# Patient Record
Sex: Male | Born: 1950
Health system: Southern US, Community
[De-identification: ages and names within clinical notes are randomized; demographics above are authoritative.]

## PROBLEM LIST (undated history)

## (undated) DIAGNOSIS — G4733 Obstructive sleep apnea (adult) (pediatric): Secondary | ICD-10-CM

## (undated) DIAGNOSIS — R001 Bradycardia, unspecified: Secondary | ICD-10-CM

## (undated) DIAGNOSIS — E669 Obesity, unspecified: Secondary | ICD-10-CM

## (undated) DIAGNOSIS — M545 Low back pain, unspecified: Secondary | ICD-10-CM

## (undated) DIAGNOSIS — H538 Other visual disturbances: Secondary | ICD-10-CM

## (undated) DIAGNOSIS — N529 Male erectile dysfunction, unspecified: Secondary | ICD-10-CM

## (undated) DIAGNOSIS — Z860100 Personal history of colon polyps, unspecified: Secondary | ICD-10-CM

## (undated) DIAGNOSIS — IMO0002 Reserved for concepts with insufficient information to code with codable children: Secondary | ICD-10-CM

## (undated) DIAGNOSIS — L989 Disorder of the skin and subcutaneous tissue, unspecified: Secondary | ICD-10-CM

## (undated) DIAGNOSIS — K5730 Diverticulosis of large intestine without perforation or abscess without bleeding: Secondary | ICD-10-CM

## (undated) DIAGNOSIS — G629 Polyneuropathy, unspecified: Secondary | ICD-10-CM

## (undated) DIAGNOSIS — N400 Enlarged prostate without lower urinary tract symptoms: Secondary | ICD-10-CM

## (undated) DIAGNOSIS — F419 Anxiety disorder, unspecified: Secondary | ICD-10-CM

## (undated) DIAGNOSIS — I2510 Atherosclerotic heart disease of native coronary artery without angina pectoris: Secondary | ICD-10-CM

## (undated) DIAGNOSIS — E785 Hyperlipidemia, unspecified: Secondary | ICD-10-CM

## (undated) DIAGNOSIS — I639 Cerebral infarction, unspecified: Secondary | ICD-10-CM

## (undated) DIAGNOSIS — Z720 Tobacco use: Secondary | ICD-10-CM

## (undated) DIAGNOSIS — M1990 Unspecified osteoarthritis, unspecified site: Secondary | ICD-10-CM

## (undated) DIAGNOSIS — I10 Essential (primary) hypertension: Secondary | ICD-10-CM

## (undated) DIAGNOSIS — G4730 Sleep apnea, unspecified: Secondary | ICD-10-CM

## (undated) DIAGNOSIS — R911 Solitary pulmonary nodule: Secondary | ICD-10-CM

## (undated) DIAGNOSIS — R9430 Abnormal result of cardiovascular function study, unspecified: Secondary | ICD-10-CM

## (undated) DIAGNOSIS — T7840XA Allergy, unspecified, initial encounter: Secondary | ICD-10-CM

## (undated) DIAGNOSIS — I4892 Unspecified atrial flutter: Secondary | ICD-10-CM

## (undated) DIAGNOSIS — I480 Paroxysmal atrial fibrillation: Secondary | ICD-10-CM

## (undated) DIAGNOSIS — F32A Depression, unspecified: Secondary | ICD-10-CM

## (undated) DIAGNOSIS — K219 Gastro-esophageal reflux disease without esophagitis: Secondary | ICD-10-CM

## (undated) DIAGNOSIS — F329 Major depressive disorder, single episode, unspecified: Secondary | ICD-10-CM

## (undated) DIAGNOSIS — R7303 Prediabetes: Secondary | ICD-10-CM

## (undated) DIAGNOSIS — E538 Deficiency of other specified B group vitamins: Secondary | ICD-10-CM

## (undated) HISTORY — DX: Unspecified atrial flutter: I48.92

## (undated) HISTORY — DX: Benign prostatic hyperplasia without lower urinary tract symptoms: N40.0

## (undated) HISTORY — DX: Low back pain: M54.5

## (undated) HISTORY — DX: Allergy, unspecified, initial encounter: T78.40XA

## (undated) HISTORY — DX: Atherosclerotic heart disease of native coronary artery without angina pectoris: I25.10

## (undated) HISTORY — DX: Personal history of colon polyps, unspecified: Z86.0100

## (undated) HISTORY — DX: Male erectile dysfunction, unspecified: N52.9

## (undated) HISTORY — PX: TONSILLECTOMY: SUR1361

## (undated) HISTORY — DX: Tobacco use: Z72.0

## (undated) HISTORY — DX: Paroxysmal atrial fibrillation: I48.0

## (undated) HISTORY — DX: Personal history of colonic polyps: Z86.010

## (undated) HISTORY — DX: Polyneuropathy, unspecified: G62.9

## (undated) HISTORY — DX: Major depressive disorder, single episode, unspecified: F32.9

## (undated) HISTORY — DX: Prediabetes: R73.03

## (undated) HISTORY — DX: Sleep apnea, unspecified: G47.30

## (undated) HISTORY — DX: Cerebral infarction, unspecified: I63.9

## (undated) HISTORY — DX: Disorder of the skin and subcutaneous tissue, unspecified: L98.9

## (undated) HISTORY — PX: OTHER SURGICAL HISTORY: SHX169

## (undated) HISTORY — DX: Abnormal result of cardiovascular function study, unspecified: R94.30

## (undated) HISTORY — DX: Deficiency of other specified B group vitamins: E53.8

## (undated) HISTORY — DX: Essential (primary) hypertension: I10

## (undated) HISTORY — DX: Bradycardia, unspecified: R00.1

## (undated) HISTORY — DX: Hyperlipidemia, unspecified: E78.5

## (undated) HISTORY — DX: Other visual disturbances: H53.8

## (undated) HISTORY — DX: Obesity, unspecified: E66.9

## (undated) HISTORY — PX: CLAVICLE SURGERY: SHX598

## (undated) HISTORY — DX: Reserved for concepts with insufficient information to code with codable children: IMO0002

## (undated) HISTORY — DX: Diverticulosis of large intestine without perforation or abscess without bleeding: K57.30

## (undated) HISTORY — DX: Gastro-esophageal reflux disease without esophagitis: K21.9

## (undated) HISTORY — DX: Unspecified osteoarthritis, unspecified site: M19.90

## (undated) HISTORY — DX: Low back pain, unspecified: M54.50

## (undated) HISTORY — DX: Obstructive sleep apnea (adult) (pediatric): G47.33

## (undated) HISTORY — DX: Anxiety disorder, unspecified: F41.9

## (undated) HISTORY — DX: Solitary pulmonary nodule: R91.1

## (undated) HISTORY — DX: Depression, unspecified: F32.A

---

## 1999-08-07 ENCOUNTER — Encounter: Payer: Self-pay | Admitting: Orthopedic Surgery

## 1999-08-07 ENCOUNTER — Ambulatory Visit (HOSPITAL_COMMUNITY): Admission: RE | Admit: 1999-08-07 | Discharge: 1999-08-07 | Payer: Self-pay | Admitting: Otolaryngology

## 1999-08-07 ENCOUNTER — Encounter: Payer: Self-pay | Admitting: Otolaryngology

## 1999-08-11 ENCOUNTER — Observation Stay (HOSPITAL_COMMUNITY): Admission: RE | Admit: 1999-08-11 | Discharge: 1999-08-12 | Payer: Self-pay | Admitting: Orthopedic Surgery

## 1999-08-11 ENCOUNTER — Encounter: Payer: Self-pay | Admitting: Orthopedic Surgery

## 1999-09-03 ENCOUNTER — Ambulatory Visit (HOSPITAL_COMMUNITY): Admission: RE | Admit: 1999-09-03 | Discharge: 1999-09-03 | Payer: Self-pay | Admitting: Pulmonary Disease

## 1999-09-03 ENCOUNTER — Encounter: Payer: Self-pay | Admitting: Pulmonary Disease

## 1999-12-18 ENCOUNTER — Inpatient Hospital Stay (HOSPITAL_COMMUNITY): Admission: AD | Admit: 1999-12-18 | Discharge: 1999-12-20 | Payer: Self-pay | Admitting: *Deleted

## 2001-05-20 ENCOUNTER — Ambulatory Visit (HOSPITAL_COMMUNITY): Admission: RE | Admit: 2001-05-20 | Discharge: 2001-05-20 | Payer: Self-pay | Admitting: Cardiology

## 2004-06-26 ENCOUNTER — Ambulatory Visit: Payer: Self-pay | Admitting: Pulmonary Disease

## 2004-06-26 ENCOUNTER — Ambulatory Visit: Payer: Self-pay | Admitting: Cardiology

## 2004-07-02 ENCOUNTER — Ambulatory Visit: Payer: Self-pay | Admitting: Cardiology

## 2004-07-07 ENCOUNTER — Ambulatory Visit: Payer: Self-pay | Admitting: Gastroenterology

## 2004-07-14 ENCOUNTER — Ambulatory Visit: Payer: Self-pay | Admitting: Gastroenterology

## 2004-10-29 ENCOUNTER — Ambulatory Visit: Payer: Self-pay | Admitting: Pulmonary Disease

## 2005-06-29 ENCOUNTER — Ambulatory Visit: Payer: Self-pay | Admitting: Cardiology

## 2005-07-06 ENCOUNTER — Ambulatory Visit: Payer: Self-pay | Admitting: Cardiology

## 2005-12-22 ENCOUNTER — Ambulatory Visit: Payer: Self-pay | Admitting: Pulmonary Disease

## 2005-12-30 ENCOUNTER — Ambulatory Visit: Payer: Self-pay | Admitting: Cardiology

## 2005-12-31 ENCOUNTER — Ambulatory Visit: Payer: Self-pay

## 2006-04-09 ENCOUNTER — Ambulatory Visit: Payer: Self-pay | Admitting: Pulmonary Disease

## 2006-06-14 ENCOUNTER — Ambulatory Visit: Payer: Self-pay | Admitting: Cardiology

## 2006-06-21 ENCOUNTER — Ambulatory Visit: Payer: Self-pay

## 2006-06-22 ENCOUNTER — Ambulatory Visit: Payer: Self-pay

## 2006-06-23 ENCOUNTER — Ambulatory Visit: Payer: Self-pay | Admitting: Cardiology

## 2006-06-23 LAB — CONVERTED CEMR LAB
AST: 25 units/L (ref 0–37)
Bilirubin, Direct: 0.2 mg/dL (ref 0.0–0.3)
HDL: 33 mg/dL — ABNORMAL LOW (ref >39.0)
LDL Cholesterol: 87 mg/dL (ref 0–99)
Total Protein: 6.5 g/dL (ref 6.0–8.3)
Triglycerides: 170 mg/dL — ABNORMAL HIGH (ref 0–149)
VLDL: 34 mg/dL (ref 0–40)

## 2007-04-13 ENCOUNTER — Ambulatory Visit: Payer: Self-pay | Admitting: Cardiology

## 2007-04-13 LAB — CONVERTED CEMR LAB
Basophils Absolute: 0 10*3/uL (ref 0.0–0.1)
Chloride: 106 meq/L (ref 96–112)
Cholesterol: 188 mg/dL (ref 0–200)
Eosinophils Absolute: 0.2 10*3/uL (ref 0.0–0.6)
GFR calc Af Amer: 99 mL/min
GFR calc non Af Amer: 82 mL/min
Glucose, Bld: 97 mg/dL (ref 70–99)
HCT: 50.8 % (ref 39.0–52.0)
Lymphocytes Relative: 22 % (ref 12.0–46.0)
MCHC: 34.5 g/dL (ref 30.0–36.0)
MCV: 92.5 fL (ref 78.0–100.0)
Neutro Abs: 5.6 10*3/uL (ref 1.4–7.7)
Neutrophils Relative %: 67.4 % (ref 43.0–77.0)
Potassium: 4.3 meq/L (ref 3.5–5.1)
RBC: 5.5 M/uL (ref 4.22–5.81)
Sodium: 141 meq/L (ref 135–145)
TSH: 1.56 microintl units/mL (ref 0.35–5.50)
Total CHOL/HDL Ratio: 6
Triglycerides: 170 mg/dL — ABNORMAL HIGH (ref 0–149)

## 2007-04-15 ENCOUNTER — Ambulatory Visit: Payer: Self-pay | Admitting: Pulmonary Disease

## 2007-04-21 ENCOUNTER — Ambulatory Visit: Payer: Self-pay | Admitting: Cardiology

## 2007-05-04 ENCOUNTER — Ambulatory Visit: Payer: Self-pay

## 2007-05-04 ENCOUNTER — Encounter: Payer: Self-pay | Admitting: Cardiology

## 2007-05-05 ENCOUNTER — Ambulatory Visit: Payer: Self-pay | Admitting: Cardiology

## 2007-05-13 ENCOUNTER — Ambulatory Visit: Payer: Self-pay | Admitting: Cardiology

## 2007-07-08 DIAGNOSIS — F341 Dysthymic disorder: Secondary | ICD-10-CM | POA: Insufficient documentation

## 2007-07-21 ENCOUNTER — Ambulatory Visit: Payer: Self-pay | Admitting: Pulmonary Disease

## 2007-07-23 DIAGNOSIS — F17200 Nicotine dependence, unspecified, uncomplicated: Secondary | ICD-10-CM

## 2007-07-23 DIAGNOSIS — R911 Solitary pulmonary nodule: Secondary | ICD-10-CM

## 2007-07-23 DIAGNOSIS — K5730 Diverticulosis of large intestine without perforation or abscess without bleeding: Secondary | ICD-10-CM | POA: Insufficient documentation

## 2007-07-23 DIAGNOSIS — D126 Benign neoplasm of colon, unspecified: Secondary | ICD-10-CM | POA: Insufficient documentation

## 2007-07-23 DIAGNOSIS — E291 Testicular hypofunction: Secondary | ICD-10-CM

## 2007-08-31 ENCOUNTER — Ambulatory Visit: Payer: Self-pay | Admitting: Cardiology

## 2008-02-08 ENCOUNTER — Ambulatory Visit: Payer: Self-pay | Admitting: Pulmonary Disease

## 2008-02-09 ENCOUNTER — Ambulatory Visit: Payer: Self-pay | Admitting: Pulmonary Disease

## 2008-02-09 ENCOUNTER — Ambulatory Visit: Payer: Self-pay | Admitting: Cardiology

## 2008-02-09 DIAGNOSIS — F528 Other sexual dysfunction not due to a substance or known physiological condition: Secondary | ICD-10-CM

## 2008-02-09 DIAGNOSIS — N400 Enlarged prostate without lower urinary tract symptoms: Secondary | ICD-10-CM | POA: Insufficient documentation

## 2008-02-09 LAB — CONVERTED CEMR LAB
AST: 21 units/L (ref 0–37)
Albumin: 3.8 g/dL (ref 3.5–5.2)
BUN: 23 mg/dL (ref 6–23)
Basophils Absolute: 0 10*3/uL (ref 0.0–0.1)
Basophils Relative: 0.6 % (ref 0.0–3.0)
Calcium: 9.4 mg/dL (ref 8.4–10.5)
Creatinine, Ser: 1 mg/dL (ref 0.4–1.5)
Direct LDL: 143.3 mg/dL
Eosinophils Absolute: 0.2 10*3/uL (ref 0.0–0.7)
Eosinophils Relative: 3.4 % (ref 0.0–5.0)
GFR calc non Af Amer: 82 mL/min
HCT: 49.5 % (ref 39.0–52.0)
HDL: 27.7 mg/dL — ABNORMAL LOW (ref >39.0)
Hemoglobin: 17.5 g/dL — ABNORMAL HIGH (ref 13.0–17.0)
MCHC: 35.3 g/dL (ref 30.0–36.0)
MCV: 91.1 fL (ref 78.0–100.0)
Monocytes Absolute: 0.5 10*3/uL (ref 0.1–1.0)
Neutro Abs: 2.4 10*3/uL (ref 1.4–7.7)
PSA: 0.31 ng/mL (ref 0.10–4.00)
RBC: 5.43 M/uL (ref 4.22–5.81)
Total Bilirubin: 0.8 mg/dL (ref 0.3–1.2)
WBC: 4.9 10*3/uL (ref 4.5–10.5)

## 2008-02-20 ENCOUNTER — Telehealth (INDEPENDENT_AMBULATORY_CARE_PROVIDER_SITE_OTHER): Payer: Self-pay | Admitting: *Deleted

## 2008-04-25 ENCOUNTER — Telehealth: Payer: Self-pay | Admitting: Pulmonary Disease

## 2008-05-30 ENCOUNTER — Telehealth (INDEPENDENT_AMBULATORY_CARE_PROVIDER_SITE_OTHER): Payer: Self-pay | Admitting: *Deleted

## 2008-05-30 ENCOUNTER — Emergency Department (HOSPITAL_BASED_OUTPATIENT_CLINIC_OR_DEPARTMENT_OTHER): Admission: EM | Admit: 2008-05-30 | Discharge: 2008-05-30 | Payer: Self-pay | Admitting: Emergency Medicine

## 2008-05-30 ENCOUNTER — Ambulatory Visit: Payer: Self-pay | Admitting: Radiology

## 2008-06-05 ENCOUNTER — Encounter: Admission: RE | Admit: 2008-06-05 | Discharge: 2008-06-05 | Payer: Self-pay | Admitting: Pulmonary Disease

## 2008-06-05 ENCOUNTER — Ambulatory Visit: Payer: Self-pay | Admitting: Pulmonary Disease

## 2008-06-06 ENCOUNTER — Telehealth: Payer: Self-pay | Admitting: Pulmonary Disease

## 2008-06-06 ENCOUNTER — Ambulatory Visit: Payer: Self-pay | Admitting: Pulmonary Disease

## 2008-06-06 LAB — CONVERTED CEMR LAB
Cholesterol: 186 mg/dL (ref 0–200)
LDL Cholesterol: 123 mg/dL — ABNORMAL HIGH (ref 0–99)
Total CHOL/HDL Ratio: 5.9
Triglycerides: 161 mg/dL — ABNORMAL HIGH (ref 0–149)

## 2008-08-03 ENCOUNTER — Telehealth (INDEPENDENT_AMBULATORY_CARE_PROVIDER_SITE_OTHER): Payer: Self-pay | Admitting: *Deleted

## 2008-08-03 DIAGNOSIS — M545 Low back pain: Secondary | ICD-10-CM | POA: Insufficient documentation

## 2008-08-06 ENCOUNTER — Ambulatory Visit: Payer: Self-pay | Admitting: Pulmonary Disease

## 2008-08-07 ENCOUNTER — Ambulatory Visit: Payer: Self-pay | Admitting: Pulmonary Disease

## 2008-08-09 ENCOUNTER — Encounter: Payer: Self-pay | Admitting: Pulmonary Disease

## 2008-08-23 ENCOUNTER — Encounter: Payer: Self-pay | Admitting: Cardiology

## 2008-08-23 ENCOUNTER — Ambulatory Visit: Payer: Self-pay | Admitting: Cardiology

## 2008-08-24 ENCOUNTER — Encounter: Payer: Self-pay | Admitting: Pulmonary Disease

## 2008-08-27 ENCOUNTER — Encounter: Payer: Self-pay | Admitting: Pulmonary Disease

## 2008-08-27 ENCOUNTER — Ambulatory Visit: Payer: Self-pay | Admitting: Cardiology

## 2008-09-03 ENCOUNTER — Ambulatory Visit: Payer: Self-pay | Admitting: Cardiology

## 2008-09-10 ENCOUNTER — Ambulatory Visit: Payer: Self-pay | Admitting: Cardiology

## 2008-09-10 ENCOUNTER — Encounter: Payer: Self-pay | Admitting: Cardiology

## 2008-09-10 DIAGNOSIS — G629 Polyneuropathy, unspecified: Secondary | ICD-10-CM

## 2008-09-10 DIAGNOSIS — R21 Rash and other nonspecific skin eruption: Secondary | ICD-10-CM | POA: Insufficient documentation

## 2008-09-26 ENCOUNTER — Ambulatory Visit: Payer: Self-pay | Admitting: Internal Medicine

## 2008-10-10 ENCOUNTER — Ambulatory Visit: Payer: Self-pay | Admitting: Cardiovascular Disease

## 2008-10-29 ENCOUNTER — Ambulatory Visit: Payer: Self-pay | Admitting: Cardiology

## 2008-11-06 ENCOUNTER — Encounter: Payer: Self-pay | Admitting: *Deleted

## 2008-11-06 ENCOUNTER — Ambulatory Visit: Payer: Self-pay | Admitting: Pulmonary Disease

## 2008-11-06 LAB — CONVERTED CEMR LAB
AST: 25 units/L (ref 0–37)
BUN: 25 mg/dL — ABNORMAL HIGH (ref 6–23)
Basophils Absolute: 0 10*3/uL (ref 0.0–0.1)
Bilirubin, Direct: 0.2 mg/dL (ref 0.0–0.3)
Calcium: 9.3 mg/dL (ref 8.4–10.5)
Cholesterol: 185 mg/dL (ref 0–200)
Creatinine, Ser: 1 mg/dL (ref 0.4–1.5)
Direct LDL: 118.4 mg/dL
GFR calc non Af Amer: 81.09 mL/min (ref >60)
Glucose, Bld: 92 mg/dL (ref 70–99)
HCT: 45.9 % (ref 39.0–52.0)
HDL: 31.9 mg/dL — ABNORMAL LOW (ref >39.00)
Lymphocytes Relative: 31.3 % (ref 12.0–46.0)
Lymphs Abs: 1.7 10*3/uL (ref 0.7–4.0)
Monocytes Relative: 6.8 % (ref 3.0–12.0)
Neutrophils Relative %: 58.2 % (ref 43.0–77.0)
PSA: 0.29 ng/mL (ref 0.10–4.00)
Platelets: 219 10*3/uL (ref 150.0–400.0)
RDW: 12.2 % (ref 11.5–14.6)
TSH: 0.98 microintl units/mL (ref 0.35–5.50)
Total Bilirubin: 1.2 mg/dL (ref 0.3–1.2)
Triglycerides: 253 mg/dL — ABNORMAL HIGH (ref 0.0–149.0)
VLDL: 50.6 mg/dL — ABNORMAL HIGH (ref 0.0–40.0)
WBC: 5.4 10*3/uL (ref 4.5–10.5)

## 2008-11-07 ENCOUNTER — Ambulatory Visit: Payer: Self-pay | Admitting: Cardiology

## 2008-11-07 LAB — CONVERTED CEMR LAB: Protime: 20.6

## 2008-11-12 LAB — CONVERTED CEMR LAB: Vit D, 25-Hydroxy: 18 ng/mL — ABNORMAL LOW (ref 30–89)

## 2008-11-15 ENCOUNTER — Telehealth (INDEPENDENT_AMBULATORY_CARE_PROVIDER_SITE_OTHER): Payer: Self-pay | Admitting: Radiology

## 2008-11-16 ENCOUNTER — Encounter: Payer: Self-pay | Admitting: Cardiology

## 2008-11-19 ENCOUNTER — Encounter: Payer: Self-pay | Admitting: Cardiology

## 2008-11-19 ENCOUNTER — Ambulatory Visit: Payer: Self-pay

## 2008-12-07 ENCOUNTER — Ambulatory Visit: Payer: Self-pay | Admitting: Cardiology

## 2008-12-12 ENCOUNTER — Encounter: Payer: Self-pay | Admitting: *Deleted

## 2008-12-20 ENCOUNTER — Encounter: Payer: Self-pay | Admitting: Cardiology

## 2008-12-20 ENCOUNTER — Encounter: Payer: Self-pay | Admitting: Pulmonary Disease

## 2009-01-04 ENCOUNTER — Encounter (INDEPENDENT_AMBULATORY_CARE_PROVIDER_SITE_OTHER): Payer: Self-pay | Admitting: Cardiology

## 2009-01-04 ENCOUNTER — Ambulatory Visit: Payer: Self-pay | Admitting: Cardiology

## 2009-01-04 LAB — CONVERTED CEMR LAB: POC INR: 2.9

## 2009-01-31 ENCOUNTER — Ambulatory Visit: Payer: Self-pay | Admitting: Cardiovascular Disease

## 2009-01-31 LAB — CONVERTED CEMR LAB: POC INR: 2.6

## 2009-02-28 ENCOUNTER — Ambulatory Visit: Payer: Self-pay | Admitting: Internal Medicine

## 2009-03-28 ENCOUNTER — Ambulatory Visit: Payer: Self-pay | Admitting: Cardiovascular Disease

## 2009-03-28 LAB — CONVERTED CEMR LAB: POC INR: 2.5

## 2009-04-12 ENCOUNTER — Encounter: Payer: Self-pay | Admitting: Pulmonary Disease

## 2009-04-25 ENCOUNTER — Ambulatory Visit: Payer: Self-pay | Admitting: Cardiology

## 2009-04-25 LAB — CONVERTED CEMR LAB: POC INR: 2.2

## 2009-05-11 ENCOUNTER — Encounter: Payer: Self-pay | Admitting: Pulmonary Disease

## 2009-05-20 ENCOUNTER — Encounter: Payer: Self-pay | Admitting: Adult Health

## 2009-05-20 ENCOUNTER — Telehealth (INDEPENDENT_AMBULATORY_CARE_PROVIDER_SITE_OTHER): Payer: Self-pay | Admitting: *Deleted

## 2009-05-20 ENCOUNTER — Ambulatory Visit: Payer: Self-pay | Admitting: Pulmonary Disease

## 2009-05-20 DIAGNOSIS — E559 Vitamin D deficiency, unspecified: Secondary | ICD-10-CM | POA: Insufficient documentation

## 2009-05-22 LAB — CONVERTED CEMR LAB
ALT: 24 U/L
AST: 22 U/L
Albumin: 4 g/dL
Alkaline Phosphatase: 47 U/L
Bilirubin, Direct: 0.1 mg/dL
Cholesterol: 183 mg/dL
Direct LDL: 111.8 mg/dL
HDL: 31.2 mg/dL — ABNORMAL LOW
Total Bilirubin: 1 mg/dL
Total CHOL/HDL Ratio: 6
Total Protein: 6.8 g/dL
Triglycerides: 248 mg/dL — ABNORMAL HIGH
VLDL: 49.6 mg/dL — ABNORMAL HIGH
Vit D, 25-Hydroxy: 22 ng/mL — ABNORMAL LOW

## 2009-05-23 ENCOUNTER — Encounter: Payer: Self-pay | Admitting: Cardiology

## 2009-05-24 ENCOUNTER — Ambulatory Visit: Payer: Self-pay | Admitting: Cardiology

## 2009-05-24 ENCOUNTER — Encounter (INDEPENDENT_AMBULATORY_CARE_PROVIDER_SITE_OTHER): Payer: Self-pay | Admitting: Cardiology

## 2009-06-12 ENCOUNTER — Encounter: Payer: Self-pay | Admitting: Cardiology

## 2009-06-21 ENCOUNTER — Telehealth: Payer: Self-pay | Admitting: Cardiology

## 2009-06-21 ENCOUNTER — Ambulatory Visit: Payer: Self-pay | Admitting: Cardiology

## 2009-07-12 ENCOUNTER — Ambulatory Visit: Payer: Self-pay | Admitting: Cardiovascular Disease

## 2009-07-17 ENCOUNTER — Encounter: Payer: Self-pay | Admitting: Pulmonary Disease

## 2009-07-26 ENCOUNTER — Ambulatory Visit: Payer: Self-pay | Admitting: Cardiology

## 2009-07-26 LAB — CONVERTED CEMR LAB: POC INR: 2.6

## 2009-07-30 ENCOUNTER — Telehealth: Payer: Self-pay | Admitting: Pulmonary Disease

## 2009-08-12 ENCOUNTER — Telehealth: Payer: Self-pay | Admitting: Pulmonary Disease

## 2009-08-13 ENCOUNTER — Ambulatory Visit: Payer: Self-pay | Admitting: Adult Health

## 2009-08-14 LAB — CONVERTED CEMR LAB: Vit D, 25-Hydroxy: 27 ng/mL — ABNORMAL LOW (ref 30–89)

## 2009-08-15 ENCOUNTER — Encounter: Payer: Self-pay | Admitting: Adult Health

## 2009-08-23 ENCOUNTER — Ambulatory Visit: Payer: Self-pay | Admitting: Cardiovascular Disease

## 2009-08-23 LAB — CONVERTED CEMR LAB: POC INR: 2.3

## 2009-09-20 ENCOUNTER — Ambulatory Visit: Payer: Self-pay | Admitting: Internal Medicine

## 2009-09-20 LAB — CONVERTED CEMR LAB: POC INR: 2.2

## 2009-10-18 ENCOUNTER — Ambulatory Visit: Payer: Self-pay | Admitting: Cardiology

## 2009-10-18 LAB — CONVERTED CEMR LAB: POC INR: 2

## 2009-11-08 ENCOUNTER — Ambulatory Visit: Payer: Self-pay | Admitting: Cardiology

## 2009-11-08 ENCOUNTER — Ambulatory Visit: Payer: Self-pay | Admitting: Cardiovascular Disease

## 2009-11-18 ENCOUNTER — Ambulatory Visit: Payer: Self-pay | Admitting: Pulmonary Disease

## 2009-12-03 ENCOUNTER — Ambulatory Visit: Payer: Self-pay | Admitting: Pulmonary Disease

## 2009-12-04 LAB — CONVERTED CEMR LAB
Total CHOL/HDL Ratio: 5
Vit D, 25-Hydroxy: 35 ng/mL (ref 30–89)
Vitamin B-12: 606 pg/mL (ref 211–911)

## 2009-12-06 ENCOUNTER — Ambulatory Visit: Payer: Self-pay | Admitting: Cardiology

## 2009-12-06 LAB — CONVERTED CEMR LAB: POC INR: 2.5

## 2010-01-03 ENCOUNTER — Ambulatory Visit: Payer: Self-pay | Admitting: Cardiology

## 2010-01-31 ENCOUNTER — Ambulatory Visit: Payer: Self-pay | Admitting: Cardiology

## 2010-02-28 ENCOUNTER — Ambulatory Visit: Payer: Self-pay | Admitting: Internal Medicine

## 2010-03-21 ENCOUNTER — Encounter: Payer: Self-pay | Admitting: Cardiology

## 2010-03-28 ENCOUNTER — Ambulatory Visit: Payer: Self-pay | Admitting: Internal Medicine

## 2010-03-28 LAB — CONVERTED CEMR LAB: POC INR: 1.8

## 2010-04-09 ENCOUNTER — Telehealth: Payer: Self-pay | Admitting: Cardiology

## 2010-04-22 ENCOUNTER — Telehealth (INDEPENDENT_AMBULATORY_CARE_PROVIDER_SITE_OTHER): Payer: Self-pay | Admitting: *Deleted

## 2010-04-25 ENCOUNTER — Ambulatory Visit: Payer: Self-pay | Admitting: Cardiology

## 2010-05-09 ENCOUNTER — Ambulatory Visit: Payer: Self-pay | Admitting: Cardiology

## 2010-05-12 ENCOUNTER — Encounter: Payer: Self-pay | Admitting: Cardiology

## 2010-05-16 ENCOUNTER — Telehealth: Payer: Self-pay | Admitting: Cardiology

## 2010-05-16 LAB — CONVERTED CEMR LAB
ALT: 21 units/L (ref 0–53)
Basophils Absolute: 0 10*3/uL (ref 0.0–0.1)
Basophils Relative: 0.8 % (ref 0.0–3.0)
Eosinophils Relative: 2.8 % (ref 0.0–5.0)
GFR calc non Af Amer: 68.67 mL/min (ref >60)
Glucose, Bld: 101 mg/dL — ABNORMAL HIGH (ref 70–99)
HCT: 46.5 % (ref 39.0–52.0)
HDL: 30.4 mg/dL — ABNORMAL LOW (ref >39.00)
Hemoglobin: 16.2 g/dL (ref 13.0–17.0)
Lymphs Abs: 1.8 10*3/uL (ref 0.7–4.0)
Monocytes Relative: 8.8 % (ref 3.0–12.0)
Neutro Abs: 3.4 10*3/uL (ref 1.4–7.7)
Potassium: 4.5 meq/L (ref 3.5–5.1)
RDW: 12.5 % (ref 11.5–14.6)
Sodium: 140 meq/L (ref 135–145)
Total Bilirubin: 0.8 mg/dL (ref 0.3–1.2)
Total CHOL/HDL Ratio: 6
Triglycerides: 242 mg/dL — ABNORMAL HIGH (ref 0.0–149.0)

## 2010-05-19 ENCOUNTER — Ambulatory Visit: Payer: Self-pay | Admitting: Pulmonary Disease

## 2010-05-20 LAB — CONVERTED CEMR LAB: Testosterone: 420.42 ng/dL (ref 350.00–890.00)

## 2010-05-23 ENCOUNTER — Ambulatory Visit: Payer: Self-pay | Admitting: Cardiology

## 2010-05-28 ENCOUNTER — Telehealth: Payer: Self-pay | Admitting: Pulmonary Disease

## 2010-06-20 ENCOUNTER — Ambulatory Visit: Admission: RE | Admit: 2010-06-20 | Discharge: 2010-06-20 | Payer: Self-pay | Source: Home / Self Care

## 2010-06-20 LAB — CONVERTED CEMR LAB: POC INR: 2.1

## 2010-06-29 ENCOUNTER — Encounter: Payer: Self-pay | Admitting: Pulmonary Disease

## 2010-07-06 LAB — CONVERTED CEMR LAB
AST: 25 units/L (ref 0–37)
Albumin: 4.2 g/dL (ref 3.5–5.2)
Alkaline Phosphatase: 48 units/L (ref 39–117)
Bilirubin, Direct: 0.1 mg/dL (ref 0.0–0.3)
Calcium: 10 mg/dL (ref 8.4–10.5)
GFR calc non Af Amer: 65.75 mL/min (ref >60)
HDL: 33 mg/dL — ABNORMAL LOW (ref >39.00)
Potassium: 5.5 meq/L — ABNORMAL HIGH (ref 3.5–5.1)
Sodium: 144 meq/L (ref 135–145)
Total CHOL/HDL Ratio: 5
VLDL: 29.2 mg/dL (ref 0.0–40.0)

## 2010-07-08 NOTE — Assessment & Plan Note (Signed)
Summary: 6 months/apc   Primary Care Provider:  Kriste Basque, MD  CC:  Yearly ROV & CPX....  History of Present Illness: 60 y/o WM here for a follow up visit... he has mult med problems as noted...    ~  Dec09:  [facility] w/ confusion episode- he said it was like he couldn't make a decision and it was concerning to him... he denied weakness, numbness, tingling, speech probs etc... the ER eval was totally neg- neg CT Brain, norm labs, norm physical per the ER doc... it was their opinion that he might have had a TIA and rec to start Plavix and f/u w/ me in the office... the pt refused to fill the Plavix as he didn't think this was a TIA- rather he thought related to stress- work, son-in-law died w/ lymphoma, wife w/ musc dystrophy variant, etc... we decided to get an MRI Brain- done 12/29 & showed min atrophy & sm vessel dis, no acute infarct... rec- continue ASA 325mg /d.   ~  Mar10:  he reports no further confusion episodes... now c/o LBP for 2months- no known trauma, but he's been walking for exercise, & recently lifted 50lb bag of sand w/ incr pain after that... LumbarXRay 3/1 showed mild scoliosis, DDD w/ osteophytes & facet degen changes... we called in SOMA 350Tid, HEAT, & VICODIN Tid Prn... he states pain sl better, requests referral to DrGraves- seen 3/10 & he agreed w/ the Soma/ Vicodin and added phys therapy- now improved.  ~  Jun10:  had f/u w/ [name] for Cards w/ incr episodes of PAF- COUMADIN started via [facility], and METOPROLOL & DGOXIN started as well...  f/u Myoview pending (never done)...  he also had an an appt w/ [name] for Neuro- he agreed w/ Rx and also started w/u for peipheral neuropathy... B12 level was borderline at 273 (211-911) and he has started taking B12 orally as supplement.   ~  November 18, 2009:  Yearly ROV & doing satis he says... notes some sinus congestion, drainage, no discoloration, blood, fever, HA, etc... we discussed Rx w/ Mucinex,  Saline, etc... he saw [facility]/11 & he was in [place] on EKG- remains on Coumadin etc w/ PAF... BP controlled on meds, but unfotunately he hasn't quit smoking, although he has lost 27# on diet... his Simva80 was switched to Crestor20 & due for f/u labs...    Current Problems:   CIGARETTE SMOKER (ICD-305.1) - still smokes  ~1/2 ppd... we have prev discussed smoking cessation many times but he is not motivated to quit... "I'm working on it".  PULMONARY NODULE (ICD-518.89) - CXR's have been normal... CTChest 12/02 w/ stable small nodules that are likely granulomas...   ~  CXR 9/09 showed stable- clear lungs and no nodules seen on plain film.  ~  CXR 6/11 showed COPD/ emphysema, no nodules, NAD...  HYPERTENSION (ICD-401.9) - controlled w/ TOPROL XL 50mg /d, & DILTIAZEM 180mg /d... BP= 112/72 and doing well.. denies HA, fatigue, visual changes, CP, dizziness, syncope, dyspnea, edema, etc...  CAD (ICD-414.00) - followed by [name] on ASA daily... seen 9/09- hx of SVT in 2001& PAF more recently... palpit and BP controlled w/ the Cardizem... had NuclearStressTest 1/08= normal without ischemia & EF=60%... cath 2001 w/ 3 vessel CAD- s/p PTCA/stent... he denies CP, change in SOB, edema, etc... exerc= yard, some walking prior to his back pain prob.  CARDIAC ARRHYTHMIA (ICD-427.9), & ATRIAL FIBRILLATION, PAROXYSMAL (ICD-427.31)- hx SVT and PAF in past... [name] indicated  that his [place] score wasn't high enough for Coumadin therapy prev, but it was started 3/10 w/ incr PAF & ?TIA.Marland Kitchen. now on METOPROLOL ER 50mg /d, DIGOXIN 0.25mg /d, COUMADIN via CC...  HYPERLIPIDEMIA (ICD-272.4) - now on CRESTOR 20mg /d, FENOFIBRATE 160mg /d, + "diet" therapy... prev on Zetia but stopped by Cards- "it doesn't help" & Simva80 stopped w/ new FDA guidelines.  ~  FLP 11/08 showed TChol 188, TG 170, HDL 31, LDL 123...  ~  FLP 9/09 showed TChol 224, TG 176, HDL 28, LDL 143... rec- incr Simvast 80/d & better diet...  ~  FLP 12/09 showed  TChol 186, TG 161, HDL 31, LDL 123... rec contin Simva80 + [id number] & get wt down.  ~  FLP 6/10 showed TChol 185, TG 253, HDL 32, LDL 118... rec> needs better diet + exercise, refer to Lipid Clinic.  ~  FLP at work 2/11 (on Simva80+[id number]) showed TChol 184, TG 304, HDL 33, LDL 90  ~  5/11: changed from Simva80 to CRESTOR 20mg /d...  OBESITY (ICD-278.00) - we discussed diet and exercise program in detail...  ~  9/09:  his weight is down 12# today to 322#.Marland Kitchen.   ~  weight 12/09 = 319#  ~  weight 3/10 = 315#  ~  weight 6/10 = 309#  ~  weight 6/11 = 282#... keep up the great work...  GERD - on OMEPRAZOLE 20mg /d... DIVERTICULOSIS OF COLON (ICD-562.10), & COLONIC POLYPS (ICD-211.3) - last colonoscopy was 2/06 by DrStark showing divertics, and several 2-82mm polyps (hyperplastic)... f/u planned 2014...  BENIGN PROSTATIC HYPERTROPHY, WITH OBSTRUCTION (ICD-600.01),  Hx of TESTOSTERONE DEFICIENCY (ICD-257.2),  & ERECTILE DYSFUNCTION (ICD-302.72) - followed by Urology- [name] & [name]...  BACK PAIN, LUMBAR (ICD-724.2) - Ortho eval by DrGraves w/ PT recommended & improved... he uses ETODOLAC 400mg Bid Prn, SOMA 350mg Tid Prn, & VICODIN Prn...  PERIPHERAL NEUROPATHY, LOWER EXTREMITY (ICD-355.8) - eval 3/10 by Autumn Patty w/ prob peripheral neuropathy found ("I'm clumsey, I fall")... labs 3/10 showed borderline B 12 level = 273 by [name] & Rx'd w/ Vit B 12 542mcg/d by mouth.  ~  labs here 6/10 showed B 12 level = 584  ANXIETY DEPRESSION (ICD-300.4) - on ALPRAZOLAM 0.5mg  Tid Prn...  ? of B12 DEFICIENCY (ICD-266.2) & VITAMIN D DEFICIENCY (ICD-268.9) - as noted Vit B12 was borderline per [name] 3/10 (273) and he was started on oral B12 supplement  ~559mcg/d... repeat B12 level 6/10 = 584, improved... Vit D level = 18 6/10 & pt rec to start Vit D supplement w/ repeat Vit D level = 27 in Mar11... OK to continue 1000 u daily...   Preventive Screening-Counseling & Management  Alcohol-Tobacco     Smoking Status:  current     Packs/Day: 1/2 ppd  Allergies: 1)  ! * Tussionex  Comments:  Nurse/Medical Assistant: The patient's medications and allergies were reviewed with the patient and were updated in the Medication and Allergy Lists.  Past History:  Past Medical History: CIGARETTE SMOKER (ICD-305.1) PULMONARY NODULE (ICD-518.89) HYPERTENSION (ICD-401.9) CAD (ICD-414.00) CARDIAC ARRHYTHMIA (ICD-427.9) ATRIAL FIBRILLATION, PAROXYSMAL (ICD-427.31) HYPERLIPIDEMIA (ICD-272.4) OBESITY (ICD-278.00) DIVERTICULOSIS OF COLON (ICD-562.10) COLONIC POLYPS (ICD-211.3) BENIGN PROSTATIC HYPERTROPHY, WITH OBSTRUCTION (ICD-600.01) Hx of TESTOSTERONE DEFICIENCY (ICD-257.2) ERECTILE DYSFUNCTION (ICD-302.72) BACK PAIN, LUMBAR (ICD-724.2) PERIPHERAL NEUROPATHY, LOWER EXTREMITY (ICD-355.8) ANXIETY DEPRESSION (ICD-300.4) ? of B12 DEFICIENCY (ICD-266.2) VITAMIN D DEFICIENCY (ICD-268.9)  EF 60%...echo..04/2007 Blurred vision.Marland Kitchenepisode with confusion...evaluated over time by Dr. Sandria Manly Vitamin D treatment B12 treatment  Past Surgical History: S/P Tonsillectomy S/P Collar Bone fx S/P Right Arm fx  Family  History: Reviewed history from 02/09/2008 and no changes required. mother deceased age 53 from stroke/alzheimers father deceased age 39 from stroke and heart failure 1 sibling alive age 26  hx of dementia,blood clots 1 sibling deceased age 92  hx of bil knee replacement  Social History: Reviewed history from 05/20/2009 and no changes required. current smoker---1/4 ppd exposed to second hand smoke exercise----yard work several times per week caffeine use:  2 cups per day etoh---1 or less per day married 3 step-childrenPacks/Day:  1/2 ppd  Review of Systems       The patient complains of dyspnea on exertion, back pain, joint pain, and muscle weakness.  The patient denies fever, chills, sweats, anorexia, fatigue, weakness, malaise, weight loss, sleep disorder, blurring, diplopia, eye irritation, eye  discharge, vision loss, eye pain, photophobia, earache, ear discharge, tinnitus, decreased hearing, nasal congestion, nosebleeds, sore throat, hoarseness, chest pain, palpitations, syncope, orthopnea, PND, peripheral edema, cough, dyspnea at rest, excessive sputum, hemoptysis, wheezing, pleurisy, nausea, vomiting, diarrhea, constipation, change in bowel habits, abdominal pain, melena, hematochezia, jaundice, gas/bloating, indigestion/heartburn, dysphagia, odynophagia, dysuria, hematuria, urinary frequency, urinary hesitancy, nocturia, incontinence, joint swelling, muscle cramps, stiffness, arthritis, sciatica, restless legs, leg pain at night, leg pain with exertion, rash, itching, dryness, suspicious lesions, paralysis, paresthesias, seizures, tremors, vertigo, transient blindness, frequent falls, frequent headaches, difficulty walking, depression, anxiety, memory loss, confusion, cold intolerance, heat intolerance, polydipsia, polyphagia, polyuria, unusual weight change, abnormal bruising, bleeding, enlarged lymph nodes, urticaria, allergic rash, hay fever, and recurrent infections.    Vital Signs:  Patient profile:   60 year old male Height:      78 inches Weight:      282 pounds BMI:     32.71 O2 Sat:      97 % on Room air Temp:     97.0 degrees F oral Pulse rate:   45 / minute BP sitting:   112 / 72  (right arm) Cuff size:   regular  Vitals Entered By: Randell Loop CMA (November 18, 2009 10:17 AM)  O2 Sat at Rest %:  97 O2 Flow:  Room air CC: Yearly ROV & CPX... Is Patient Diabetic? No Pain Assessment Patient in pain? no      Comments meds updated today with pt   Physical Exam  Additional Exam:  WD, Obese, 60 y/o WM in NAD... he is 6'6" tall and weighs 282lbs GENERAL:  Alert & oriented; pleasant & cooperative... HEENT:  [place]/AT, EOM-wnl, PERRLA, EACs-clear, TMs-wnl, NOSE-clear, THROAT-clear & wnl. NECK:  Supple w/ fairROM; no JVD; normal carotid impulses w/o bruits; no thyromegaly or  nodules palpated; no lymphadenopathy. CHEST:  Clear to P & A; without wheezes/ rales/ or rhonchi. HEART:  Regular Rhythm; gr 1/6 SEM without rubs or gallops heard... ABDOMEN:  Soft & nontender; normal bowel sounds; no organomegaly or masses detected. EXT: without deformities, mod arthritic changes; no varicose veins/ +venous insuffic/ tr edema.  NEURO:  CN's intact;  no focal neuro deficits x sl decr sensation in LE's & wide based gait... DERM:  No lesions noted; no rash etc...    CXR  Procedure date:  11/18/2009  Findings:      CHEST - 2 VIEW Comparison: 05/30/2008   Findings: Stable tiny bilateral lung nodules were reported on the 05/20/2001 CT scan.  No nodules are appreciated on this exam. Emphysematous changes in the upper lobes particularly on the left. Normal cardiomediastinal silhouette.   IMPRESSION: Stable chest. COPD/emphysema.   Read By:  Jonne Ply,  Judie Petit.D.  MISC. Report  Procedure date:  11/18/2009  Findings:      DATA REVIEWED:  CXR today... Labs from work 07/19/09... [name] OV 11/08/09... he will f/u in our LAB for FASTING Lipids and PSA...  SN   Impression & Recommendations:  Problem # 1:  CIGARETTE SMOKER (ICD-305.1) Must quit all smoking- discussed smoking cessation strategies and offered Chantix etc... pt refuses and states that he's working on it on his own... Orders: T-2 View CXR (71020TC)  Problem # 2:  PULMONARY NODULE (ICD-518.89) CXR is clear- no nodules, he has underlying COPD/ Emphysema and needs to quit all smoking ASAP!!! Orders: T-2 View CXR (71020TC)  Problem # 3:  HYPERTENSION (ICD-401.9) BP controlled on meds... His updated medication list for this problem includes:    Metoprolol Succinate 50 Mg Xr24h-tab (Metoprolol succinate) .Marland Kitchen... Take one tablet by mouth daily    Diltiazem Hcl Cr 180 Mg Cp24 (Diltiazem hcl) .Marland Kitchen... Take 1 tab by mouth once daily  Problem # 4:  CAD (ICD-414.00) Follwed by [name] & felt to be stable... he  still needs to address the risk factors of smoking, weight, exercise... His updated medication list for this problem includes:    Aspirin 81 Mg Tbec (Aspirin) .Marland Kitchen... Take one tablet by mouth daily    Metoprolol Succinate 50 Mg Xr24h-tab (Metoprolol succinate) .Marland Kitchen... Take one tablet by mouth daily    Diltiazem Hcl Cr 180 Mg Cp24 (Diltiazem hcl) .Marland Kitchen... Take 1 tab by mouth once daily  Problem # 5:  ATRIAL FIBRILLATION, PAROXYSMAL (ICD-427.31) Stable on meds and Coumadin per Cards/ [name]... His updated medication list for this problem includes:    Warfarin Sodium 5 Mg Tabs (Warfarin sodium) ..... Use as directed by anticoagulation clinic    Aspirin 81 Mg Tbec (Aspirin) .Marland Kitchen... Take one tablet by mouth daily    Metoprolol Succinate 50 Mg Xr24h-tab (Metoprolol succinate) .Marland Kitchen... Take one tablet by mouth daily    Diltiazem Hcl Cr 180 Mg Cp24 (Diltiazem hcl) .Marland Kitchen... Take 1 tab by mouth once daily    Digoxin 0.25 Mg Tabs (Digoxin) .Marland Kitchen... Take one tablet by mouth daily  Problem # 6:  HYPERLIPIDEMIA (ICD-272.4) Simva80 changed to Cres20 & f/u FLP pending... His updated medication list for this problem includes:    Crestor 20 Mg Tabs (Rosuvastatin calcium) .Marland Kitchen... Take one tablet by mouth daily.    Fenofibrate 160 Mg Tabs (Fenofibrate) .Marland Kitchen... Take 1 tab daily...  Problem # 7:  OBESITY (ICD-278.00) He has lost 28# on diet... keep up the good work!  Problem # 8:  BACK PAIN, LUMBAR (ICD-724.2) Follwed by Ortho- DrGraves... His updated medication list for this problem includes:    Aspirin 81 Mg Tbec (Aspirin) .Marland Kitchen... Take one tablet by mouth daily    Soma 350 Mg Tabs (Carisoprodol) .Marland Kitchen... Take 1 tablet by mouth three times a day - not to exceed 3/d...    Etodolac 400 Mg Tabs (Etodolac) .Marland Kitchen... Take 1 tab by mouth two times a day w/ food, as needed for arthritis pain...    Vicodin 5-500 Mg Tabs (Hydrocodone-acetaminophen) .Marland Kitchen... Take 1 tab by mouth every 6 hours as needed for pain- not to exceed 3/d...  Problem # 9:   ANXIETY DEPRESSION (ICD-300.4) Stable on Prn Alprazolam Rx...  Complete Medication List: 1)  Warfarin Sodium 5 Mg Tabs (Warfarin sodium) .... Use as directed by anticoagulation clinic 2)  Aspirin 81 Mg Tbec (Aspirin) .... Take one tablet by mouth daily 3)  Metoprolol Succinate 50 Mg Xr24h-tab (Metoprolol succinate) .... Take one  tablet by mouth daily 4)  Diltiazem Hcl Cr 180 Mg Cp24 (Diltiazem hcl) .... Take 1 tab by mouth once daily 5)  Digoxin 0.25 Mg Tabs (Digoxin) .... Take one tablet by mouth daily 6)  Crestor 20 Mg Tabs (Rosuvastatin calcium) .... Take one tablet by mouth daily. 7)  Fenofibrate 160 Mg Tabs (Fenofibrate) .... Take 1 tab daily.Marland KitchenMarland Kitchen 8)  Omeprazole 20 Mg Tbec (Omeprazole) .Marland Kitchen.. 1 by mouth once daily 9)  Soma 350 Mg Tabs (Carisoprodol) .... Take 1 tablet by mouth three times a day - not to exceed 3/d... 10)  Etodolac 400 Mg Tabs (Etodolac) .... Take 1 tab by mouth two times a day w/ food, as needed for arthritis pain... 11)  Vicodin 5-500 Mg Tabs (Hydrocodone-acetaminophen) .... Take 1 tab by mouth every 6 hours as needed for pain- not to exceed 3/d... 12)  Calcium Carbonate-vitamin D 600-400 Mg-unit Tabs (Calcium carbonate-vitamin d) .... Take 1 tablet by mouth two times a day 13)  Multivitamins Tabs (Multiple vitamin) .... Once daily 14)  Vitamin B-12 500 Mcg Tabs (Cyanocobalamin) .... Daily 15)  Vitamin D 1000 Unit Caps (Cholecalciferol) .... Take 1 capsule by mouth once a day 16)  Alprazolam 0.5 Mg Tabs (Alprazolam) .... Take 1 tablet by mouth three times a day as needed  Patient Instructions: 1)  Today we updated your med list- see below.... 2)  Continue your current meds the same for now... Great job w/ weight reduction!!! 3)  For your sinuses:  Try ZYRTEK OTC in the AM;  then [facility] 1,200mg  two times a day w/ plenty of water;  and SALINE nasal spray every 1-2 H during the day... 4)  Today we did your follow up CXR.Marland KitchenMarland Kitchen 5)  Please return to our lab one morning this week  for your FASTING blood work... 6)  Then please call the "phone tree" in a few days for your lab results.Marland KitchenMarland Kitchen 7)  Call for any problems... 8)  Please schedule a follow-up appointment in 6 months.   Orders Added: 1)  Est. Patient Level IV [29562] 2)  T-2 View CXR [[id number]]

## 2010-07-08 NOTE — Medication Information (Signed)
Summary: rov.m,p  Anticoagulant Therapy  Managed by: Bethena Midget, RN, BSN Referring MD: Willa Rough MD PCP: Kriste Basque, MD Supervising MD: Eden Emms MD, Theron Arista Indication 1: Atrial Fibrillation (ICD-427.31) Lab Used: [facility] Site: [facility] INR POC 1.9 INR RANGE 2 - 3  Dietary changes: no    Health status changes: no    Bleeding/hemorrhagic complications: no    Recent/future hospitalizations: no    Any changes in medication regimen? yes       Details: Switched from simvastin to crestor  Recent/future dental: no  Any missed doses?: no       Is patient compliant with meds? yes       Allergies: 1)  ! * Tussionex  Anticoagulation Management History:      The patient is taking warfarin and comes in today for a routine follow up visit.  Negative risk factors for bleeding include an age less than 62 years old.  The bleeding index is 'low risk'.  Positive CHADS2 values include History of HTN.  Negative CHADS2 values include Age > 62 years old.  The start date was 08/23/2008.  Anticoagulation responsible provider: Eden Emms MD, Theron Arista.  INR POC: 1.9.  Cuvette Lot#: [id number].  Exp: 09/2010.    Anticoagulation Management Assessment/Plan:      The patient's current anticoagulation dose is Warfarin sodium 5 mg tabs: Use as directed by Anticoagulation Clinic.  The target INR is 2 - 3.  The next INR is due 07/26/2009.  Anticoagulation instructions were given to patient.  Results were reviewed/authorized by Bethena Midget, RN, BSN.  He was notified by Bethena Midget, RN, BSN.         Prior Anticoagulation Instructions: INR 1.6  Take 1.5 tablets today then continue same dose of 1 tablet daily except 0.5 tablet on Thursdays. Recheck in 3 weeks.      Current Anticoagulation Instructions: INR 1.9 Today take 7.5mg s then change dose to 5mg s everyday. Recheck in 2-3 weeks.

## 2010-07-08 NOTE — Medication Information (Signed)
Summary: rov/sp  Anticoagulant Therapy  Managed by: Weston Brass, PharmD Referring MD: Willa Rough MD PCP: Kriste Basque, MD Supervising MD: Graciela Husbands MD, Viviann Spare Indication 1: Atrial Fibrillation (ICD-427.31) Lab Used: [facility] Site: [facility] INR POC 2.0 INR RANGE 2 - 3  Dietary changes: no    Health status changes: no    Bleeding/hemorrhagic complications: no    Recent/future hospitalizations: no    Any changes in medication regimen? no    Recent/future dental: no  Any missed doses?: no       Is patient compliant with meds? yes       Allergies: 1)  ! * Tussionex  Anticoagulation Management History:      The patient is taking warfarin and comes in today for a routine follow up visit.  Negative risk factors for bleeding include an age less than 49 years old.  The bleeding index is 'low risk'.  Positive CHADS2 values include History of HTN.  Negative CHADS2 values include Age > 64 years old.  The start date was 08/23/2008.  Anticoagulation responsible provider: Graciela Husbands MD, Viviann Spare.  INR POC: 2.0.  Cuvette Lot#: [id number].  Exp: 01/2011.    Anticoagulation Management Assessment/Plan:      The patient's current anticoagulation dose is Warfarin sodium 5 mg tabs: Use as directed by Anticoagulation Clinic.  The target INR is 2 - 3.  The next INR is due 11/08/2009.  Anticoagulation instructions were given to patient.  Results were reviewed/authorized by Weston Brass, PharmD.  He was notified by Weston Brass PharmD.         Prior Anticoagulation Instructions: INR 2.2  Continue same dose of 1 tablet every day   Current Anticoagulation Instructions: INR 2.0  Take extra 1/2 tablet today then resume same dose of 1 tablet every day

## 2010-07-08 NOTE — Medication Information (Signed)
Summary: rov/sp  Anticoagulant Therapy  Managed by: Elaina Pattee, PharmD Referring MD: Willa Rough MD PCP: Kriste Basque, MD Supervising MD: Excell Seltzer MD, Casimiro Needle Indication 1: Atrial Fibrillation (ICD-427.31) Lab Used: [facility] Site: [facility] INR POC 2.5 INR RANGE 2 - 3  Dietary changes: no    Health status changes: no    Bleeding/hemorrhagic complications: no    Recent/future hospitalizations: no    Any changes in medication regimen? no    Recent/future dental: no  Any missed doses?: no       Is patient compliant with meds? yes      Comments: Saw Dr. Myrtis Ser today. Doing well.  Allergies: 1)  ! * Tussionex  Anticoagulation Management History:      The patient is taking warfarin and comes in today for a routine follow up visit.  Negative risk factors for bleeding include an age less than 60 years old.  The bleeding index is 'low risk'.  Positive CHADS2 values include History of HTN.  Negative CHADS2 values include Age > 72 years old.  The start date was 08/23/2008.  Anticoagulation responsible provider: Excell Seltzer MD, Casimiro Needle.  INR POC: 2.5.  Cuvette Lot#: [id number].  Exp: 01/2011.    Anticoagulation Management Assessment/Plan:      The patient's current anticoagulation dose is Warfarin sodium 5 mg tabs: Use as directed by Anticoagulation Clinic.  The target INR is 2 - 3.  The next INR is due 12/06/2009.  Anticoagulation instructions were given to patient.  Results were reviewed/authorized by Elaina Pattee, PharmD.  He was notified by Elaina Pattee, PharmD.         Prior Anticoagulation Instructions: INR 2.0  Take extra 1/2 tablet today then resume same dose of 1 tablet every day   Current Anticoagulation Instructions: INR 2.5. Take 1 tablet daily. Recheck in 4 weeks.

## 2010-07-08 NOTE — Progress Notes (Signed)
Summary: refill on med  Phone Note Call from Patient Call back at [facility] Phone [phone] Call back at [phone]   Caller: Patient Call For: nadel Reason for Call: Refill Medication Summary of Call: patient says pharm has been trying to call here and has sent a request for 3 days and they still havent heard anything. he needs a refill for hydrocodone.   [facility]- [facility] [phone] Initial call taken by: Valinda Hoar,  April 22, 2010 12:56 PM    Prescriptions: VICODIN 5-500 MG TABS (HYDROCODONE-ACETAMINOPHEN) take 1 tab by mouth every 6 hours as needed for pain- NOT TO EXCEED 3/d...  #90 x 1   Entered by:   Abigail Miyamoto RN   Authorized by:   Michele Mcalpine MD   Signed by:   Abigail Miyamoto RN on 04/22/2010   Method used:   Telephoned to ...       [facility] [address] (retail)       [address]       [place], [place]  [address]       Ph: [phone]       Fax: [phone]   RxID:   [id number]

## 2010-07-08 NOTE — Progress Notes (Signed)
Summary: results  Phone Note Call from Patient Call back at Work Phone [phone]   Caller: Patient Call For: Rosaleigh Brazzel Reason for Call: Talk to Nurse Summary of Call: results of brain scan/mri  and a exercise program pt can start. Initial call taken by: Eugene Gavia,  June 06, 2008 1:39 PM  Follow-up for Phone Call        called and spoke with pt.  pt requesting mri results from 12/29. MRI unsigned in pt's chart.  Pt also would like lab results from today.  Pt also wanted to start an exercise program but wanted to know what SN recommends: ie how much he can exercise, any restrictions, etc.  Please advise.  Aundra Millet Reynolds LPN  June 06, 2008 3:29 PM   Additional Follow-up for Phone Call Additional follow up Details #1::        I called pt and discussed...  SN Additional Follow-up by: Michele Mcalpine MD,  June 06, 2008 4:58 PM

## 2010-07-08 NOTE — Medication Information (Signed)
Summary: rov/sp  Anticoagulant Therapy  Managed by: Cloyde Reams, RN, BSN Referring MD: Willa Rough MD PCP: Kriste Basque, MD Supervising MD: Jens Som MD, Arlys John Indication 1: Atrial Fibrillation (ICD-427.31) Lab Used: [facility] Site: [facility] INR POC 2.2 INR RANGE 2 - 3  Dietary changes: no    Health status changes: no    Bleeding/hemorrhagic complications: no    Recent/future hospitalizations: no    Any changes in medication regimen? no    Recent/future dental: no  Any missed doses?: no       Is patient compliant with meds? yes       Allergies: 1)  ! * Tussionex  Anticoagulation Management History:      The patient is taking warfarin and comes in today for a routine follow up visit.  Negative risk factors for bleeding include an age less than 71 years old.  The bleeding index is 'low risk'.  Positive CHADS2 values include History of HTN.  Negative CHADS2 values include Age > 34 years old.  The start date was 08/23/2008.  Anticoagulation responsible provider: Jens Som MD, Arlys John.  INR POC: 2.2.  Cuvette Lot#: [id number].  Exp: 03/2011.    Anticoagulation Management Assessment/Plan:      The patient's current anticoagulation dose is Warfarin sodium 5 mg tabs: Use as directed by Anticoagulation Clinic.  The target INR is 2 - 3.  The next INR is due 01/31/2010.  Anticoagulation instructions were given to patient.  Results were reviewed/authorized by Cloyde Reams, RN, BSN.  He was notified by Cloyde Reams RN.         Prior Anticoagulation Instructions: INR 2.5  Continue same dose of 1 tablet every day.   Current Anticoagulation Instructions: INR 2.2  Continue on same dosage 5mg  daily.  Recheck in 4 weeks.

## 2010-07-08 NOTE — Medication Information (Signed)
Summary: rov/tm  Anticoagulant Therapy  Managed by: Lew Dawes, PharmD Candidate Referring MD: Willa Rough MD PCP: Kriste Basque, MD Supervising MD: Jens Som MD, Arlys John Indication 1: Atrial Fibrillation (ICD-427.31) Lab Used: [facility] Site: [facility] INR POC 1.6 INR RANGE 2 - 3  Dietary changes: no    Health status changes: no    Bleeding/hemorrhagic complications: no    Recent/future hospitalizations: no    Any changes in medication regimen? no    Recent/future dental: no  Any missed doses?: no       Is patient compliant with meds? yes       Allergies: 1)  ! * Tussionex  Anticoagulation Management History:      The patient is taking warfarin and comes in today for a routine follow up visit.  Negative risk factors for bleeding include an age less than 62 years old.  The bleeding index is 'low risk'.  Positive CHADS2 values include History of HTN.  Negative CHADS2 values include Age > 62 years old.  The start date was 08/23/2008.  Anticoagulation responsible provider: Jens Som MD, Arlys John.  INR POC: 1.6.  Cuvette Lot#: [id number].  Exp: 03/2010.    Anticoagulation Management Assessment/Plan:      The patient's current anticoagulation dose is Warfarin sodium 5 mg tabs: Use as directed by Anticoagulation Clinic.  The target INR is 2 - 3.  The next INR is due 07/12/2009.  Anticoagulation instructions were given to patient.  Results were reviewed/authorized by Lew Dawes, PharmD Candidate.  He was notified by Lew Dawes, PharmD Candidate.         Prior Anticoagulation Instructions: INR 2.1  Continue 1 tab daily except 0.5 tabs each Thursday.  Recheck in 4 weeks.   Current Anticoagulation Instructions: INR 1.6  Take 1.5 tablets today then continue same dose of 1 tablet daily except 0.5 tablet on Thursdays. Recheck in 3 weeks.

## 2010-07-08 NOTE — Progress Notes (Signed)
Summary: simvastatin dose  Medications Added CRESTOR 20 MG TABS (ROSUVASTATIN CALCIUM) Take one tablet by mouth daily.       Phone Note Other Incoming   Summary of Call: Pt was in to see coumadin clinic today and stopped by to let me know that he had increased his simvastatin back up to 80mg  daily, it had been decreased to 40mg  at his last office vist due to fda recommendations however he felt bad and was sweating a lot w/lower dose, explained these symtoms were not related to lowering simvastatin and the reason why we had lowered it, he states he feels symptoms were related to lower dose and is willing to try another med like crestor if he can not take simva 80, explained Dr Myrtis Ser was out of town and will discuss it with him next him and let him know what to do Meredith Staggers, RN  June 21, 2009 4:17 PM   Follow-up for Phone Call        Start Crestor 20mg  by mouth daily.  Talitha Givens, MD, [facility]  June 24, 2009 2:56 PM  pt is aware, new rx sent in, will recheck labs in 2 months Meredith Staggers, RN  June 26, 2009 10:21 AM     New/Updated Medications: CRESTOR 20 MG TABS (ROSUVASTATIN CALCIUM) Take one tablet by mouth daily. Prescriptions: CRESTOR 20 MG TABS (ROSUVASTATIN CALCIUM) Take one tablet by mouth daily.  #30 x 6   Entered by:   Meredith Staggers, RN   Authorized by:   Talitha Givens, MD, [facility]   Signed by:   Meredith Staggers, RN on 06/26/2009   Method used:   Electronically to        [facility] (retail)       [address]       [place], [place]  [address]       Ph: [phone]       Fax: [phone]   RxID:   [id number]

## 2010-07-08 NOTE — Medication Information (Signed)
Summary: rov/tm  Anticoagulant Therapy  Managed by: Cloyde Reams, RN, BSN Referring MD: Willa Rough MD PCP: Kriste Basque, MD Supervising MD: Shirlee Latch MD, Emrah Ariola Indication 1: Atrial Fibrillation (ICD-427.31) Lab Used: [facility] Site: [facility] INR POC 2.6 INR RANGE 2 - 3  Dietary changes: no    Health status changes: no    Bleeding/hemorrhagic complications: no    Recent/future hospitalizations: no    Any changes in medication regimen? no    Recent/future dental: no  Any missed doses?: no       Is patient compliant with meds? yes       Allergies (verified): 1)  ! * Tussionex  Anticoagulation Management History:      The patient is taking warfarin and comes in today for a routine follow up visit.  Negative risk factors for bleeding include an age less than 7 years old.  The bleeding index is 'low risk'.  Positive CHADS2 values include History of HTN.  Negative CHADS2 values include Age > 26 years old.  The start date was 08/23/2008.  Anticoagulation responsible provider: Shirlee Latch MD, Rayne Cowdrey.  INR POC: 2.6.  Cuvette Lot#: [id number].  Exp: 09/2010.    Anticoagulation Management Assessment/Plan:      The patient's current anticoagulation dose is Warfarin sodium 5 mg tabs: Use as directed by Anticoagulation Clinic.  The target INR is 2 - 3.  The next INR is due 08/23/2009.  Anticoagulation instructions were given to patient.  Results were reviewed/authorized by Cloyde Reams, RN, BSN.  He was notified by Cloyde Reams RN.         Prior Anticoagulation Instructions: INR 1.9 Today take 7.5mg s then change dose to 5mg s everyday. Recheck in 2-3 weeks.   Current Anticoagulation Instructions: INR 2.6  Continue taking 1 tablet daily.  Recheck in 4 weeks.

## 2010-07-08 NOTE — Progress Notes (Signed)
Summary: med ?  Phone Note Call from Patient Call back at [facility] Phone [phone]   Caller: Patient Call For: Urijah Arko Reason for Call: Talk to Nurse Summary of Call: Nerologist, Dr. Sandria Manly, wants pt to check with [place] and see if he should still be taking Vit. D 50000 once a week? Initial call taken by: Eugene Gavia,  July 30, 2009 9:54 AM  Follow-up for Phone Call        according to lab results append from 12/13 pt was to continue vit d 50000 for 3 months and then have labs rechecked. Lab order placed for pt to have vit d level drawn 2nd week in march. Pt aware.Carron Curie CMA  July 30, 2009 10:22 AM

## 2010-07-08 NOTE — Progress Notes (Signed)
Summary: order  Phone Note Call from Patient   Caller: Patient Call For: Brendan [name] Summary of Call: pt need order to have vitamin d checked and want to know if he should be fasting? Initial call taken by: Rickard Patience,  August 12, 2009 9:07 AM  Follow-up for Phone Call        Pt advised he does not have to fast for Vit D level. Carron Curie CMA  August 12, 2009 9:46 AM

## 2010-07-08 NOTE — Medication Information (Signed)
Summary: rov/tm   Anticoagulant Therapy  Managed by: Weston Brass, PharmD Referring MD: Willa Rough MD PCP: Kriste Basque, MD Supervising MD: Tenny Craw MD, Gunnar Fusi Indication 1: Atrial Fibrillation (ICD-427.31) Lab Used: [facility] Site: [facility] INR POC 1.8 INR RANGE 2 - 3  Dietary changes: no    Health status changes: no    Bleeding/hemorrhagic complications: no    Recent/future hospitalizations: no    Any changes in medication regimen? no    Recent/future dental: no  Any missed doses?: no       Is patient compliant with meds? yes       Allergies: 1)  ! * Tussionex  Anticoagulation Management History:      The patient is taking warfarin and comes in today for a routine follow up visit.  Negative risk factors for bleeding include an age less than 82 years old.  The bleeding index is 'low risk'.  Positive CHADS2 values include History of HTN.  Negative CHADS2 values include Age > 57 years old.  The start date was 08/23/2008.  Anticoagulation responsible provider: Tenny Craw MD, Gunnar Fusi.  INR POC: 1.8.  Cuvette Lot#: [id number].  Exp: 05/2011.    Anticoagulation Management Assessment/Plan:      The patient's current anticoagulation dose is Warfarin sodium 5 mg tabs: Use as directed by Anticoagulation Clinic.  The target INR is 2 - 3.  The next INR is due 04/25/2010.  Anticoagulation instructions were given to patient.  Results were reviewed/authorized by Weston Brass, PharmD.  He was notified by Ilean Skill D candidate.         Prior Anticoagulation Instructions: INR 2.3 Continue 5mg s everyday. Recheck in 4 weeks.   Current Anticoagulation Instructions: INR 1.8  Take an extra 1/2 tablet today, then cotinue same dose of 1 tablet everyday. Recheck in 4 weeks.

## 2010-07-08 NOTE — Medication Information (Signed)
Summary: rov/sl  Anticoagulant Therapy  Managed by: Bethena Midget, RN, BSN Referring MD: Willa Rough MD PCP: Kriste Basque, MD Supervising MD: Tenny Craw MD, Gunnar Fusi Indication 1: Atrial Fibrillation (ICD-427.31) Lab Used: [facility] Site: [facility] INR POC 2.3 INR RANGE 2 - 3  Dietary changes: no    Health status changes: no    Bleeding/hemorrhagic complications: no    Recent/future hospitalizations: no    Any changes in medication regimen? no    Recent/future dental: no  Any missed doses?: no       Is patient compliant with meds? yes       Allergies: 1)  ! * Tussionex  Anticoagulation Management History:      The patient is taking warfarin and comes in today for a routine follow up visit.  Negative risk factors for bleeding include an age less than 57 years old.  The bleeding index is 'low risk'.  Positive CHADS2 values include History of HTN.  Negative CHADS2 values include Age > 35 years old.  The start date was 08/23/2008.  Anticoagulation responsible provider: Tenny Craw MD, Gunnar Fusi.  INR POC: 2.3.  Cuvette Lot#: [id number].  Exp: 04/2011.    Anticoagulation Management Assessment/Plan:      The patient's current anticoagulation dose is Warfarin sodium 5 mg tabs: Use as directed by Anticoagulation Clinic.  The target INR is 2 - 3.  The next INR is due 03/28/2010.  Anticoagulation instructions were given to patient.  Results were reviewed/authorized by Bethena Midget, RN, BSN.  He was notified by Bethena Midget, RN, BSN.         Prior Anticoagulation Instructions: INR 2.5  Continue taking Coumadin 1 tab (5 mg) every day. Return to clinic in 4 weeks.   Current Anticoagulation Instructions: INR 2.3 Continue 5mg s everyday. Recheck in 4 weeks.

## 2010-07-08 NOTE — Letter (Signed)
Summary: [facility]   [facility]   Imported By: Roderic Ovens 08/29/2009 14:04:35  _____________________________________________________________________  External Attachment:    Type:   Image     Comment:   External Document

## 2010-07-08 NOTE — Letter (Signed)
Summary: [facility] Office Visit Note   [facility] Office Visit Note   Imported By: Roderic Ovens 04/07/2010 10:13:56  _____________________________________________________________________  External Attachment:    Type:   Image     Comment:   External Document

## 2010-07-08 NOTE — Assessment & Plan Note (Signed)
Summary: rov--6 months      Allergies Added:   Visit Type:  Follow-up Referring Provider:  Janalyn Harder, MD Primary Provider:  Alroy Dust, MD  CC:  CAD.  History of Present Illness: The patient is seen for followup of coronary artery disease and atrial fibrillation.  I saw him last year, 2011.  Is not having any chest pain.  Is not having episodes of atrial fibrillation.  He has lost some more weight.  He says that this is plateaued recently related to difficulties in following a diet with the current life stresses he has.  There's been no syncope or presyncope.  Patient mentions that his only concern right now is significant skin difficulties with his scalp.  Current Medications (verified): 1)  Warfarin Sodium 5 Mg Tabs (Warfarin Sodium) .... Use As Directed By Anticoagulation Clinic 2)  Aspirin 81 Mg Tbec (Aspirin) .... Take One Tablet By Mouth Daily 3)  Metoprolol Succinate 50 Mg Xr24h-Tab (Metoprolol Succinate) .... Take One Tablet By Mouth Daily 4)  Diltiazem Hcl Cr 180 Mg  Cp24 (Diltiazem Hcl) .... Take 1 Tab By Mouth Once Daily 5)  Digoxin 0.25 Mg Tabs (Digoxin) .... Take One Tablet By Mouth Daily 6)  Crestor 20 Mg Tabs (Rosuvastatin Calcium) .... Take One Tablet By Mouth Daily. 7)  Fenofibrate 160 Mg  Tabs (Fenofibrate) .... Take 1 Tab Daily.Marland KitchenMarland Kitchen 8)  Omeprazole 20 Mg  Tbec (Omeprazole) .Marland Kitchen.. 1 By Mouth Once Daily 9)  Soma 350 Mg Tabs (Carisoprodol) .... Take 1 Tablet By Mouth Three Times A Day - Not To Exceed 3/d... 10)  Etodolac 400 Mg  Tabs (Etodolac) .... Take 1 Tab By Mouth Two Times A Day W/ Food, As Needed For Arthritis Pain... 11)  Vicodin 5-500 Mg Tabs (Hydrocodone-Acetaminophen) .... Take 1 Tab By Mouth Every 6 Hours As Needed For Pain- Not To Exceed 3/d... 12)  Calcium Carbonate-Vitamin D 600-400 Mg-Unit  Tabs (Calcium Carbonate-Vitamin D) .... Take 1 Tablet By Mouth Two Times A Day 13)  Multivitamins   Tabs (Multiple Vitamin) .... Once Daily 14)  Vitamin B-12 500 Mcg   Tabs (Cyanocobalamin) .... Daily 15)  Vitamin D 1000 Unit Caps (Cholecalciferol) .... Take 1 Capsule By Mouth Once A Day 16)  Alprazolam 0.5 Mg Tabs (Alprazolam) .... Take 1 Tablet By Mouth Three Times A Day As Needed  Allergies (verified): 1)  ! * Tussionex  Past History:  Past Medical History: CIGARETTE SMOKER (ICD-305.1) PULMONARY NODULE (ICD-518.89) HYPERTENSION (ICD-401.9) CAD (ICD-414.00).....stent placed 2001....  /  nuclear.. June, 2010... no ischemia CARDIAC ARRHYTHMIA (ICD-427.9) ATRIAL FIBRILLATION, PAROXYSMAL (ICD-427.31) HYPERLIPIDEMIA (ICD-272.4) OBESITY (ICD-278.00) DIVERTICULOSIS OF COLON (ICD-562.10) COLONIC POLYPS (ICD-211.3) BENIGN PROSTATIC HYPERTROPHY, WITH OBSTRUCTION (ICD-600.01) Hx of TESTOSTERONE DEFICIENCY (ICD-257.2) ERECTILE DYSFUNCTION (ICD-302.72) BACK PAIN, LUMBAR (ICD-724.2) PERIPHERAL NEUROPATHY, LOWER EXTREMITY (ICD-355.8) ANXIETY DEPRESSION (ICD-300.4) ? of B12 DEFICIENCY (ICD-266.2) VITAMIN D DEFICIENCY (ICD-268.9) EF 60%...echo..04/2007 Blurred vision.Marland Kitchenepisode with confusion...evaluated over time by Dr. Sandria Manly Vitamin D treatment B12 treatment skin difficulties with scalp... November, 2011 Her Review of Systems       Patient denies fever, chills, headache, sweats, rash, change in vision, change in hearing, chest pain, cough, nausea vomiting, urinary symptoms.  All other systems are reviewed and are negative.  Vital Signs:  Patient profile:   60 year old male Height:      78 inches Weight:      282 pounds BMI:     32.71 Pulse rate:   65 / minute BP sitting:   102 / 64  (left  arm) Cuff size:   regular  Vitals Entered By: Hardin Negus, RMA (May 09, 2010 8:33 AM)  Physical Exam  General:  The patient is stable.  He is overweight but has lost weight since his last visit. Head:  head is atraumatic. Eyes:  no xanthelasma. Neck:  no jugular venous distention. Chest Wall:  no chest wall tenderness Lungs:  lungs are clear.   Respiratory effort is nonlabored. Heart:  cardiac exam reveals an S1-S2.  No clicks or significant murmurs. Abdomen:  abdomen is soft. Msk:  no musculoskeletal deformities. Extremities:  no peripheral edema. Skin:  no skin rashes. Psych:  patient is oriented to person time and place.  Affect is normal.   Impression & Recommendations:  Problem # 1:  ENCOUNTER FOR LONG-TERM USE OF ANTICOAGULANTS (ICD-V58.61) Patient remains on Coumadin.  No change in therapy.  Problem # 2:  neurology assessment The patient has not had any ongoing neurologic problems.  He has been seen by neurology.    Problem # 3:  HYPERTENSION (ICD-401.9)  His updated medication list for this problem includes:    Aspirin 81 Mg Tbec (Aspirin) .Marland Kitchen... Take one tablet by mouth daily    Metoprolol Succinate 50 Mg Xr24h-tab (Metoprolol succinate) .Marland Kitchen... Take one tablet by mouth daily    Diltiazem Hcl Cr 180 Mg Cp24 (Diltiazem hcl) .Marland Kitchen... Take 1 tab by mouth once daily Blood pressure is well-controlled.  No change in therapy.  Problem # 4:  CAD (ICD-414.00)  His updated medication list for this problem includes:    Warfarin Sodium 5 Mg Tabs (Warfarin sodium) ..... Use as directed by anticoagulation clinic    Aspirin 81 Mg Tbec (Aspirin) .Marland Kitchen... Take one tablet by mouth daily    Metoprolol Succinate 50 Mg Xr24h-tab (Metoprolol succinate) .Marland Kitchen... Take one tablet by mouth daily    Diltiazem Hcl Cr 180 Mg Cp24 (Diltiazem hcl) .Marland Kitchen... Take 1 tab by mouth once daily Coronary disease is stable.  His last nuclear scan was done in June, 2010.  There was no ischemia.  He does not need any followup studies at this time.  Orders: TLB-Lipid Panel (80061-LIPID) TLB-Hepatic/Liver Function Pnl (80076-HEPATIC) TLB-BMP (Basic Metabolic Panel-BMET) (80048-METABOL) TLB-CBC Platelet - w/Differential (85025-CBCD) TLB-TSH (Thyroid Stimulating Hormone) (84443-TSH)  Problem # 5:  ATRIAL FIBRILLATION, PAROXYSMAL (ICD-427.31)  His updated medication  list for this problem includes:    Warfarin Sodium 5 Mg Tabs (Warfarin sodium) ..... Use as directed by anticoagulation clinic    Aspirin 81 Mg Tbec (Aspirin) .Marland Kitchen... Take one tablet by mouth daily    Metoprolol Succinate 50 Mg Xr24h-tab (Metoprolol succinate) .Marland Kitchen... Take one tablet by mouth daily    Digoxin 0.25 Mg Tabs (Digoxin) .Marland Kitchen... Take one tablet by mouth daily There's been no clinical atrial arrhythmia.  He is on Coumadin.  No further workup.  Orders: TLB-Lipid Panel (80061-LIPID) TLB-Hepatic/Liver Function Pnl (80076-HEPATIC) TLB-BMP (Basic Metabolic Panel-BMET) (80048-METABOL) TLB-CBC Platelet - w/Differential (85025-CBCD) TLB-TSH (Thyroid Stimulating Hormone) (84443-TSH)  Problem # 6:  HYPERLIPIDEMIA (ICD-272.4)  His updated medication list for this problem includes:    Crestor 20 Mg Tabs (Rosuvastatin calcium) .Marland Kitchen... Take one tablet by mouth daily.    Fenofibrate 160 Mg Tabs (Fenofibrate) .Marland Kitchen... Take 1 tab daily... We will obtain fasting lipid profile today to reassess his labs and make decisions about his lipid medicines.  We'll be in touch with him about this.  Orders: TLB-Lipid Panel (80061-LIPID) TLB-Hepatic/Liver Function Pnl (80076-HEPATIC) TLB-BMP (Basic Metabolic Panel-BMET) (80048-METABOL) TLB-CBC Platelet - w/Differential (85025-CBCD) TLB-TSH (  Thyroid Stimulating Hormone) (84443-TSH)  Problem # 7:  SKIN RASH (ICD-782.1)  The patient has been having some difficulty with the skin of the scalp.  We will be recommending dermatology.  Orders: Misc. Referral (Misc. Ref)  Patient Instructions: 1)  Labs today 2)  You have been referred to Dr [name] 3)  Your physician wants you to follow-up in:  6 months. You will receive a reminder letter in the mail two months in advance. If you don't receive a letter, please call our office to schedule the follow-up appointment.

## 2010-07-08 NOTE — Letter (Signed)
Summary: [employer]  [employer]   Imported By: Roderic Ovens 07/08/2009 15:45:24  _____________________________________________________________________  External Attachment:    Type:   Image     Comment:   External Document

## 2010-07-08 NOTE — Assessment & Plan Note (Signed)
Summary: f55m      Allergies Added:   Visit Type:  Follow-up Primary Provider:  Kriste Basque, MD  CC:  atrial fib.  History of Present Illness: Patient is seen for follow up of A. fib and coronary artery disease.  He is actually doing well.  He is slowly losing weight.  He's not had chest pain.  He's not had any significant palpitations.  He is on Coumadin.  Current Medications (verified): 1)  Aspirin 81 Mg Tbec (Aspirin) .... Take One Tablet By Mouth Daily 2)  Metoprolol Succinate 50 Mg Xr24h-Tab (Metoprolol Succinate) .... Take One Tablet By Mouth Daily 3)  Diltiazem Hcl Cr 180 Mg  Cp24 (Diltiazem Hcl) .... Take 1 Tab By Mouth Once Daily 4)  Digoxin 0.25 Mg Tabs (Digoxin) .... Take One Tablet By Mouth Daily 5)  Crestor 20 Mg Tabs (Rosuvastatin Calcium) .... Take One Tablet By Mouth Daily. 6)  Fenofibrate 160 Mg  Tabs (Fenofibrate) .... Take 1 Tab Daily.Marland KitchenMarland Kitchen 7)  Omeprazole 20 Mg  Tbec (Omeprazole) .Marland Kitchen.. 1 By Mouth Once Daily 8)  Soma 350 Mg Tabs (Carisoprodol) .... Take 1 Tablet By Mouth Three Times A Day - Not To Exceed 3/d.Marland KitchenMarland Kitchen 9)  Etodolac 400 Mg  Tabs (Etodolac) .... Take 1 Tab By Mouth Two Times A Day W/ Food, As Needed For Arthritis Pain... 10)  Vicodin 5-500 Mg Tabs (Hydrocodone-Acetaminophen) .... Take 1 Tab By Mouth Every 6 Hours As Needed For Pain- Not To Exceed 3/d... 11)  Multivitamins   Tabs (Multiple Vitamin) .... Once Daily 12)  Vitamin B-12 500 Mcg  Tabs (Cyanocobalamin) .... Daily 13)  Warfarin Sodium 5 Mg Tabs (Warfarin Sodium) .... Use As Directed By Anticoagulation Clinic 14)  Vitamin D 1000 Unit Caps (Cholecalciferol) .... Take 1 Capsule By Mouth Once A Day 15)  Alprazolam 0.5 Mg Tabs (Alprazolam) .... Take 1 Tablet By Mouth Three Times A Day As Needed 16)  Calcium Carbonate-Vitamin D 600-400 Mg-Unit  Tabs (Calcium Carbonate-Vitamin D) .... Take 1 Tablet By Mouth Two Times A Day  Allergies (verified): 1)  ! * Tussionex  Past History:  Past Medical History: Reviewed  history from 05/24/2009 and no changes required. CIGARETTE SMOKER (ICD-305.1) - still smokes  ~ prev discussions on  smoking cessation    PULMONARY NODULE (ICD-518.89) - CXR's have been normal... CTChest 12/02 w/ stable small nodules that are likely granulomas...   ~  CXR 9/09 showed stable- clear lungs and no nodules seen on plain film.  HYPERTENSION (ICD-401.9) - controlled w/ TOPROL XL 50mg /d, & DILTIAZEM 180mg /d...  .  CAD (ICD-414.00) - followed by [name]...last seen 9/09- hx of SVT in 2001& PAF more recently... palpit and BP controlled w/ the Cardizem... had NuclearStressTest 1/08= normal without ischemia & EF=60%... cath 2001 w/ 3 vessel CAD- s/p PTCA/stent... he denies CP, change in SOB, edema, etc... exerc= yard, some walking prior to his back pain prob.  /  nuclear stress..11/2008...no ischemia  CARDIAC ARRHYTHMIA (ICD-427.9), & ATRIAL FIBRILLATION, PAROXYSMAL (ICD-427.31)- hx SVT and PAF in past... [name] indicated that his [place] score wasn't high enough for Coumadin therapy prev, but it was started 3/10 w/ incr PAF & ?TIA.Marland Kitchen. now on METOPROLOL ER 50mg /d, DIGOXIN 0.25mg /d, COUMADIN via CC...  HYPERLIPIDEMIA (ICD-272.4) - on SIMVASTATIN 80mg /d, FENOFIBRATE 160mg /d, + "diet" therapy... still not at goal, but not really dieting and not exercising... prev on Zetia but stopped by Cards-    ~  FLP 11/08 showed TChol 188, TG 170, HDL 31, LDL 123...  ~  FLP 9/09 showed TChol 224, TG 176, HDL 28, LDL 143... rec- incr Simvast 80/d & better diet...  ~  FLP 12/09 showed TChol 186, TG 161, HDL 31, LDL 123... rec contin Simva80 + [id number] & get wt down.  ~  FLP 6/10 showed TChol 185, TG 253, HDL 32, LDL 118... rec> needs better diet + exercise, refer to [facility].  OBESITY (ICD-278.00) - we discussed diet and exercise program in detail...  ~  9/09:  his weight is down 12# today to 322#.Marland Kitchen.   ~  weight 12/09 = 319#   ~weight May 20, 2009--295#  EF 60%...echo..04/2007 Blurred vision.Marland Kitchenepisode  with confusion...evaluated over time by Dr. Sandria Manly Vitamin D treatment B12 treatment  Review of Systems       Patient denies fever, chills, headache, sweats, rash, change in vision, change in hearing, chest pain, cough, nausea vomiting, urinary symptoms.  All of the systems are reviewed and are negative.  Vital Signs:  Patient profile:   60 year old male Height:      78 inches Weight:      288 pounds BMI:     33.40 Pulse rate:   43 / minute BP sitting:   100 / 56  (left arm) Cuff size:   large  Vitals Entered By: Burnett Kanaris, CNA (November 08, 2009 2:50 PM)  Physical Exam  General:  patient is stable in general. Eyes:  no xanthelasma. Neck:  no jugular venous distention. Lungs:  lungs are clear.  Respiratory effort is nonlabored. Heart:  cardiac exam reveals S1-S2.  No clicks or significant murmurs. Abdomen:  abdomen is soft. Extremities:  no peripheral edema. Psych:  patient is oriented to person time and place.  Affect is normal.   Impression & Recommendations:  Problem # 1:  HYPERTENSION (ICD-401.9)  His updated medication list for this problem includes:    Aspirin 81 Mg Tbec (Aspirin) .Marland Kitchen... Take one tablet by mouth daily    Metoprolol Succinate 50 Mg Xr24h-tab (Metoprolol succinate) .Marland Kitchen... Take one tablet by mouth daily    Diltiazem Hcl Cr 180 Mg Cp24 (Diltiazem hcl) .Marland Kitchen... Take 1 tab by mouth once daily Blood pressure is controlled.  No change in therapy.  Problem # 2:  CAD (ICD-414.00)  His updated medication list for this problem includes:    Aspirin 81 Mg Tbec (Aspirin) .Marland Kitchen... Take one tablet by mouth daily    Metoprolol Succinate 50 Mg Xr24h-tab (Metoprolol succinate) .Marland Kitchen... Take one tablet by mouth daily    Diltiazem Hcl Cr 180 Mg Cp24 (Diltiazem hcl) .Marland Kitchen... Take 1 tab by mouth once daily    Warfarin Sodium 5 Mg Tabs (Warfarin sodium) ..... Use as directed by anticoagulation clinic Coronary disease is stable.  EKG is done today and reviewed by me.  Her sinus  bradycardia.  There is no change in the QRS.  No change.  Problem # 3:  ATRIAL FIBRILLATION, PAROXYSMAL (ICD-427.31)  His updated medication list for this problem includes:    Aspirin 81 Mg Tbec (Aspirin) .Marland Kitchen... Take one tablet by mouth daily    Metoprolol Succinate 50 Mg Xr24h-tab (Metoprolol succinate) .Marland Kitchen... Take one tablet by mouth daily    Digoxin 0.25 Mg Tabs (Digoxin) .Marland Kitchen... Take one tablet by mouth daily    Warfarin Sodium 5 Mg Tabs (Warfarin sodium) ..... Use as directed by anticoagulation clinic The patient is not having any recurrent atrial fib.  He is stable.  Problem # 4:  ENCOUNTER FOR LONG-TERM USE OF ANTICOAGULANTS (ICD-V58.61) Patient  is on Coumadin.  We discussed Pradaxa.  He read that it can cause some GI symptoms.  I told her my preference was to remain on Coumadin.  He agrees.  We'll discuss it further in the future.  Patient Instructions: 1)  Your physician recommends that you schedule a follow-up appointment in: 6 months

## 2010-07-08 NOTE — Medication Information (Signed)
Summary: rov/sel   Anticoagulant Therapy  Managed by: Lyna Poser, PharmD Referring MD: Willa Rough MD PCP: Kriste Basque, MD Supervising MD: Antoine Poche MD,James Indication 1: Atrial Fibrillation (ICD-427.31) Lab Used: [facility] Site: [facility] INR POC 2.4 INR RANGE 2 - 3  Dietary changes: no    Health status changes: no    Bleeding/hemorrhagic complications: no    Recent/future hospitalizations: no    Any changes in medication regimen? no    Recent/future dental: no  Any missed doses?: no       Is patient compliant with meds? yes       Allergies: 1)  ! * Tussionex  Anticoagulation Management History:      The patient is taking warfarin and comes in today for a routine follow up visit.  Negative risk factors for bleeding include an age less than 44 years old.  The bleeding index is 'low risk'.  Positive CHADS2 values include History of HTN.  Negative CHADS2 values include Age > 9 years old.  The start date was 08/23/2008.  Anticoagulation responsible provider: Hochrein MD,James.  INR POC: 2.4.  Exp: 05/2011.    Anticoagulation Management Assessment/Plan:      The patient's current anticoagulation dose is Warfarin sodium 5 mg tabs: Use as directed by Anticoagulation Clinic.  The target INR is 2 - 3.  The next INR is due 05/23/2010.  Anticoagulation instructions were given to patient.  Results were reviewed/authorized by Lyna Poser, PharmD.         Prior Anticoagulation Instructions: INR 1.8  Take an extra 1/2 tablet today, then cotinue same dose of 1 tablet everyday. Recheck in 4 weeks.  Current Anticoagulation Instructions: INR 2.4 Continue taking 1 tablet everyday. Recheck in 4 weeks.

## 2010-07-08 NOTE — Progress Notes (Signed)
Summary: heart rate 47 and B/P  112/68   Phone Note Call from Patient Call back at [facility] Phone [phone]   Caller: Patient Summary of Call: Heart rate of 47  and b/p 112/68 Initial call taken by: Judie Grieve,  April 09, 2010 1:08 PM  Follow-up for Phone Call        pt had physical at work today and was told HR was 47 that he should let Dr Myrtis Ser know, pt is assyptomatic just wanted Dr Myrtis Ser to be aware, looks like HR has been around 40s at last few OV will send info to Dr [name], RN  April 09, 2010 2:37 PM      Appended Document: heart rate 47 and B/P  112/68 I am OK with this as long as he is feeling OK

## 2010-07-08 NOTE — Medication Information (Signed)
Summary: rov/cb  Anticoagulant Therapy  Managed by: Weston Brass, PharmD Referring MD: Willa Rough MD PCP: Kriste Basque, MD Supervising MD: Juanda Chance MD, Shaquavia Whisonant Indication 1: Atrial Fibrillation (ICD-427.31) Lab Used: [facility] Site: [facility] INR POC 2.5 INR RANGE 2 - 3  Dietary changes: no    Health status changes: no    Bleeding/hemorrhagic complications: no    Recent/future hospitalizations: no    Any changes in medication regimen? no    Recent/future dental: no  Any missed doses?: no       Is patient compliant with meds? yes       Allergies: 1)  ! * Tussionex  Anticoagulation Management History:      The patient is taking warfarin and comes in today for a routine follow up visit.  Negative risk factors for bleeding include an age less than 9 years old.  The bleeding index is 'low risk'.  Positive CHADS2 values include History of HTN.  Negative CHADS2 values include Age > 84 years old.  The start date was 08/23/2008.  Anticoagulation responsible provider: Juanda Chance MD, Smitty Cords.  INR POC: 2.5.  Cuvette Lot#: [id number].  Exp: 02/2011.    Anticoagulation Management Assessment/Plan:      The patient's current anticoagulation dose is Warfarin sodium 5 mg tabs: Use as directed by Anticoagulation Clinic.  The target INR is 2 - 3.  The next INR is due 01/03/2010.  Anticoagulation instructions were given to patient.  Results were reviewed/authorized by Weston Brass, PharmD.  He was notified by Weston Brass PharmD.         Prior Anticoagulation Instructions: INR 2.5. Take 1 tablet daily. Recheck in 4 weeks.  Current Anticoagulation Instructions: INR 2.5  Continue same dose of 1 tablet every day.

## 2010-07-08 NOTE — Medication Information (Signed)
Summary: rov/eac  Anticoagulant Therapy  Managed by: Weston Brass, PharmD Referring MD: Willa Rough MD PCP: Kriste Basque, MD Supervising MD: Graciela Husbands MD, Viviann Spare Indication 1: Atrial Fibrillation (ICD-427.31) Lab Used: [facility] Site: [facility] INR POC 2.2 INR RANGE 2 - 3  Dietary changes: no    Health status changes: no    Bleeding/hemorrhagic complications: no    Recent/future hospitalizations: no    Any changes in medication regimen? no    Recent/future dental: no  Any missed doses?: no       Is patient compliant with meds? yes       Allergies: 1)  ! * Tussionex  Anticoagulation Management History:      The patient is taking warfarin and comes in today for a routine follow up visit.  Negative risk factors for bleeding include an age less than 62 years old.  The bleeding index is 'low risk'.  Positive CHADS2 values include History of HTN.  Negative CHADS2 values include Age > 62 years old.  The start date was 08/23/2008.  Anticoagulation responsible provider: Graciela Husbands MD, Viviann Spare.  INR POC: 2.2.  Cuvette Lot#: [id number].  Exp: 10/2010.    Anticoagulation Management Assessment/Plan:      The patient's current anticoagulation dose is Warfarin sodium 5 mg tabs: Use as directed by Anticoagulation Clinic.  The target INR is 2 - 3.  The next INR is due 10/18/2009.  Anticoagulation instructions were given to patient.  Results were reviewed/authorized by Weston Brass, PharmD.  He was notified by Weston Brass PharmD.         Prior Anticoagulation Instructions: INR 2.3  Continue taking 1 tablet daily.  Return to clinic in 4 weeks.   Current Anticoagulation Instructions: INR 2.2  Continue same dose of 1 tablet every day

## 2010-07-08 NOTE — Medication Information (Signed)
Summary: rov/ewj  Anticoagulant Therapy  Managed by: Reina Fuse, PharmD Referring MD: Willa Rough MD PCP: Kriste Basque, MD Supervising MD: Antoine Poche MD, Fayrene Fearing Indication 1: Atrial Fibrillation (ICD-427.31) Lab Used: [facility] Site: [facility] INR POC 2.5 INR RANGE 2 - 3  Dietary changes: no    Health status changes: no    Bleeding/hemorrhagic complications: no    Recent/future hospitalizations: no    Any changes in medication regimen? no    Recent/future dental: no  Any missed doses?: no       Is patient compliant with meds? yes       Allergies: 1)  ! * Tussionex  Anticoagulation Management History:      The patient is taking warfarin and comes in today for a routine follow up visit.  Negative risk factors for bleeding include an age less than 30 years old.  The bleeding index is 'low risk'.  Positive CHADS2 values include History of HTN.  Negative CHADS2 values include Age > 12 years old.  The start date was 08/23/2008.  Anticoagulation responsible provider: Antoine Poche MD, Fayrene Fearing.  INR POC: 2.5.  Cuvette Lot#: [id number].  Exp: 03/2011.    Anticoagulation Management Assessment/Plan:      The patient's current anticoagulation dose is Warfarin sodium 5 mg tabs: Use as directed by Anticoagulation Clinic.  The target INR is 2 - 3.  The next INR is due 02/28/2010.  Anticoagulation instructions were given to patient.  Results were reviewed/authorized by Reina Fuse, PharmD.  He was notified by Reina Fuse, PharmD.         Prior Anticoagulation Instructions: INR 2.2  Continue on same dosage 5mg  daily.  Recheck in 4 weeks.    Current Anticoagulation Instructions: INR 2.5  Continue taking Coumadin 1 tab (5 mg) every day. Return to clinic in 4 weeks.

## 2010-07-08 NOTE — Medication Information (Signed)
Summary: rov/ewj  Anticoagulant Therapy  Managed by: Eda Keys, PharmD Referring MD: Willa Rough MD PCP: Kriste Basque, MD Supervising MD: Clifton James MD, Cristal Deer Indication 1: Atrial Fibrillation (ICD-427.31) Lab Used: [facility] Site: [facility] INR POC 2.3 INR RANGE 2 - 3  Dietary changes: no    Health status changes: no    Bleeding/hemorrhagic complications: no    Recent/future hospitalizations: no    Any changes in medication regimen? no    Recent/future dental: no  Any missed doses?: no       Is patient compliant with meds? yes       Allergies: 1)  ! * Tussionex  Anticoagulation Management History:      The patient is taking warfarin and comes in today for a routine follow up visit.  Negative risk factors for bleeding include an age less than 47 years old.  The bleeding index is 'low risk'.  Positive CHADS2 values include History of HTN.  Negative CHADS2 values include Age > 62 years old.  The start date was 08/23/2008.  Anticoagulation responsible provider: Clifton James MD, Cristal Deer.  INR POC: 2.3.  Cuvette Lot#: [id number].  Exp: 10/2010.    Anticoagulation Management Assessment/Plan:      The patient's current anticoagulation dose is Warfarin sodium 5 mg tabs: Use as directed by Anticoagulation Clinic.  The target INR is 2 - 3.  The next INR is due 09/20/2009.  Anticoagulation instructions were given to patient.  Results were reviewed/authorized by Eda Keys, PharmD.  He was notified by Eda Keys.         Prior Anticoagulation Instructions: INR 2.6  Continue taking 1 tablet daily.  Recheck in 4 weeks.    Current Anticoagulation Instructions: INR 2.3  Continue taking 1 tablet daily.  Return to clinic in 4 weeks.

## 2010-07-08 NOTE — Medication Information (Signed)
Summary: Tax adviser   Imported By: [employer] 08/15/2009 11:44:54  _____________________________________________________________________  External Attachment:    Type:   Image     Comment:   External Document

## 2010-07-09 ENCOUNTER — Telehealth: Payer: Self-pay | Admitting: Pulmonary Disease

## 2010-07-10 NOTE — Progress Notes (Signed)
Summary: pls mail lab results to pt   Phone Note Call from Patient   Caller: Patient  Summary of Call: please mail lab results to pt  Initial call taken by: Glynda Jaeger,  May 16, 2010 10:08 AM  Follow-up for Phone Call        Mailed copy of labs to patient's home per his request dated from 05/09/2010.  Layne Benton, RN, BSN  May 16, 2010 11:08 AM

## 2010-07-10 NOTE — Medication Information (Signed)
Summary: rov/sp   Anticoagulant Therapy  Managed by: Cloyde Reams, RN, BSN Referring MD: Willa Rough MD PCP: Alroy Dust, MD Supervising MD: Antoine Poche MD, Fayrene Fearing Indication 1: Atrial Fibrillation (ICD-427.31) Lab Used: [facility] Site: [facility] INR POC 2.1 INR RANGE 2 - 3  Dietary changes: no    Health status changes: no    Bleeding/hemorrhagic complications: no    Recent/future hospitalizations: no    Any changes in medication regimen? yes       Details: Crestor increased from 20 mg to 40 mg   Recent/future dental: no  Any missed doses?: no       Is patient compliant with meds? yes       Allergies: 1)  ! * Tussionex  Anticoagulation Management History:      The patient is taking warfarin and comes in today for a routine follow up visit.  Negative risk factors for bleeding include an age less than 70 years old.  The bleeding index is 'low risk'.  Positive CHADS2 values include History of HTN.  Negative CHADS2 values include Age > 42 years old.  The start date was 08/23/2008.  Anticoagulation responsible provider: Antoine Poche MD, Fayrene Fearing.  INR POC: 2.1.  Cuvette Lot#: [id number].  Exp: 07/2011.    Anticoagulation Management Assessment/Plan:      The patient's current anticoagulation dose is Warfarin sodium 5 mg tabs: Use as directed by Anticoagulation Clinic.  The target INR is 2 - 3.  The next INR is due 07/18/2010.  Anticoagulation instructions were given to patient.  Results were reviewed/authorized by Cloyde Reams, RN, BSN.  He was notified by Stephannie Peters, PharmD Candidate .         Prior Anticoagulation Instructions: INR 2.3  Continue same dose of 1 tablet every day.  Recheck INR in 4 weeks.   Current Anticoagulation Instructions: INR 2.1  Coumadin 5 mg tablets - Continue 1 tablet every day

## 2010-07-10 NOTE — Assessment & Plan Note (Signed)
Summary: ROV 6 MONTHS///KP   Primary Care Provider:  Alroy Dust, MD  CC:  6 month ROV & review of mult medical problems....  History of Present Illness: 60 y/o WM here for a follow up visit... he has mult med problems including continued smoking & underlying COPD;  HBP;  CAD & PAF followed by [name] on Coumadin via the CC;  Hyperlipidemia & Obesity;  LBP & Neuropathy w/ eval from [name];  Anxiety & some depression...   ~  November 18, 2009:  Yearly ROV & doing satis he says... notes some sinus congestion, drainage, no discoloration, blood, fever, HA, etc... we discussed Rx w/ Mucinex, Saline, etc... he saw [facility] 6/11 & he was in [place] on EKG- remains on Coumadin etc w/ PAF... BP controlled on meds, but unfotunately he hasn't quit smoking, although he has lost 27# on diet... his Simva80 was switched to Crestor20 & due for f/u labs (see below).   ~  May 19, 2010:  17mo ROV- c/o arthritis "all over" and low energy> he's under alot of stress-work, home life etc, feels depressed but doesn't want additional meds... states that Testosterone level was low in the past & he's been followed by [name]/ [name] for Urology... we discussed re-checking Testos level (420) & Rx accordingly (supportive care)... he's still smoking  ~1/4ppd, denies CP, palpit, ch in DOE;  [facility] 05/09/10- stable no changes made, labs done (incr TG, low HDL) but already on Cres20 & Fenofib160- needs better diet, continue wt reduction... also saw [facility] 10/11 w/ pt concern re: memory, but he has gait abn, Hx TIA, ?CMT Neuropathy, & Tremor ([name] wanted to do EMG/NCV & sleep study but pt declined... no change in meds... given Pneumonia vaccine & TDAP today.    Current Problems:   CIGARETTE SMOKER (ICD-305.1) - still smokes  ~1/4 ppd... we have prev discussed smoking cessation many times but he is not motivated to quit... "I'm working on it".  PULMONARY NODULE (ICD-518.89) - CXR's have been normal... CTChest 12/02 w/ stable  small nodules that are likely granulomas...   ~  CXR 9/09 showed stable- clear lungs and no nodules seen on plain film.  ~  CXR 6/11 showed COPD/ emphysema, no nodules, NAD...  HYPERTENSION (ICD-401.9) - controlled w/ TOPROL XL 50mg /d, & DILTIAZEM 180mg /d... BP= 112/60 and doing well.. denies HA, fatigue, visual changes, CP, dizziness, syncope, dyspnea, edema, etc...  CAD (ICD-414.00) - followed by [name] on ASA 81mg  daily... seen 9/09- hx of SVT in 2001& PAF more recently... palpit and BP controlled w/ the Cardizem... had NuclearStressTest 1/08= normal without ischemia & EF=60%... cath 2001 w/ 3 vessel CAD- s/p PTCA/stent... he denies CP, change in SOB, edema, etc... exerc= yard, some walking prior to his back pain prob.  CARDIAC ARRHYTHMIA (ICD-427.9), & ATRIAL FIBRILLATION, PAROXYSMAL (ICD-427.31)- hx SVT and PAF in past... [name] indicated that his [place] score wasn't high enough for Coumadin therapy prev, but it was started 3/10 w/ incr PAF & ?TIA.Marland Kitchen. now on METOPROLOL ER 50mg /d, DIGOXIN 0.25mg /d, COUMADIN via CC...  HYPERLIPIDEMIA (ICD-272.4) - now on CRESTOR 20mg /d, FENOFIBRATE 160mg /d, + "diet" therapy... prev on Zetia but stopped by Cards & Simva80 stopped w/ new FDA guidelines.  ~  FLP 11/08 showed TChol 188, TG 170, HDL 31, LDL 123...  ~  FLP 9/09 showed TChol 224, TG 176, HDL 28, LDL 143... rec- incr Simvast 80/d & better diet...  ~  FLP 12/09 showed TChol 186, TG 161, HDL 31, LDL 123... rec contin Simva80 +  [id number] & get wt down.  ~  FLP 6/10 showed TChol 185, TG 253, HDL 32, LDL 118... rec> needs better diet + exercise, refer to [facility].  ~  FLP at work 2/11 (on Simva80+Feno160) showed TChol 184, TG 304, HDL 33, LDL 90  ~  5/11: changed from Simva80 to CRESTOR 20mg /d...  ~  FLP 6/11 on Cres20+Feno160 showed TChol 168, TG 185, HDL 32, LDL 99  ~  FLP 12/1 on Cres20+Feno160 showed TChol 184, TG 242, HDL 30, LDL 108... needs better diet, gt wt down.  OBESITY (ICD-278.00) - we  discussed diet and exercise program in detail...  ~  9/09:    his weight is down 12# today to 322#.Marland Kitchen.   ~  weight 12/09 = 319#  ~  weight 3/10 = 315#  ~  weight 6/10 = 309#  ~  weight 6/11 = 282#... keep up the great work...  ~  weight 12/11 = 284... what happened?  GERD - on OMEPRAZOLE 20mg /d... DIVERTICULOSIS OF COLON (ICD-562.10), & COLONIC POLYPS (ICD-211.3) - last colonoscopy was 2/06 by DrStark showing divertics, and several 2-19mm polyps (hyperplastic)... f/u planned 2014...  BENIGN PROSTATIC HYPERTROPHY, WITH OBSTRUCTION (ICD-600.01),  Hx of TESTOSTERONE DEFICIENCY (ICD-257.2),  & ERECTILE DYSFUNCTION (ICD-302.72) - followed by Urology- [name] & [name]...  ~  labs 12/11 showed Testosterone level = 420 (350-890)  BACK PAIN, LUMBAR (ICD-724.2) - Ortho eval by DrGraves w/ PT recommended & improved... he uses ETODOLAC 400mg Bid Prn, SOMA 350mg Tid Prn, & VICODIN Prn...  ~  LumbarXRay 3/10 showed mild scoliosis, DDD w/ osteophytes & facet degen changes...   PERIPHERAL NEUROPATHY, LOWER EXTREMITY (ICD-355.8) - eval 3/10 by Autumn Patty w/ prob peripheral neuropathy found ?CMT ("I'm clumsey, I fall")... labs 3/10 showed borderline B 12 level = 273 by [name] & Rx'd w/ Vit B 12 572mcg/d by mouth.  ~  labs here 6/10 showed B 12 level = 584  ~  f/u Neuro eval 10/11 by [name] for Gait abn, Hx TIA, ?CMT neuropathy, LE weakness, Tremor, FamHx Alz dis> wanted to do EMG/NCV & sleep study but pt declined.  ANXIETY DEPRESSION (ICD-300.4) - on ALPRAZOLAM 0.5mg  Tid Prn... under alot of stress> work, home life, & wife w/ some variant of musc dystrophy...  12/09 w/ confusional episode, couldn't make decision, eval [facility] w/ neg CT Brain & neg exam, subseq MRI w/ min atrophy/ sm vessel dis, NAD; Rx ASA 81mg /d.  ? of B12 DEFICIENCY (ICD-266.2) & VITAMIN D DEFICIENCY (ICD-268.9) - as noted Vit B12 was borderline per [name] 3/10 (273) and he was started on oral B12 supplement  ~528mcg/d... repeat B12 level 6/10 = 584,  improved... Vit D level = 18 6/10 & pt rec to start Vit D supplement w/ repeat Vit D level = 27 in Mar11... OK to continue 1000 u daily...  ~  labs 6/11:  Vit B12= 606;  Vit D= 35... continue supplements.   Preventive Screening-Counseling & Management  Alcohol-Tobacco     Smoking Status: current     Packs/Day: 1/2 ppd  Caffeine-Diet-Exercise     Exercise (avg: min/session): 8:00  Allergies: 1)  ! * Tussionex  Comments:  Nurse/Medical Assistant: The patient's medications and allergies were reviewed with the patient and were updated in the Medication and Allergy Lists.  Past History:  Past Medical History: CIGARETTE SMOKER (ICD-305.1) PULMONARY NODULE (ICD-518.89) HYPERTENSION (ICD-401.9) CAD (ICD-414.00).....stent placed 2001....  /  nuclear.. June, 2010... no ischemia CARDIAC ARRHYTHMIA (ICD-427.9) ATRIAL FIBRILLATION, PAROXYSMAL (ICD-427.31) - EF 60%...echo..04/2007 HYPERLIPIDEMIA (  ICD-272.4) OBESITY (ICD-278.00) DIVERTICULOSIS OF COLON (ICD-562.10) COLONIC POLYPS (ICD-211.3) BENIGN PROSTATIC HYPERTROPHY, WITH OBSTRUCTION (ICD-600.01) Hx of TESTOSTERONE DEFICIENCY (ICD-257.2) ERECTILE DYSFUNCTION (ICD-302.72) BACK PAIN, LUMBAR (ICD-724.2) PERIPHERAL NEUROPATHY, LOWER EXTREMITY (ICD-355.8) ANXIETY DEPRESSION (ICD-300.4) ? of B12 DEFICIENCY (ICD-266.2) - B12 treatment orally VITAMIN D DEFICIENCY (ICD-268.9) - Vitamin D treatment Blurred vision.Marland Kitchenepisode with confusion...evaluated over time by Dr. Sandria Manly skin difficulties with scalp... November, 2011  Past Surgical History: S/P Tonsillectomy S/P Collar Bone fx S/P Right Arm fx  Family History: Reviewed history from 02/09/2008 and no changes required. mother deceased age 19 from stroke/alzheimers father deceased age 68 from stroke and heart failure 1 sibling alive age 31  hx of dementia,blood clots 1 sibling deceased age 34  hx of bil knee replacement  Social History: Reviewed history from 05/20/2009 and no changes  required. current smoker---1/4 ppd exposed to second hand smoke exercise----yard work several times per week caffeine use:  2 cups per day etoh---1 or less per day married 3 step-children  Review of Systems      See HPI       The patient complains of dyspnea on exertion, muscle weakness, and difficulty walking.  The patient denies anorexia, fever, weight loss, weight gain, vision loss, decreased hearing, hoarseness, chest pain, syncope, peripheral edema, prolonged cough, headaches, hemoptysis, abdominal pain, melena, hematochezia, severe indigestion/heartburn, hematuria, incontinence, suspicious skin lesions, transient blindness, depression, unusual weight change, abnormal bleeding, enlarged lymph nodes, and angioedema.    Vital Signs:  Patient profile:   60 year old male Height:      78 inches Weight:      283.38 pounds BMI:     32.87 O2 Sat:      96 % on Room air Temp:     97.1 degrees F oral Pulse rate:   48 / minute BP sitting:   112 / 60  (right arm) Cuff size:   regular  Vitals Entered By: Randell Loop CMA (May 19, 2010 8:50 AM)  O2 Sat at Rest %:  96 O2 Flow:  Room air CC: 6 month ROV & review of mult medical problems... Is Patient Diabetic? No Pain Assessment Patient in pain? yes      Onset of pain  sometimes with the arthritis pain Comments no changes in meds today   Physical Exam  Additional Exam:  WD, Obese, 60 y/o WM in NAD... he is 6'6" tall and weighs 282lbs GENERAL:  Alert & oriented; pleasant & cooperative... HEENT:  [place]/AT, EOM-wnl, PERRLA, EACs-clear, TMs-wnl, NOSE-clear, THROAT-clear & wnl. NECK:  Supple w/ fairROM; no JVD; normal carotid impulses w/o bruits; no thyromegaly or nodules palpated; no lymphadenopathy. CHEST:  Clear to P & A; without wheezes/ rales/ or rhonchi. HEART:  Regular Rhythm; gr 1/6 SEM without rubs or gallops heard... ABDOMEN:  Soft & nontender; normal bowel sounds; no organomegaly or masses detected. EXT: without  deformities, mod arthritic changes; no varicose veins/ +venous insuffic/ tr edema.  NEURO:  CN's intact;  no focal neuro deficits x sl decr sensation in LE's & wide based gait... DERM:  No lesions noted; no rash etc...    MISC. Report  Procedure date:  05/09/2010  Findings:      Labs from 05/09/10 by [name] reviewed w/ the pt... Testosterone level 05/19/10 = 420 (350-890)...   Impression & Recommendations:  Problem # 1:  CIGARETTE SMOKER (ICD-305.1) Smoker, COPD, hx pulm nodule (likely granuloma)> we discussed smoking cessation (again) & he is not interested in help...  Problem # 2:  HYPERTENSION (  ICD-401.9) BP controlled on meds>  continue same. His updated medication list for this problem includes:    Metoprolol Succinate 50 Mg Xr24h-tab (Metoprolol succinate) .Marland Kitchen... Take one tablet by mouth daily    Diltiazem Hcl Cr 180 Mg Cp24 (Diltiazem hcl) .Marland Kitchen... Take 1 tab by mouth once daily  Problem # 3:  CAD (ICD-414.00) CAD, AFib, on Coumadin via CC>  continue same Rx... His updated medication list for this problem includes:    Aspirin 81 Mg Tbec (Aspirin) .Marland Kitchen... Take one tablet by mouth daily    Metoprolol Succinate 50 Mg Xr24h-tab (Metoprolol succinate) .Marland Kitchen... Take one tablet by mouth daily    Diltiazem Hcl Cr 180 Mg Cp24 (Diltiazem hcl) .Marland Kitchen... Take 1 tab by mouth once daily  Problem # 4:  HYPERLIPIDEMIA (ICD-272.4) He needs better diet + exercise>  on max Fibrate therapy & Cres20 now... continue same, must get weight down... His updated medication list for this problem includes:    Crestor 20 Mg Tabs (Rosuvastatin calcium) .Marland Kitchen... Take one tablet by mouth daily.    Fenofibrate 160 Mg Tabs (Fenofibrate) .Marland Kitchen... Take 1 tab daily...  Problem # 5:  OBESITY (ICD-278.00) Weight reduction is key!!!  Problem # 6:  BENIGN PROSTATIC HYPERTROPHY, WITH OBSTRUCTION (ICD-600.01) GU followed by DrPeterson & Wrenn>  Testosterone level is OK... PSA was normal 6/11...  Problem # 7:  PERIPHERAL  NEUROPATHY, LOWER EXTREMITY (ICD-355.8) Followed by Autumn Patty & pt declined further eval w/ EMG & sleep study...  Problem # 8:  OTHER MEDICAL PROBLEMS AS NOTED>>> Given Pneumovax & TDAP today...  Complete Medication List: 1)  Warfarin Sodium 5 Mg Tabs (Warfarin sodium) .... Use as directed by anticoagulation clinic 2)  Aspirin 81 Mg Tbec (Aspirin) .... Take one tablet by mouth daily 3)  Metoprolol Succinate 50 Mg Xr24h-tab (Metoprolol succinate) .... Take one tablet by mouth daily 4)  Diltiazem Hcl Cr 180 Mg Cp24 (Diltiazem hcl) .... Take 1 tab by mouth once daily 5)  Digoxin 0.25 Mg Tabs (Digoxin) .... Take one tablet by mouth daily 6)  Crestor 20 Mg Tabs (Rosuvastatin calcium) .... Take one tablet by mouth daily. 7)  Fenofibrate 160 Mg Tabs (Fenofibrate) .... Take 1 tab daily.Marland KitchenMarland Kitchen 8)  Omeprazole 20 Mg Tbec (Omeprazole) .Marland Kitchen.. 1 by mouth once daily 9)  Vicodin 5-500 Mg Tabs (Hydrocodone-acetaminophen) .... Take 1 tab by mouth every 6 hours as needed for pain- not to exceed 3/d... 10)  Etodolac 400 Mg Tabs (Etodolac) .... Take 1 tab by mouth two times a day w/ food, as needed for arthritis pain... 11)  Soma 350 Mg Tabs (Carisoprodol) .... Take 1 tablet by mouth three times a day - not to exceed 3/d... 12)  Calcium Carbonate-vitamin D 600-400 Mg-unit Tabs (Calcium carbonate-vitamin d) .... Take 1 tablet by mouth two times a day 13)  Multivitamins Tabs (Multiple vitamin) .... Once daily 14)  Vitamin B-12 500 Mcg Tabs (Cyanocobalamin) .... Daily 15)  Vitamin D 1000 Unit Caps (Cholecalciferol) .... Take 1 capsule by mouth once a day 16)  Alprazolam 0.5 Mg Tabs (Alprazolam) .... Take 1 tablet by mouth three times a day as needed  Other Orders: TLB-Testosterone, Total (84403-TESTO) Tdap => 62yrs IM (04540) Admin 1st Vaccine (98119) Pneumococcal Vaccine (14782) Admin of Any Addtl Vaccine (95621)  Patient Instructions: 1)  Today we updated your med list- see below.... 2)  Continue your current meds  the same for now... 3)  Today we checked your Testosterone level... if it is low we will give you  a trial of Androgel as we discussed... 4)  Let's get on track w/ our diet + exercise program... 5)  Today we gave you the PNEUMONIA vaccine ( you will need a second shot in about 5yrs) & the TDAP combined Tetanus shot (good for 82yrs)... 6)  Call for any problems.Marland KitchenMarland Kitchen 7)  Let's plan a follow up visit about 27mo after [name]' planned follow up so you don't have to see both of [place] at the same time...   Immunization History:  Influenza Immunization History:    Influenza:  historical (04/22/2010)  Immunizations Administered:  Tetanus Vaccine:    Vaccine Type: Tdap    Site: left deltoid    Mfr: GlaxoSmithKline    Dose: 0.5 ml    Route: IM    Given by: Randell Loop CMA    Exp. Date: 08/31/2011    Lot #: [id number]    VIS given: 04/25/08 version given May 19, 2010.  Pneumonia Vaccine:    Vaccine Type: Pneumovax    Site: right deltoid    Mfr: Merck    Dose: 0.5 ml    Route: IM    Given by: Randell Loop CMA    Exp. Date: 10/16/2011    Lot #: [id number]    VIS given: 05/13/09 version given May 19, 2010.

## 2010-07-10 NOTE — Letter (Signed)
Summary: [facility]   Imported By: Marylou Mccoy 07/03/2010 15:45:10  _____________________________________________________________________  External Attachment:    Type:   Image     Comment:   External Document

## 2010-07-10 NOTE — Progress Notes (Signed)
Summary: lab results>phone tree  Phone Note Call from Patient Call back at Home Phone [phone]   Caller: Patient Call For: Dr. Kriste Basque Summary of Call: Patient phoned he had blood drawn at his last appointment earlier this month and he would lke the results. Patient can be reached at [phone] you can leave a message if he doesn't answer. Initial call taken by: Vedia Coffer,  May 28, 2010 3:00 PM  Follow-up for Phone Call        advised pt that results are on the phone tree. I verified this and message is available.Carron Curie CMA  May 28, 2010 4:24 PM

## 2010-07-10 NOTE — Medication Information (Signed)
Summary: rov/mw   Anticoagulant Therapy  Managed by: Weston Brass, PharmD Referring MD: Willa Rough MD PCP: Alroy Dust, MD Supervising MD: Daleen Squibb MD, Maisie Fus Indication 1: Atrial Fibrillation (ICD-427.31) Lab Used: [facility] Site: [facility] INR POC 2.3 INR RANGE 2 - 3  Dietary changes: no    Health status changes: no    Bleeding/hemorrhagic complications: no    Recent/future hospitalizations: no    Any changes in medication regimen? no    Recent/future dental: no  Any missed doses?: no       Is patient compliant with meds? yes       Allergies: 1)  ! * Tussionex  Anticoagulation Management History:      The patient is taking warfarin and comes in today for a routine follow up visit.  Negative risk factors for bleeding include an age less than 76 years old.  The bleeding index is 'low risk'.  Positive CHADS2 values include History of HTN.  Negative CHADS2 values include Age > 78 years old.  The start date was 08/23/2008.  Anticoagulation responsible Orian Figueira: Daleen Squibb MD, Maisie Fus.  INR POC: 2.3.  Cuvette Lot#: [id number].  Exp: 07/2011.    Anticoagulation Management Assessment/Plan:      The patient's current anticoagulation dose is Warfarin sodium 5 mg tabs: Use as directed by Anticoagulation Clinic.  The target INR is 2 - 3.  The next INR is due 06/20/2010.  Anticoagulation instructions were given to patient.  Results were reviewed/authorized by Weston Brass, PharmD.  He was notified by Weston Brass PharmD.         Prior Anticoagulation Instructions: INR 2.4 Continue taking 1 tablet everyday. Recheck in 4 weeks.  Current Anticoagulation Instructions: INR 2.3  Continue same dose of 1 tablet every day.  Recheck INR in 4 weeks.

## 2010-07-16 NOTE — Progress Notes (Signed)
Summary: pharmacy calling  Phone Note From Pharmacy   Caller: matt w/ [facility] Call For: parrett  Summary of Call: pharmacists calling re: rx for muscle relaxer. pt is at pharmacy to pick this up now so they ask that nurse call asap [phone] Initial call taken by: Tivis Ringer, CNA,  July 09, 2010 2:07 PM  Follow-up for Phone Call        called and spoke with matt and he is aware of rx being sent in electronically Randell Loop CMA  July 09, 2010 2:12 PM     Prescriptions: SOMA 350 MG TABS (CARISOPRODOL) Take 1 tablet by mouth three times a day - NOT TO EXCEED 3/d...  #90 x 5   Entered by:   Randell Loop CMA   Authorized by:   Michele Mcalpine MD   Signed by:   Randell Loop CMA on 07/09/2010   Method used:   Electronically to        [facility] [address] (retail)       [address]       [place], [place]  [address]       Ph: [phone]       Fax: [phone]   RxID:   [id number]

## 2010-07-17 DIAGNOSIS — I4891 Unspecified atrial fibrillation: Secondary | ICD-10-CM

## 2010-08-08 ENCOUNTER — Encounter: Payer: Self-pay | Admitting: Internal Medicine

## 2010-08-08 ENCOUNTER — Encounter (INDEPENDENT_AMBULATORY_CARE_PROVIDER_SITE_OTHER): Payer: BC Managed Care – PPO

## 2010-08-08 DIAGNOSIS — Z7901 Long term (current) use of anticoagulants: Secondary | ICD-10-CM

## 2010-08-08 DIAGNOSIS — I4891 Unspecified atrial fibrillation: Secondary | ICD-10-CM

## 2010-08-14 NOTE — Medication Information (Signed)
Summary: rov/ewj   Anticoagulant Therapy  Managed by: Bayard Hugger, PharmD Referring MD: Willa Rough MD PCP: Alroy Dust, MD Supervising MD: Gala Romney MD, Reuel Boom Indication 1: Atrial Fibrillation (ICD-427.31) Lab Used: [facility] Site: [facility] INR POC 2.2 INR RANGE 2 - 3  Dietary changes: no    Health status changes: no    Bleeding/hemorrhagic complications: no    Recent/future hospitalizations: no    Any changes in medication regimen? no    Recent/future dental: no  Any missed doses?: no       Is patient compliant with meds? yes       Allergies: 1)  ! * Tussionex  Anticoagulation Management History:      Negative risk factors for bleeding include an age less than 68 years old.  The bleeding index is 'low risk'.  Positive CHADS2 values include History of HTN.  Negative CHADS2 values include Age > 49 years old.  The start date was 08/23/2008.  Anticoagulation responsible provider: Jakolby Sedivy MD, Reuel Boom.  INR POC: 2.2.  Cuvette Lot#: [id number].  Exp: 06/2011.    Anticoagulation Management Assessment/Plan:      The patient's current anticoagulation dose is Warfarin sodium 5 mg tabs: Use as directed by Anticoagulation Clinic.  The target INR is 2 - 3.  The next INR is due 09/05/2010.  Anticoagulation instructions were given to patient.  Results were reviewed/authorized by Bayard Hugger, PharmD.         Prior Anticoagulation Instructions: INR 2.1  Coumadin 5 mg tablets - Continue 1 tablet every day   Current Anticoagulation Instructions: INR 2.2 Continue current dose: 1 tablet (5mg ) every day. Recheck INR in 4 weeks.

## 2010-09-05 ENCOUNTER — Ambulatory Visit (INDEPENDENT_AMBULATORY_CARE_PROVIDER_SITE_OTHER): Payer: BC Managed Care – PPO | Admitting: *Deleted

## 2010-09-05 DIAGNOSIS — Z7901 Long term (current) use of anticoagulants: Secondary | ICD-10-CM

## 2010-09-05 DIAGNOSIS — I4891 Unspecified atrial fibrillation: Secondary | ICD-10-CM

## 2010-09-05 LAB — POCT INR: INR: 2.5

## 2010-09-05 NOTE — Patient Instructions (Signed)
INR 2.5 Continue taking 1 tablet (5mg ) daily. Recheck in 4 weeks.

## 2010-09-29 ENCOUNTER — Telehealth: Payer: Self-pay | Admitting: Cardiology

## 2010-09-29 NOTE — Telephone Encounter (Signed)
Pt wants to know if he needs to get blood work done before his dr appt.

## 2010-09-29 NOTE — Telephone Encounter (Signed)
Pt calling asking if he needs lab work before o.v. with dr Sedonia Small, you were here 6 months ago--appears all testing done at that time should cover this visit--LM on identified answering machine--nt

## 2010-10-03 ENCOUNTER — Ambulatory Visit (INDEPENDENT_AMBULATORY_CARE_PROVIDER_SITE_OTHER): Payer: BC Managed Care – PPO | Admitting: *Deleted

## 2010-10-03 DIAGNOSIS — I4891 Unspecified atrial fibrillation: Secondary | ICD-10-CM

## 2010-10-03 LAB — POCT INR: INR: 2.4

## 2010-10-10 ENCOUNTER — Telehealth: Payer: Self-pay | Admitting: Pulmonary Disease

## 2010-10-10 MED ORDER — HYDROCODONE-ACETAMINOPHEN 5-500 MG PO TABS: ORAL_TABLET | ORAL | Status: DC

## 2010-10-10 NOTE — Telephone Encounter (Signed)
Rx was called to pharmacy and pt aware. Advised pt to keep pending appt and he verbalized understanding.

## 2010-10-10 NOTE — Telephone Encounter (Signed)
Ok to fill x 4 .  Pt needs to keep follow up in sept with Dr. Kriste Basque. thanks

## 2010-10-10 NOTE — Telephone Encounter (Signed)
Please advise if okay to refill vicodin for pt, thanks! Last ov 05/19/10 and next appt 02/11/11

## 2010-10-21 NOTE — Assessment & Plan Note (Signed)
[facility]                            CARDIOLOGY OFFICE NOTE   NAME:BEAVERHameed, Kolar                     MRN:          [id number]  DATE:08/31/2007                            DOB:          06-Apr-1951    HISTORY OF PRESENT ILLNESS:  Mr. Romig is doing well.  He is having  only rare palpitations.  He is very bothered by his weight and he  started to use Slim-Fast 2-3 meals a day.  This is an approach that  helps him, and I encouraged it strongly.  He has not had any chest pain.   PAST MEDICAL HISTORY:   ALLERGIES:  TUSSIONEX.   MEDICATIONS:  Etodolac, Simvastatin, alprazolam, multivitamin, aspirin,  fenofibrate and diltiazem 180.   OTHER MEDICAL PROBLEMS:  See the list on my note of April 13, 2007.   REVIEW OF SYSTEMS:  He is doing well and feeling well and his blood  pressure is better controlled.   PHYSICAL EXAMINATION:  VITAL SIGNS:  Blood pressure is 114/78 with a  pulse of 62.  His weight is 327 pounds.  As mentioned, he is tuning into  this finally and he has started to try to lose weight.  HEENT:  Reveals no xanthelasma.  Normal extraocular motion.  The are no  carotid bruits.  There is no jugular venous distention.  LUNGS:  Clear.  Respiratory effort is not labored.  CARDIAC:  Exam reveals S1-S2.  There are no clicks or significant  murmurs.  ABDOMEN:  Obese.  He has normal bowel sounds.  He has no peripheral  edema.   STUDIES:  EKG reveals no significant change.   PROBLEMS:  Problems are listed on the note of April 13, 2007.  #1.  Hypertension, treated.  #2.  Supraventricular tachycardia with some atrial fibrillation.  It is  felt that his [place] score is not high enough for Coumadin.  He is on  diltiazem and he is feeling well.  I will see him back in 6 months.     Luis Abed, MD, [facility]  Electronically Signed   JDK/MedQ  DD: 08/31/2007  DT: 08/31/2007  Job #: [id number]   cc:   Lonzo Cloud. Kriste Basque, MD

## 2010-10-21 NOTE — Assessment & Plan Note (Signed)
[facility]                            CARDIOLOGY OFFICE NOTE   NAME:Brendan, [name]                     MRN:          [id number]  DATE:04/13/2007                            DOB:          08/11/1950    [name] has known coronary disease, but he has been stable.  He  recently has been noting sensations with palpitations in his chest.  This can occur while sitting or while exercising.  He had had something  like this a year ago, when we assessed him, and his Myoview scan  revealed no ischemia.  He has not had syncope or presyncope.  He is  under a great deal of stress at work.  He also has continued to smoke  and has been gaining weight and concerned about all these things.  He  has not had any significant chest pain.  When he has the palpitations,  he does feel anxious and he becomes a little sweaty.   PAST MEDICAL HISTORY:   ALLERGIES:  TUSSIONEX.   MEDICATIONS:  1. Etodolac 400 b.i.d.  2. Simvastatin 40.  3. Alprazolam.  4. Aspirin 325.   OTHER MEDICAL PROBLEMS:  See the list below.   REVIEW OF SYSTEMS:  Other than the HPI, his review of systems is  negative.   PHYSICAL EXAM:  He had very careful orthostatic blood pressure analysis.  Lying pressure was 123/79, sitting 125/83, standing 134/88, standing two  minutes 138/92 and standing five minutes 142/92.  There was a mild  increase in his heart rate with these.  There was no obvious sign of  orthostasis here.  The patient is significantly overweight.  HEENT:  Reveals no xanthelasma.  He has normal extraocular motion.  There are no carotid bruits.  There is no jugular venous distention.  LUNGS:  Clear.  Respiratory effort is not labored.  Patient is oriented to person, time and place and his affect is normal.  CARDIAC EXAM:  Reveals an S1 and an S2.  There are no clicks or  significant murmurs.  ABDOMEN:  Obese, but soft.  He has no peripheral edema.  There are no musculoskeletal  deformities.   EKG today reveals sinus rhythm with no significant abnormalities.   Review of his Myoview scan from January of 2008 revealed that he walked  for 8-1/2 minutes.  He had no diagnostic EKG changes.  The ejection  fraction was 60%.  There was no scar or ischemia.   PROBLEMS INCLUDE:  1. Coronary disease.  He had stenting in the past.  His Myoview scan      in January of 2008 revealed no ischemia.  2. History of ejection fraction in the 55-60% range.  3. Hyperlipidemia.  He is on simvastatin 40.  In January of 2008, he      had relatively good control and he will have followup labs today.      We had switched him from Vytorin back to simvastatin.  4. Weight-gain.  He must begin to lose weight.  5. History of some anxiety and slight depression and this may be  playing a role with his current symptoms.  6. History of small granulomas by CT that requires no further workup.  7. History of supraventricular tachycardia in 2001.  We thought he was      not having any recurrences.  His current palpitations may well be      recurrent.  He needs to wear an event recorder, which will be      arranged.  TSH will also be checked.  8. History of borderline low testosterone that is followed by urology.  9. Borderline elevation in blood pressure, which is not an active      problem at this time.  10.Mildly reduced HDL.  We will re-review his labs as they are drawn.   He will have an event recorder, TSH, BMET, CBC and I will see him back  for followup.  I have encouraged him to see Dr. Kriste Basque also.   In addition, he has asked me about Chantix.  With recent reports about  potential long-term difficulties with Chantix, I will re-review this  with our pharmacology team and discuss it with him when I see him next  time.     Luis Abed, MD, [facility]  Electronically Signed    JDK/MedQ  DD: 04/13/2007  DT: 04/13/2007  Job #: [id number]   cc:   Lonzo Cloud. Kriste Basque, MD

## 2010-10-21 NOTE — Assessment & Plan Note (Signed)
[facility]                            CARDIOLOGY OFFICE NOTE   NAME:BEAVERArlin, Brendan [name]                     MRN:          [id number]  DATE:05/13/2007                            DOB:          Jan 08, 1951    [name] is back for followup.  I had seen him on April 13, 2007.  I  was concerned about the possibility of arrhythmias, and he worse an  event recorder.  This was extremely helpful and gave [place] the information  we needed.  He did have bursts of atrial fibrillation.  He was seen for  me in the office on April 21, 2007 by Dr. Daleen Squibb, who added Diltiazem.  Since that time, the patient has felt much better.  The Diltiazem helps  control the rate when he goes into atrial fibrillation.  The patient's  [place] score is low.  He is not taking Coumadin at this time.  He also  does not want to take it at this point and I decided not to push it.  We  also talked about whether other antiarrhythmics would be helpful and we  both agree at this point that we will follow him without any other meds  at this point.   PAST MEDICAL HISTORY:   ALLERGIES:  TUSSIONEX.  (He has had hives from this.)   MEDICATIONS:  1. Simvastatin 40.  2. Alprazolam.  3. Multivitamin.  4. Aspirin 325.  5. Fenofibrate.  6. Diltiazem 180.   OTHER MEDICAL PROBLEMS:  See the complete list on the note of April 13, 2007.   REVIEW OF SYSTEMS:  He feels much better.  He has not had palpitations.  He has not had episodes of feeling sweaty.  His review of systems is  otherwise negative.   PHYSICAL EXAM:  Weight is 319 pounds, which is unchanged.  Blood  pressure is 115/74 with a pulse of 65.  The patient is oriented to person, time, and place.  Affect is normal.  HEENT:  Reveals no xanthelasma.  He has normal extraocular motions.  There are no carotid bruits.  There is no jugular venous distension.  LUNGS:  Clear.  Respiratory effort is not labored.  CARDIAC:  Reveals an S1 with an  S2.  There are no clicks or significant  murmurs.  ABDOMEN:  Obese, but soft.  He has no peripheral edema.   The patient's CardioNet revealed that he did have several episodes of  atrial fibrillation.  He wore the monitor for 19 days.  On 4 of the  days, he had significant atrial fibrillation.  On November 13, 24% of  the day was atrial fibrillation, November 15, 36%, November 20, 50%,  November 21, 65%.  The atrial fibrillation rate, however, was fastest on  November 13, and then it was better controlled over time.   PROBLEMS:  Listed on the note of April 13, 2007.  1. History of supraventricular tachycardia in 2001.  Recent      palpitations and probably related to his atrial fibrillation.  On      Diltiazem.  This is better controlled.  See the discussion above.   Overall, he is stable.  I will see him back in 3 months.  We will keep  in mind other medications and the possibility of Coumadin over time.     Brendan Abed, MD, [facility]  Electronically Signed    JDK/MedQ  DD: 05/13/2007  DT: 05/13/2007  Job #: [id number]   cc:   Lonzo Cloud. Kriste Basque, MD

## 2010-10-21 NOTE — Assessment & Plan Note (Signed)
[facility]                            CARDIOLOGY OFFICE NOTE   NAME:Brendan [name], Brendan [name]                     MRN:          [id number]  DATE:04/21/2007                            DOB:          July 04, 1950    I received a CardioNet monitor this morning on [name] ordered by Dr.  Myrtis Ser.  It shows an episode of atrial fibrillation and rate of about 150  beats per minute.   He actually felt weak and felt something bad was going to happen, and  felt a bit sweaty with these events.  He was not syncopal or pre-  syncopal, however.   Dr. Myrtis Ser suspected some sort of supraventricular arrhythmia per his note  on April 13, 2007.  Please refer to his problem list as well.   His blood count was normal.  Metabolic panel was normal.  Lipids looked  as though he was not taking simvastatin.  His TSH was 1.56.   He feels fine right now.  He says he gets very angry when this happens  because he cannot do what he wants to do.   CURRENT MEDICATIONS:  1. Etodolac 400 mg b.i.d.  2. Simvastatin 40 mg p.o. nightly.  3. Alprazolam 0.5 mg t.i.d.  4. Multivitamin.  5. Aspirin 325 mg a day.  6. Fenofibrate 160 mg a day.   EXAM:  His blood pressure today is 115/80, pulse 67, and he is in sinus  rhythm.  Weight is 319.  The rest of the exam is unchanged.   I have had about a 20 minute discussion with [name] today.  I think  with his heart rate already in the low 60s, not to mention his problems  with erectile dysfunction already, that a beta blocker is not his best  first choice.  I have put him on Diltiazem extended release 180 mg p.o.  q. a.m.  I will have to watch his blood pressure.  In addition, he is  taking 325 mg of aspirin, which I have told him to continue.  His CHAD2  score is low.  We will obtain a 2D echocardiogram between now and the  time he sees Dr. Myrtis Ser on May 13, 2007.     Thomas C. Daleen Squibb, MD, [facility]  Electronically Signed    TCW/MedQ  DD:  04/21/2007  DT: 04/22/2007  Job #: [id number]   cc:   Lonzo Cloud. Kriste Basque, MD

## 2010-10-21 NOTE — Assessment & Plan Note (Signed)
[facility]                            CARDIOLOGY OFFICE NOTE   NAME:BEAVERYonathan, Perrow                     MRN:          [id number]  DATE:08/23/2008                            DOB:          06/09/1950    Brendan [name] is seen for cardiology followup.  He has coronary disease.  He also has paroxysmal atrial fibrillation.  He has several new problems  that he is telling me about today.  The patient had an episode of  confusion in December 2009.  He was seen in the emergency room with a CT  scan that was negative by the emergency room.  It was thought that he  may have had a TIA.  He did not want to take Plavix.  Dr. Kriste Basque saw him  and arranged for an MRI.  This was done on June 05, 2008, and showed  minimal atrophy and no acute infarct.  He has not had any further  confusion problems.  An additional new problem is an episode of blurred  vision.  Etiology is not clear.  This resolved rapidly.  Next,  additional new problem is ongoing worsening back pain.  This will be  followed by his other physicians.  Next, additional issue is question of  sleep apnea.  He mentions today that he does have excess snoring.  He  does not meet all criteria.  Dr. Kriste Basque and I will decide over time  whether a sleep study is appropriate.   The patient has had increased episodes of atrial fibrillation.  We did  document this with a monitor.  Previously, we had chosen not to use  Coumadin, but now it will definitely be necessary.  Also, we will need  to adjust meds concerning this.   PAST MEDICAL HISTORY:  Allergies, he has had hives from TUSSIONEX in the  past.   MEDICATIONS:  1. Aspirin.  2. Diltiazem 180.  3. Simvastatin 80.  4. Fenofibrate 160.  5. Omeprazole.  6. Viagra as directed.  7. Etodolac.  8. Vicodin.  9. Soma.  10.Alprazolam.  11.Multivitamin.   OTHER MEDICAL PROBLEMS:  See the complete list below.   REVIEW OF SYSTEMS:  He is not having any fevers or  chills.  He is losing  weight by his design and we are very pleased about this.  He has no  headaches.  As mentioned, he has had some blurred vision.  He has no  chest pain.  There is no shortness of breath.  He has no GI or GU  symptoms.  There is no peripheral edema.  There are no musculoskeletal  deformities.  He is having low back pain as mentioned.  Otherwise,  review of systems is negative.   PHYSICAL EXAMINATION:  VITAL SIGNS:  Blood pressure is 130/80 with a  pulse of 67.  GENERAL:  The patient is oriented to person, time, and place.  Affect is  normal.  He is very concerned about his current medical problems.  HEENT:  No xanthelasma.  He has normal extraocular motions.  NECK:  There are no carotid bruits.  There  is no jugular venous  distention.  LUNGS:  Clear.  Respiratory effort is not labored.  CARDIAC:  Reveals an S1 with an S2.  There are no clicks or significant  murmurs.  ABDOMEN:  Soft.  EXTREMITIES:  He has no significant peripheral edema.   EKG today reveals sinus rhythm.   PROBLEMS:  1. Hypertension treated.  2. Coronary artery disease.  This has been stable.  We will have to      keep this in mind as we proceed with the choice of medicines for      his other problems.  3. Atrial fibrillation.  He is having more symptoms.  We will add beta-      blocker and digoxin.  I will not yet add an antiarrhythmic.  We      will see him for early followup.  4. Hyperlipidemia, on medications.  5. Obesity.  He is losing weight.  6. Back pain.  This is being assessed.  7. New problem with dizziness in December and then some blurred      vision.  There is no proof that either of these episodes are from      an embolic event; however with his paroxysmal atrial fib it is now      time to start Coumadin and this will be done.  Also, I will arrange      for Neurology evaluation to see if we can help with his current      problems.  The patient will have a fasting lipid  profile.  I will      see him for early followup.   EKG was done was reviewed by me.  He has sinus rhythm.  Also I have  reviewed Dr. Jodelle [name] report from the assessment of prior problems at  great length.  I have also had a prolonged discussion with the patient  about all of the problems listed above.  I have advised him about his  atrial fibrillation.  We talked about other procedures over time, but  for now we will adjust his medicines.     Brendan Abed, MD, [facility]  Electronically Signed    JDK/MedQ  DD: 08/23/2008  DT: 08/23/2008  Job #: [id number]   cc:   Lonzo Cloud. Kriste Basque, MD  Genene Churn. Love, M.D.

## 2010-10-21 NOTE — Assessment & Plan Note (Signed)
[facility]                            CARDIOLOGY OFFICE NOTE   [name], Brendan [name]                     MRN:          [id number]  DATE:02/09/2008                            DOB:          04/01/1951    Brendan [name] is seen for followup.  He saw Dr. Kriste [name] earlier this morning.  His LDL remained elevated.  His simvastatin dose is being increased to  80, and he will remain on fenofibrate.  Unfortunately, he has not been  able to lose weight.  He is quite concerned about this.  He knows,  however, that with the many stresses of life that it is difficult for  him to cut back on his calories.  He will continue to work on this the  best he can overtime.   He has had some mild palpitations, which is probably short burst of  atrial fibrillation.  He  had one episode associated with a family  member with a severe illness.  Usually, he is felt this rhythm when he  is doing physical activity, but at this time, it was related to  significant stress.  He is stable now.   ALLERGIES:  He had hives on TUSSIONEX.   MEDICATIONS:  1. Etodolac.  2. Alprazolam.  3. Aspirin.  4. Simvastatin 80 with this dose being started today.  5. Fenofibrate 160.  6. Diltiazem 180.   OTHER MEDICAL PROBLEMS:  See the list in my note of April 13, 2007.   REVIEW OF SYSTEMS:  Other than the HPI, his review of systems is  negative.   PHYSICAL EXAMINATION:  VITAL SIGNS:  Blood pressure is 124/80 with a  pulse of 60.  His weight is 318 pounds.  This actually is down a few  pounds since his last visit.  GENERAL:  The patient is oriented to person, time, and place.  Affect is  normal.  HEENT:  No xanthelasma.  He has normal extraocular motion.  NECK:  There are no carotid bruits.  There is no jugular venous  distention.  LUNGS:  Clear.  Respiratory effort is nonlabored.  CARDIAC:  S1 with an S2.  There are no clicks or significant murmurs.  ABDOMEN:  Soft.  EXTREMITIES:  There is  no peripheral edema.   EKG today reveals sinus rhythm.   Problems are listed in the note of April 13, 2007.  1. Hypertension, treated.  2. Supraventricular tachycardia with paroxysmal atrial fibrillation.      He has some mild symptoms.  He is not on Coumadin and does not need      [place] score criteria this time.  3. Obesity.  The patient has significant stress.  We talked about      trying to lose weight the best he can.  He knows that we are only      trying to be supportive.  It will be up to him to get into a      program to help.   No change in his cardiac meds other than the increase in his  simvastatin.     Brendan [name].  Brendan Ser, MD, [facility]  Electronically Signed    JDK/MedQ  DD: 02/09/2008  DT: 02/10/2008  Job #: [id number]

## 2010-10-22 ENCOUNTER — Telehealth: Payer: Self-pay | Admitting: Pulmonary Disease

## 2010-10-22 MED ORDER — DIGOXIN 250 MCG PO TABS: 250.0000 ug | ORAL_TABLET | Freq: Every day | ORAL | Status: DC

## 2010-10-22 NOTE — Telephone Encounter (Signed)
DIGOXIN 0.25 MG LAST WEEK X 3 AND HAVE NOT RECEIVED REFILL AUTH PT STATED THAT HE IS COMPLETELY OUT AND HAS BEEN FOR A WEEK. HE USES [facility] HE CAN BE REACHED AT [phone]

## 2010-10-22 NOTE — Telephone Encounter (Signed)
Pt aware rx was sent to pharmacy. Nothing further was needed

## 2010-10-24 NOTE — Discharge Summary (Signed)
[place]  Patient:    Brendan [name], Brendan [name]                       MRN: [id number] Adm. Date:  [id number] Disc. Date: 12/20/99 Attending:  Alric Quan Dictator:   Abelino Derrick, P.A.C. [facility] CC:         Luis Abed, M.D. [facility] at office                  Referring Physician Discharge [place]  DISCHARGE DIAGNOSES: 1.Abnormal Cardiolite scan prior to admission, with elective staged two-vessel   intervention this admission. 2. Paroxysmal supraventricular tachycardia on stress test. 3. Hyperlipidemia. 4. History of smoking.  HOSPITAL COURSE:  Brendan [name] is a 60 year old male followed by Dr. Kriste Basque.  He was referred for a stress test and had SVT with rates up to 200 associated with dizziness.  Cardiolite images were abnormal, with inferior basilar ischemia.  Patient was started on beta blocker as an outpatient and set up for elective catheterization.  This was done December 17, 1999 by Dr. Corinda Gubler, which revealed three-vessel disease with a 70% LAD, 80% circumflex, and 80% RCA, with normal LV function.  His films were reviewed carefully by Dr. Corinda Gubler and Dr. Juanda Chance, and it was felt that percutaneous intervention could be done.  The patient had a stent placed to his circumflex December 18, 1999 by Dr. Juanda Chance. This required two stents.  This was a somewhat difficult procedure, and Dr. Juanda Chance put off the RCA intervention until July 13.  On December 19, 1999 he underwent stent placement to the RCA without complications.  Plan is for four weeks of Plavix.  He will follow up with Dr. Myrtis Ser in about two weeks.  The patient is a smoker and has expressed his desire to quit, and has requested something to help.  He has been given Wellbutrin at discharge.  DISCHARGE MEDICATIONS: 1. Plavix 75 mg a day for four weeks. 2. Wellbutrin SR 150 mg q.d. for three days and then b.i.d.. 3. Nitroglycerin sublingual p.r.n. 4. Lipitor 10 mg a day. 5. Atenolol 25 mg a day. 6.  Coated aspirin q.d.  LABORATORY:  White count 5.6, hemoglobin 15.8, hematocrit 41.5, platelet count 216.  Sodium 138, potassium 3.6, BUN 16, creatinine 0.9.  CK-MB and troponins are negative.  Lipid profile shows an HDL of 27, LDL 92.  EKG shows normal sinus rhythm without acute changes.  DISPOSITION:  Patient discharged in stable condition.  FOLLOW-UP:  With Dr. Myrtis Ser.  On follow-up, Dr. Corinda Gubler had mentioned possibly starting an ACE inhibitor.  We may also want to consider changing his Lipitor to Niaspan.  This is to be addressed as an outpatient when he sees Dr. Myrtis Ser. DD:  12/20/99 TD:  12/20/99 Job: [id number]

## 2010-10-24 NOTE — Procedures (Signed)
[place]. [facility]  Patient:    Brendan [name], Brendan [name]                       MRN: [id number] Adm. Date:  [id number] Attending:  Alric Quan CC:         Lonzo Cloud. Kriste Basque, M.D. [facility]             Luis Abed, M.D. [facility]             Cardiac Catheterization Lab                           Procedure Report  HISTORY:  The patient is a 60 year old white married male with multiple risk factors, history of dizziness, palpitations, with documented SVT on treadmill and inferobasal ischemia on the Cardiolite.  It appears the patient has never had chest discomfort.  PROCEDURES:  Selective coronary angiography, left ventricular angiography, abdominal aortography, and left internal mammary artery angiography-Judkins technique.  RESULTS:  Pressures:  LV systolic 25, 125 diastolic.  AO systolic 125, diastolic 76.  Angiography:  There is minor calcium in the proximal coronary arteries. 1. The left main coronary is normal. 2. The left anterior descending artery had a segmental stenosis of 70% at    the level of the first septal extending to the second diagonal. 3. The circumflex has a proximal lesion of 80%.  It is just after the takeoff    from the left main and is somewhat tortuous. 4. The right coronary artery has a segmental 70-80% stenosis. 5. The left ventricle is normal. 6. The abdominal aortogram is normal. 7. The LIMA is patent.  SUMMARY:  A patient with moderate to severe three-vessel coronary artery disease with most significant lesion in the circumflex.  He had a Cardiolite that suggested decreased perfusion in the right coronary artery.  Cines were reviewed with Drs. Brodie and Pulsipher, and there will be further review and then a decision concerning whether to proceed with percutaneous intervention of the circumflex, RCA, or CABG. DD:  12/17/99 TD:  12/17/99 Job: [id number]

## 2010-10-24 NOTE — Procedures (Signed)
[place]. [facility]  Patient:    Brendan [name], Brendan [name]                       MRN: [id number] Proc. Date: 12/17/99 Adm. Date:  [id number] Attending:  Alric Quan CC:         Cecil Cranker, M.D. [facility]             Luis Abed, M.D. [facility]             Cardiac Catheterization Lab                           Procedure Report  CLINICAL HISTORY:  Mr. Rigor is 60 years old and has no prior history of known heart disease.  He has multiple risk factors and recently underwent a Cardiolite scan which was abnormal for ischemia.  He was studied by Dr. Corinda Gubler, and there was a discussion on whether he should have bypass surgery versus percutaneous intervention.  We were leaning toward bypass surgery, but the patient had a strong preference to have [place] treat him with percutaneous intervention, and we agreed to do this today.  We had planned to treat the circumflex artery and if this procedure went well, the right coronary artery.  DESCRIPTION OF PROCEDURE:  The procedure was performed via the right femoral artery using an arterial sheath and 6-French preformed coronary catheters.  A front wall arterial puncture was performed, and Hexabrix contrast was used. The patient was given weight-adjusted heparin to prolong an ACT of greater than 200 seconds and was given double bolus Integrilin in infusion.  We used a 7-French 3.5 Voda guiding catheter and a short Patriot wire.  There was a sharp bend into the circumflex artery, and then the lesion was located in the proximal circumflex artery and also had a 90-degree bend right at the lesion. We were able to cross the lesion with the wire without much difficulty.  We predilated with a 3.0 x 20-mm Quantum Ranger, performing two inflations up to 10 atmospheres for 40 seconds.  We then deployed a 3.5 x 15-mm S7 AVE stent with one inflation of 12 atmospheres for 45 seconds.  This resulted in a small to moderate proximal edge tear.   We elected to treat this with a second stent. We used an 3.5 x 8-mm Tetra.  This was somewhat difficult to position because the stent was positioned horizontally and the vessel segment was positioned vertically.  We positioned the stent so that the edge was just short of the left main and deployed this with one inflation of 12 atmospheres for 40 seconds.  Repeat diagnostic studies were then performed through the guiding catheter.  In order to be sure we had adequate deployment, we attempted to perform IVUS but were unable to advance the IVUS catheter around the bend and into the circumflex artery.  Because we had to use the second stent overlapping the first for an edge tear, we felt it was safest to stabilize this lesion before treating the right coronary lesion.  RESULTS:  Initially, the stenosis in the proximal circumflex artery was estimated at 80% and was located right in the bend in the proximal circumflex artery.  Following stenting, this improved to 0%.  There was minimal irregularity proximal to the most proximal stent following the procedure, but the lumen was widely patent.  CONCLUSIONS:  Successful stenting of the proximal circumflex artery stenosis  with improvement in percent area narrowing from 80% to 0%.  DISPOSITION:  The patient was returned to the post anesthesia unit.  We will plan to bring the patient back tomorrow for a re-look and plan to treat the right coronary artery if the circumflex artery remains stable. DD:  12/18/99 TD:  12/18/99 Job: [id number]

## 2010-10-24 NOTE — Op Note (Signed)
[place]. [facility]  Patient:    Brendan [name], Brendan [name]                       MRN: [id number] Proc. Date: 12/19/99 Adm. Date:  [id number] Attending:  Alric Quan CC:         Everardo Beals. Juanda Chance, M.D. [facility]             Luis Abed, M.D. [facility]             E. Graceann Congress, M.D. [facility]             Cardiopulmonary Lab             [employer]. Kriste Basque, M.D. [facility]                           Operative Report  CLINICAL HISTORY:  Mr. Daigler is 60 years old and has positive risk factors for coronary disease.  He recently underwent a Cardiolite scan, which suggested inferobasilar ischemia.  He was studied two days ago by Dr. Corinda Gubler and found to have three-vessel coronary artery disease, with the tightest blockage being in the circumflex and right coronary arteries.  These two lesions were somewhat difficult for percutaneous intervention because of marked tortuosity and calcification.  We initially recommended surgery, but Mr. Barnhart was very reluctant to have this done.  We decided to proceed with percutaneous intervention.  We did an intervention on the circumflex artery yesterday, but had to use a second stent for a small edge tear in the very proximal circumflex artery; we felt that it was best to stage the right coronary artery for today.  PROCEDURAL NOTE:  The procedure was performed by the left femoral artery using an arterial sheath.  A front wall arterial puncture was performed and Omnipaque contrast was used.  We initially performed diagnostic studies of the circumflex artery to assess intervention done yesterday.  We then proceeded with intervention on the right coronary artery.  The patient was given weight-adjusted Heparin to prolong the ACT to greater than 200 seconds, and had been on an Integrilin drip from the previous intervention.  We used a JR4 7-French guiding catheter with side holes, and a Patriot short wire.  We crossed the lesion in the right  coronary artery with the wire without much difficulty.  The wire straightened the tortuosity and resulted in numerous pseudo lesions.  We were able to get a 2.5 x 15 mm Quantam Ranger to the lesion without much difficulty.  We performed one inflation of 7 atm for 45 sec.  We then deployed a 2.5 x 12 mm S7 AVE stent.  We had a mild amount of difficulty getting the stent through the tortuous calcified vessel to the lesion, but accomplished this without too much delay.  We deployed the stent with one inflation of 12 atm for 45 sec. Repeat diagnostic study was performed through the guiding catheter.  The patient tolerated the procedure well and left the laboratory in satisfactory condition.  RESULTS:  Initially the stenosis was estimated at 80% and was located at a bend in the mid portion of the right coronary artery.  Following stenting this improved to 0%.  There was residual disease estimated at about 50% proximal to the stent.  CONCLUSION:  Successful stent placement in the mid right coronary artery. There was improvement in percent luminal narrowing from 80% to 0%.  DISPOSITION:  The  patient was brought to the post-anesthesia unit for further observation.  We closed the right femoral artery with Perclose at the end of the procedure. DD:  12/19/99 TD:  12/19/99 Job: [id number]

## 2010-10-24 NOTE — Assessment & Plan Note (Signed)
[facility]                            CARDIOLOGY OFFICE NOTE   NAME:Brendan [name], Brendan [name]                     MRN:          [id number]  DATE:06/14/2006                            DOB:          1951-04-15    Brendan [name] is doing well.  He has coronary disease with stenting in the  past.  He has not had any significant chest pain or any significant  shortness of breath.  He had one episode while playing golf on a very  hot day this past summer where he felt dizziness and it persisted  through the day.  However, he recovered and he has had no significant  recurrence.   He has tried to lose weight.  He is down a few pounds.  He knows he  needs to do better.  He continues on his Vytorin.  It is time to check a  fasting lipid.   ALLERGIES:  TUSSIONEX.   MEDICATIONS:  1. Etodolac 400 mg b.i.d.  2. Omeprazole 20 mg.  3. Levitra p.r.n.  4. Aspirin 81 mg.  5. Vytorin 10/40 mg.   OTHER MEDICAL PROBLEMS:  See the complete list below.   REVIEW OF SYSTEMS:  He is not having any major headaches or eye  problems.  He has no shortness of breath.  He is not having any GI or GU  problems.  He has no major musculoskeletal difficulties.  Overall the  remainder of his review of systems is negative.   PHYSICAL EXAMINATION:  VITAL SIGNS:  The patient is significantly  overweight at 317 pounds.  Blood pressure is 137/90.  We will recheck  his pressure when he has a follow up exercise test.  His pressure has  not been high in the past.  Pulse is 70.  GENERAL:  The patient is orientated to person, time and place.  Affect  is normal.  EYES:  Reveal no xanthelasma.  He has normal extraocular motion.  NECK:  He has no carotid bruit.  There is no jugular venous distension.  LUNGS:  Clear.  RESPIRATORY:  Effort is not labored.  BACK:  He has no kyphosis or scoliosis.  CARDIAC:  Reveals an S1, S2.  There are no clicks or significant  murmurs.  ABDOMEN:  Obese.  There are  no masses or bruits.  EXTREMITIES:  He has trace peripheral edema.  MUSCULOSKELETAL:  No major deformities.   EKG reveals mild decrease in his anterior R-wave progression.  There is  no diagnostic change.   PROBLEMS:  1. Coronary disease.  He had stenting in the past.  His last exercise      test was done in 2003.  It is now 5 years and time for follow up      with this.  He will have a walk on the treadmill Myoview scan.  2. History of ejection fraction in the 55% range.  3. Hyperlipidemia.  He is on Vytorin and it is important for him to      remain on this.  We will do a follow up fasting lipid and discuss  this with him after we have the results of his Myoview.  4. Weight gain.  He and I once again discussed his weight.  It would      be very important for him to try to lose weight.  5. History of some anxiety and slight depression with multiple stress      factors in the past.  He is doing better in this regard.  6. Smoking history.  As always he is encouraged not to smoke.  7. History of small granulomas by CT in the past and no further work      up was needed.  8. History of supraventricular tachycardia in 2001 with no recurrence.  9. History of borderline low testosterone that is followed by urology.  10.Borderline elevation of his blood pressure at times in the past      that has been under control although his pressure today is somewhat      elevated.  11.Low HDL.   He will have a Myoview scan.  He will have lipid analysis.  I will then  be in touch with him and we will decide about any further changes in his  medications.     Luis Abed, MD, [facility]  Electronically Signed    JDK/MedQ  DD: 06/14/2006  DT: 06/14/2006  Job #: [id number]

## 2010-10-30 ENCOUNTER — Encounter: Payer: Self-pay | Admitting: Cardiology

## 2010-10-30 ENCOUNTER — Ambulatory Visit (INDEPENDENT_AMBULATORY_CARE_PROVIDER_SITE_OTHER): Payer: BC Managed Care – PPO | Admitting: *Deleted

## 2010-10-30 DIAGNOSIS — I4891 Unspecified atrial fibrillation: Secondary | ICD-10-CM

## 2010-11-06 ENCOUNTER — Encounter: Payer: Self-pay | Admitting: Cardiology

## 2010-11-06 DIAGNOSIS — E538 Deficiency of other specified B group vitamins: Secondary | ICD-10-CM | POA: Insufficient documentation

## 2010-11-06 DIAGNOSIS — E785 Hyperlipidemia, unspecified: Secondary | ICD-10-CM | POA: Insufficient documentation

## 2010-11-06 DIAGNOSIS — I2510 Atherosclerotic heart disease of native coronary artery without angina pectoris: Secondary | ICD-10-CM | POA: Insufficient documentation

## 2010-11-06 DIAGNOSIS — E669 Obesity, unspecified: Secondary | ICD-10-CM | POA: Insufficient documentation

## 2010-11-06 DIAGNOSIS — I10 Essential (primary) hypertension: Secondary | ICD-10-CM | POA: Insufficient documentation

## 2010-11-10 ENCOUNTER — Ambulatory Visit (INDEPENDENT_AMBULATORY_CARE_PROVIDER_SITE_OTHER): Payer: BC Managed Care – PPO | Admitting: Cardiology

## 2010-11-10 ENCOUNTER — Encounter: Payer: Self-pay | Admitting: Cardiology

## 2010-11-10 DIAGNOSIS — I2510 Atherosclerotic heart disease of native coronary artery without angina pectoris: Secondary | ICD-10-CM

## 2010-11-10 DIAGNOSIS — I4891 Unspecified atrial fibrillation: Secondary | ICD-10-CM

## 2010-11-10 DIAGNOSIS — I4892 Unspecified atrial flutter: Secondary | ICD-10-CM

## 2010-11-10 DIAGNOSIS — I480 Paroxysmal atrial fibrillation: Secondary | ICD-10-CM

## 2010-11-10 NOTE — Progress Notes (Signed)
HPI The patient is seen today for cardiology followup.  He has known coronary disease.  He had a stent placed in 2001.  Nuclear scan in June, 2010, revealed no ischemia.  He has had atrial fibrillation that is paroxysmal.  He is on Coumadin.  He's had no significant clinical episodes.  I have had him on medications for rate control but he has not required an antiarrhythmic.  Today he is feeling well but he knows that his rhythm is irregular.  EKG shows atrial flutter. This rhythm has not been seen before. Allergies  Allergen Reactions  . Tussionex Pennkinetic Er     REACTION: hives    Current Outpatient Prescriptions  Medication Sig Dispense Refill  . ALPRAZolam (XANAX) 0.5 MG tablet Take 1 tablet by mouth Three times daily as needed.      Marland Kitchen aspirin 81 MG tablet Take 81 mg by mouth daily.        . Calcium Carbonate-Vitamin D (CALCIUM 600+D) 600-400 MG-UNIT per tablet Take 1 tablet by mouth 2 (two) times daily.        . carisoprodol (SOMA) 350 MG tablet Take 1 tablet by mouth Three times daily as needed.      . cholecalciferol (VITAMIN D) 1000 UNITS tablet Take 1,000 Units by mouth daily.        . CRESTOR 40 MG tablet Take 1 tablet by mouth daily.      . digoxin (LANOXIN) 0.25 MG tablet Take 1 tablet (250 mcg total) by mouth daily.  30 tablet  4  . diltiazem (CARDIZEM CD) 180 MG 24 hr capsule Take 1 tablet by mouth daily.      Marland Kitchen etodolac (LODINE) 400 MG tablet Take 1 tablet by mouth Twice daily as needed.      . fenofibrate 160 MG tablet Take 1 tablet by mouth daily.      Marland Kitchen HYDROcodone-acetaminophen (VICODIN) 5-500 MG per tablet 1 every 6 hours as needed for pain- do not exceed 3 tablets per day  90 tablet  4  . metoprolol (TOPROL-XL) 50 MG 24 hr tablet Take 1 tablet by mouth daily.      . Multiple Vitamin (MULTIVITAMIN) tablet Take 1 tablet by mouth daily.        Marland Kitchen omeprazole (PRILOSEC) 20 MG capsule Take 1 tablet by mouth daily.      . vitamin B-12 (CYANOCOBALAMIN) 500 MCG tablet Take 500  mcg by mouth daily.        Marland Kitchen warfarin (COUMADIN) 5 MG tablet Take by mouth as directed.          History   Social History  . Marital Status: Married    Spouse Name: N/A    Number of Children: N/A  . Years of Education: N/A   Occupational History  . Not on file.   Social History Main Topics  . Smoking status: Current Everyday Smoker -- 0.3 packs/day  . Smokeless tobacco: Not on file  . Alcohol Use: 2.5 oz/week    5 drink(s) per week  . Drug Use: Not on file  . Sexually Active: Not on file   Other Topics Concern  . Not on file   Social History Narrative  . No narrative on file    Family History  Problem Relation Age of Onset  . Stroke Mother   . Alzheimer's disease Mother   . Heart failure Father   . Stroke Father   . Dementia    . Clotting disorder  Past Medical History  Diagnosis Date  . Pulmonary nodule   . HTN (hypertension)   . CAD (coronary artery disease)     stent, 2001 / nuclear, June, 2010, no ischemia  . PAF (paroxysmal atrial fibrillation)   . Dyslipidemia   . Obesity   . Diverticulosis of colon   . History of colonic polyps   . BPH (benign prostatic hypertrophy)   . Testosterone deficiency   . ED (erectile dysfunction)   . Lumbar back pain   . Peripheral neuropathy     lower extremities  . Anxiety and depression   . Vitamin D deficiency   . Blurred vision     episode with confusion, evaluated Dr. Sandria Manly  . Ejection fraction     EF 60%, echo, November, 2008  . B12 deficiency   . Scalp lesion     November, 2011  . Warfarin anticoagulation   . Atrial flutter     New diagnosis November 10, 2010, rate controlled    Past Surgical History  Procedure Date  . Tonsillectomy   . Clavicle surgery   . Arm fracture surgery     ROS  Patient denies fever, chills, headache, sweats, rash, change in vision, change in hearing, chest pain, cough, nausea vomiting, urinary symptoms.  All other systems are reviewed and are negative.  The patient does  have significant stress at home trying to help take care of his wife.  PHYSICAL EXAM Patient looks good today. He is oriented to person time and place.  Affect is normal.  Head is atraumatic.  There is no xanthelasma.  There is no jugular venous distention.  Lungs are clear.  Respiratory effort is nonlabored.  Cardiac exam reveals an S1-S2.  There are no clicks or significant murmurs.  The abdomen is soft.  There is no peripheral edema.  There are no musculoskeletal deformities.  There are no skin rashes. Filed Vitals:   11/10/10 0854  BP: 100/70  Pulse: 56  Height: 6\' 6"  (1.981 m)  Weight: 269 lb (122.018 kg)    EKG is done today and reviewed by me.  The patient has atrial flutter that has not been seen before.  The rate is controlled.  ASSESSMENT & PLAN

## 2010-11-10 NOTE — Assessment & Plan Note (Signed)
The patient has had paroxysmal atrial fibrillation in the past.  He is on Coumadin.

## 2010-11-10 NOTE — Assessment & Plan Note (Signed)
Atrial flutter is a new diagnosis as of today.  The patient is not having chest pain or shortness of breath.  There's been no syncope or presyncope.  He does not have significant palpitations.  It is possible that he goes in and out of this rhythm.  He is not symptomatic and the rate is not fast.  I am hesitant to an antiarrhythmic at this time.  In his case I feel that consideration of possible atrial flutter ablation as a primary therapy might be very appropriate.  Considering all of his medical and home stresses, it may be best to try to treat this problem definitively sooner rather than later.  I am making him an appointment for electrophysiology consideration.

## 2010-11-10 NOTE — Assessment & Plan Note (Signed)
Coronary disease is stable.  He had a nuclear scan in June, 2010.  He does not need a followup study at this time.

## 2010-11-10 NOTE — Patient Instructions (Signed)
You have been referred to Dr Graciela Husbands for a-flutter Your physician recommends that you schedule a follow-up appointment in: 3 months with Dr Graciela Husbands

## 2010-11-27 ENCOUNTER — Ambulatory Visit (INDEPENDENT_AMBULATORY_CARE_PROVIDER_SITE_OTHER): Payer: BC Managed Care – PPO | Admitting: Internal Medicine

## 2010-11-27 ENCOUNTER — Ambulatory Visit (INDEPENDENT_AMBULATORY_CARE_PROVIDER_SITE_OTHER): Payer: BC Managed Care – PPO | Admitting: *Deleted

## 2010-11-27 ENCOUNTER — Encounter: Payer: BC Managed Care – PPO | Admitting: *Deleted

## 2010-11-27 ENCOUNTER — Encounter: Payer: Self-pay | Admitting: Internal Medicine

## 2010-11-27 VITALS — BP 114/62 | HR 49 | Ht 78.0 in | Wt 270.8 lb

## 2010-11-27 DIAGNOSIS — I480 Paroxysmal atrial fibrillation: Secondary | ICD-10-CM

## 2010-11-27 DIAGNOSIS — I4892 Unspecified atrial flutter: Secondary | ICD-10-CM

## 2010-11-27 DIAGNOSIS — I4891 Unspecified atrial fibrillation: Secondary | ICD-10-CM

## 2010-11-27 DIAGNOSIS — I2510 Atherosclerotic heart disease of native coronary artery without angina pectoris: Secondary | ICD-10-CM

## 2010-11-27 MED ORDER — RIVAROXABAN 20 MG PO TABS: 1.0000 | ORAL_TABLET | Freq: Every day | ORAL | Status: DC

## 2010-11-27 NOTE — Assessment & Plan Note (Signed)
Stable on current medication.  

## 2010-11-27 NOTE — Assessment & Plan Note (Signed)
The patient has atrial flutter. We reviewed the tracings from 2008. They are more consistent with flutter and fibrillation. The patient however has very few symptoms with his arrhythmia. As he has had a prior TIA (presumed by Dr. Sandria Manly), there would be no appropriate termination of his oral anticoagulation. Thus the reason to proceed with catheter ablation would be limited to symptoms of which he has few.  I have reviewed this with him. In the event that he has more symptoms, we could certainly proceed with catheter ablation.  We have also reviewed reviewed  Anticoagulation.  We will plan to stop his aspirin. He will begin on sXarelto, . We will stop his digoxin. Will obtain an echo to look at his left ventricular function.  We have reviewed the risks benefits and alternatives to his oral anticoagulation.

## 2010-11-27 NOTE — Patient Instructions (Signed)
Your physician has recommended you make the following change in your medication: Stop Aspirin and Digoxin.  Transition from Coumadin to Xarelto (instructions to be given in Coumadin Clinic today). Your physician has requested that you have an echocardiogram. Echocardiography is a painless test that uses sound waves to create images of your heart. It provides your doctor with information about the size and shape of your heart and how well your heart's chambers and valves are working. This procedure takes approximately one hour. There are no restrictions for this procedure.

## 2010-11-27 NOTE — Assessment & Plan Note (Signed)
As above.

## 2010-11-27 NOTE — Progress Notes (Signed)
HPI: Brendan [name] is a 60 y.o. male Seen at the request of Dr. Myrtis Ser because of atrial flutter.  He has no known structural heart disease. He carries a diagnosis of atrial fibrillation based on her event recorder from 2008 which was reviewed today this is more consistent with atrial flutter. He saw Dr. Myrtis Ser for atrial flutter a couple of weeks ago. Associated symptoms were mild palpitations. There is no exercise intolerance shortness of breath or lightheadedness.  It is his impression that he has had intermittent atrial fibrillation episodes. They do well controlled and his mind by his Cardizem and his digoxin  Thromboembolic risk factors are notable for prior TIA thought likely to be the case by Dr. Sandria Manly  in 2008 as well as coronary artery disease with prior stenting. He denies a history of hypertension  He is under a great deal of psychosocial stress. These are manifesting at work and at home. He is also concerned about progressive memory loss. Current Outpatient Prescriptions  Medication Sig Dispense Refill  . ALPRAZolam (XANAX) 0.5 MG tablet Take 1 tablet by mouth Three times daily as needed.      Marland Kitchen aspirin 81 MG tablet Take 81 mg by mouth daily.        . Calcium Carbonate-Vitamin D (CALCIUM 600+D) 600-400 MG-UNIT per tablet Take 1 tablet by mouth 2 (two) times daily.        . carisoprodol (SOMA) 350 MG tablet Take 1 tablet by mouth Three times daily as needed.      . cholecalciferol (VITAMIN D) 1000 UNITS tablet Take 1,000 Units by mouth daily.        . CRESTOR 40 MG tablet Take 1 tablet by mouth daily.      . digoxin (LANOXIN) 0.25 MG tablet Take 1 tablet (250 mcg total) by mouth daily.  30 tablet  4  . diltiazem (CARDIZEM CD) 180 MG 24 hr capsule Take 1 tablet by mouth daily.      Marland Kitchen etodolac (LODINE) 400 MG tablet Take 1 tablet by mouth Twice daily as needed.      . fenofibrate 160 MG tablet Take 1 tablet by mouth daily.      Marland Kitchen HYDROcodone-acetaminophen (VICODIN) 5-500 MG per tablet  1 every 6 hours as needed for pain- do not exceed 3 tablets per day  90 tablet  4  . metoprolol (TOPROL-XL) 50 MG 24 hr tablet Take 1 tablet by mouth daily.      . Multiple Vitamin (MULTIVITAMIN) tablet Take 1 tablet by mouth daily.        Marland Kitchen omeprazole (PRILOSEC) 20 MG capsule Take 1 tablet by mouth daily.      . vitamin B-12 (CYANOCOBALAMIN) 500 MCG tablet Take 500 mcg by mouth daily.        Marland Kitchen warfarin (COUMADIN) 5 MG tablet Take by mouth as directed.          Allergies  Allergen Reactions  . Tussionex Pennkinetic Er     REACTION: hives    Past Medical History  Diagnosis Date  . Pulmonary nodule   . HTN (hypertension)   . CAD (coronary artery disease)     stent, 2001 / nuclear, June, 2010, no ischemia  . PAF (paroxysmal atrial fibrillation)   . Dyslipidemia   . Obesity   . Diverticulosis of colon   . History of colonic polyps   . BPH (benign prostatic hypertrophy)   . Testosterone deficiency   . ED (erectile dysfunction)   . Lumbar  back pain   . Peripheral neuropathy     lower extremities  . Anxiety and depression   . Vitamin D deficiency   . Blurred vision     episode with confusion, evaluated Dr. Sandria Manly  . Ejection fraction     EF 60%, echo, November, 2008  . B12 deficiency   . Scalp lesion     November, 2011  . Warfarin anticoagulation   . Atrial flutter     New diagnosis November 10, 2010, rate controlled    Past Surgical History  Procedure Date  . Tonsillectomy   . Clavicle surgery   . Arm fracture surgery     Family History  Problem Relation Age of Onset  . Stroke Mother   . Alzheimer's disease Mother   . Heart failure Father   . Stroke Father   . Dementia    . Clotting disorder      History   Social History  . Marital Status: Married    Spouse Name: N/A    Number of Children: N/A  . Years of Education: N/A   Occupational History  . Not on file.   Social History Main Topics  . Smoking status: Current Everyday Smoker -- 0.3 packs/day  .  Smokeless tobacco: Not on file  . Alcohol Use: 2.5 oz/week    5 drink(s) per week  . Drug Use: Not on file  . Sexually Active: Not on file   Other Topics Concern  . Not on file   Social History Narrative  . No narrative on file    Fourteen point review of systems was negative except as noted in HPI and PMH except neuropathy and memory issues  PHYSICAL EXAMINATION  Blood pressure 114/62, pulse 49, height 6\' 6"  (1.981 m), weight 270 lb 12.8 oz (122.834 kg).   Well developed and nourished older Caucasian male appearing his stated agein no acute distress HENT normal Neck supple with JVP-flat Carotids brisk and full without bruits Back without scoliosis or kyphosis Clear Slow but regular rate and rhythm, no murmurs or gallops Abd-soft with active BS without hepatomegaly or midline pulsation Femoral pulses 2+ distal pulses intact No Clubbing cyanosis edema Skin-warm and dry LN-neg submandibular and supraclavicular A & Oriented CN 3-12 normal  Grossly normal sensory and motor function Affect engaging . Sinus rhythm at 49 Intervals 0.22 slides 0.09/0.40 Axis is 16 R-Wave progression

## 2010-12-05 ENCOUNTER — Ambulatory Visit (HOSPITAL_COMMUNITY): Payer: BC Managed Care – PPO | Attending: Cardiology | Admitting: Radiology

## 2010-12-05 DIAGNOSIS — E785 Hyperlipidemia, unspecified: Secondary | ICD-10-CM | POA: Insufficient documentation

## 2010-12-05 DIAGNOSIS — I10 Essential (primary) hypertension: Secondary | ICD-10-CM | POA: Insufficient documentation

## 2010-12-05 DIAGNOSIS — I4892 Unspecified atrial flutter: Secondary | ICD-10-CM

## 2010-12-05 DIAGNOSIS — I059 Rheumatic mitral valve disease, unspecified: Secondary | ICD-10-CM | POA: Insufficient documentation

## 2010-12-05 DIAGNOSIS — I379 Nonrheumatic pulmonary valve disorder, unspecified: Secondary | ICD-10-CM | POA: Insufficient documentation

## 2010-12-05 DIAGNOSIS — I079 Rheumatic tricuspid valve disease, unspecified: Secondary | ICD-10-CM | POA: Insufficient documentation

## 2010-12-08 ENCOUNTER — Telehealth: Payer: Self-pay | Admitting: Pulmonary Disease

## 2010-12-08 ENCOUNTER — Ambulatory Visit: Payer: BC Managed Care – PPO | Admitting: Internal Medicine

## 2010-12-08 MED ORDER — FENOFIBRATE 160 MG PO TABS: 160.0000 mg | ORAL_TABLET | Freq: Every day | ORAL | Status: DC

## 2010-12-08 MED ORDER — OMEPRAZOLE 20 MG PO CPDR: 20.0000 mg | DELAYED_RELEASE_CAPSULE | Freq: Every day | ORAL | Status: DC

## 2010-12-08 MED ORDER — METOPROLOL SUCCINATE ER 50 MG PO TB24: 50.0000 mg | ORAL_TABLET | Freq: Every day | ORAL | Status: DC

## 2010-12-08 MED ORDER — ALPRAZOLAM 0.5 MG PO TABS: 0.5000 mg | ORAL_TABLET | Freq: Three times a day (TID) | ORAL | Status: DC | PRN

## 2010-12-08 NOTE — Telephone Encounter (Signed)
Pt requesting refills for metoprolol and amlodipine. R X sent.

## 2010-12-08 NOTE — Telephone Encounter (Signed)
RX called to [facility]. Pt aware.

## 2011-01-16 ENCOUNTER — Telehealth: Payer: Self-pay | Admitting: Pulmonary Disease

## 2011-01-16 MED ORDER — ALPRAZOLAM 0.5 MG PO TABS: 0.5000 mg | ORAL_TABLET | Freq: Three times a day (TID) | ORAL | Status: DC | PRN

## 2011-01-16 NOTE — Telephone Encounter (Signed)
Addended by: Darrell Jewel on: 01/16/2011 09:06 AM   Modules accepted: Orders

## 2011-01-16 NOTE — Telephone Encounter (Signed)
According to our records we sent xanax #[phone] with 1 refill on 12-08-10, but [facility] read this as only one refill not 2, so refill called in. Pt aware.Carron Curie, CMA

## 2011-01-23 ENCOUNTER — Other Ambulatory Visit: Payer: Self-pay | Admitting: Pulmonary Disease

## 2011-01-23 MED ORDER — DILTIAZEM HCL ER COATED BEADS 180 MG PO CP24: 180.0000 mg | ORAL_CAPSULE | Freq: Every day | ORAL | Status: DC

## 2011-01-23 NOTE — Telephone Encounter (Signed)
received refill request from [facility] for diltiazem 180mg  - pt last seen by SN 05/2010, follow up in 1 year.  Refills sent to [facility] [address]

## 2011-02-05 ENCOUNTER — Telehealth: Payer: Self-pay | Admitting: Pulmonary Disease

## 2011-02-05 MED ORDER — FENOFIBRATE 160 MG PO TABS: 160.0000 mg | ORAL_TABLET | Freq: Every day | ORAL | Status: DC

## 2011-02-05 NOTE — Telephone Encounter (Signed)
Med refilled and spoke with pt and notified that this was done.

## 2011-02-06 ENCOUNTER — Other Ambulatory Visit: Payer: Self-pay | Admitting: *Deleted

## 2011-02-06 MED ORDER — ETODOLAC 400 MG PO TABS: 400.0000 mg | ORAL_TABLET | Freq: Every day | ORAL | Status: DC

## 2011-02-10 ENCOUNTER — Telehealth: Payer: Self-pay | Admitting: Cardiology

## 2011-02-10 NOTE — Telephone Encounter (Signed)
Pt has 3 month fu appt on 9-6 does he need any lab work?

## 2011-02-11 ENCOUNTER — Encounter: Payer: Self-pay | Admitting: Pulmonary Disease

## 2011-02-11 ENCOUNTER — Telehealth: Payer: Self-pay | Admitting: *Deleted

## 2011-02-11 ENCOUNTER — Ambulatory Visit (INDEPENDENT_AMBULATORY_CARE_PROVIDER_SITE_OTHER): Payer: BC Managed Care – PPO | Admitting: Pulmonary Disease

## 2011-02-11 DIAGNOSIS — E538 Deficiency of other specified B group vitamins: Secondary | ICD-10-CM

## 2011-02-11 DIAGNOSIS — I4892 Unspecified atrial flutter: Secondary | ICD-10-CM

## 2011-02-11 DIAGNOSIS — G5790 Unspecified mononeuropathy of unspecified lower limb: Secondary | ICD-10-CM

## 2011-02-11 DIAGNOSIS — K5730 Diverticulosis of large intestine without perforation or abscess without bleeding: Secondary | ICD-10-CM

## 2011-02-11 DIAGNOSIS — F17200 Nicotine dependence, unspecified, uncomplicated: Secondary | ICD-10-CM

## 2011-02-11 DIAGNOSIS — E669 Obesity, unspecified: Secondary | ICD-10-CM

## 2011-02-11 DIAGNOSIS — E291 Testicular hypofunction: Secondary | ICD-10-CM

## 2011-02-11 DIAGNOSIS — E559 Vitamin D deficiency, unspecified: Secondary | ICD-10-CM

## 2011-02-11 DIAGNOSIS — N401 Enlarged prostate with lower urinary tract symptoms: Secondary | ICD-10-CM

## 2011-02-11 DIAGNOSIS — K219 Gastro-esophageal reflux disease without esophagitis: Secondary | ICD-10-CM | POA: Insufficient documentation

## 2011-02-11 DIAGNOSIS — Z23 Encounter for immunization: Secondary | ICD-10-CM

## 2011-02-11 DIAGNOSIS — I10 Essential (primary) hypertension: Secondary | ICD-10-CM

## 2011-02-11 DIAGNOSIS — D126 Benign neoplasm of colon, unspecified: Secondary | ICD-10-CM

## 2011-02-11 DIAGNOSIS — I480 Paroxysmal atrial fibrillation: Secondary | ICD-10-CM

## 2011-02-11 DIAGNOSIS — J984 Other disorders of lung: Secondary | ICD-10-CM

## 2011-02-11 DIAGNOSIS — M545 Low back pain: Secondary | ICD-10-CM

## 2011-02-11 DIAGNOSIS — F341 Dysthymic disorder: Secondary | ICD-10-CM

## 2011-02-11 DIAGNOSIS — I4891 Unspecified atrial fibrillation: Secondary | ICD-10-CM

## 2011-02-11 DIAGNOSIS — E785 Hyperlipidemia, unspecified: Secondary | ICD-10-CM

## 2011-02-11 DIAGNOSIS — I2510 Atherosclerotic heart disease of native coronary artery without angina pectoris: Secondary | ICD-10-CM

## 2011-02-11 MED ORDER — OMEPRAZOLE 20 MG PO CPDR: 20.0000 mg | DELAYED_RELEASE_CAPSULE | Freq: Every day | ORAL | Status: DC

## 2011-02-11 MED ORDER — FENOFIBRATE 160 MG PO TABS: 160.0000 mg | ORAL_TABLET | Freq: Every day | ORAL | Status: DC

## 2011-02-11 MED ORDER — HYDROCODONE-ACETAMINOPHEN 5-500 MG PO TABS: ORAL_TABLET | ORAL | Status: DC

## 2011-02-11 MED ORDER — ROSUVASTATIN CALCIUM 40 MG PO TABS: 40.0000 mg | ORAL_TABLET | Freq: Every day | ORAL | Status: DC

## 2011-02-11 MED ORDER — ALPRAZOLAM 0.5 MG PO TABS: 0.5000 mg | ORAL_TABLET | Freq: Three times a day (TID) | ORAL | Status: DC | PRN

## 2011-02-11 MED ORDER — ETODOLAC 400 MG PO TABS: 400.0000 mg | ORAL_TABLET | Freq: Two times a day (BID) | ORAL | Status: DC

## 2011-02-11 MED ORDER — CARISOPRODOL 350 MG PO TABS: 350.0000 mg | ORAL_TABLET | Freq: Three times a day (TID) | ORAL | Status: DC | PRN

## 2011-02-11 NOTE — Progress Notes (Signed)
Subjective:    Patient ID: Brendan [name], male    DOB: November 22, 1950, 60 y.o.   MRN: [id number]  HPI 60 y/o WM here for a follow up visit... he has mult med problems including continued smoking & underlying COPD;  HBP;  CAD & PAF followed by [name] on Coumadin via the CC;  Hyperlipidemia & Obesity;  LBP & Neuropathy w/ eval from [name];  Anxiety & some depression...  ~  November 18, 2009:  Yearly ROV & doing satis he says... notes some sinus congestion, drainage, no discoloration, blood, fever, HA, etc... we discussed Rx w/ Mucinex, Saline, etc... he saw [facility] 6/11 & he was in [place] on EKG- remains on Coumadin etc w/ PAF... BP controlled on meds, but unfotunately he hasn't quit smoking, although he has lost 27# on diet... his Simva80 was switched to Crestor20 & due for f/u labs (see below).  ~  May 19, 2010:  36mo ROV- c/o arthritis "all over" and low energy> he's under alot of stress-work, home life etc, feels depressed but doesn't want additional meds... states that Testosterone level was low in the past & he's been followed by [name]/ [name] for Urology... we discussed re-checking Testos level (420) & Rx accordingly (supportive care)... he's still smoking ~1/4ppd, denies CP, palpit, ch in DOE;  saw [facility] 05/09/10- stable no changes made, labs done (incr TG, low HDL) but already on Cres20 & Fenofib160- needs better diet, continue wt reduction... also saw [facility] 10/11 w/ pt concern re: memory, but he has gait abn, Hx TIA, ?CMT Neuropathy, & Tremor ([name] wanted to do EMG/NCV & sleep study but pt declined)... no change in meds... given Pneumonia vaccine & TDAP today.  ~  February 11, 2011:  54mo ROV & he is here for med refills, not fasting today & he will ret for Fasting blood work soon... He tells me he is going to retire soon...    >Still smoking ~1/2ppd w/ mild cough, AM sputum c/w chr bronchitis; he'll give the Chantix a try & advised to call quit line as well; known sm granulomas on prev  CT, not seen on CXRs...    >He is followed for CARDS by [name] & [name]> BP controlled on MetoprololER50 & Diltiazem180 w/ BP today 106/72; known CAD w/ prev PTCA & stent> denies angina, ch in SOB, edema; known PAFib/ Flutter w/ palpit on XERALTO & off Dig & Coumadin> notes from [facility] & [name] reviewed...    >He has Hyperlipidemia on Crestor incr to 40 by Cards & Fenofib160> f/u FLp is pending...    >He is overweight at 272# but he is actually down 12# this yr; encouraged to keep up the good work...    >Mult other problems as outlined below >>          Problem List:  CIGARETTE SMOKER (ICD-305.1) - still smokes ~1/2 ppd... we have prev discussed smoking cessation many times but he is not motivated to quit... "I'm working on it" he says... ~  9/12:  He agreed to try Chantix & Rx written along w/ rec to call [phone]- QUIT NOW help line...  PULMONARY NODULE (ICD-518.89) - CXR's have been normal... CTChest 12/02 w/ stable small nodules that are likely granulomas...  ~  CXR 9/09 showed stable- clear lungs and no nodules seen on plain film. ~  CXR 6/11 showed COPD/ emphysema, no nodules, NAD...  HYPERTENSION (ICD-401.9) - controlled w/ TOPROL XL 50mg /d, & DILTIAZEM 180mg /d... BP= 106/72 and doing well.. denies HA, fatigue, visual  changes, CP, dizziness, syncope, dyspnea, edema, etc...  CAD (ICD-414.00) - followed by [name] on ASA 81mg  daily but off now & on XERALTO per [name].. ~  cath 2001 w/ 3 vessel CAD- s/p PTCA/stent... ~  had NuclearStressTest 1/08= normal without ischemia & EF=60%... ~  He denies CP, change in SOB, edema, etc... exerc= yard, some walking prior to his back pain prob.  CARDIAC ARRHYTHMIA >> Hx SVT, PAF, AFlutter >> [name] indicated that his [place] score wasn't high enough for Coumadin therapy prev, but it was started 3/10 w/ incr PAF & ?TIA... ~  6/12:  He developed AFlutter & Cards decided to treat w/ XERALTO... Notes from [facility] & [name] are  reviewed...  HYPERLIPIDEMIA (ICD-272.4) - now on CRESTOR 40mg /d, FENOFIBRATE 160mg /d, + "diet" therapy... prev on Zetia but stopped by Cards & Simva80 stopped w/ new FDA guidelines. ~  FLP 11/08 showed TChol 188, TG 170, HDL 31, LDL 123... ~  FLP 9/09 showed TChol 224, TG 176, HDL 28, LDL 143... rec- incr Simvast 80/d & better diet... ~  FLP 12/09 showed TChol 186, TG 161, HDL 31, LDL 123... rec contin Simva80 + [id number] & get wt down. ~  FLP 6/10 showed TChol 185, TG 253, HDL 32, LDL 118... rec> needs better diet + exercise, refer to [facility]. ~  FLP at work 2/11 (on Simva80+Feno160) showed TChol 184, TG 304, HDL 33, LDL 90 ~  5/11: changed from Simva80 to CRESTOR 20mg /d... ~  FLP 6/11 on Cres20+Feno160 showed TChol 168, TG 185, HDL 32, LDL 99 ~  FLP 12/11 on Cres20+Feno160 showed TChol 184, TG 242, HDL 30, LDL 108... needs better diet, gt wt down. ~  Cardiology increased the Crestor to 40mg  in the interim... ~  FLP 9/12 on Cres40+Feno160 ==> pending  OBESITY (ICD-278.00) - we discussed diet and exercise program in detail... ~  9/09:    his weight is down 12# today to 322#.Marland Kitchen.  ~  weight 12/09 = 319# ~  weight 3/10 = 315# ~  weight 6/10 = 309# ~  weight 6/11 = 282#... keep up the great work... ~  weight 12/11 = 284... what happened? ~  Weight 9/12 = 272#... Continue the wt reduction...  GERD - on OMEPRAZOLE 20mg /d... DIVERTICULOSIS OF COLON (ICD-562.10), & COLONIC POLYPS (ICD-211.3) - last colonoscopy was 2/06 by DrStark showing divertics, and several 2-60mm polyps (hyperplastic)... f/u planned 2014...  BENIGN PROSTATIC HYPERTROPHY, WITH OBSTRUCTION (ICD-600.01),  Hx of TESTOSTERONE DEFICIENCY (ICD-257.2),  & ERECTILE DYSFUNCTION (ICD-302.72) - followed by Urology- [name] & [name]... ~  labs 12/11 showed Testosterone level = 420 (350-890)  BACK PAIN, LUMBAR (ICD-724.2) - Ortho eval by DrGraves w/ PT recommended & improved... he uses ETODOLAC 400mg Bid Prn, SOMA 350mg Tid Prn, &  VICODIN Prn... ~  LumbarXRay 3/10 showed mild scoliosis, DDD w/ osteophytes & facet degen changes...   PERIPHERAL NEUROPATHY, LOWER EXTREMITY (ICD-355.8) - eval 3/10 by Autumn Patty w/ prob peripheral neuropathy found ?CMT ("I'm clumsey, I fall")... labs 3/10 showed borderline B 12 level = 273 by [name] & Rx'd w/ Vit B 12 562mcg/d by mouth. ~  labs here 6/10 showed B 12 level = 584 ~  f/u Neuro eval 10/11 by [name] for Gait abn, Hx TIA, ?CMT neuropathy, LE weakness, Tremor, FamHx Alz dis> wanted to do EMG/NCV & sleep study but pt declined.  ANXIETY DEPRESSION (ICD-300.4) - on ALPRAZOLAM 0.5mg  Tid Prn... under alot of stress> work, home life, & wife w/ some variant of musc dystrophy...  12/09 w/  confusional episode, couldn't make decision, eval [facility] w/ neg CT Brain & neg exam, subseq MRI w/ min atrophy/ sm vessel dis, NAD; Rx ASA 81mg /d.  ? of B12 DEFICIENCY (ICD-266.2) & VITAMIN D DEFICIENCY (ICD-268.9) - as noted Vit B12 was borderline per [name] 3/10 (273) and he was started on oral B12 supplement ~571mcg/d... repeat B12 level 6/10 = 584, improved... Vit D level = 18 6/10 & pt rec to start Vit D supplement w/ repeat Vit D level = 27 in Mar11... OK to continue 1000 u daily... ~  labs 6/11:  Vit B12= 606;  Vit D= 35... continue supplements.   Past Surgical History  Procedure Date  . Tonsillectomy   . Clavicle surgery   . Arm fracture surgery     Outpatient Encounter Prescriptions as of 02/11/2011  Medication Sig Dispense Refill  . ALPRAZolam (XANAX) 0.5 MG tablet Take 1 tablet (0.5 mg total) by mouth 3 (three) times daily as needed for sleep.  90 tablet  0  . Calcium Carbonate-Vitamin D (CALCIUM 600+D) 600-400 MG-UNIT per tablet Take 1 tablet by mouth 2 (two) times daily.        . carisoprodol (SOMA) 350 MG tablet Take 1 tablet by mouth Three times daily as needed.      . cholecalciferol (VITAMIN D) 1000 UNITS tablet Take 1,000 Units by mouth daily.        . CRESTOR 40 MG tablet Take 1 tablet by  mouth daily.      Marland Kitchen diltiazem (CARDIZEM CD) 180 MG 24 hr capsule Take 1 capsule (180 mg total) by mouth daily.  30 capsule  6  . etodolac (LODINE) 400 MG tablet Take 400 mg by mouth 2 (two) times daily.        . fenofibrate 160 MG tablet Take 1 tablet (160 mg total) by mouth daily.  30 tablet  5  . HYDROcodone-acetaminophen (VICODIN) 5-500 MG per tablet 1 every 6 hours as needed for pain- do not exceed 3 tablets per day  90 tablet  4  . metoprolol (TOPROL-XL) 50 MG 24 hr tablet Take 1 tablet (50 mg total) by mouth daily.  30 tablet  3  . Multiple Vitamin (MULTIVITAMIN) tablet Take 1 tablet by mouth daily.        Marland Kitchen omeprazole (PRILOSEC) 20 MG capsule Take 1 capsule (20 mg total) by mouth daily.  30 capsule  3  . Rivaroxaban (XARELTO) 20 MG TABS Take 1 tablet by mouth daily.  30 tablet  6  . vitamin B-12 (CYANOCOBALAMIN) 500 MCG tablet Take 500 mcg by mouth daily.        Marland Kitchen DISCONTD: etodolac (LODINE) 400 MG tablet Take 1 tablet (400 mg total) by mouth daily.  60 tablet  3    Allergies  Allergen Reactions  . Tussionex Pennkinetic Er     REACTION: hives    Current Medications, Allergies, Past Medical History, Past Surgical History, Family History, and Social History were reviewed in [employer] record.    Review of Systems        See HPI - all other systems neg except as noted...  The patient complains of dyspnea on exertion, muscle weakness, and difficulty walking.  The patient denies anorexia, fever, weight loss, weight gain, vision loss, decreased hearing, hoarseness, chest pain, syncope, peripheral edema, prolonged cough, headaches, hemoptysis, abdominal pain, melena, hematochezia, severe indigestion/heartburn, hematuria, incontinence, suspicious skin lesions, transient blindness, depression, unusual weight change, abnormal bleeding, enlarged lymph nodes, and angioedema.  Objective:   Physical Exam    WD, Obese, 60 y/o WM in NAD... he is 6'6" tall and weighs  272lbs GENERAL:  Alert & oriented; pleasant & cooperative... HEENT:  [place]/AT, EOM-wnl, PERRLA, EACs-clear, TMs-wnl, NOSE-clear, THROAT-clear & wnl. NECK:  Supple w/ fairROM; no JVD; normal carotid impulses w/o bruits; no thyromegaly or nodules palpated; no lymphadenopathy. CHEST:  Clear to P & A; without wheezes/ rales/ or rhonchi. HEART:  Regular Rhythm; gr 1/6 SEM without rubs or gallops heard... ABDOMEN:  Soft & nontender; normal bowel sounds; no organomegaly or masses detected. EXT: without deformities, mod arthritic changes; no varicose veins/ +venous insuffic/ tr edema.  NEURO:  CN's intact;  no focal neuro deficits x sl decr sensation in LE's & wide based gait... DERM:  No lesions noted; no rash etc...   Assessment & Plan:   COPD/ Pulm Nodules on CT/ Cig Smoker>  Must quit all smoking, Chantix written;  CXR/ CT reviewed likely granulomas, NAD...  HBP>  Controlled on BBlocker, CCB;  Continue same meds + diet & exercise...  CAD>  Followed by [name] & stable; last Myoview 2008 was neg; he is active yard etc...  Arrhythmias>  Followed by [name] w/ hx SVT, PAF, now AFlutter; he was switched from Coumadin to [facility]...  Hyperlipidemia>  Increased to Cr4es40 along w/ his diet & Fenofibrate;  F/u FLP is pending...  Obesity>  Nice job w/ wt reduction to 272# needs to keep up the good work...  GI> GERD, Divertics, Polyps>  Followed by DrStark & repeat colon due 2014...  GU> BPH, Low-T, ED>  Followed by [name]...  DJD, LBP>  On Etodolac, Soma, Vicodin for his chronic pain...  NEUROPATHY>  Followed by Autumn Patty w/ gait abn, TIA, ?CMT Neuropathy...  Hx Borderline B12 & low Vit D level>  He takes oral supplements...  Anxiety/ Depression>  On Alprazolam prn & he is hoping that retirement will help.Marland KitchenMarland Kitchen

## 2011-02-11 NOTE — Telephone Encounter (Signed)
Will send refills of this med during ov today with SN.

## 2011-02-11 NOTE — Patient Instructions (Signed)
Today we updated your meds in EPIC...    We refilled the meds you requested w/ written RXs to hand to the pharmacist so you can be assured that they got them!  Let's plan to do your follow up fasting blood work tomorrow at the Cardiology office...    Please call the PHONE TREE in a few days for your results...    Dial [id number] & when prompted enter your patient number followed by the # symbol...    Your patient number is:  [id number]#  Keep up the great job w/ weight reduction!!!  Call for any problems.Marland KitchenMarland Kitchen

## 2011-02-12 ENCOUNTER — Ambulatory Visit (INDEPENDENT_AMBULATORY_CARE_PROVIDER_SITE_OTHER): Payer: BC Managed Care – PPO | Admitting: Cardiology

## 2011-02-12 ENCOUNTER — Encounter: Payer: Self-pay | Admitting: Cardiology

## 2011-02-12 ENCOUNTER — Other Ambulatory Visit (INDEPENDENT_AMBULATORY_CARE_PROVIDER_SITE_OTHER): Payer: BC Managed Care – PPO | Admitting: *Deleted

## 2011-02-12 DIAGNOSIS — D126 Benign neoplasm of colon, unspecified: Secondary | ICD-10-CM

## 2011-02-12 DIAGNOSIS — E291 Testicular hypofunction: Secondary | ICD-10-CM

## 2011-02-12 DIAGNOSIS — E559 Vitamin D deficiency, unspecified: Secondary | ICD-10-CM

## 2011-02-12 DIAGNOSIS — E785 Hyperlipidemia, unspecified: Secondary | ICD-10-CM

## 2011-02-12 DIAGNOSIS — F341 Dysthymic disorder: Secondary | ICD-10-CM

## 2011-02-12 DIAGNOSIS — E538 Deficiency of other specified B group vitamins: Secondary | ICD-10-CM

## 2011-02-12 DIAGNOSIS — I2510 Atherosclerotic heart disease of native coronary artery without angina pectoris: Secondary | ICD-10-CM

## 2011-02-12 DIAGNOSIS — I4892 Unspecified atrial flutter: Secondary | ICD-10-CM

## 2011-02-12 DIAGNOSIS — I10 Essential (primary) hypertension: Secondary | ICD-10-CM

## 2011-02-12 LAB — BASIC METABOLIC PANEL
Calcium: 9.3 mg/dL (ref 8.4–10.5)
GFR: 80.48 mL/min (ref >60.00)
Sodium: 140 mEq/L (ref 135–145)

## 2011-02-12 LAB — CBC WITH DIFFERENTIAL/PLATELET
Basophils Absolute: 0 10*3/uL (ref 0.0–0.1)
Eosinophils Absolute: 0.2 10*3/uL (ref 0.0–0.7)
HCT: 45.7 % (ref 39.0–52.0)
Hemoglobin: 15.6 g/dL (ref 13.0–17.0)
Lymphs Abs: 2 10*3/uL (ref 0.7–4.0)
MCHC: 34.2 g/dL (ref 30.0–36.0)
MCV: 93.6 fl (ref 78.0–100.0)
Neutro Abs: 3.3 10*3/uL (ref 1.4–7.7)
RDW: 12.6 % (ref 11.5–14.6)

## 2011-02-12 LAB — HEPATIC FUNCTION PANEL
Alkaline Phosphatase: 45 U/L (ref 39–117)
Bilirubin, Direct: 0.2 mg/dL (ref 0.0–0.3)

## 2011-02-12 MED ORDER — ATORVASTATIN CALCIUM 80 MG PO TABS: 80.0000 mg | ORAL_TABLET | Freq: Every day | ORAL | Status: DC

## 2011-02-12 NOTE — Patient Instructions (Signed)
Your physician wants you to follow-up in: 12 months.   You will receive a reminder letter in the mail two months in advance. If you don't receive a letter, please call our office to schedule the follow-up appointment.  Your physician has recommended you make the following change in your medication: Start Lipitor 80mg  daily and stop your crestor

## 2011-02-12 NOTE — Progress Notes (Signed)
HPI Patient is seen for followup of coronary disease.  I had seen him on November 10, 2010.  At that time I decided to refer him for further evaluation of his atrial arrhythmia.  He was seen by Dr. Graciela Husbands on November 27, 2010.  He felt that the rhythm may have been atrial flutter all along.  Patient is not symptomatic.  It was felt that there was no reason to push for flutter ablation at this point as he was not symptomatic and he would require anticoagulation regardless.  Patient is doing well. Allergies  Allergen Reactions  . Tussionex Pennkinetic Er     REACTION: hives    Current Outpatient Prescriptions  Medication Sig Dispense Refill  . ALPRAZolam (XANAX) 0.5 MG tablet Take 1 tablet (0.5 mg total) by mouth 3 (three) times daily as needed for sleep.  90 tablet  5  . Calcium Carbonate-Vitamin D (CALCIUM 600+D) 600-400 MG-UNIT per tablet Take 1 tablet by mouth 2 (two) times daily.        . carisoprodol (SOMA) 350 MG tablet Take 1 tablet (350 mg total) by mouth 3 (three) times daily as needed for muscle spasms.  90 tablet  5  . cholecalciferol (VITAMIN D) 1000 UNITS tablet Take 1,000 Units by mouth daily.        Marland Kitchen diltiazem (CARDIZEM CD) 180 MG 24 hr capsule Take 1 capsule (180 mg total) by mouth daily.  30 capsule  6  . etodolac (LODINE) 400 MG tablet Take 1 tablet (400 mg total) by mouth 2 (two) times daily.  60 tablet  11  . fenofibrate 160 MG tablet Take 1 tablet (160 mg total) by mouth daily.  30 tablet  11  . HYDROcodone-acetaminophen (VICODIN) 5-500 MG per tablet 1 every 6 hours as needed for pain- do not exceed 3 tablets per day  90 tablet  5  . metoprolol (TOPROL-XL) 50 MG 24 hr tablet Take 1 tablet (50 mg total) by mouth daily.  30 tablet  3  . Multiple Vitamin (MULTIVITAMIN) tablet Take 1 tablet by mouth daily.        Marland Kitchen omeprazole (PRILOSEC) 20 MG capsule Take 1 capsule (20 mg total) by mouth daily.  30 capsule  11  . Rivaroxaban (XARELTO) 20 MG TABS Take 1 tablet by mouth daily.  30 tablet  6   . rosuvastatin (CRESTOR) 40 MG tablet Take 1 tablet (40 mg total) by mouth daily.  30 tablet  11  . vitamin B-12 (CYANOCOBALAMIN) 500 MCG tablet Take 500 mcg by mouth daily.          History   Social History  . Marital Status: Married    Spouse Name: N/A    Number of Children: N/A  . Years of Education: N/A   Occupational History  . Not on file.   Social History Main Topics  . Smoking status: Current Everyday Smoker -- 0.3 packs/day  . Smokeless tobacco: Not on file   Comment: smokes about 10 daily  . Alcohol Use: 2.5 oz/week    5 drink(s) per week  . Drug Use: Not on file  . Sexually Active: Not on file   Other Topics Concern  . Not on file   Social History Narrative  . No narrative on file    Family History  Problem Relation Age of Onset  . Stroke Mother   . Alzheimer's disease Mother   . Heart failure Father   . Stroke Father   . Dementia    .  Clotting disorder      Past Medical History  Diagnosis Date  . Pulmonary nodule   . HTN (hypertension)   . CAD (coronary artery disease)     stent, 2001 / nuclear, June, 2010, no ischemia  . PAF (paroxysmal atrial fibrillation)   . Dyslipidemia   . Obesity   . Diverticulosis of colon   . History of colonic polyps   . BPH (benign prostatic hypertrophy)   . Testosterone deficiency   . ED (erectile dysfunction)   . Lumbar back pain   . Peripheral neuropathy     lower extremities  . Anxiety and depression   . Vitamin D deficiency   . Blurred vision     episode with confusion, evaluated Dr. Sandria Manly  . Ejection fraction     EF 60%, echo, November, 2008  . B12 deficiency   . Scalp lesion     November, 2011  . Warfarin anticoagulation   . Atrial flutter     New diagnosis November 10, 2010, rate controlled    Past Surgical History  Procedure Date  . Tonsillectomy   . Clavicle surgery   . Arm fracture surgery     ROS  Patient denies fever, chills, headache, sweats, rash, change in vision, change in hearing,  chest pain, cough, nausea vomiting, urinary symptoms.  All other systems are reviewed and are negative.  PHYSICAL EXAM Patient is stable today.  His weight is stable.  He is oriented to person time and place.  Affect is normal.  Head is atraumatic.  There is no jugular venous distention.  Lungs are clear.  Respiratory effort is nonlabored.  Cardiac exam reveals S1-S2.  There are no clicks or significant murmurs.  Abdomen is soft.  There is no peripheral edema. Filed Vitals:   02/12/11 1122  BP: 110/68  Pulse: 60  Height: 6\' 4"  (1.93 m)  Weight: 268 lb (121.564 kg)    EKG is not done today.  ASSESSMENT & PLAN

## 2011-02-12 NOTE — Assessment & Plan Note (Signed)
Coronary disease is stable. No change in therapy. 

## 2011-02-12 NOTE — Assessment & Plan Note (Signed)
The patient is on Crestor.  At this time would probably be helpful to changing to Lipitor generic

## 2011-02-12 NOTE — Assessment & Plan Note (Signed)
Patient's atrial flutter is stable.  We will plan a rate control and to follow him clinically.

## 2011-02-15 ENCOUNTER — Encounter: Payer: Self-pay | Admitting: Pulmonary Disease

## 2011-03-09 ENCOUNTER — Telehealth: Payer: Self-pay | Admitting: Pulmonary Disease

## 2011-03-09 ENCOUNTER — Telehealth: Payer: Self-pay | Admitting: Cardiology

## 2011-03-09 NOTE — Telephone Encounter (Signed)
Pt wants samples fenofibrate no pills left and any of his other meds. He has no insurance until cobra takes affect.

## 2011-03-09 NOTE — Telephone Encounter (Signed)
All of the listed medications are generic and we so not have samples. LMOMTCB x 1

## 2011-03-09 NOTE — Telephone Encounter (Signed)
Xarelto 20 mg Lot #[id number] exp 12-14 #25  Samples placed at front desk.

## 2011-03-09 NOTE — Telephone Encounter (Signed)
TriCor 145 mg Exp 12/24/11 lot [id number]  #28

## 2011-03-10 NOTE — Telephone Encounter (Signed)
Called, spoke with pt. He is requesting samples of meds because he is having problems with his insurance and his medications are going to be expensive right now.  However, the only sample we have available is Lipitor 40 mg but pt takes 80 mg.  He is requesting the 40 mg tablets and will take two of these to equal his 80 mg strength.  4 samples placed at front.  Pt is aware no other samples available at this time.  I asked him if he needed to see if some of these could be changed to something cheaper but he states he is hoping the insurance problem will be fixed soon but if not he will call back for this.  Lipitor samples place at front - pt awaren  Lipitor 40 mg Lot # [id number] Exp Date 03/2012

## 2011-03-13 LAB — BASIC METABOLIC PANEL
BUN: 19 mg/dL (ref 6–23)
CO2: 27 mEq/L (ref 19–32)
Calcium: 9.8 mg/dL (ref 8.4–10.5)
Creatinine, Ser: 1 mg/dL (ref 0.4–1.5)
Glucose, Bld: 108 mg/dL — ABNORMAL HIGH (ref 70–99)
Sodium: 142 mEq/L (ref 135–145)

## 2011-03-13 LAB — URINE CULTURE

## 2011-03-13 LAB — URINALYSIS, ROUTINE W REFLEX MICROSCOPIC
Nitrite: NEGATIVE
Specific Gravity, Urine: 1.016 (ref 1.005–1.030)
Urobilinogen, UA: 1 mg/dL (ref 0.0–1.0)

## 2011-03-13 LAB — DIFFERENTIAL
Basophils Absolute: 0 10*3/uL (ref 0.0–0.1)
Basophils Relative: 1 % (ref 0–1)
Eosinophils Relative: 4 % (ref 0–5)
Monocytes Absolute: 0.4 10*3/uL (ref 0.1–1.0)
Neutro Abs: 3.5 10*3/uL (ref 1.7–7.7)

## 2011-03-13 LAB — POCT CARDIAC MARKERS: Troponin i, poc: <0.05 ng/mL (ref 0.00–0.09)

## 2011-03-13 LAB — CBC
Hemoglobin: 16.3 g/dL (ref 13.0–17.0)
MCHC: 33.8 g/dL (ref 30.0–36.0)
RDW: 12 % (ref 11.5–15.5)

## 2011-03-13 LAB — POCT TOXICOLOGY PANEL: Benzodiazepines: POSITIVE

## 2011-03-26 ENCOUNTER — Other Ambulatory Visit: Payer: Self-pay | Admitting: Pulmonary Disease

## 2011-03-26 MED ORDER — METOPROLOL SUCCINATE ER 50 MG PO TB24: 50.0000 mg | ORAL_TABLET | Freq: Every day | ORAL | Status: DC

## 2011-06-21 ENCOUNTER — Other Ambulatory Visit: Payer: Self-pay | Admitting: Internal Medicine

## 2011-06-23 ENCOUNTER — Ambulatory Visit: Payer: Self-pay | Admitting: Pharmacist

## 2011-06-23 DIAGNOSIS — I4892 Unspecified atrial flutter: Secondary | ICD-10-CM

## 2011-06-23 DIAGNOSIS — I480 Paroxysmal atrial fibrillation: Secondary | ICD-10-CM

## 2011-06-23 DIAGNOSIS — Z7901 Long term (current) use of anticoagulants: Secondary | ICD-10-CM

## 2011-08-18 ENCOUNTER — Telehealth: Payer: Self-pay | Admitting: Pulmonary Disease

## 2011-08-18 NOTE — Telephone Encounter (Signed)
Reviewed pt's chart.  Pt last saw SN on 02/11/11 and was told to f/u in 6 months. ( no pending appts with SN) Pt requesting refill on Soma, Vicodin and Xanax.  All meds were filled on 02/11/11 for # 90 x 5 refills.  Please advise if ok to refill or not as pt is completely out.  Thanks!  Allergies  Allergen Reactions  . Tussionex Pennkinetic Er     REACTION: hives

## 2011-08-19 MED ORDER — CARISOPRODOL 350 MG PO TABS: 350.0000 mg | ORAL_TABLET | Freq: Three times a day (TID) | ORAL | Status: DC | PRN

## 2011-08-19 MED ORDER — ALPRAZOLAM 0.5 MG PO TABS: 0.5000 mg | ORAL_TABLET | Freq: Three times a day (TID) | ORAL | Status: DC | PRN

## 2011-08-19 MED ORDER — HYDROCODONE-ACETAMINOPHEN 5-500 MG PO TABS: ORAL_TABLET | ORAL | Status: DC

## 2011-08-19 NOTE — Telephone Encounter (Signed)
Pt is wondering what the hold up is on getting his meds refilled & wants a returned phone call ASAP  [phone].  Pt stated he is "totally out of these pills."   Antionette Fairy

## 2011-08-19 NOTE — Telephone Encounter (Signed)
Pt states he talked with SN about waiting on the follow-up and it was okay to see him back in 1 year if he was having no problems. Pt states he is having no problems and needs refills on these medications today because he will be leaving to go out of town tomorrow.   Per Leigh, we can only fill these medications for a 1 month supply and the pt needs to schedule a follow-up with SN now in order to received future refills on these medications.   Pt aware of the above and has scheduled his FU with SN on 09/29/11 @ 11am and verbalized understanding that he needs to keep this appt for further refills.

## 2011-08-20 ENCOUNTER — Other Ambulatory Visit: Payer: Self-pay | Admitting: *Deleted

## 2011-08-20 MED ORDER — DILTIAZEM HCL ER COATED BEADS 180 MG PO CP24: 180.0000 mg | ORAL_CAPSULE | Freq: Every day | ORAL | Status: DC

## 2011-08-20 MED ORDER — FENOFIBRATE 160 MG PO TABS: 160.0000 mg | ORAL_TABLET | Freq: Every day | ORAL | Status: DC

## 2011-08-25 ENCOUNTER — Other Ambulatory Visit: Payer: Self-pay | Admitting: Internal Medicine

## 2011-09-18 ENCOUNTER — Other Ambulatory Visit: Payer: Self-pay | Admitting: *Deleted

## 2011-09-18 MED ORDER — ALPRAZOLAM 0.5 MG PO TABS: 0.5000 mg | ORAL_TABLET | Freq: Three times a day (TID) | ORAL | Status: DC | PRN

## 2011-09-29 ENCOUNTER — Ambulatory Visit (INDEPENDENT_AMBULATORY_CARE_PROVIDER_SITE_OTHER): Payer: BC Managed Care – PPO | Admitting: Pulmonary Disease

## 2011-09-29 ENCOUNTER — Ambulatory Visit (INDEPENDENT_AMBULATORY_CARE_PROVIDER_SITE_OTHER)
Admission: RE | Admit: 2011-09-29 | Discharge: 2011-09-29 | Disposition: A | Discharge status: Home / Self Care | Payer: BC Managed Care – PPO | Source: Ambulatory Visit | Attending: Pulmonary Disease | Admitting: Pulmonary Disease

## 2011-09-29 ENCOUNTER — Encounter: Payer: Self-pay | Admitting: Pulmonary Disease

## 2011-09-29 VITALS — BP 108/72 | HR 63 | Temp 96.9°F | Ht 78.0 in | Wt 281.2 lb

## 2011-09-29 DIAGNOSIS — J984 Other disorders of lung: Secondary | ICD-10-CM

## 2011-09-29 DIAGNOSIS — E785 Hyperlipidemia, unspecified: Secondary | ICD-10-CM

## 2011-09-29 DIAGNOSIS — G5790 Unspecified mononeuropathy of unspecified lower limb: Secondary | ICD-10-CM

## 2011-09-29 DIAGNOSIS — I2510 Atherosclerotic heart disease of native coronary artery without angina pectoris: Secondary | ICD-10-CM

## 2011-09-29 DIAGNOSIS — I480 Paroxysmal atrial fibrillation: Secondary | ICD-10-CM

## 2011-09-29 DIAGNOSIS — K5730 Diverticulosis of large intestine without perforation or abscess without bleeding: Secondary | ICD-10-CM

## 2011-09-29 DIAGNOSIS — K219 Gastro-esophageal reflux disease without esophagitis: Secondary | ICD-10-CM

## 2011-09-29 DIAGNOSIS — E669 Obesity, unspecified: Secondary | ICD-10-CM

## 2011-09-29 DIAGNOSIS — I10 Essential (primary) hypertension: Secondary | ICD-10-CM

## 2011-09-29 DIAGNOSIS — F341 Dysthymic disorder: Secondary | ICD-10-CM

## 2011-09-29 DIAGNOSIS — I4891 Unspecified atrial fibrillation: Secondary | ICD-10-CM

## 2011-09-29 DIAGNOSIS — F17200 Nicotine dependence, unspecified, uncomplicated: Secondary | ICD-10-CM

## 2011-09-29 DIAGNOSIS — N401 Enlarged prostate with lower urinary tract symptoms: Secondary | ICD-10-CM

## 2011-09-29 DIAGNOSIS — E291 Testicular hypofunction: Secondary | ICD-10-CM

## 2011-09-29 DIAGNOSIS — D126 Benign neoplasm of colon, unspecified: Secondary | ICD-10-CM

## 2011-09-29 MED ORDER — CARISOPRODOL 350 MG PO TABS: 350.0000 mg | ORAL_TABLET | Freq: Three times a day (TID) | ORAL | Status: DC | PRN

## 2011-09-29 MED ORDER — ALPRAZOLAM 0.5 MG PO TABS: 0.5000 mg | ORAL_TABLET | Freq: Three times a day (TID) | ORAL | Status: DC | PRN

## 2011-09-29 MED ORDER — HYDROCODONE-ACETAMINOPHEN 5-500 MG PO TABS: ORAL_TABLET | ORAL | Status: DC

## 2011-09-29 NOTE — Patient Instructions (Addendum)
Today we updated your med list in our EPIC system...    Continue your current medications the same...  Today we did your follow up CXR... Please return to our lab one morning this week for your FASTING blood work...    We will call you w/ the reports when avail...  Let's get back on track w/ our diet & exercise program...  Call for any questions...  Let's plan a brief follow up appt in 6 months.Marland KitchenMarland Kitchen

## 2011-09-29 NOTE — Progress Notes (Addendum)
Subjective:    Patient ID: Brendan [name], male    DOB: 1951/04/18, 61 y.o.   MRN: [id number]  HPI 61 y/o WM here for a follow up visit... he has mult med problems including continued smoking & underlying COPD;  HBP;  CAD & PAF followed by [name] on Coumadin via the CC;  Hyperlipidemia & Obesity;  LBP & Neuropathy w/ eval from [name];  Anxiety & some depression...  ~  November 18, 2009:  Yearly ROV & doing satis he says... notes some sinus congestion, drainage, no discoloration, blood, fever, HA, etc... we discussed Rx w/ Mucinex, Saline, etc... he saw [facility] 6/11 & he was in [place]us[place] on EKG- remains on Coumadin etc w/ PAF... BP controlled on meds, but unfotunately he hasn't quit smoking, although he has lost 27# on diet... his Simva80 was switched to Crestor20 & due for f/u labs (see below).  ~  May 19, 2010:  526mo ROV- c/o arthritis "all over" and low energy> he's under alot of stress-work, home life etc, feels depressed but doesn't want additional meds... states that Testosterone level was low in the past & he's been followed by [name]/ [name] for Urology... we discussed re-checking Testos level (420) & Rx accordingly (supportive care)... he's still smoking ~1/4ppd, denies CP, palpit, ch in DOE;  saw [name] 05/09/10- stable no changes made, labs done (incr TG, low HDL) but already on Cres20 & Fenofib160- needs better diet, continue wt reduction... also saw [name] 10/11 w/ pt concern re: memory, but he has gait abn, Hx TIA, ?CMT Neuropathy, & Tremor ([name] wanted to do EMG/NCV & sleep study but pt declined)... no change in meds... given Pneumonia vaccine & TDAP today.  ~  February 11, 2011:  26mo ROV & he is here for med refills, not fasting today & he will ret for Fasting blood work soon... He tells me he is going to retire soon...    >Still smoking ~1/2ppd w/ mild cough, AM sputum c/w chr bronchitis; he'll give the Chantix a try & advised to call quit line as well; known sm granulomas on prev  CT, not seen on CXRs...    >He is followed for CARDS by [name] & [name]> BP controlled on MetoprololER50 & Diltiazem180 w/ BP today 106/72; known CAD w/ prev PTCA & stent> denies angina, ch in SOB, edema; known PAFib/ Flutter w/ palpit on XERALTO & off Dig & Coumadin> notes from [name] & [name] reviewed...    >He has Hyperlipidemia on Crestor incr to 40 by Cards & Fenofib160> f/u FLp is pending...    >He is overweight at 272# but he is actually down 12# this yr; encouraged to keep up the good work...    >Mult other problems as outlined below >>  ~  September 29, 2011:  7-31mo ROV & [name] reports doing well w/o new complaints or concerns... He is now "semi-retired" & working from home... Needs refills for his Vicodin, Soma, Xanax...    He saw [name] 9/12 for Cards f/u of his CAD> he was referred to [name] due to atrial arrhythmia- ?AFlutter, pt is essent asymptomatic, on Cardizem & Xarelto...    We reviewed prob list, meds, xrays and labs> see below for updates >> CXR 4/13 showed normal heart size, clear lungs, NAD... LABS 5/13:  FLP- ok x LDL=120;  Chems- ok x BUN=110 Creat=1.1 K=5.8 BS=110;  CBC- wnl;  TSH=1.14;  PSA=0.51          Problem List:  CIGARETTE SMOKER (ICD-305.1) - still  smokes ~1/2 ppd... we have prev discussed smoking cessation many times but he is not motivated to quit... "I'm working on it" he says... ~  9/12:  He agreed to try Chantix & Rx written along w/ rec to call 1-800- QUIT NOW help line...  PULMONARY NODULE (ICD-518.89) - CXR's have been normal... CTChest 12/02 w/ stable small nodules that are likely granulomas...  ~  CXR 9/09 showed stable- clear lungs and no nodules seen on plain film. ~  CXR 6/11 showed COPD/ emphysema, no nodules, NAD.Marland Kitchen. ~  CXR 4/13 showed normal heart size, clear lungs, NAD...  HYPERTENSION (ICD-401.9) - controlled w/ TOPROL XL 50mg /d, & DILTIAZEM 180mg /d...  ~  9/12:  BP= 106/72 and doing well.. denies HA, fatigue, visual changes, CP, dizziness,  syncope, dyspnea, edema, etc... ~  4/13:  BP= 110/72 & he remains largely asymptomatic...  CAD (ICD-414.00) - followed by [name] on ASA 81mg  daily but off now & on XERALTO per [name].. ~  cath 2001 w/ 3 vessel CAD- s/p PTCA/stent... ~  had NuclearStressTest 1/08= normal without ischemia & EF=60%... ~  He denies CP, change in SOB, edema, etc... exerc= yard, some walking prior to his back pain prob. ~  He saw [name] 9/12 for Cards f/u of his CAD> he was referred to [name] due to atrial arrhythmia- ?AFlutter, pt is essent asymptomatic, on Cardizem & Xarelto...  CARDIAC ARRHYTHMIA >> Hx SVT, PAF, AFlutter >> [name] indicated that his [place] score wasn't high enough for Coumadin therapy prev, but it was started 3/10 w/ incr PAF & ?TIA... ~  6/12:  He developed AFlutter & Cards decided to treat w/ XARELTO... Notes from [facility] & [name] are reviewed...  HYPERLIPIDEMIA (ICD-272.4) - now on CRESTOR 40mg /d, FENOFIBRATE 160mg /d, + "diet" therapy... prev on Zetia but stopped by Cards & Simva80 stopped w/ new FDA guidelines. ~  FLP 11/08 showed TChol 188, TG 170, HDL 31, LDL 123... ~  FLP 9/09 showed TChol 224, TG 176, HDL 28, LDL 143... rec- incr Simvast 80/d & better diet... ~  FLP 12/09 showed TChol 186, TG 161, HDL 31, LDL 123... rec contin Simva80 + [id number] & get wt down. ~  FLP 6/10 showed TChol 185, TG 253, HDL 32, LDL 118... rec> needs better diet + exercise, refer to [facility]. ~  FLP at work 2/11 (on Simva80+Feno160) showed TChol 184, TG 304, HDL 33, LDL 90 ~  5/11: changed from Simva80 to CRESTOR 20mg /d... ~  FLP 6/11 on Cres20+Feno160 showed TChol 168, TG 185, HDL 32, LDL 99 ~  FLP 12/11 on Cres20+Feno160 showed TChol 184, TG 242, HDL 30, LDL 108... needs better diet, gt wt down. ~  Cardiology increased the Crestor to 40mg  in the interim... Pt didn't ret for f/u FLP. ~  FLP 5/13 on Cres?40?80+Feno160 showed TChol 185, TG 124, HDL 40, LDL 120  OBESITY (RUE-454.09) - we discussed diet  and exercise program in detail... ~  9/09:    his weight is down 12# today to 322#.Marland Kitchen.  ~  weight 12/09 = 319# ~  weight 3/10 = 315# ~  weight 6/10 = 309# ~  weight 6/11 = 282#... keep up the great work... ~  weight 12/11 = 284... what happened? ~  Weight 9/12 = 272#... Continue the wt reduction... ~  Weight 4/13 = 281#  GERD - on OMEPRAZOLE 20mg /d... DIVERTICULOSIS OF COLON (ICD-562.10), & COLONIC POLYPS (ICD-211.3) - last colonoscopy was 2/06 by DrStark showing divertics, and several 2-23mm polyps (hyperplastic)... f/u planned  2014.Marland KitchenMarland Kitchen  BENIGN PROSTATIC HYPERTROPHY, WITH OBSTRUCTION (ICD-600.01),  Hx of TESTOSTERONE DEFICIENCY (ICD-257.2),  & ERECTILE DYSFUNCTION (ICD-302.72) - followed by Urology- [name] & [name]... ~  labs 12/11 showed Testosterone level = 420 (350-890)  BACK PAIN, LUMBAR (ICD-724.2) - Ortho eval by DrGraves w/ PT recommended & improved... he uses ETODOLAC 400mg Bid Prn, SOMA 350mg Tid Prn, & VICODIN Prn... ~  LumbarXRay 3/10 showed mild scoliosis, DDD w/ osteophytes & facet degen changes...   PERIPHERAL NEUROPATHY, LOWER EXTREMITY (ICD-355.8) - eval 3/10 by Autumn Patty w/ prob peripheral neuropathy found ?CMT ("I'm clumsey, I fall")... labs 3/10 showed borderline B 12 level = 273 by [name] & Rx'd w/ Vit B 12 568mcg/d by mouth. ~  labs here 6/10 showed B 12 level = 584 ~  f/u Neuro eval 10/11 by [name] for Gait abn, Hx TIA, ?CMT neuropathy, LE weakness, Tremor, FamHx Alz dis> wanted to do EMG/NCV & sleep study but pt declined.  ANXIETY DEPRESSION (ICD-300.4) - on ALPRAZOLAM 0.5mg  Tid Prn... under alot of stress> work, home life, & wife w/ some variant of musc dystrophy...  12/09 w/ confusional episode, couldn't make decision, eval [facility] w/ neg CT Brain & neg exam, subseq MRI w/ min atrophy/ sm vessel dis, NAD; Rx ASA 81mg /d.  ? of B12 DEFICIENCY (ICD-266.2) & VITAMIN D DEFICIENCY (ICD-268.9) - as noted Vit B12 was borderline per [name] 3/10 (273) and he was started on oral B12  supplement ~534mcg/d... repeat B12 level 6/10 = 584, improved... Vit D level = 18 6/10 & pt rec to start Vit D supplement w/ repeat Vit D level = 27 in Mar11... OK to continue 1000 u daily... ~  labs 6/11:  Vit B12= 606;  Vit D= 35... continue supplements.   Past Surgical History  Procedure Date  . Tonsillectomy   . Clavicle surgery   . Arm fracture surgery     Outpatient Encounter Prescriptions as of 09/29/2011  Medication Sig Dispense Refill  . ALPRAZolam (XANAX) 0.5 MG tablet Take 1 tablet (0.5 mg total) by mouth 3 (three) times daily as needed for sleep or anxiety.  90 tablet  0  . atorvastatin (LIPITOR) 80 MG tablet Take 1 tablet (80 mg total) by mouth daily.  30 tablet  11  . Calcium Carbonate-Vitamin D (CALCIUM 600+D) 600-400 MG-UNIT per tablet Take 1 tablet by mouth 2 (two) times daily.        . carisoprodol (SOMA) 350 MG tablet Take 1 tablet (350 mg total) by mouth 3 (three) times daily as needed for muscle spasms.  90 tablet  0  . cholecalciferol (VITAMIN D) 1000 UNITS tablet Take 1,000 Units by mouth daily.        Marland Kitchen diltiazem (CARDIZEM CD) 180 MG 24 hr capsule Take 1 capsule (180 mg total) by mouth daily.  30 capsule  3  . etodolac (LODINE) 400 MG tablet Take 1 tablet (400 mg total) by mouth 2 (two) times daily.  60 tablet  11  . fenofibrate 160 MG tablet Take 1 tablet (160 mg total) by mouth daily.  30 tablet  6  . HYDROcodone-acetaminophen (VICODIN) 5-500 MG per tablet 1 every 6 hours as needed for pain- do not exceed 3 tablets per day  90 tablet  0  . metoprolol (TOPROL-XL) 50 MG 24 hr tablet Take 1 tablet (50 mg total) by mouth daily.  30 tablet  11  . Multiple Vitamin (MULTIVITAMIN) tablet Take 1 tablet by mouth daily.        Marland Kitchen omeprazole (  PRILOSEC) 20 MG capsule Take 1 capsule (20 mg total) by mouth daily.  30 capsule  11  . vitamin B-12 (CYANOCOBALAMIN) 500 MCG tablet Take 500 mcg by mouth daily.        Carlena Hurl 20 MG TABS TAKE ONE TABLET BY MOUTH ONE TIME DAILY  30 tablet   4    Allergies  Allergen Reactions  . Tussionex Pennkinetic Er     REACTION: hives    Current Medications, Allergies, Past Medical History, Past Surgical History, Family History, and Social History were reviewed in [employer] record.    Review of Systems        See HPI - all other systems neg except as noted...  The patient complains of dyspnea on exertion, muscle weakness, and difficulty walking.  The patient denies anorexia, fever, weight loss, weight gain, vision loss, decreased hearing, hoarseness, chest pain, syncope, peripheral edema, prolonged cough, headaches, hemoptysis, abdominal pain, melena, hematochezia, severe indigestion/heartburn, hematuria, incontinence, suspicious skin lesions, transient blindness, depression, unusual weight change, abnormal bleeding, enlarged lymph nodes, and angioedema.     Objective:   Physical Exam    WD, Obese, 61 y/o WM in NAD... he is 6'6" tall and weighs 272lbs GENERAL:  Alert & oriented; pleasant & cooperative... HEENT:  [place]/AT, EOM-wnl, PERRLA, EACs-clear, TMs-wnl, NOSE-clear, THROAT-clear & wnl. NECK:  Supple w/ fairROM; no JVD; normal carotid impulses w/o bruits; no thyromegaly or nodules palpated; no lymphadenopathy. CHEST:  Clear to P & A; without wheezes/ rales/ or rhonchi. HEART:  Regular Rhythm; gr 1/6 SEM without rubs or gallops heard... ABDOMEN:  Soft & nontender; normal bowel sounds; no organomegaly or masses detected. EXT: without deformities, mod arthritic changes; no varicose veins/ +venous insuffic/ tr edema.  NEURO:  CN's intact;  no focal neuro deficits x sl decr sensation in LE's & wide based gait... DERM:  No lesions noted; no rash etc...  RADIOLOGY DATA:  Reviewed in the EPIC EMR & discussed w/ the patient...  LABORATORY DATA:  Reviewed in the EPIC EMR & discussed w/ the patient...   Assessment & Plan:   COPD/ Pulm Nodules on CT/ Cig Smoker>  Must quit all smoking, Chantix written;  CXR/ CT  reviewed likely granulomas, NAD...  HBP>  Controlled on BBlocker, CCB;  Continue same meds + diet & exercise...  CAD>  Followed by [name] & stable; last Myoview 2008 was neg; he is active yard etc...  Arrhythmias>  Followed by [name] w/ hx SVT, PAF, now AFlutter; he was switched from Coumadin to [facility]...  Hyperlipidemia>  Increased to Cr4es40 along w/ his diet & Fenofibrate;  F/u FLP is pending...  Obesity>  Nice job w/ wt reduction to 272# needs to keep up the good work...  GI> GERD, Divertics, Polyps>  Followed by DrStark & repeat colon due 2014...  GU> BPH, Low-T, ED>  Followed by [name]...  DJD, LBP>  On Etodolac, Soma, Vicodin for his chronic pain...  NEUROPATHY>  Followed by Autumn Patty w/ gait abn, TIA, ?CMT Neuropathy...  Hx Borderline B12 & low Vit D level>  He takes oral supplements...  Anxiety/ Depression>  On Alprazolam prn & he is hoping that retirement will help.Marland KitchenMarland Kitchen

## 2011-10-09 ENCOUNTER — Other Ambulatory Visit (INDEPENDENT_AMBULATORY_CARE_PROVIDER_SITE_OTHER): Payer: BC Managed Care – PPO

## 2011-10-09 DIAGNOSIS — E291 Testicular hypofunction: Secondary | ICD-10-CM

## 2011-10-09 DIAGNOSIS — F341 Dysthymic disorder: Secondary | ICD-10-CM

## 2011-10-09 DIAGNOSIS — E785 Hyperlipidemia, unspecified: Secondary | ICD-10-CM

## 2011-10-09 DIAGNOSIS — D126 Benign neoplasm of colon, unspecified: Secondary | ICD-10-CM

## 2011-10-09 DIAGNOSIS — I10 Essential (primary) hypertension: Secondary | ICD-10-CM

## 2011-10-09 LAB — BASIC METABOLIC PANEL
CO2: 28 mEq/L (ref 19–32)
Calcium: 9.9 mg/dL (ref 8.4–10.5)
Creatinine, Ser: 1.1 mg/dL (ref 0.4–1.5)
Glucose, Bld: 110 mg/dL — ABNORMAL HIGH (ref 70–99)

## 2011-10-09 LAB — CBC WITH DIFFERENTIAL/PLATELET
Basophils Relative: 0.6 % (ref 0.0–3.0)
Eosinophils Relative: 3.7 % (ref 0.0–5.0)
Hemoglobin: 16.1 g/dL (ref 13.0–17.0)
Lymphocytes Relative: 30.4 % (ref 12.0–46.0)
Monocytes Relative: 9.5 % (ref 3.0–12.0)
Neutro Abs: 2.9 10*3/uL (ref 1.4–7.7)
RBC: 5.09 Mil/uL (ref 4.22–5.81)

## 2011-10-09 LAB — HEPATIC FUNCTION PANEL
Albumin: 4.2 g/dL (ref 3.5–5.2)
Bilirubin, Direct: 0.2 mg/dL (ref 0.0–0.3)
Total Protein: 6.6 g/dL (ref 6.0–8.3)

## 2011-10-09 LAB — LIPID PANEL
HDL: 39.8 mg/dL (ref >39.00)
Total CHOL/HDL Ratio: 5
Triglycerides: 124 mg/dL (ref 0.0–149.0)

## 2011-11-10 ENCOUNTER — Other Ambulatory Visit: Payer: Self-pay | Admitting: *Deleted

## 2011-11-10 MED ORDER — DILTIAZEM HCL ER COATED BEADS 180 MG PO CP24: 180.0000 mg | ORAL_CAPSULE | Freq: Every day | ORAL | Status: DC

## 2012-01-18 ENCOUNTER — Other Ambulatory Visit: Payer: Self-pay | Admitting: Internal Medicine

## 2012-02-12 ENCOUNTER — Other Ambulatory Visit: Payer: Self-pay | Admitting: Pulmonary Disease

## 2012-02-14 ENCOUNTER — Encounter: Payer: Self-pay | Admitting: Cardiology

## 2012-02-16 ENCOUNTER — Encounter: Payer: Self-pay | Admitting: Cardiology

## 2012-02-16 ENCOUNTER — Ambulatory Visit (INDEPENDENT_AMBULATORY_CARE_PROVIDER_SITE_OTHER): Payer: BC Managed Care – PPO | Admitting: Cardiology

## 2012-02-16 VITALS — BP 111/73 | HR 50 | Resp 18 | Ht 78.0 in | Wt 272.4 lb

## 2012-02-16 DIAGNOSIS — Z7901 Long term (current) use of anticoagulants: Secondary | ICD-10-CM

## 2012-02-16 DIAGNOSIS — Z9229 Personal history of other drug therapy: Secondary | ICD-10-CM

## 2012-02-16 DIAGNOSIS — I2510 Atherosclerotic heart disease of native coronary artery without angina pectoris: Secondary | ICD-10-CM

## 2012-02-16 DIAGNOSIS — I10 Essential (primary) hypertension: Secondary | ICD-10-CM

## 2012-02-16 DIAGNOSIS — I4892 Unspecified atrial flutter: Secondary | ICD-10-CM

## 2012-02-16 DIAGNOSIS — E785 Hyperlipidemia, unspecified: Secondary | ICD-10-CM

## 2012-02-16 DIAGNOSIS — F17200 Nicotine dependence, unspecified, uncomplicated: Secondary | ICD-10-CM

## 2012-02-16 NOTE — Assessment & Plan Note (Signed)
The patient does continue to smoke. He smoking approximately 10 cigarettes a day. I have again counseled him to stop.

## 2012-02-16 NOTE — Assessment & Plan Note (Signed)
His lipids are being followed and carefully treated by primary care.

## 2012-02-16 NOTE — Patient Instructions (Signed)
Your physician recommends that you continue on your current medications as directed. Please refer to the Current Medication list given to you today.  Your physician wants you to follow-up in: 1 year. You will receive a reminder letter in the mail two months in advance. If you don't receive a letter, please call our office to schedule the follow-up appointment.  

## 2012-02-16 NOTE — Progress Notes (Signed)
HPI Patient is seen for one year followup. He is doing very well. He is not having any palpitations. We had found his rhythm to be atrial flutter. Decision was made to follow him medically. This included consultation with EP team. He is on Xarelto and doing extremely well.  I saw him last one year ago on February 12, 2011.  Allergies  Allergen Reactions  . Hydrocod Polst-Cpm Polst Er     REACTION: hives    Current Outpatient Prescriptions  Medication Sig Dispense Refill  . ALPRAZolam (XANAX) 0.5 MG tablet Take 1 tablet (0.5 mg total) by mouth 3 (three) times daily as needed for sleep or anxiety.  90 tablet  5  . Calcium Carbonate-Vitamin D (CALCIUM 600+D) 600-400 MG-UNIT per tablet Take 1 tablet by mouth 2 (two) times daily.        . carisoprodol (SOMA) 350 MG tablet Take 1 tablet (350 mg total) by mouth 3 (three) times daily as needed for muscle spasms.  90 tablet  5  . cholecalciferol (VITAMIN D) 1000 UNITS tablet Take 2,000 Units by mouth daily.       Marland Kitchen diltiazem (CARDIZEM CD) 180 MG 24 hr capsule Take 1 capsule (180 mg total) by mouth daily.  30 capsule  6  . etodolac (LODINE) 400 MG tablet Take 1 tablet (400 mg total) by mouth 2 (two) times daily.  60 tablet  11  . fenofibrate 160 MG tablet Take 1 tablet (160 mg total) by mouth daily.  30 tablet  6  . HYDROcodone-acetaminophen (VICODIN) 5-500 MG per tablet 1 every 6 hours as needed for pain- do not exceed 3 tablets per day  90 tablet  5  . metoprolol (TOPROL-XL) 50 MG 24 hr tablet Take 1 tablet (50 mg total) by mouth daily.  30 tablet  11  . Multiple Vitamin (MULTIVITAMIN) tablet Take 1 tablet by mouth daily.        Marland Kitchen omeprazole (PRILOSEC) 20 MG capsule TAKE ONE CAPSULE BY MOUTH ONE TIME DAILY  30 capsule  10  . vitamin B-12 (CYANOCOBALAMIN) 500 MCG tablet Take 500 mcg by mouth daily.        Carlena Hurl 20 MG TABS TAKE ONE TABLET BY MOUTH ONE TIME DAILY  30 tablet  4  . DISCONTD: XARELTO 20 MG TABS TAKE ONE TABLET BY MOUTH ONE TIME  DAILY  30 tablet  4  . atorvastatin (LIPITOR) 80 MG tablet Take 1 tablet (80 mg total) by mouth daily.  30 tablet  11    History   Social History  . Marital Status: Married    Spouse Name: N/A    Number of Children: N/A  . Years of Education: N/A   Occupational History  . Not on file.   Social History Main Topics  . Smoking status: Current Everyday Smoker -- 0.3 packs/day  . Smokeless tobacco: Not on file   Comment: smokes about 10 daily  . Alcohol Use: 2.5 oz/week    5 drink(s) per week  . Drug Use: Not on file  . Sexually Active: Not on file   Other Topics Concern  . Not on file   Social History Narrative  . No narrative on file    Family History  Problem Relation Age of Onset  . Stroke Mother   . Alzheimer's disease Mother   . Heart failure Father   . Stroke Father   . Dementia    . Clotting disorder      Past Medical  History  Diagnosis Date  . Pulmonary nodule   . HTN (hypertension)   . CAD (coronary artery disease)     stent, 2001 / nuclear, June, 2010, no ischemia  . PAF (paroxysmal atrial fibrillation)   . Dyslipidemia   . Obesity   . Diverticulosis of colon   . History of colonic polyps   . BPH (benign prostatic hypertrophy)   . Testosterone deficiency   . ED (erectile dysfunction)   . Lumbar back pain   . Peripheral neuropathy     lower extremities  . Anxiety and depression   . Vitamin d deficiency   . Blurred vision     episode with confusion, evaluated Dr. Sandria Manly  . Ejection fraction     EF 60%, echo, November, 2008  . B12 deficiency   . Scalp lesion     November, 2011  . Warfarin anticoagulation   . Atrial flutter     New diagnosis November 10, 2010, rate controlled    Past Surgical History  Procedure Date  . Tonsillectomy   . Clavicle surgery   . Arm fracture surgery     ROS   Patient denies fever, chills, headache, sweats, rash, change in vision, change in hearing, chest pain, cough, nausea vomiting, urinary symptoms. All other  systems are reviewed and are negative.  PHYSICAL EXAM  Patient is stable. He is overweight but his weight is relatively stable. He is tanned today. There is no jugulovenous distention. Lungs are clear. Respiratory effort is nonlabored. Cardiac exam reveals S1 and S2. There no clicks or significant murmurs. The abdomen is soft. There is no peripheral edema. There no musculoskeletal deformities. There no skin rashes.  Filed Vitals:   02/16/12 1003  BP: 111/73  Pulse: 50  Resp: 18  Height: 6\' 6"  (1.981 m)  Weight: 272 lb 6.4 oz (123.56 kg)  SpO2: 98%   EKG is done today and reviewed by me. There is sinus rhythm with sinus bradycardia.  ASSESSMENT & PLAN

## 2012-02-16 NOTE — Assessment & Plan Note (Signed)
Patient is on Xarelto. He is very happy with this drug. This is to be continued.

## 2012-02-16 NOTE — Assessment & Plan Note (Signed)
Coronary disease is stable. He had a nuclear scan in 2010. He is not having symptoms. He does not need any exercise testing at this point.

## 2012-02-16 NOTE — Assessment & Plan Note (Signed)
Blood pressure is controlled. No change in therapy. 

## 2012-02-16 NOTE — Assessment & Plan Note (Signed)
The patient has atrial flutter. The rate had been controlled. Now he is in sinus rhythm with mild sinus bradycardia. He's doing very well. No change in therapy.

## 2012-02-23 ENCOUNTER — Other Ambulatory Visit: Payer: Self-pay | Admitting: Pulmonary Disease

## 2012-02-23 NOTE — Addendum Note (Signed)
Addended by: Lacie Scotts on: 02/23/2012 11:55 AM   Modules accepted: Orders

## 2012-03-07 ENCOUNTER — Other Ambulatory Visit: Payer: Self-pay | Admitting: Pulmonary Disease

## 2012-03-11 ENCOUNTER — Telehealth: Payer: Self-pay | Admitting: Cardiology

## 2012-03-11 NOTE — Telephone Encounter (Signed)
New Problem:    Patient called in wanting to know if there were any samples of XARELTO 20 MG TABS available for him to have.  Please call back.

## 2012-03-11 NOTE — Telephone Encounter (Signed)
Pt notified samples of xarelto will be at front desk--pt agrees

## 2012-03-14 ENCOUNTER — Telehealth: Payer: Self-pay | Admitting: Pulmonary Disease

## 2012-03-14 ENCOUNTER — Other Ambulatory Visit: Payer: Self-pay | Admitting: *Deleted

## 2012-03-14 DIAGNOSIS — E785 Hyperlipidemia, unspecified: Secondary | ICD-10-CM

## 2012-03-14 DIAGNOSIS — I2510 Atherosclerotic heart disease of native coronary artery without angina pectoris: Secondary | ICD-10-CM

## 2012-03-14 MED ORDER — ATORVASTATIN CALCIUM 80 MG PO TABS: 80.0000 mg | ORAL_TABLET | Freq: Every day | ORAL | Status: DC

## 2012-03-14 NOTE — Telephone Encounter (Signed)
Refilled Atorvastatin.

## 2012-03-14 NOTE — Telephone Encounter (Signed)
Per EPIC lipitor has been refilled by Dr. Myrtis Ser. Epic also shows Dr. Myrtis Ser sent this into the pharmacy today. I spoke with pt and made him aware of this. Nothing further was needed

## 2012-04-05 NOTE — Telephone Encounter (Signed)
New problem:  Samples of Xarelto . 20 mg.      Message attached to refill request.

## 2012-04-07 NOTE — Telephone Encounter (Signed)
Samples left at the front desk.  Pt was notified. 

## 2012-04-07 NOTE — Telephone Encounter (Signed)
Follow-up:    PAtient called in to follow -up on his call about samples of XARELTO 20 MG TABS.  Please call back.

## 2012-04-19 ENCOUNTER — Other Ambulatory Visit: Payer: Self-pay | Admitting: *Deleted

## 2012-04-19 MED ORDER — ALPRAZOLAM 0.5 MG PO TABS: 0.5000 mg | ORAL_TABLET | Freq: Three times a day (TID) | ORAL | Status: DC | PRN

## 2012-04-20 ENCOUNTER — Other Ambulatory Visit: Payer: Self-pay | Admitting: Pulmonary Disease

## 2012-04-28 ENCOUNTER — Other Ambulatory Visit: Payer: Self-pay | Admitting: Pulmonary Disease

## 2012-04-28 MED ORDER — HYDROCODONE-ACETAMINOPHEN 5-500 MG PO TABS: ORAL_TABLET | ORAL | Status: DC

## 2012-04-28 NOTE — Telephone Encounter (Signed)
Per Leigh, okay to fill 1 time only. Pt will need an appointment for any further refills.

## 2012-05-09 ENCOUNTER — Telehealth: Payer: Self-pay | Admitting: Pulmonary Disease

## 2012-05-09 MED ORDER — CARISOPRODOL 350 MG PO TABS: 350.0000 mg | ORAL_TABLET | Freq: Three times a day (TID) | ORAL | Status: DC | PRN

## 2012-05-09 NOTE — Telephone Encounter (Signed)
Per SN---ok to refill the soma #90 with 5 refills.  thanks

## 2012-05-09 NOTE — Telephone Encounter (Signed)
Last OV 09-29-11. Vicodin was sent on 04-28-12, pt states he does not needs this. He states he does need soma refilled, last refill on 09-29-11 #90 x 5 refills. I scheduled pt for an appt on 06-16-11 for follow-up. Please advise if ok to send refills for soma. Thanks.Carron Curie, CMA Allergies  Allergen Reactions  . Hydrocod Polst-Cpm Polst Er     REACTION: hives

## 2012-05-09 NOTE — Telephone Encounter (Signed)
Pt aware that his prescription has been called in.

## 2012-05-10 ENCOUNTER — Telehealth: Payer: Self-pay | Admitting: Cardiology

## 2012-05-10 NOTE — Telephone Encounter (Signed)
20 pills left at the front desk.  Pt was notified.

## 2012-05-10 NOTE — Telephone Encounter (Signed)
plz return call to pt regarding xarelto 20 mg Samples, he can be reached at [phone]. plz leave message on vm if pt does not answer to advise

## 2012-05-17 NOTE — Telephone Encounter (Signed)
Samples left at the front desk.  LM for pt on recorder to pick them up.

## 2012-05-17 NOTE — Telephone Encounter (Signed)
New problem:  Sample of xarelto  20 mg. Was told by nurse to call back.

## 2012-05-20 ENCOUNTER — Other Ambulatory Visit: Payer: Self-pay | Admitting: Pulmonary Disease

## 2012-05-23 ENCOUNTER — Telehealth: Payer: Self-pay | Admitting: Pulmonary Disease

## 2012-05-23 MED ORDER — ALPRAZOLAM 0.5 MG PO TABS: 0.5000 mg | ORAL_TABLET | Freq: Three times a day (TID) | ORAL | Status: DC | PRN

## 2012-05-23 NOTE — Telephone Encounter (Signed)
Refill of the alprazolam has been called to the pharmacy.

## 2012-05-23 NOTE — Telephone Encounter (Signed)
Refill has already been called to the pharmacy.  

## 2012-06-15 ENCOUNTER — Ambulatory Visit (INDEPENDENT_AMBULATORY_CARE_PROVIDER_SITE_OTHER): Payer: BC Managed Care – PPO | Admitting: Pulmonary Disease

## 2012-06-15 ENCOUNTER — Encounter: Payer: Self-pay | Admitting: Pulmonary Disease

## 2012-06-15 VITALS — BP 130/82 | HR 61 | Temp 97.8°F | Ht 78.0 in | Wt 283.6 lb

## 2012-06-15 DIAGNOSIS — E785 Hyperlipidemia, unspecified: Secondary | ICD-10-CM

## 2012-06-15 DIAGNOSIS — E669 Obesity, unspecified: Secondary | ICD-10-CM

## 2012-06-15 DIAGNOSIS — I4892 Unspecified atrial flutter: Secondary | ICD-10-CM

## 2012-06-15 DIAGNOSIS — I2510 Atherosclerotic heart disease of native coronary artery without angina pectoris: Secondary | ICD-10-CM

## 2012-06-15 DIAGNOSIS — F17200 Nicotine dependence, unspecified, uncomplicated: Secondary | ICD-10-CM

## 2012-06-15 DIAGNOSIS — F341 Dysthymic disorder: Secondary | ICD-10-CM

## 2012-06-15 DIAGNOSIS — G5790 Unspecified mononeuropathy of unspecified lower limb: Secondary | ICD-10-CM

## 2012-06-15 DIAGNOSIS — K219 Gastro-esophageal reflux disease without esophagitis: Secondary | ICD-10-CM

## 2012-06-15 DIAGNOSIS — E291 Testicular hypofunction: Secondary | ICD-10-CM

## 2012-06-15 DIAGNOSIS — K5730 Diverticulosis of large intestine without perforation or abscess without bleeding: Secondary | ICD-10-CM

## 2012-06-15 DIAGNOSIS — D126 Benign neoplasm of colon, unspecified: Secondary | ICD-10-CM

## 2012-06-15 DIAGNOSIS — N401 Enlarged prostate with lower urinary tract symptoms: Secondary | ICD-10-CM

## 2012-06-15 DIAGNOSIS — M545 Low back pain: Secondary | ICD-10-CM

## 2012-06-15 DIAGNOSIS — I10 Essential (primary) hypertension: Secondary | ICD-10-CM

## 2012-06-15 MED ORDER — METOPROLOL SUCCINATE ER 50 MG PO TB24: 50.0000 mg | ORAL_TABLET | Freq: Every day | ORAL | Status: DC

## 2012-06-15 MED ORDER — RIVAROXABAN 20 MG PO TABS: 20.0000 mg | ORAL_TABLET | Freq: Every day | ORAL | Status: DC

## 2012-06-15 MED ORDER — ATORVASTATIN CALCIUM 80 MG PO TABS: 80.0000 mg | ORAL_TABLET | Freq: Every day | ORAL | Status: DC

## 2012-06-15 MED ORDER — ETODOLAC 400 MG PO TABS: 400.0000 mg | ORAL_TABLET | Freq: Two times a day (BID) | ORAL | Status: DC

## 2012-06-15 MED ORDER — FENOFIBRATE 160 MG PO TABS: 160.0000 mg | ORAL_TABLET | Freq: Every day | ORAL | Status: DC

## 2012-06-15 MED ORDER — CARISOPRODOL 350 MG PO TABS: 350.0000 mg | ORAL_TABLET | Freq: Three times a day (TID) | ORAL | Status: DC | PRN

## 2012-06-15 MED ORDER — ALPRAZOLAM 0.5 MG PO TABS: 0.5000 mg | ORAL_TABLET | Freq: Three times a day (TID) | ORAL | Status: DC | PRN

## 2012-06-15 MED ORDER — OMEPRAZOLE 20 MG PO CPDR: 20.0000 mg | DELAYED_RELEASE_CAPSULE | Freq: Every day | ORAL | Status: DC

## 2012-06-15 MED ORDER — HYDROCODONE-ACETAMINOPHEN 5-325 MG PO TABS: 1.0000 | ORAL_TABLET | Freq: Three times a day (TID) | ORAL | Status: DC | PRN

## 2012-06-15 MED ORDER — DILTIAZEM HCL ER COATED BEADS 180 MG PO CP24: 180.0000 mg | ORAL_CAPSULE | Freq: Every day | ORAL | Status: DC

## 2012-06-15 NOTE — Progress Notes (Signed)
Subjective:    Patient ID: Brendan [name], male    DOB: Jan 29, 1951, 62 y.o.   MRN: [id number]  HPI 62 y/o WM here for a follow up visit... he has mult med problems including continued smoking & underlying COPD;  HBP;  CAD & PAF followed by [name] on Coumadin via the CC;  Hyperlipidemia & Obesity;  LBP & Neuropathy w/ eval from [name];  Anxiety & some depression...  ~  February 11, 2011:  83mo ROV & he is here for med refills, not fasting today & he will ret for Fasting blood work soon... He tells me he is going to retire soon...    Still smoking ~1/2ppd w/ mild cough, AM sputum c/w chr bronchitis; he'll give the Chantix a try & advised to call quit line as well; known sm granulomas on prev CT, not seen on CXRs...    He is followed for CARDS by [name] & [name]> BP controlled on MetoprololER50 & Diltiazem180 w/ BP today 106/72; known CAD w/ prev PTCA & stent> denies angina, ch in SOB, edema; known PAFib/ Flutter w/ palpit on XERALTO & off Dig & Coumadin> notes from [facility] & [name] reviewed...    He has Hyperlipidemia on Crestor incr to 40 by Cards & Fenofib160> f/u FLp is pending...    He is overweight at 272# but he is actually down 12# this yr; encouraged to keep up the good work...    Mult other problems as outlined below >>  ~  September 29, 2011:  7-83mo ROV & Brendan [name] reports doing well w/o new complaints or concerns... He is now "semi-retired" & working from home... Needs refills for his Vicodin, Soma, Xanax...    He saw [name] 9/12 for Cards f/u of his CAD> he was referred to [name] due to atrial arrhythmia- ?AFlutter, pt is essent asymptomatic, on Cardizem & Xarelto...    We reviewed prob list, meds, xrays and labs> see below for updates >> CXR 4/13 showed normal heart size, clear lungs, NAD... LABS 5/13:  FLP- ok x LDL=120;  Chems- ok x BUN=110 Creat=1.1 K=5.8 BS=110;  CBC- wnl;  TSH=1.14;  PSA=0.51  ~  June 15, 2012:  83mo ROV & Brendan [name] is under considerable stress w/ Family issues- wife medical  issues (ocular pharyngeal MD- refuses feeding tube etc)& her daugh, grand daugh & one great grand all under one roof; he is coping the best he can; now smoking E-cig & hoping this will help; denies cough, sput, hemoptysis, dyspnea, etc; BP controlled on meds and he continues on Xarelto20 for his hxAFlutter per [name]; Lipids fair on max meds- he understands that any further improvement mut come from diet, exercise, wt reduction; weight is 284# (up 3# intervally);  See prob list below >>    We reviewed prob list, meds, xrays and labs> see below for updates >>  LABS 1/14:  FLP- TChol 185, TG 152, HDL 36, LDL 119 on Atorva80+Feno160; Chems- wnl w/ Creat=1.4;  CBC- wnl;  TSH=1.37;  PSA=0.42           Problem List:  CIGARETTE SMOKER (ICD-305.1) - still smokes ~now trying E-cig... we have prev discussed smoking cessation many times but he is not motivated to quit... "I'm working on it" he says... ~  9/12:  He agreed to try Chantix & Rx written along w/ rec to call 1-800- QUIT NOW help line... ~  1/14:  Says he's switched to E-cig & hoping this will help; we discussed this & asked to wean.Marland KitchenMarland Kitchen  PULMONARY NODULE (ICD-518.89) - CXR's have been normal... CTChest 12/02 w/ stable small nodules that are likely granulomas...  ~  CXR 9/09 showed stable- clear lungs and no nodules seen on plain film. ~  CXR 6/11 showed COPD/ emphysema, no nodules, NAD.Marland Kitchen. ~  CXR 4/13 showed normal heart size, clear lungs, NAD...  HYPERTENSION (ICD-401.9) - controlled w/ TOPROL XL 50mg /d, & DILTIAZEM 180mg /d...  ~  9/12:  BP= 106/72 and doing well.. denies HA, fatigue, visual changes, CP, dizziness, syncope, dyspnea, edema, etc... ~  4/13:  BP= 110/72 & he remains largely asymptomatic... ~  1/14:  BP= 130/82 & he denies HA, visual sx, CP, palpit, SOB, edema, etc...  CAD (ICD-414.00) - followed by [name] on ASA 81mg  daily but off now & on XARELTO20 per [name].. ~  cath 2001 w/ 3 vessel CAD- s/p PTCA/stent... ~  had  NuclearStressTest 1/08= normal without ischemia & EF=60%... ~  He denies CP, change in SOB, edema, etc... exerc= yard, some walking prior to his back pain prob. ~  He saw [name] 9/12 for Cards f/u of his CAD> he was referred to [name] due to atrial arrhythmia- ?AFlutter, pt is essent asymptomatic, on Cardizem & Xarelto... ~  He had f/u [name] 9/13> doing well, no palpit, continues on Xarelto; pt was asked to stop smoking, continue same meds, now in [place] & doing well... ~  EKG 9/13 showed SBrady, rate50, 1st degree AVB, otherw wnl...  CARDIAC ARRHYTHMIA >> Hx SVT, PAF, AFlutter >> [name] indicated that his [place] score wasn't high enough for Coumadin therapy prev, but it was started 3/10 w/ incr PAF & ?TIA... ~  6/12:  He developed AFlutter & Cards decided to treat w/ XARELTO... Notes from [facility] & [name] are reviewed... ~  2DEcho 7/12 showed norm wall thickness, norm LVF w/ EF=55-65%, normal wall motion, Gr2 DD, mild dil LA&RA...  HYPERLIPIDEMIA (ICD-272.4) - now on ATORVASTATIN 80mg /d, FENOFIBRATE 160mg /d, + "diet" therapy... prev on Zetia but stopped by Cards & Simva80 stopped w/ new FDA guidelines. ~  FLP 11/08 showed TChol 188, TG 170, HDL 31, LDL 123... ~  FLP 9/09 showed TChol 224, TG 176, HDL 28, LDL 143... rec- incr Simvast 80/d & better diet... ~  FLP 12/09 showed TChol 186, TG 161, HDL 31, LDL 123... rec contin Simva80 + [id number] & get wt down. ~  FLP 6/10 showed TChol 185, TG 253, HDL 32, LDL 118... rec> needs better diet + exercise, refer to [facility]. ~  FLP at work 2/11 (on Simva80+Feno160) showed TChol 184, TG 304, HDL 33, LDL 90 ~  5/11: changed from Simva80 to CRESTOR 20mg /d... ~  FLP 6/11 on Cres20+Feno160 showed TChol 168, TG 185, HDL 32, LDL 99 ~  FLP 12/11 on Cres20+Feno160 showed TChol 184, TG 242, HDL 30, LDL 108... needs better diet, gt wt down. ~  Cardiology increased the Crestor to 40mg  in the interim... Pt didn't ret for f/u FLP. ~  FLP 5/13 on Cres?40?80+Feno160  showed TChol 185, TG 124, HDL 40, LDL 120... Cres changed to [facility]... ~  FLP 1/14 on Atorva80+Feno160 showed TChol 185, TG 152, HDL 36, LDL 119   OBESITY (ICD-278.00) - we discussed diet and exercise program in detail... ~  9/09:    his weight is down 12# today to 322#.Marland Kitchen.  ~  weight 12/09 = 319# ~  weight 3/10 = 315# ~  weight 6/10 = 309# ~  weight 6/11 = 282#... keep up the great work... ~  weight  12/11 = 284... what happened? ~  Weight 9/12 = 272#... Continue the wt reduction... ~  Weight 4/13 = 281# ~  Weight 1/14 = 284#  GERD - on OMEPRAZOLE 20mg /d... DIVERTICULOSIS OF COLON (ICD-562.10), & COLONIC POLYPS (ICD-211.3) - last colonoscopy was 2/06 by DrStark showing divertics, and several 2-36mm polyps (hyperplastic)... f/u planned 2014...  BENIGN PROSTATIC HYPERTROPHY, WITH OBSTRUCTION (ICD-600.01),  Hx of TESTOSTERONE DEFICIENCY (ICD-257.2),  & ERECTILE DYSFUNCTION (ICD-302.72) - followed by Urology- [name] & [name]... ~  labs 12/11 showed Testosterone level = 420 (350-890)  BACK PAIN, LUMBAR (ICD-724.2) - Ortho eval by DrGraves w/ PT recommended & improved... he uses ETODOLAC 400mg Bid Prn, SOMA 350mg Tid Prn, & VICODIN Prn... ~  LumbarXRay 3/10 showed mild scoliosis, DDD w/ osteophytes & facet degen changes...   PERIPHERAL NEUROPATHY, LOWER EXTREMITY (ICD-355.8) - eval 3/10 by Autumn Patty w/ prob peripheral neuropathy found ?CMT ("I'm clumsey, I fall")... labs 3/10 showed borderline B 12 level = 273 by [name] & Rx'd w/ Vit B 12 557mcg/d by mouth. ~  labs here 6/10 showed B 12 level = 584 ~  f/u Neuro eval 10/11 by [name] for Gait abn, Hx TIA, ?CMT neuropathy, LE weakness, Tremor, FamHx Alz dis> wanted to do EMG/NCV & sleep study but pt declined.  ANXIETY DEPRESSION (ICD-300.4) - on ALPRAZOLAM 0.5mg  Tid Prn... under alot of stress> work, home life, & wife w/ some variant of musc dystrophy...  12/09 w/ confusional episode, couldn't make decision, eval [facility] w/ neg CT Brain & neg exam,  subseq MRI w/ min atrophy/ sm vessel dis, NAD; Rx ASA 81mg /d.  ? of B12 DEFICIENCY (ICD-266.2) & VITAMIN D DEFICIENCY (ICD-268.9) - as noted Vit B12 was borderline per [name] 3/10 (273) and he was started on oral B12 supplement ~563mcg/d... repeat B12 level 6/10 = 584, improved... Vit D level = 18 6/10 & pt rec to start Vit D supplement w/ repeat Vit D level = 27 in Mar11... OK to continue 1000 u daily... ~  labs 6/11:  Vit B12= 606;  Vit D= 35... continue supplements.   Past Surgical History  Procedure Date  . Tonsillectomy   . Clavicle surgery   . Arm fracture surgery     Outpatient Encounter Prescriptions as of 06/15/2012  Medication Sig Dispense Refill  . ALPRAZolam (XANAX) 0.5 MG tablet Take 1 tablet (0.5 mg total) by mouth 3 (three) times daily as needed for sleep or anxiety.  90 tablet  0  . atorvastatin (LIPITOR) 80 MG tablet Take 1 tablet (80 mg total) by mouth daily.  30 tablet  5  . Calcium Carbonate-Vitamin D (CALCIUM 600+D) 600-400 MG-UNIT per tablet Take 1 tablet by mouth 2 (two) times daily.        . carisoprodol (SOMA) 350 MG tablet Take 1 tablet (350 mg total) by mouth 3 (three) times daily as needed for muscle spasms.  90 tablet  5  . cholecalciferol (VITAMIN D) 1000 UNITS tablet Take 2,000 Units by mouth daily.       Marland Kitchen diltiazem (CARDIZEM CD) 180 MG 24 hr capsule Take 1 capsule (180 mg total) by mouth daily.  30 capsule  6  . etodolac (LODINE) 400 MG tablet TAKE ONE TABLET BY MOUTH TWICE DAILY  60 tablet  11  . fenofibrate 160 MG tablet TAKE ONE TABLET BY MOUTH ONE TIME DAILY  30 tablet  5  . HYDROcodone-acetaminophen (VICODIN) 5-500 MG per tablet 1 every 6 hours as needed for pain- do not exceed 3 tablets per  day  90 tablet  0  . metoprolol succinate (TOPROL-XL) 50 MG 24 hr tablet TAKE ONE TABLET BY MOUTH ONE TIME DAILY  30 tablet  1  . Multiple Vitamin (MULTIVITAMIN) tablet Take 1 tablet by mouth daily.        Marland Kitchen omeprazole (PRILOSEC) 20 MG capsule TAKE ONE CAPSULE BY  MOUTH ONE TIME DAILY  30 capsule  10  . vitamin B-12 (CYANOCOBALAMIN) 500 MCG tablet Take 500 mcg by mouth daily.        Carlena Hurl 20 MG TABS TAKE ONE TABLET BY MOUTH ONE TIME DAILY  30 tablet  4    Allergies  Allergen Reactions  . Hydrocod Polst-Cpm Polst Er     REACTION: hives    Current Medications, Allergies, Past Medical History, Past Surgical History, Family History, and Social History were reviewed in [employer] record.    Review of Systems        See HPI - all other systems neg except as noted...  The patient complains of dyspnea on exertion, muscle weakness, and difficulty walking.  The patient denies anorexia, fever, weight loss, weight gain, vision loss, decreased hearing, hoarseness, chest pain, syncope, peripheral edema, prolonged cough, headaches, hemoptysis, abdominal pain, melena, hematochezia, severe indigestion/heartburn, hematuria, incontinence, suspicious skin lesions, transient blindness, depression, unusual weight change, abnormal bleeding, enlarged lymph nodes, and angioedema.     Objective:   Physical Exam    WD, Obese, 62 y/o WM in NAD... he is 6'6" tall and weighs 272lbs GENERAL:  Alert & oriented; pleasant & cooperative... HEENT:  [place]/AT, EOM-wnl, PERRLA, EACs-clear, TMs-wnl, NOSE-clear, THROAT-clear & wnl. NECK:  Supple w/ fairROM; no JVD; normal carotid impulses w/o bruits; no thyromegaly or nodules palpated; no lymphadenopathy. CHEST:  Clear to P & A; without wheezes/ rales/ or rhonchi. HEART:  Regular Rhythm; gr 1/6 SEM without rubs or gallops heard... ABDOMEN:  Soft & nontender; normal bowel sounds; no organomegaly or masses detected. EXT: without deformities, mod arthritic changes; no varicose veins/ +venous insuffic/ tr edema.  NEURO:  CN's intact;  no focal neuro deficits x sl decr sensation in LE's & wide based gait... DERM:  No lesions noted; no rash etc...  RADIOLOGY DATA:  Reviewed in the EPIC EMR & discussed w/ the  patient...  LABORATORY DATA:  Reviewed in the EPIC EMR & discussed w/ the patient...   Assessment & Plan:    COPD/ Pulm Nodules on CT/ Cig Smoker>  Must quit all smoking, Chantix written;  CXR/ CT reviewed likely granulomas, NAD...  HBP>  Controlled on BBlocker, CCB;  Continue same meds + diet & exercise...  CAD>  Followed by [name] & stable; last Myoview 2008 was neg; he is active yard etc...  Arrhythmias>  Followed by [name] w/ hx SVT, PAF, now AFlutter; he was switched from Coumadin to [facility]...  Hyperlipidemia>  Increased to Cr4es40 along w/ his diet & Fenofibrate;  F/u FLP is pending...  Obesity>  Nice job w/ wt reduction to 272# needs to keep up the good work...  GI> GERD, Divertics, Polyps>  Followed by DrStark & repeat colon due 2014...  GU> BPH, Low-T, ED>  Followed by [name]...  DJD, LBP>  On Etodolac, Soma, Vicodin for his chronic pain...  NEUROPATHY>  Followed by Autumn Patty w/ gait abn, TIA, ?CMT Neuropathy...  Hx Borderline B12 & low Vit D level>  He takes oral supplements...  Anxiety/ Depression>  On Alprazolam prn & he is hoping that retirement will help...   Patient's  Medications  New Prescriptions   HYDROCODONE-ACETAMINOPHEN (NORCO/VICODIN) 5-325 MG PER TABLET    Take 1 tablet by mouth 3 (three) times daily as needed for pain.  Previous Medications   CALCIUM CARBONATE-VITAMIN D (CALCIUM 600+D) 600-400 MG-UNIT PER TABLET    Take 1 tablet by mouth 2 (two) times daily.     CHOLECALCIFEROL (VITAMIN D) 1000 UNITS TABLET    Take 2,000 Units by mouth daily.    MULTIPLE VITAMIN (MULTIVITAMIN) TABLET    Take 1 tablet by mouth daily.     VITAMIN B-12 (CYANOCOBALAMIN) 500 MCG TABLET    Take 500 mcg by mouth daily.    Modified Medications   Modified Medication Previous Medication   ALPRAZOLAM (XANAX) 0.5 MG TABLET ALPRAZolam (XANAX) 0.5 MG tablet      Take 1 tablet (0.5 mg total) by mouth 3 (three) times daily as needed for sleep or anxiety.    Take 1 tablet (0.5 mg  total) by mouth 3 (three) times daily as needed for sleep or anxiety.   ATORVASTATIN (LIPITOR) 80 MG TABLET atorvastatin (LIPITOR) 80 MG tablet      Take 1 tablet (80 mg total) by mouth daily.    Take 1 tablet (80 mg total) by mouth daily.   CARISOPRODOL (SOMA) 350 MG TABLET carisoprodol (SOMA) 350 MG tablet      Take 1 tablet (350 mg total) by mouth 3 (three) times daily as needed for muscle spasms.    Take 1 tablet (350 mg total) by mouth 3 (three) times daily as needed for muscle spasms.   DILTIAZEM (CARDIZEM CD) 180 MG 24 HR CAPSULE diltiazem (CARDIZEM CD) 180 MG 24 hr capsule      Take 1 capsule (180 mg total) by mouth daily.    Take 1 capsule (180 mg total) by mouth daily.   ETODOLAC (LODINE) 400 MG TABLET etodolac (LODINE) 400 MG tablet      Take 1 tablet (400 mg total) by mouth 2 (two) times daily.    TAKE ONE TABLET BY MOUTH TWICE DAILY   FENOFIBRATE 160 MG TABLET fenofibrate 160 MG tablet      Take 1 tablet (160 mg total) by mouth daily.    TAKE ONE TABLET BY MOUTH ONE TIME DAILY   METOPROLOL SUCCINATE (TOPROL-XL) 50 MG 24 HR TABLET metoprolol succinate (TOPROL-XL) 50 MG 24 hr tablet      Take 1 tablet (50 mg total) by mouth daily. Take with or immediately following a meal.    TAKE ONE TABLET BY MOUTH ONE TIME DAILY   OMEPRAZOLE (PRILOSEC) 20 MG CAPSULE omeprazole (PRILOSEC) 20 MG capsule      Take 1 capsule (20 mg total) by mouth daily.    TAKE ONE CAPSULE BY MOUTH ONE TIME DAILY   RIVAROXABAN (XARELTO) 20 MG TABS XARELTO 20 MG TABS      Take 1 tablet (20 mg total) by mouth daily.    TAKE ONE TABLET BY MOUTH ONE TIME DAILY  Discontinued Medications   HYDROCODONE-ACETAMINOPHEN (VICODIN) 5-500 MG PER TABLET    1 every 6 hours as needed for pain- do not exceed 3 tablets per day

## 2012-06-15 NOTE — Patient Instructions (Addendum)
Today we updated your med list in our EPIC system...    Continue your current medications the same...    We refilled your meds per request...  Please return to our lab one morning soon for your FASTING blood work...    We will contact you w/ the results when avail...  Let's get on track w/ our diet & exercise program...    The goal is to lose 15-20 lbs!    This will help to keep you in the best shape poss (after all you are the caregiver for your family)...  Call for any questions...  Let's plan a check up in 6 months or so.Marland KitchenMarland Kitchen

## 2012-06-22 ENCOUNTER — Other Ambulatory Visit (INDEPENDENT_AMBULATORY_CARE_PROVIDER_SITE_OTHER): Payer: BC Managed Care – PPO

## 2012-06-22 ENCOUNTER — Telehealth: Payer: Self-pay | Admitting: Cardiology

## 2012-06-22 DIAGNOSIS — I10 Essential (primary) hypertension: Secondary | ICD-10-CM

## 2012-06-22 DIAGNOSIS — E291 Testicular hypofunction: Secondary | ICD-10-CM

## 2012-06-22 DIAGNOSIS — F341 Dysthymic disorder: Secondary | ICD-10-CM

## 2012-06-22 DIAGNOSIS — K5730 Diverticulosis of large intestine without perforation or abscess without bleeding: Secondary | ICD-10-CM

## 2012-06-22 DIAGNOSIS — E785 Hyperlipidemia, unspecified: Secondary | ICD-10-CM

## 2012-06-22 LAB — LIPID PANEL
HDL: 35.6 mg/dL — ABNORMAL LOW (ref >39.00)
Total CHOL/HDL Ratio: 5

## 2012-06-22 LAB — HEPATIC FUNCTION PANEL
ALT: 23 U/L (ref 0–53)
AST: 22 U/L (ref 0–37)
Albumin: 4 g/dL (ref 3.5–5.2)
Alkaline Phosphatase: 46 U/L (ref 39–117)
Bilirubin, Direct: 0.2 mg/dL (ref 0.0–0.3)
Total Protein: 6.8 g/dL (ref 6.0–8.3)

## 2012-06-22 LAB — BASIC METABOLIC PANEL
CO2: 28 mEq/L (ref 19–32)
Calcium: 9.6 mg/dL (ref 8.4–10.5)
Chloride: 104 mEq/L (ref 96–112)
Glucose, Bld: 104 mg/dL — ABNORMAL HIGH (ref 70–99)
Sodium: 139 mEq/L (ref 135–145)

## 2012-06-22 LAB — CBC WITH DIFFERENTIAL/PLATELET
Basophils Absolute: 0 10*3/uL (ref 0.0–0.1)
Eosinophils Absolute: 0.3 10*3/uL (ref 0.0–0.7)
Hemoglobin: 16.4 g/dL (ref 13.0–17.0)
Lymphocytes Relative: 29.3 % (ref 12.0–46.0)
MCHC: 34.1 g/dL (ref 30.0–36.0)
Monocytes Relative: 6.2 % (ref 3.0–12.0)
Neutro Abs: 4.3 10*3/uL (ref 1.4–7.7)
Neutrophils Relative %: 60.5 % (ref 43.0–77.0)
Platelets: 232 10*3/uL (ref 150.0–400.0)
RDW: 12.8 % (ref 11.5–14.6)

## 2012-06-22 LAB — TSH: TSH: 1.37 u[IU]/mL (ref 0.35–5.50)

## 2012-06-22 NOTE — Telephone Encounter (Signed)
New Problem:    Patient called in wanting to know if there were any samples of Rivaroxaban (XARELTO) 20 MG TABS available for him to have.  Please call back.

## 2012-06-22 NOTE — Telephone Encounter (Signed)
Pt was notified that samples (30 days) were left at the front desk for him.

## 2012-06-24 ENCOUNTER — Encounter: Payer: Self-pay | Admitting: Gastroenterology

## 2012-07-18 ENCOUNTER — Telehealth: Payer: Self-pay | Admitting: Cardiology

## 2012-07-18 NOTE — Telephone Encounter (Signed)
Samples left at the front desk.  Pt notified. 

## 2012-07-18 NOTE — Telephone Encounter (Signed)
Pt requesting samples of xarelto , pls call [phone]

## 2012-10-19 ENCOUNTER — Telehealth: Payer: Self-pay | Admitting: Cardiology

## 2012-10-19 NOTE — Telephone Encounter (Signed)
**Note De-Identified [place]ch Obfuscation** Pt is advised that samples of Xarelto are ready for him to pick up at the front desk, he verbalized understanding.

## 2012-10-19 NOTE — Telephone Encounter (Signed)
New problem     Pt wants to know if he can get samples of xarelto

## 2012-11-07 ENCOUNTER — Telehealth: Payer: Self-pay | Admitting: Cardiology

## 2012-11-07 NOTE — Telephone Encounter (Signed)
New Problem:    Patient called in wanting to know if there were any samples of Rivaroxaban (XARELTO) 20 MG TABS available for him to have.  Please call back.

## 2012-11-07 NOTE — Telephone Encounter (Signed)
Xarelto 20 mg # 3 lot # [id number] exo 10/16, advised patient

## 2012-11-22 ENCOUNTER — Telehealth: Payer: Self-pay | Admitting: Cardiology

## 2012-11-22 NOTE — Telephone Encounter (Signed)
New Problem  Pt is wanting to know can he get some samples of XARELTO 20 mg.

## 2012-11-22 NOTE — Telephone Encounter (Signed)
Pt is advised that samples are left at front desk and he may pick them up, he verbalized understanding.

## 2012-12-01 ENCOUNTER — Other Ambulatory Visit: Payer: Self-pay | Admitting: Pulmonary Disease

## 2012-12-01 MED ORDER — ALPRAZOLAM 0.5 MG PO TABS: ORAL_TABLET | ORAL | Status: DC

## 2012-12-05 ENCOUNTER — Telehealth: Payer: Self-pay | Admitting: Pulmonary Disease

## 2012-12-05 DIAGNOSIS — E785 Hyperlipidemia, unspecified: Secondary | ICD-10-CM

## 2012-12-05 NOTE — Telephone Encounter (Signed)
Called and lmom for the pt to make him aware that labs have been placed in the computer and he can come by prior to his appt for these.  Nothing further is needed.

## 2012-12-13 ENCOUNTER — Ambulatory Visit: Payer: BC Managed Care – PPO | Admitting: Pulmonary Disease

## 2012-12-14 ENCOUNTER — Telehealth: Payer: Self-pay | Admitting: Cardiology

## 2012-12-14 NOTE — Telephone Encounter (Signed)
New Problem:    Patient called in wanting to know if there were any samples of Rivaroxaban (XARELTO) 20 MG TABS available for him to have.  Please call back.

## 2012-12-14 NOTE — Telephone Encounter (Signed)
Spoke with patient to notify him that Xarelto 20 mg samples at front desk for him.  Patient verbalized understanding and gratitude.

## 2012-12-23 ENCOUNTER — Other Ambulatory Visit (INDEPENDENT_AMBULATORY_CARE_PROVIDER_SITE_OTHER): Payer: BC Managed Care – PPO

## 2012-12-23 ENCOUNTER — Ambulatory Visit (INDEPENDENT_AMBULATORY_CARE_PROVIDER_SITE_OTHER): Payer: BC Managed Care – PPO | Admitting: Pulmonary Disease

## 2012-12-23 ENCOUNTER — Encounter: Payer: Self-pay | Admitting: Pulmonary Disease

## 2012-12-23 ENCOUNTER — Ambulatory Visit (INDEPENDENT_AMBULATORY_CARE_PROVIDER_SITE_OTHER)
Admission: RE | Admit: 2012-12-23 | Discharge: 2012-12-23 | Disposition: A | Discharge status: Home / Self Care | Payer: BC Managed Care – PPO | Source: Ambulatory Visit | Attending: Pulmonary Disease | Admitting: Pulmonary Disease

## 2012-12-23 VITALS — BP 110/80 | HR 56 | Temp 96.9°F | Ht 78.0 in | Wt 285.4 lb

## 2012-12-23 DIAGNOSIS — E785 Hyperlipidemia, unspecified: Secondary | ICD-10-CM

## 2012-12-23 DIAGNOSIS — K5730 Diverticulosis of large intestine without perforation or abscess without bleeding: Secondary | ICD-10-CM

## 2012-12-23 DIAGNOSIS — I10 Essential (primary) hypertension: Secondary | ICD-10-CM

## 2012-12-23 DIAGNOSIS — F341 Dysthymic disorder: Secondary | ICD-10-CM

## 2012-12-23 DIAGNOSIS — I4892 Unspecified atrial flutter: Secondary | ICD-10-CM

## 2012-12-23 DIAGNOSIS — I2510 Atherosclerotic heart disease of native coronary artery without angina pectoris: Secondary | ICD-10-CM

## 2012-12-23 DIAGNOSIS — E669 Obesity, unspecified: Secondary | ICD-10-CM

## 2012-12-23 DIAGNOSIS — F17200 Nicotine dependence, unspecified, uncomplicated: Secondary | ICD-10-CM

## 2012-12-23 DIAGNOSIS — G5790 Unspecified mononeuropathy of unspecified lower limb: Secondary | ICD-10-CM

## 2012-12-23 DIAGNOSIS — K219 Gastro-esophageal reflux disease without esophagitis: Secondary | ICD-10-CM

## 2012-12-23 DIAGNOSIS — D126 Benign neoplasm of colon, unspecified: Secondary | ICD-10-CM

## 2012-12-23 DIAGNOSIS — N401 Enlarged prostate with lower urinary tract symptoms: Secondary | ICD-10-CM

## 2012-12-23 DIAGNOSIS — M545 Low back pain: Secondary | ICD-10-CM

## 2012-12-23 LAB — HEPATIC FUNCTION PANEL
ALT: 23 U/L (ref 0–53)
AST: 23 U/L (ref 0–37)
Alkaline Phosphatase: 48 U/L (ref 39–117)
Bilirubin, Direct: 0.1 mg/dL (ref 0.0–0.3)
Total Protein: 6.6 g/dL (ref 6.0–8.3)

## 2012-12-23 LAB — LIPID PANEL: Total CHOL/HDL Ratio: 5

## 2012-12-23 MED ORDER — DILTIAZEM HCL ER COATED BEADS 180 MG PO CP24: 180.0000 mg | ORAL_CAPSULE | Freq: Every day | ORAL | Status: DC

## 2012-12-23 MED ORDER — ALPRAZOLAM 0.5 MG PO TABS: ORAL_TABLET | ORAL | Status: DC

## 2012-12-23 MED ORDER — ETODOLAC 400 MG PO TABS: 400.0000 mg | ORAL_TABLET | Freq: Two times a day (BID) | ORAL | Status: DC

## 2012-12-23 MED ORDER — CARISOPRODOL 350 MG PO TABS: 350.0000 mg | ORAL_TABLET | Freq: Three times a day (TID) | ORAL | Status: DC | PRN

## 2012-12-23 MED ORDER — METOPROLOL SUCCINATE ER 50 MG PO TB24: 50.0000 mg | ORAL_TABLET | Freq: Every day | ORAL | Status: DC

## 2012-12-23 MED ORDER — ATORVASTATIN CALCIUM 80 MG PO TABS: 80.0000 mg | ORAL_TABLET | Freq: Every day | ORAL | Status: DC

## 2012-12-23 MED ORDER — TAMSULOSIN HCL 0.4 MG PO CAPS: 0.4000 mg | ORAL_CAPSULE | Freq: Every day | ORAL | Status: DC

## 2012-12-23 MED ORDER — OMEPRAZOLE 20 MG PO CPDR: 20.0000 mg | DELAYED_RELEASE_CAPSULE | Freq: Every day | ORAL | Status: DC

## 2012-12-23 MED ORDER — HYDROCODONE-ACETAMINOPHEN 5-325 MG PO TABS: 1.0000 | ORAL_TABLET | Freq: Three times a day (TID) | ORAL | Status: DC | PRN

## 2012-12-23 NOTE — Patient Instructions (Addendum)
Today we updated your med list in our EPIC system...    Continue your current medications the same...  Today we did your follow up CXR & FASTING blood work...    We will contact you w/ the results when available...   We will start one new med= TAMSULOSIN (Flomax) 0.4mg  one tab at bedtime for your prostate...  Call for any questions or if we can be of service in any way...  Let's plan a follow up visit in 45mo, sooner if needed for problems... '

## 2012-12-23 NOTE — Progress Notes (Signed)
Subjective:    Patient ID: Brendan [name], male    DOB: 12/16/1950, 62 y.o.   MRN: [id number]  HPI 62 y/o WM here for a follow up visit... he has mult med problems including continued smoking & underlying COPD;  HBP;  CAD & PAF followed by [name] on Coumadin via the CC;  Hyperlipidemia & Obesity;  LBP & Neuropathy w/ eval from [name];  Anxiety & some depression...  ~  February 11, 2011:  7mo ROV & he is here for med refills, not fasting today & he will ret for Fasting blood work soon... He tells me he is going to retire soon...    Still smoking ~1/2ppd w/ mild cough, AM sputum c/w chr bronchitis; he'll give the Chantix a try & advised to call quit line as well; known sm granulomas on prev CT, not seen on CXRs...    He is followed for CARDS by [name] & [name]> BP controlled on MetoprololER50 & Diltiazem180 w/ BP today 106/72; known CAD w/ prev PTCA & stent> denies angina, ch in SOB, edema; known PAFib/ Flutter w/ palpit on XERALTO & off Dig & Coumadin> notes from [facility] & [name] reviewed...    He has Hyperlipidemia on Crestor incr to 40 by Cards & Fenofib160> f/u FLp is pending...    He is overweight at 272# but he is actually down 12# this yr; encouraged to keep up the good work...    Mult other problems as outlined below >>  ~  September 29, 2011:  64-4mo ROV & [name] reports doing well w/o new complaints or concerns... He is now "semi-retired" & working from home... Needs refills for his Vicodin, Soma, Xanax...    He saw [name] 9/12 for Cards f/u of his CAD> he was referred to [name] due to atrial arrhythmia- ?AFlutter, pt is essent asymptomatic, on Cardizem & Xarelto...    We reviewed prob list, meds, xrays and labs> see below for updates >> CXR 4/13 showed normal heart size, clear lungs, NAD... LABS 5/13:  FLP- ok x LDL=120;  Chems- ok x BUN=110 Creat=1.1 K=5.8 BS=110;  CBC- wnl;  TSH=1.14;  PSA=0.51  ~  June 15, 2012:  7mo ROV & [name] is under considerable stress w/ Family issues- wife medical  issues (ocular pharyngeal MD- refuses feeding tube etc)& her daugh, grand daugh & one great grand all under one roof; he is coping the best he can; now smoking E-cig & hoping this will help; denies cough, sput, hemoptysis, dyspnea, etc; BP controlled on meds and he continues on Xarelto20 for his hxAFlutter per [name]; Lipids fair on max meds- he understands that any further improvement mut come from diet, exercise, wt reduction; weight is 284# (up 3# intervally);  See prob list below >>    We reviewed prob list, meds, xrays and labs> see below for updates >>  LABS 1/14:  FLP- TChol 185, TG 152, HDL 36, LDL 119 on Atorva80+Feno160; Chems- wnl w/ Creat=1.4;  CBC- wnl;  TSH=1.37;  PSA=0.42   ~  December 23, 2012:  64mo ROV & [name] has a number of somatic complaints revolving around his considerable stress levels;  Notes mild LTOS & some difficulty emptying his bladder- we discussed trial of Flomax to see if it helps his symptoms;  Wants Shingles vaccine- Rx written;  Needs colonoscopy but declines at this time "I'll wait til medicare" he says; notes arthitis pains and concerned for his memory (MMSE is 29/30) & decreased hearing (he has seen DrKraus in the  past)- we discussed all these issues...     He is still smoking  ~1/2ppd + Ecig & doesn't really want to quit; old CT Chest w/ several small granulomas- not seen on CXRs... He denies cough, sputum, hemoptysis, dyspnea...    BP is controlled on MetoprololER50 & CardizemCD180; BP= 110/80 & he denies CP, palpit, SOB, edema; he exercises w/ yard work, swimming, etc...    He has hx SVT, PAF/Flutter & [name] placed him on Xarelto- doing well, rhythm has remained regular w/o known breakthrough arrhythmias...    Lipids remain fair on max Rx w/ Atorva80 & Feno160- pt told any further improvement will require wt reduction (285# today- no change)... We reviewed prob list, meds, xrays and labs> see below for updates >> meds refilled per pt request... CXR 7/14 showed normal  heart size, atherosclerotic Ao, clear lungs, NAD... LABS 7/14:  FLP- not quite at goals & needs wt loss for further improvement;  LFTs wnl...           Problem List:  CIGARETTE SMOKER (ICD-305.1) - still smokes ~now trying E-cig... we have prev discussed smoking cessation many times but he is not motivated to quit... "I'm working on it" he says... ~  9/12:  He agreed to try Chantix & Rx written along w/ rec to call 1-800- QUIT NOW help line... ~  1/14:  Says he's switched to E-cig & hoping this will help; we discussed this & asked to wean... ~  7/14: still smoking  ~1/2ppd + Ecig & doesn't really want to quit; old CT Chest w/ several small granulomas- not seen on CXRs... He denies cough, sputum, hemoptysis, dyspnea  PULMONARY NODULE (ICD-518.89) - CXR's have been normal... CTChest 12/02 w/ stable small nodules that are likely granulomas...  ~  CXR 9/09 showed stable- clear lungs and no nodules seen on plain film. ~  CXR 6/11 showed COPD/ emphysema, no nodules, NAD.Marland Kitchen. ~  CXR 4/13 showed normal heart size, clear lungs, NAD.Marland Kitchen. ~  CXR 7/14 showed normal heart size, atherosclerotic Ao, clear lungs, NAD  HYPERTENSION (ICD-401.9) - controlled w/ TOPROL XL 50mg /d, & DILTIAZEM 180mg /d...  ~  9/12:  BP= 106/72 and doing well.. denies HA, fatigue, visual changes, CP, dizziness, syncope, dyspnea, edema, etc... ~  4/13:  BP= 110/72 & he remains largely asymptomatic... ~  1/14:  BP= 130/82 & he denies HA, visual sx, CP, palpit, SOB, edema, etc... ~  7/14: BP is controlled on MetoprololER50 & CardizemCD180; BP= 110/80 & he denies CP, palpit, SOB, edema; he exercises w/ yard work, swimming, etc.  CAD (ICD-414.00) - followed by [name] on ASA 81mg  daily but off now & on XARELTO20 per [name].. ~  cath 2001 w/ 3 vessel CAD- s/p PTCA/stent... ~  had NuclearStressTest 1/08= normal without ischemia & EF=60%... ~  He denies CP, change in SOB, edema, etc... exerc= yard, some walking prior to his back pain  prob. ~  He saw [name] 9/12 for Cards f/u of his CAD> he was referred to [name] due to atrial arrhythmia- ?AFlutter, pt is essent asymptomatic, on Cardizem & Xarelto... ~  He had f/u [name] 9/13> doing well, no palpit, continues on Xarelto; pt was asked to stop smoking, continue same meds, now in [place] & doing well... ~  EKG 9/13 showed SBrady, rate50, 1st degree AVB, otherw wnl...  CARDIAC ARRHYTHMIA >> Hx SVT, PAF, AFlutter >> [name] indicated that his [place] score wasn't high enough for Coumadin therapy prev, but it was started 3/10 w/ incr  PAF & ?TIA... ~  6/12:  He developed AFlutter & Cards decided to treat w/ XARELTO... Notes from [facility] & [name] are reviewed... ~  2DEcho 7/12 showed norm wall thickness, norm LVF w/ EF=55-65%, normal wall motion, Gr2 DD, mild dil LA&RA... ~  7/14: he feels he is stable on the Xarelto- no CP, palpit or known arrhythmias since starting this rx...  HYPERLIPIDEMIA (ICD-272.4) - now on ATORVASTATIN 80mg /d, FENOFIBRATE 160mg /d, + "diet" therapy... prev on Zetia but stopped by Cards & Simva80 stopped w/ new FDA guidelines. ~  FLP 11/08 showed TChol 188, TG 170, HDL 31, LDL 123... ~  FLP 9/09 showed TChol 224, TG 176, HDL 28, LDL 143... rec- incr Simvast 80/d & better diet... ~  FLP 12/09 showed TChol 186, TG 161, HDL 31, LDL 123... rec contin Simva80 + [id number] & get wt down. ~  FLP 6/10 showed TChol 185, TG 253, HDL 32, LDL 118... rec> needs better diet + exercise, refer to [facility]. ~  FLP at work 2/11 (on Simva80+Feno160) showed TChol 184, TG 304, HDL 33, LDL 90 ~  5/11: changed from Simva80 to CRESTOR 20mg /d... ~  FLP 6/11 on Cres20+Feno160 showed TChol 168, TG 185, HDL 32, LDL 99 ~  FLP 12/11 on Cres20+Feno160 showed TChol 184, TG 242, HDL 30, LDL 108... needs better diet, gt wt down. ~  Cardiology increased the Crestor to 40mg  in the interim... Pt didn't ret for f/u FLP. ~  FLP 5/13 on Cres?40?80+Feno160 showed TChol 185, TG 124, HDL 40, LDL 120...  Cres changed to [facility]... ~  FLP 1/14 on Atorva80+Feno160 showed TChol 185, TG 152, HDL 36, LDL 119  ~  FLP 7/14 on Atorva80+Feno160 showed TChol 178, TG 191, HDL33, LDL 107  OBESITY (ICD-278.00) - we discussed diet and exercise program in detail... ~  9/09:    his weight is down 12# today to 322#.Marland Kitchen.  ~  weight 12/09 = 319# ~  weight 3/10 = 315# ~  weight 6/10 = 309# ~  weight 6/11 = 282#... keep up the great work... ~  weight 12/11 = 284... what happened? ~  Weight 9/12 = 272#... Continue the wt reduction... ~  Weight 4/13 = 281# ~  Weight 1/14 = 284# ~  Weight 7/14 = 285#  GERD - on OMEPRAZOLE 20mg /d... DIVERTICULOSIS OF COLON (ICD-562.10), & COLONIC POLYPS (ICD-211.3) - last colonoscopy was 2/06 by DrStark showing divertics, and several 2-46mm polyps (hyperplastic)... f/u planned 2014...  BENIGN PROSTATIC HYPERTROPHY, WITH OBSTRUCTION (ICD-600.01),  Hx of TESTOSTERONE DEFICIENCY (ICD-257.2),  & ERECTILE DYSFUNCTION (ICD-302.72) - followed by Urology- [name] & [name]... ~  labs 12/11 showed Testosterone level = 420 (350-890) ~  7/14: he noted LTOS & would like to try Rx- start Flomax0.4mg ...  BACK PAIN, LUMBAR (ICD-724.2) - Ortho eval by DrGraves w/ PT recommended & improved... he uses ETODOLAC 400mg Bid Prn, SOMA 350mg Tid Prn, & VICODIN Prn... ~  LumbarXRay 3/10 showed mild scoliosis, DDD w/ osteophytes & facet degen changes...   PERIPHERAL NEUROPATHY, LOWER EXTREMITY (ICD-355.8) - eval 3/10 by Autumn Patty w/ prob peripheral neuropathy found ?CMT ("I'm clumsey, I fall")... labs 3/10 showed borderline B 12 level = 273 by [name] & Rx'd w/ Vit B 12 544mcg/d by mouth. ~  labs here 6/10 showed B 12 level = 584 ~  f/u Neuro eval 10/11 by [name] for Gait abn, Hx TIA, ?CMT neuropathy, LE weakness, Tremor, FamHx Alz dis> wanted to do EMG/NCV & sleep study but pt declined.  ANXIETY DEPRESSION (ICD-300.4) -  on ALPRAZOLAM 0.5mg  Tid Prn... under alot of stress> work, home life, & wife w/ some  variant of musc dystrophy...  12/09 w/ confusional episode, couldn't make decision, eval [facility] w/ neg CT Brain & neg exam, subseq MRI w/ min atrophy/ sm vessel dis, NAD; Rx ASA 81mg /d.  ? of B12 DEFICIENCY (ICD-266.2) & VITAMIN D DEFICIENCY (ICD-268.9) - as noted Vit B12 was borderline per [name] 3/10 (273) and he was started on oral B12 supplement ~570mcg/d... repeat B12 level 6/10 = 584, improved... Vit D level = 18 6/10 & pt rec to start Vit D supplement w/ repeat Vit D level = 27 in Mar11... OK to continue 1000 u daily... ~  labs 6/11:  Vit B12= 606;  Vit D= 35... continue supplements.   Past Surgical History  Procedure Laterality Date  . Tonsillectomy    . Clavicle surgery    . Arm fracture surgery      Outpatient Encounter Prescriptions as of 12/23/2012  Medication Sig Dispense Refill  . ALPRAZolam (XANAX) 0.5 MG tablet Take 1/2 to 1 tablet by mouth three times daily as needed for anxiety  90 tablet  5  . atorvastatin (LIPITOR) 80 MG tablet Take 1 tablet (80 mg total) by mouth daily.  30 tablet  11  . Calcium Carbonate-Vitamin D (CALCIUM 600+D) 600-400 MG-UNIT per tablet Take 1 tablet by mouth 2 (two) times daily.        . carisoprodol (SOMA) 350 MG tablet Take 1 tablet (350 mg total) by mouth 3 (three) times daily as needed for muscle spasms.  90 tablet  5  . cholecalciferol (VITAMIN D) 1000 UNITS tablet Take 2,000 Units by mouth daily.       Marland Kitchen diltiazem (CARDIZEM CD) 180 MG 24 hr capsule Take 1 capsule (180 mg total) by mouth daily.  30 capsule  11  . etodolac (LODINE) 400 MG tablet Take 1 tablet (400 mg total) by mouth 2 (two) times daily.  60 tablet  11  . fenofibrate 160 MG tablet Take 1 tablet (160 mg total) by mouth daily.  30 tablet  11  . HYDROcodone-acetaminophen (NORCO/VICODIN) 5-325 MG per tablet Take 1 tablet by mouth 3 (three) times daily as needed for pain.  90 tablet  5  . metoprolol succinate (TOPROL-XL) 50 MG 24 hr tablet Take 1 tablet (50 mg total) by mouth daily. Take  with or immediately following a meal.  30 tablet  11  . Multiple Vitamin (MULTIVITAMIN) tablet Take 1 tablet by mouth daily.        Marland Kitchen omeprazole (PRILOSEC) 20 MG capsule Take 1 capsule (20 mg total) by mouth daily.  30 capsule  11  . Rivaroxaban (XARELTO) 20 MG TABS Take 1 tablet (20 mg total) by mouth daily.  30 tablet  5  . vitamin B-12 (CYANOCOBALAMIN) 500 MCG tablet Take 500 mcg by mouth daily.         No facility-administered encounter medications on file as of 12/23/2012.    Allergies  Allergen Reactions  . Hydrocod Polst-Cpm Polst Er     REACTION: hives    Current Medications, Allergies, Past Medical History, Past Surgical History, Family History, and Social History were reviewed in [employer] record.    Review of Systems        See HPI - all other systems neg except as noted...  The patient complains of dyspnea on exertion, muscle weakness, and difficulty walking.  The patient denies anorexia, fever, weight loss, weight  gain, vision loss, decreased hearing, hoarseness, chest pain, syncope, peripheral edema, prolonged cough, headaches, hemoptysis, abdominal pain, melena, hematochezia, severe indigestion/heartburn, hematuria, incontinence, suspicious skin lesions, transient blindness, depression, unusual weight change, abnormal bleeding, enlarged lymph nodes, and angioedema.     Objective:   Physical Exam    WD, Obese, 62 y/o WM in NAD... he is 6'6" tall and weighs 272lbs GENERAL:  Alert & oriented; pleasant & cooperative... HEENT:  Vivian/AT, EOM-wnl, PERRLA, EACs-clear, TMs-wnl, NOSE-clear, THROAT-clear & wnl. NECK:  Supple w/ fairROM; no JVD; normal carotid impulses w/o bruits; no thyromegaly or nodules palpated; no lymphadenopathy. CHEST:  Clear to P & A; without wheezes/ rales/ or rhonchi. HEART:  Regular Rhythm; gr 1/6 SEM without rubs or gallops heard... ABDOMEN:  Soft & nontender; normal bowel sounds; no organomegaly or masses detected. EXT: without  deformities, mod arthritic changes; no varicose veins/ +venous insuffic/ tr edema.  NEURO:  CN's intact;  no focal neuro deficits x sl decr sensation in LE's & wide based gait... DERM:  No lesions noted; no rash etc...  RADIOLOGY DATA:  Reviewed in the EPIC EMR & discussed w/ the patient...  LABORATORY DATA:  Reviewed in the EPIC EMR & discussed w/ the patient...   Assessment & Plan:    COPD/ Pulm Nodules on CT/ Cig Smoker>  Must quit all smoking, CXR/ CT reviewed likely granulomas, NAD...  HBP>  Controlled on BBlocker, CCB;  Continue same meds + diet & exercise...  CAD>  Followed by [name] & stable; last Myoview 2008 was neg; he is active yard etc...  Arrhythmias>  Followed by [name] w/ hx SVT, PAF, now AFlutter; he was switched from Coumadin to [facility]...  Hyperlipidemia>  On max meds w/ Atorva80 + Feno160- further improvement requires wt reduction!!!  Obesity>  No change at 285#  GI> GERD, Divertics, Polyps>  Followed by DrStark & repeat colon due 2014...  GU> BPH, Low-T, ED>  Followed by [name] in past; he is c/o LTOS & we will try Flomax0.4  DJD, LBP>  On Etodolac, Soma, Vicodin for his chronic pain...  NEUROPATHY>  Followed by Autumn Patty w/ gait abn, TIA, ?CMT Neuropathy...  Hx Borderline B12 & low Vit D level>  He takes oral supplements...  Anxiety/ Depression>  On Alprazolam prn & he is hoping that retirement will help...   Patient's Medications  New Prescriptions   TAMSULOSIN (FLOMAX) 0.4 MG CAPS    Take 1 capsule (0.4 mg total) by mouth daily after supper.  Previous Medications   CALCIUM CARBONATE-VITAMIN D (CALCIUM 600+D) 600-400 MG-UNIT PER TABLET    Take 1 tablet by mouth 2 (two) times daily.     CHOLECALCIFEROL (VITAMIN D) 1000 UNITS TABLET    Take 2,000 Units by mouth daily.    FENOFIBRATE 160 MG TABLET    Take 1 tablet (160 mg total) by mouth daily.   MULTIPLE VITAMIN (MULTIVITAMIN) TABLET    Take 1 tablet by mouth daily.     RIVAROXABAN (XARELTO) 20 MG TABS     Take 1 tablet (20 mg total) by mouth daily.   VITAMIN B-12 (CYANOCOBALAMIN) 500 MCG TABLET    Take 500 mcg by mouth daily.    Modified Medications   Modified Medication Previous Medication   ALPRAZOLAM (XANAX) 0.5 MG TABLET ALPRAZolam (XANAX) 0.5 MG tablet      Take 1/2 to 1 tablet by mouth three times daily as needed for anxiety    Take 1/2 to 1 tablet by mouth three times daily as needed  for anxiety   ATORVASTATIN (LIPITOR) 80 MG TABLET atorvastatin (LIPITOR) 80 MG tablet      Take 1 tablet (80 mg total) by mouth daily.    Take 1 tablet (80 mg total) by mouth daily.   CARISOPRODOL (SOMA) 350 MG TABLET carisoprodol (SOMA) 350 MG tablet      Take 1 tablet (350 mg total) by mouth 3 (three) times daily as needed for muscle spasms.    Take 1 tablet (350 mg total) by mouth 3 (three) times daily as needed for muscle spasms.   DILTIAZEM (CARDIZEM CD) 180 MG 24 HR CAPSULE diltiazem (CARDIZEM CD) 180 MG 24 hr capsule      Take 1 capsule (180 mg total) by mouth daily.    Take 1 capsule (180 mg total) by mouth daily.   ETODOLAC (LODINE) 400 MG TABLET etodolac (LODINE) 400 MG tablet      Take 1 tablet (400 mg total) by mouth 2 (two) times daily.    Take 1 tablet (400 mg total) by mouth 2 (two) times daily.   HYDROCODONE-ACETAMINOPHEN (NORCO/VICODIN) 5-325 MG PER TABLET HYDROcodone-acetaminophen (NORCO/VICODIN) 5-325 MG per tablet      Take 1 tablet by mouth 3 (three) times daily as needed for pain.    Take 1 tablet by mouth 3 (three) times daily as needed for pain.   METOPROLOL SUCCINATE (TOPROL-XL) 50 MG 24 HR TABLET metoprolol succinate (TOPROL-XL) 50 MG 24 hr tablet      Take 1 tablet (50 mg total) by mouth daily. Take with or immediately following a meal.    Take 1 tablet (50 mg total) by mouth daily. Take with or immediately following a meal.   OMEPRAZOLE (PRILOSEC) 20 MG CAPSULE omeprazole (PRILOSEC) 20 MG capsule      Take 1 capsule (20 mg total) by mouth daily.    Take 1 capsule (20 mg total) by  mouth daily.  Discontinued Medications   No medications on file

## 2013-01-12 NOTE — Telephone Encounter (Signed)
New Prob     Pt requesting samples of XARELTO.

## 2013-01-12 NOTE — Telephone Encounter (Signed)
**Note De-Identified Brendan [name] Obfuscation** Pt advised that samples have been left at front desk and that he may pick up, he verbalized understanding.

## 2013-02-07 ENCOUNTER — Telehealth: Payer: Self-pay | Admitting: Cardiology

## 2013-02-07 ENCOUNTER — Telehealth: Payer: Self-pay | Admitting: Pulmonary Disease

## 2013-02-07 NOTE — Telephone Encounter (Signed)
**Note De-Identified Brendan [name] Obfuscation** Pt is advised that he can pick Xarelto samples up at front office, he verbalized understanding.

## 2013-02-07 NOTE — Telephone Encounter (Signed)
Spoke with pt and advised him we did not have any samples at this time. He did not need RX sent. Nothing further needed

## 2013-02-07 NOTE — Telephone Encounter (Signed)
New Prob  Pt is requesting samples of Xarelto

## 2013-02-13 ENCOUNTER — Encounter: Payer: Self-pay | Admitting: Cardiology

## 2013-02-13 DIAGNOSIS — R9430 Abnormal result of cardiovascular function study, unspecified: Secondary | ICD-10-CM | POA: Insufficient documentation

## 2013-02-15 ENCOUNTER — Encounter: Payer: Self-pay | Admitting: Cardiology

## 2013-02-15 ENCOUNTER — Ambulatory Visit (INDEPENDENT_AMBULATORY_CARE_PROVIDER_SITE_OTHER): Payer: BC Managed Care – PPO | Admitting: Cardiology

## 2013-02-15 VITALS — BP 132/62 | HR 57 | Ht 78.0 in | Wt 283.0 lb

## 2013-02-15 DIAGNOSIS — I2510 Atherosclerotic heart disease of native coronary artery without angina pectoris: Secondary | ICD-10-CM

## 2013-02-15 DIAGNOSIS — F17200 Nicotine dependence, unspecified, uncomplicated: Secondary | ICD-10-CM

## 2013-02-15 DIAGNOSIS — I4892 Unspecified atrial flutter: Secondary | ICD-10-CM

## 2013-02-15 DIAGNOSIS — Z9229 Personal history of other drug therapy: Secondary | ICD-10-CM

## 2013-02-15 DIAGNOSIS — E785 Hyperlipidemia, unspecified: Secondary | ICD-10-CM

## 2013-02-15 DIAGNOSIS — Z7901 Long term (current) use of anticoagulants: Secondary | ICD-10-CM

## 2013-02-15 DIAGNOSIS — I10 Essential (primary) hypertension: Secondary | ICD-10-CM

## 2013-02-15 NOTE — Assessment & Plan Note (Signed)
Coronary disease is stable. He he received a stent in 2001. Nuclear exercise test June, 2010 revealed no ischemia. He's not having symptoms. No further workup is needed.

## 2013-02-15 NOTE — Assessment & Plan Note (Signed)
He will continue on Xarelto.

## 2013-02-15 NOTE — Assessment & Plan Note (Signed)
I have counseled him to stop smoking. I hope that his wife is able to begin trying to stop so that they can stop together.

## 2013-02-15 NOTE — Progress Notes (Signed)
HPI  Patient is seen to followup coronary disease and a history of atrial flutter. He is actually doing well. He's not having any chest pain. He has not had any significant palpitations. Unfortunately he continues to smoke approximately 10 cigarettes per day. He is hoping that his wife will consider quitting soon so that they can try together.  Allergies  Allergen Reactions  . Hydrocod Polst-Cpm Polst Er     REACTION: hives    Current Outpatient Prescriptions  Medication Sig Dispense Refill  . ALPRAZolam (XANAX) 0.5 MG tablet Take 1/2 to 1 tablet by mouth three times daily as needed for anxiety  90 tablet  5  . atorvastatin (LIPITOR) 80 MG tablet Take 1 tablet (80 mg total) by mouth daily.  30 tablet  11  . Calcium Carbonate-Vitamin D (CALCIUM 600+D) 600-400 MG-UNIT per tablet Take 1 tablet by mouth 2 (two) times daily.        . carisoprodol (SOMA) 350 MG tablet Take 1 tablet (350 mg total) by mouth 3 (three) times daily as needed for muscle spasms.  90 tablet  5  . cholecalciferol (VITAMIN D) 1000 UNITS tablet Take 2,000 Units by mouth daily.       Marland Kitchen diltiazem (CARDIZEM CD) 180 MG 24 hr capsule Take 1 capsule (180 mg total) by mouth daily.  30 capsule  11  . etodolac (LODINE) 400 MG tablet Take 1 tablet (400 mg total) by mouth 2 (two) times daily.  60 tablet  11  . fenofibrate 160 MG tablet Take 1 tablet (160 mg total) by mouth daily.  30 tablet  11  . HYDROcodone-acetaminophen (NORCO/VICODIN) 5-325 MG per tablet Take 1 tablet by mouth 3 (three) times daily as needed for pain.  90 tablet  5  . metoprolol succinate (TOPROL-XL) 50 MG 24 hr tablet Take 1 tablet (50 mg total) by mouth daily. Take with or immediately following a meal.  30 tablet  11  . Multiple Vitamin (MULTIVITAMIN) tablet Take 1 tablet by mouth daily.        Marland Kitchen omeprazole (PRILOSEC) 20 MG capsule Take 1 capsule (20 mg total) by mouth daily.  30 capsule  11  . Rivaroxaban (XARELTO) 20 MG TABS Take 1 tablet (20 mg total) by  mouth daily.  30 tablet  5  . tamsulosin (FLOMAX) 0.4 MG CAPS Take 1 capsule (0.4 mg total) by mouth daily after supper.  30 capsule  11  . vitamin B-12 (CYANOCOBALAMIN) 500 MCG tablet Take 500 mcg by mouth daily.         No current facility-administered medications for this visit.    History   Social History  . Marital Status: Married    Spouse Name: N/A    Number of Children: N/A  . Years of Education: N/A   Occupational History  . Not on file.   Social History Main Topics  . Smoking status: Current Every Day Smoker -- 0.30 packs/day  . Smokeless tobacco: Not on file     Comment: smokes about 3  daily  . Alcohol Use: 2.5 oz/week    5 drink(s) per week  . Drug Use: Not on file  . Sexual Activity: Not on file   Other Topics Concern  . Not on file   Social History Narrative  . No narrative on file    Family History  Problem Relation Age of Onset  . Stroke Mother   . Alzheimer's disease Mother   . Heart failure Father   .  Stroke Father   . Dementia    . Clotting disorder      Past Medical History  Diagnosis Date  . Pulmonary nodule   . HTN (hypertension)   . CAD (coronary artery disease)     stent, 2001 / nuclear, June, 2010, no ischemia  . PAF (paroxysmal atrial fibrillation)   . Dyslipidemia   . Obesity   . Diverticulosis of colon   . History of colonic polyps   . BPH (benign prostatic hypertrophy)   . Testosterone deficiency   . ED (erectile dysfunction)   . Lumbar back pain   . Peripheral neuropathy     lower extremities  . Anxiety and depression   . Vitamin D deficiency   . Blurred vision     episode with confusion, evaluated Dr. Sandria Manly  . Ejection fraction   . B12 deficiency   . Scalp lesion     November, 2011  . Warfarin anticoagulation   . Atrial flutter     New diagnosis November 10, 2010, rate controlled    Past Surgical History  Procedure Laterality Date  . Tonsillectomy    . Clavicle surgery    . Arm fracture surgery      Patient  Active Problem List   Diagnosis Date Noted  . Dyslipidemia     Priority: High  . CIGARETTE SMOKER 07/23/2007    Priority: High  . Ejection fraction   . HX: anticoagulation 02/14/2012  . GERD (gastroesophageal reflux disease) 02/11/2011  . Atrial flutter   . HTN (hypertension)   . CAD (coronary artery disease)   . Obesity   . B12 deficiency   . PAF (paroxysmal atrial fibrillation)   . VITAMIN D DEFICIENCY 05/20/2009  . PERIPHERAL NEUROPATHY, LOWER EXTREMITY 09/10/2008  . SKIN RASH 09/10/2008  . BACK PAIN, LUMBAR 08/03/2008  . ERECTILE DYSFUNCTION 02/09/2008  . BENIGN PROSTATIC HYPERTROPHY, WITH OBSTRUCTION 02/09/2008  . COLONIC POLYPS 07/23/2007  . TESTOSTERONE DEFICIENCY 07/23/2007  . PULMONARY NODULE 07/23/2007  . DIVERTICULOSIS OF COLON 07/23/2007  . ANXIETY DEPRESSION 07/08/2007    ROS   Patient denies fever, chills, headache, sweats, rash, change in vision, change in hearing, chest pain, cough, nausea vomiting, urinary symptoms. All other systems are reviewed and are negative.  PHYSICAL EXAM  Patient is oriented to person time and place. Affect is normal. There is no jugulovenous distention. Lungs are clear. Respiratory effort is nonlabored. Cardiac exam her vitals S1 and S2. There no clicks or significant murmurs. The abdomen is soft. There is no peripheral edema.  Filed Vitals:   02/15/13 0938  BP: 132/62  Pulse: 57  Height: 6\' 6"  (1.981 m)  Weight: 283 lb (128.368 kg)   EKG is done today and reviewed by me. There is sinus rhythm with mild sinus bradycardia. There are no acute changes. There is no change from the past.  ASSESSMENT & PLAN

## 2013-02-15 NOTE — Assessment & Plan Note (Signed)
Today he has sinus rhythm. When he has atrial flutter his rate is controlled. He is continued on Xarelto.

## 2013-02-15 NOTE — Patient Instructions (Addendum)
Your physician recommends that you continue on your current medications as directed. Please refer to the Current Medication list given to you today.  Your physician wants you to follow-up in: 1 year. You will receive a reminder letter in the mail two months in advance. If you don't receive a letter, please call our office to schedule the follow-up appointment.  

## 2013-02-15 NOTE — Assessment & Plan Note (Signed)
Patient is receiving optimal treatment for his lipids. No change in therapy.

## 2013-02-15 NOTE — Assessment & Plan Note (Signed)
Blood pressures control. No change in therapy. 

## 2013-03-09 ENCOUNTER — Telehealth: Payer: Self-pay | Admitting: Cardiology

## 2013-03-09 NOTE — Telephone Encounter (Signed)
New problem   Pt need sample of Xlerato

## 2013-03-09 NOTE — Telephone Encounter (Signed)
lmovm samples of Xarelto are at the front desk Mylo Red RN

## 2013-04-04 ENCOUNTER — Telehealth: Payer: Self-pay | Admitting: *Deleted

## 2013-04-04 NOTE — Telephone Encounter (Signed)
Provided xarelto 20 mg samples 4 weeks worth

## 2013-04-13 ENCOUNTER — Other Ambulatory Visit: Payer: Self-pay

## 2013-05-01 ENCOUNTER — Telehealth: Payer: Self-pay | Admitting: Pulmonary Disease

## 2013-05-01 MED ORDER — HYDROCODONE-ACETAMINOPHEN 5-325 MG PO TABS: 1.0000 | ORAL_TABLET | Freq: Three times a day (TID) | ORAL | Status: DC | PRN

## 2013-05-01 NOTE — Telephone Encounter (Signed)
Norco last refilled 12/23/12 #90 x 5 refills. Please advise SN thanks

## 2013-05-01 NOTE — Telephone Encounter (Signed)
Called the pharmacy and the last fill was for 9/17 for the vicodin.  rx has been printed out and placed on SN cart to be signed.

## 2013-05-02 ENCOUNTER — Encounter: Payer: Self-pay | Admitting: *Deleted

## 2013-05-02 NOTE — Telephone Encounter (Signed)
Pt aware. Nothing further needed 

## 2013-05-02 NOTE — Telephone Encounter (Signed)
rx has been signed by SN and i have left this up front for the pt to come by and pick up.

## 2013-05-05 ENCOUNTER — Telehealth: Payer: Self-pay | Admitting: Pulmonary Disease

## 2013-05-05 ENCOUNTER — Telehealth: Payer: Self-pay

## 2013-05-05 NOTE — Telephone Encounter (Signed)
Patient called in for samples of xarelto placed at front desk

## 2013-05-05 NOTE — Telephone Encounter (Signed)
Called spoke with patient who is requesting SN's recs for a physician specializing in Muscular Dystrophy for his wife Pt stated that they live in [facility] and would like someone there or here in [place] Pt aware SN out of the office today and is okay with a call back Monday 12.1.14  Dr Kriste Basque please advise, thank you.

## 2013-05-08 NOTE — Telephone Encounter (Signed)
[email or web address].Marizol Borror, CMA  

## 2013-05-08 NOTE — Telephone Encounter (Signed)
I called and made pt aware. Nothing further needed 

## 2013-05-08 NOTE — Telephone Encounter (Signed)
Pt is calling back [phone]

## 2013-05-08 NOTE — Telephone Encounter (Signed)
Per SN---  [facility] a new specialist that they could get an appt with .

## 2013-05-24 ENCOUNTER — Telehealth: Payer: Self-pay | Admitting: Pulmonary Disease

## 2013-05-24 ENCOUNTER — Telehealth: Payer: Self-pay | Admitting: *Deleted

## 2013-05-24 NOTE — Telephone Encounter (Signed)
Returning call can be reached at [phone].Brendan [name] ° °

## 2013-05-24 NOTE — Telephone Encounter (Signed)
i spoke with pt. He is scheduled to come in and see TP Friday for an eval. Nothing further needed

## 2013-05-24 NOTE — Telephone Encounter (Signed)
Patient requests xarelto samples. He is aware that they will be left at the front desk for pick up. 

## 2013-05-24 NOTE — Telephone Encounter (Signed)
lmomtcb x1 

## 2013-05-26 ENCOUNTER — Ambulatory Visit: Payer: BC Managed Care – PPO | Admitting: Adult Health

## 2013-06-20 ENCOUNTER — Other Ambulatory Visit: Payer: Self-pay

## 2013-06-20 ENCOUNTER — Telehealth: Payer: Self-pay | Admitting: Pulmonary Disease

## 2013-06-20 NOTE — Telephone Encounter (Signed)
Called and spoke with pt. I advised pt he can wait until he is seen in the office or he can go to the pharmacy. It was up to him. Nothing further needed

## 2013-06-20 NOTE — Telephone Encounter (Signed)
Patient called for xarelto samples and I placed them at the front

## 2013-06-26 ENCOUNTER — Telehealth: Payer: Self-pay | Admitting: Pulmonary Disease

## 2013-06-26 ENCOUNTER — Encounter: Payer: Self-pay | Admitting: Pulmonary Disease

## 2013-06-26 ENCOUNTER — Ambulatory Visit (INDEPENDENT_AMBULATORY_CARE_PROVIDER_SITE_OTHER): Payer: BC Managed Care – PPO | Admitting: Pulmonary Disease

## 2013-06-26 ENCOUNTER — Other Ambulatory Visit: Payer: Self-pay | Admitting: Pulmonary Disease

## 2013-06-26 VITALS — BP 130/86 | HR 64 | Temp 98.0°F | Ht 78.0 in | Wt 288.6 lb

## 2013-06-26 DIAGNOSIS — K219 Gastro-esophageal reflux disease without esophagitis: Secondary | ICD-10-CM

## 2013-06-26 DIAGNOSIS — M545 Low back pain, unspecified: Secondary | ICD-10-CM

## 2013-06-26 DIAGNOSIS — E538 Deficiency of other specified B group vitamins: Secondary | ICD-10-CM

## 2013-06-26 DIAGNOSIS — F341 Dysthymic disorder: Secondary | ICD-10-CM

## 2013-06-26 DIAGNOSIS — I4892 Unspecified atrial flutter: Secondary | ICD-10-CM

## 2013-06-26 DIAGNOSIS — G5790 Unspecified mononeuropathy of unspecified lower limb: Secondary | ICD-10-CM

## 2013-06-26 DIAGNOSIS — I10 Essential (primary) hypertension: Secondary | ICD-10-CM

## 2013-06-26 DIAGNOSIS — E785 Hyperlipidemia, unspecified: Secondary | ICD-10-CM

## 2013-06-26 DIAGNOSIS — Z9229 Personal history of other drug therapy: Secondary | ICD-10-CM

## 2013-06-26 DIAGNOSIS — N401 Enlarged prostate with lower urinary tract symptoms: Secondary | ICD-10-CM

## 2013-06-26 DIAGNOSIS — K5730 Diverticulosis of large intestine without perforation or abscess without bleeding: Secondary | ICD-10-CM

## 2013-06-26 DIAGNOSIS — I2510 Atherosclerotic heart disease of native coronary artery without angina pectoris: Secondary | ICD-10-CM

## 2013-06-26 DIAGNOSIS — N138 Other obstructive and reflux uropathy: Secondary | ICD-10-CM

## 2013-06-26 DIAGNOSIS — F17200 Nicotine dependence, unspecified, uncomplicated: Secondary | ICD-10-CM

## 2013-06-26 DIAGNOSIS — E291 Testicular hypofunction: Secondary | ICD-10-CM

## 2013-06-26 DIAGNOSIS — E559 Vitamin D deficiency, unspecified: Secondary | ICD-10-CM

## 2013-06-26 DIAGNOSIS — F528 Other sexual dysfunction not due to a substance or known physiological condition: Secondary | ICD-10-CM

## 2013-06-26 DIAGNOSIS — I480 Paroxysmal atrial fibrillation: Secondary | ICD-10-CM

## 2013-06-26 DIAGNOSIS — D126 Benign neoplasm of colon, unspecified: Secondary | ICD-10-CM

## 2013-06-26 DIAGNOSIS — E669 Obesity, unspecified: Secondary | ICD-10-CM

## 2013-06-26 MED ORDER — HYDROCODONE-ACETAMINOPHEN 5-325 MG PO TABS: 1.0000 | ORAL_TABLET | Freq: Three times a day (TID) | ORAL | Status: DC | PRN

## 2013-06-26 NOTE — Patient Instructions (Signed)
Today we updated your med list in our EPIC system...    Continue your current medications the same...    We refilled the meds you requested...  Let's get on track w/ our diet & exercise program...  Let me know if we can be of assistance in your disability application...  Call for any questions...  Let's plan a follow primary care appt in 2mo, sooner if needed for problems.Marland KitchenMarland Kitchen

## 2013-06-26 NOTE — Telephone Encounter (Signed)
Spoke with patient-states he forgot to get refills for his meds today-refill encounter exists for Soma, Xanax, and Fenofibrate. Pt will also need refills for Lipitor, Diltiazem, Etodolac, Metoprolol, Prilosec, Xarelto, and Flomax. Please advise if okay to send 30 day and how many refills to give as patient will be getting a new PCP. Thanks.

## 2013-06-26 NOTE — Progress Notes (Signed)
Subjective:    Patient ID: Brendan [name], male    DOB: February 18, 1951, 63 y.o.   MRN: [id number]  HPI 63 y/o WM here for a follow up visit... he has mult med problems including continued smoking & underlying COPD;  HBP;  CAD & PAF followed by Benay Spice on Coumadin via the CC;  Hyperlipidemia & Obesity;  LBP & Neuropathy w/ eval from [name];  Anxiety & some depression...  ~  September 29, 2011:  7-17moROV & TOctavia Brucknerreports doing well w/o new complaints or concerns... He is now "semi-retired" & working from home... Needs refills for his Vicodin, Soma, Xanax...    He saw DBenay Spice9/12 for Cards f/u of his CAD> he was referred to [name] due to atrial arrhythmia- ?AFlutter, pt is essent asymptomatic, on Cardizem & Xarelto...    We reviewed prob list, meds, xrays and labs> see below for updates >> CXR 4/13 showed normal heart size, clear lungs, NAD... LABS 5/13:  FLP- ok x LDL=120;  Chems- ok x BUN=110 Creat=1.1 K=5.8 BS=110;  CBC- wnl;  TSH=1.14;  PSA=0.51  ~  June 15, 2012:  963moOV & TiOctavia Bruckners under considerable stress w/ Family issues- wife medical issues (ocular pharyngeal MD- refuses feeding tube etc)& her daugh, grand daugh & one great grand all under one roof; he is coping the best he can; now smoking E-cig & hoping this will help; denies cough, sput, hemoptysis, dyspnea, etc; BP controlled on meds and he continues on Xarelto20 for his hxAFlutter per [name]; Lipids fair on max meds- he understands that any further improvement mut come from diet, exercise, wt reduction; weight is 284# (up 3# intervally);  See prob list below >>    We reviewed prob list, meds, xrays and labs> see below for updates >>  LABS 1/14:  FLP- TChol 185, TG 152, HDL 36, LDL 119 on Atorva80+Feno160; Chems- wnl w/ Creat=1.4;  CBC- wnl;  TSH=1.37;  PSA=0.42   ~  December 23, 2012:  66m64moV & TimOctavia Bruckners a number of somatic complaints revolving around his considerable stress levels;  Notes mild LTOS & some difficulty emptying his bladder- we  discussed trial of Flomax to see if it helps his symptoms;  Wants Shingles vaccine- Rx written;  Needs colonoscopy but declines at this time "I'll wait til medicare" he says; notes arthitis pains and concerned for his memory (MMSE is 29/30) & decreased hearing (he has seen DrKraus in the past)- we discussed all these issues...     He is still smoking  ~1/2ppd + Ecig & doesn't really want to quit; old CT Chest w/ several small granulomas- not seen on CXRs... He denies cough, sputum, hemoptysis, dyspnea...    BP is controlled on MetoprololER50 & CardizemCD180; BP= 110/80 & he denies CP, palpit, SOB, edema; he exercises w/ yard work, swimming, etc...    He has hx SVT, PAF/Flutter & [name] placed him on Xarelto- doing well, rhythm has remained regular w/o known breakthrough arrhythmias...    Lipids remain fair on max Rx w/ Atorva80 & Feno160- pt told any further improvement will require wt reduction (285# today- no change)... We reviewed prob list, meds, xrays and labs> see below for updates >> meds refilled per pt request... CXR 7/14 showed normal heart size, atherosclerotic Ao, clear lungs, NAD... LABS 7/14:  FLP- not quite at goals & needs wt loss for further improvement;  LFTs wnl...  ~  June 26, 2013:  661mo45mo & Brendan [name] a list of questions/  problem areas to discuss today>     He had one transient episode of double vision- lasted 30 sec, resolved spont, no speech difficulty, no weakness, etc; states he had persist diplopia even w/one eye closed; he is rec to take ASA2m/d; no recurrence...    He continues to smoke and again asked to STOP; discussed nicotine replacement vs Chantix, etc...    He saw [name] for Cards f/u 9/14> hx CAD & AFlutter; on MetopER50, DiltiazemCD180, Xarelto20; doing well w/o palp[it, no change in meds...    Left olec bursitis w/ fluid present; we discussed wearing elbow pad, hot soaks, Etodolac400Bid, & consider Ortho eval for tap/ surg if not resolving...    He needs refill  of his Vicodin & requesting handicap sticker- done...    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    Notes several skin lesions and rec to get check up by Derm for bx vs excision...    He is under incr stress w/ Bro passing from Alz, neighbor is ill; he was wondering about living will & we discussed this We reviewed prob list, meds, xrays and labs> see below for updates >> given the 2014 Flu vaccine 1/15...            Problem List:  CIGARETTE SMOKER (ICD-305.1) - still smokes ~now trying E-cig... we have prev discussed smoking cessation many times but he is not motivated to quit... "I'm working on it" he says... ~  9/12:  He agreed to try Chantix & Rx written along w/ rec to call 1-800- QUIT NOW help line... ~  1/14:  Says he's switched to E-cig & hoping this will help; we discussed this & asked to wean... ~  7/14: still smoking  ~1/2ppd + Ecig & doesn't really want to quit; old CT Chest w/ several small granulomas- not seen on CXRs... He denies cough, sputum, hemoptysis, dyspnea. ~  1/15:  Clinically stable, again asked to quit smoking & offered nicotine replacement rx etc...  PULMONARY NODULE (ICD-518.89) - CXR's have been normal... CTChest 12/02 w/ stable small nodules that are likely granulomas...  ~  CXR 9/09 showed stable- clear lungs and no nodules seen on plain film. ~  CXR 6/11 showed COPD/ emphysema, no nodules, NAD..Marland Kitchen ~  CXR 4/13 showed normal heart size, clear lungs, NAD..Marland Kitchen ~  CXR 7/14 showed normal heart size, atherosclerotic Ao, clear lungs, NAD  HYPERTENSION (ICD-401.9) - controlled w/ TOPROL XL 578md, & DILTIAZEM 18052m...  ~  9/12:  BP= 106/72 and doing well.. denies HA, fatigue, visual changes, CP, dizziness, syncope, dyspnea, edema, etc... ~  4/13:  BP= 110/72 & he remains largely asymptomatic... ~  1/14:  BP= 130/82 & he denies HA, visual sx, CP, palpit,  SOB, edema, etc... ~  7/14: BP is controlled on MetoprololER50 & CardizemCD180; BP= 110/80 & he denies CP, palpit, SOB, edema; he exercises w/ yard work, swimming, etc.  CAD (ICD-414.00) - followed by DrKOwens Loffler ASA 52m55mily but off now & on XARELTO20 per [name].. ~  cath 2001 w/ 3 vessel CAD- s/p PTCA/stent... ~  had NuclearStressTest 1/08= normal without ischemia & EF=60%... ~  He denies CP, change in SOB, edema, etc... exerc= yard, some walking prior to his back pain prob. ~  He saw DrKaBenay Spice2 for Cards f/u of his CAD> he was referred to [name] due to atrial  arrhythmia- ?AFlutter, pt is essent asymptomatic, on Cardizem & Xarelto... ~  He had f/u [name] 9/13> doing well, no palpit, continues on Xarelto; pt was asked to stop smoking, continue same meds, now in [place] & doing well... ~  EKG 9/13 showed SBrady, rate50, 1st degree AVB, otherw wnl... ~  He saw [name] for Cards f/u 9/14> hx CAD & AFlutter; on MetopER50, DiltiazemCD180, Xarelto20; doing well w/o palpit, no change in meds; EKG 9/14 showed SBrady, rate57, 1st degree AVB, otherw wnl...  CARDIAC ARRHYTHMIA >> Hx SVT, PAF, AFlutter >> [name] indicated that his [place] score wasn't high enough for Coumadin therapy prev, but it was started 3/10 w/ incr PAF & ?TIA... ~  6/12:  He developed AFlutter & Cards decided to treat w/ XARELTO... Notes from [place] are reviewed... ~  2DEcho 7/12 showed norm wall thickness, norm LVF w/ EF=55-65%, normal wall motion, Gr2 DD, mild dil LA&RA... ~  7/14: he feels he is stable on the Xarelto- no CP, palpit or known arrhythmias since starting this rx...  HYPERLIPIDEMIA (ICD-272.4) - now on ATORVASTATIN 68m/d, FENOFIBRATE 1668md, + "diet" therapy... prev on Zetia but stopped by Cards & Simva80 stopped w/ new FDA guidelines. ~  FLLac qui Parle1/08 showed TChol 188, TG 170, HDL 31, LDL 123... ~  FLPine/09 showed TChol 224, TG 176, HDL 28, LDL 143... rec- incr Simvast 80/d & better diet... ~  FLVaughn2/09  showed TChol 186, TG 161, HDL 31, LDL 123... rec contin Simva80 + [id number] get wt down. ~  FLP 6/10 showed TChol 185, TG 253, HDL 32, LDL 118... rec> needs better diet + exercise, refer to [facility]. ~  FLP at work 2/11 (on Simva80+Feno160) showed TChol 184, TG 304, HDL 33, LDL 90 ~  5/11: changed from Simva80 to CRESTOR 2079m... ~  FLP 6/11 on Cres20+Feno160 showed TChol 168, TG 185, HDL 32, LDL 99 ~  FLP 12/11 on Cres20+Feno160 showed TChol 184, TG 242, HDL 30, LDL 108... needs better diet, gt wt down. ~  Cardiology increased the Crestor to 62m70m the interim... Pt didn't ret for f/u FLP. ~  FLP 5/13 on Cres?40?80+Feno160 showed TChol 185, TG 124, HDL 40, LDL 120... [facility]  FLP 1/14 on Atorva80+Feno160 showed TChol 185, TG 152, HDL 36, LDL 119  ~  FLP 7/14 on Atorva80+Feno160 showed TChol 178, TG 191, HDL33, LDL 107  OBESITY (ICD-278.00) - we discussed diet and exercise program in detail... ~  9/09:    his weight is down 12# today to 322#...  Marland Kitchen  weight 12/09 = 319# ~  weight 3/10 = 315# ~  weight 6/10 = 309# ~  weight 6/11 = 282#... keep up the great work... ~  weight 12/11 = 284... what happened? ~  Weight 9/12 = 272#... Continue the wt reduction... ~  Weight 4/13 = 281# ~  Weight 1/14 = 284# ~  Weight 7/14 = 285#  GERD - on OMEPRAZOLE 20mg59m. DIVERTICULOSIS OF COLON (ICD-562.10), & COLONIC POLYPS (ICD-211.3) - last colonoscopy was 2/06 by DrStark showing divertics, and several 2-4mm p57mps (hyperplastic)... f/u planned 2014...  BENIGN PROSTATIC HYPERTROPHY, WITH OBSTRUCTION (ICD-600.01),  Hx of TESTOSTERONE DEFICIENCY (ICD-257.2),  & ERECTILE DYSFUNCTION (ICD-302.72) - followed by Urology- [name] & PetersTerance Hart labs 12/11 showed Testosterone level = 420 (350-890) ~  7/14: he noted LTOS & would like to try Rx- start Flomax0.4mg...28mACK PAIN, LUMBAR (ICD-724.2) - Ortho eval by DrGraveTimoteo Acerecommended &  improved... he uses ETODOLAC 443mBid Prn,  SOMA 3570mid Prn, & VICODIN Prn... ~  LumbarXRay 3/10 showed mild scoliosis, DDD w/ osteophytes & facet degen changes...   PERIPHERAL NEUROPATHY, LOWER EXTREMITY (ICD-355.8) - eval 3/10 by DrGean Quint/ prob peripheral neuropathy found ?CMT ("I'm clumsey, I fall")... labs 3/10 showed borderline B 12 level = 273 by [name] & Rx'd w/ Vit B 12 50044md by mouth. ~  labs here 6/10 showed B 12 level = 584 ~  f/u Neuro eval 10/11 by [name] for Gait abn, Hx TIA, ?CMT neuropathy, LE weakness, Tremor, FamHx Alz dis> wanted to do EMG/NCV & sleep study but pt declined.  ANXIETY DEPRESSION (ICD-300.4) - on ALPRAZOLAM 0.5mg60md Prn... under alot of stress> work, home life, & wife w/ some variant of musc dystrophy...  12/09 w/ confusional episode, couldn't make decision, eval [facility] w/ neg CT Brain & neg exam, subseq MRI w/ min atrophy/ sm vessel dis, NAD; Rx ASA 81mg14m ? of B12 DEFICIENCY (ICD-266.2) & VITAMIN D DEFICIENCY (ICD-268.9) - as noted Vit B12 was borderline per [name] 3/10 (273) and he was started on oral B12 supplement ~500mcg42m. repeat B12 level 6/10 = 584, improved... Vit D level = 18 6/10 & pt rec to start Vit D supplement w/ repeat Vit D level = 27 in Mar11... OK to continue 1000 u daily... ~  labs 6/11:  Vit B12= 606;  Vit D= 35... continue supplements.   Past Surgical History  Procedure Laterality Date  . Tonsillectomy    . Clavicle surgery    . Arm fracture surgery      Outpatient Encounter Prescriptions as of 06/26/2013  Medication Sig  . ALPRAZolam (XANAX) 0.5 MG tablet Take 1/2 to 1 tablet by mouth three times daily as needed for anxiety  . atorvastatin (LIPITOR) 80 MG tablet Take 1 tablet (80 mg total) by mouth daily.  . Calcium Carbonate-Vitamin D (CALCIUM 600+D) 600-400 MG-UNIT per tablet Take 1 tablet by mouth 2 (two) times daily.    . carisoprodol (SOMA) 350 MG tablet Take 1 tablet (350 mg total) by mouth 3 (three) times daily as needed for muscle spasms.  . Cholecalciferol  (VITAMIN D) 2000 UNITS CAPS Take 1 capsule by mouth daily.  . diltMarland Kitchenazem (CARDIZEM CD) 180 MG 24 hr capsule Take 1 capsule (180 mg total) by mouth daily.  . etodMarland Kitchenlac (LODINE) 400 MG tablet Take 1 tablet (400 mg total) by mouth 2 (two) times daily.  . fenofibrate 160 MG tablet Take 1 tablet (160 mg total) by mouth daily.  . HYDRMarland Kitchencodone-acetaminophen (NORCO/VICODIN) 5-325 MG per tablet Take 1 tablet by mouth 3 (three) times daily as needed.  . metoprolol succinate (TOPROL-XL) 50 MG 24 hr tablet Take 1 tablet (50 mg total) by mouth daily. Take with or immediately following a meal.  . Multiple Vitamin (MULTIVITAMIN) tablet Take 1 tablet by mouth daily.    . omepMarland Kitchenazole (PRILOSEC) 20 MG capsule Take 1 capsule (20 mg total) by mouth daily.  . Rivaroxaban (XARELTO) 20 MG TABS Take 1 tablet (20 mg total) by mouth daily.  . tamsulosin (FLOMAX) 0.4 MG CAPS Take 1 capsule (0.4 mg total) by mouth daily after supper.  . vitamin B-12 (CYANOCOBALAMIN) 500 MCG tablet Take 500 mcg by mouth daily.    . [DISCONTINUED] cholecalciferol (VITAMIN D) 1000 UNITS tablet Take 2,000 Units by mouth daily.     Allergies  Allergen Reactions  . Hydrocod Polst-Cpm Polst Er     REACTION: hives  Current Medications, Allergies, Past Medical History, Past Surgical History, Family History, and Social History were reviewed in [employer] record.    Review of Systems        See HPI - all other systems neg except as noted...  The patient complains of dyspnea on exertion, muscle weakness, and difficulty walking.  The patient denies anorexia, fever, weight loss, weight gain, vision loss, decreased hearing, hoarseness, chest pain, syncope, peripheral edema, prolonged cough, headaches, hemoptysis, abdominal pain, melena, hematochezia, severe indigestion/heartburn, hematuria, incontinence, suspicious skin lesions, transient blindness, depression, unusual weight change, abnormal bleeding, enlarged lymph nodes, and  angioedema.     Objective:   Physical Exam    WD, Obese, 63 y/o WM in NAD... he is 6'6" tall and weighs 272lbs GENERAL:  Alert & oriented; pleasant & cooperative... HEENT:  [place]/AT, EOM-wnl, PERRLA, EACs-clear, TMs-wnl, NOSE-clear, THROAT-clear & wnl. NECK:  Supple w/ fairROM; no JVD; normal carotid impulses w/o bruits; no thyromegaly or nodules palpated; no lymphadenopathy. CHEST:  Clear to P & A; without wheezes/ rales/ or rhonchi. HEART:  Regular Rhythm; gr 1/6 SEM without rubs or gallops heard... ABDOMEN:  Soft & nontender; normal bowel sounds; no organomegaly or masses detected. EXT: without deformities, mod arthritic changes; no varicose veins/ +venous insuffic/ tr edema.  NEURO:  CN's intact;  no focal neuro deficits x sl decr sensation in LE's & wide based gait... DERM:  No lesions noted; no rash etc...  RADIOLOGY DATA:  Reviewed in the EPIC EMR & discussed w/ the patient...  LABORATORY DATA:  Reviewed in the EPIC EMR & discussed w/ the patient...   Assessment & Plan:    COPD/ Pulm Nodules on CT/ Cig Smoker>  Must quit all smoking, CXR/ CT reviewed likely granulomas, NAD... He needs to quit smoking, should continue yearly CXR f/u...  HBP>  Controlled on BBlocker, CCB;  Continue same meds + diet & exercise...  CAD>  Followed by Benay Spice & stable; last Myoview 2008 was neg; he is active yard etc...  Arrhythmias>  Followed by [name] w/ hx SVT, PAF, now AFlutter; he was switched from Coumadin to [facility]...  Hyperlipidemia>  On max meds w/ Atorva80 + Feno160- further improvement requires wt reduction!!!  Obesity>  No change at 285#  GI> GERD, Divertics, Polyps>  Followed by DrStark & repeat colon due 2014...  GU> BPH, Low-T, ED>  Followed by [name] in past; he is c/o LTOS & we will try Flomax0.4  DJD, LBP>  On Etodolac, Soma, Vicodin for his chronic pain...  NEUROPATHY>  Followed by Gean Quint w/ gait abn, TIA, ?CMT Neuropathy...  Hx Borderline B12 & low Vit D level>  He  takes oral supplements...  Anxiety/ Depression>  On Alprazolam prn & he is hoping that retirement will help...   Patient's Medications  New Prescriptions   No medications on file  Previous Medications   CALCIUM CARBONATE-VITAMIN D (CALCIUM 600+D) 600-400 MG-UNIT PER TABLET    Take 1 tablet by mouth 2 (two) times daily.     CHOLECALCIFEROL (VITAMIN D) 2000 UNITS CAPS    Take 1 capsule by mouth daily.   DILTIAZEM (CARDIZEM CD) 180 MG 24 HR CAPSULE    Take 1 capsule (180 mg total) by mouth daily.   MULTIPLE VITAMIN (MULTIVITAMIN) TABLET    Take 1 tablet by mouth daily.     VITAMIN B-12 (CYANOCOBALAMIN) 500 MCG TABLET    Take 500 mcg by mouth daily.    Modified Medications   Modified Medication Previous Medication  ALPRAZOLAM (XANAX) 0.5 MG TABLET ALPRAZolam (XANAX) 0.5 MG tablet      Take 1/2 to 1 tablet by mouth three times daily as needed for anxiety    Take 1/2 to 1 tablet by mouth three times daily as needed for anxiety   ATORVASTATIN (LIPITOR) 80 MG TABLET atorvastatin (LIPITOR) 80 MG tablet      Take 1 tablet (80 mg total) by mouth daily.    Take 1 tablet (80 mg total) by mouth daily.   CARISOPRODOL (SOMA) 350 MG TABLET carisoprodol (SOMA) 350 MG tablet      Take 1 tablet (350 mg total) by mouth 3 (three) times daily as needed for muscle spasms.    Take 1 tablet (350 mg total) by mouth 3 (three) times daily as needed for muscle spasms.   ETODOLAC (LODINE) 400 MG TABLET etodolac (LODINE) 400 MG tablet      Take 1 tablet (400 mg total) by mouth 2 (two) times daily.    Take 1 tablet (400 mg total) by mouth 2 (two) times daily.   FENOFIBRATE 160 MG TABLET fenofibrate 160 MG tablet      Take 1 tablet (160 mg total) by mouth daily.    Take 1 tablet (160 mg total) by mouth daily.   HYDROCODONE-ACETAMINOPHEN (NORCO/VICODIN) 5-325 MG PER TABLET HYDROcodone-acetaminophen (NORCO/VICODIN) 5-325 MG per tablet      Take 1 tablet by mouth 3 (three) times daily as needed.    Take 1 tablet by mouth 3  (three) times daily as needed.   METOPROLOL SUCCINATE (TOPROL-XL) 50 MG 24 HR TABLET metoprolol succinate (TOPROL-XL) 50 MG 24 hr tablet      Take 1 tablet (50 mg total) by mouth daily. Take with or immediately following a meal.    Take 1 tablet (50 mg total) by mouth daily. Take with or immediately following a meal.   OMEPRAZOLE (PRILOSEC) 20 MG CAPSULE omeprazole (PRILOSEC) 20 MG capsule      Take 1 capsule (20 mg total) by mouth daily.    Take 1 capsule (20 mg total) by mouth daily.   RIVAROXABAN (XARELTO) 20 MG TABS TABLET Rivaroxaban (XARELTO) 20 MG TABS      Take 1 tablet (20 mg total) by mouth daily.    Take 1 tablet (20 mg total) by mouth daily.   TAMSULOSIN (FLOMAX) 0.4 MG CAPS CAPSULE tamsulosin (FLOMAX) 0.4 MG CAPS      Take 1 capsule (0.4 mg total) by mouth daily after supper.    Take 1 capsule (0.4 mg total) by mouth daily after supper.  Discontinued Medications   ALPRAZOLAM (XANAX) 0.5 MG TABLET    Take 1/2 to 1 tablet by mouth three times daily as needed for anxiety   CHOLECALCIFEROL (VITAMIN D) 1000 UNITS TABLET    Take 2,000 Units by mouth daily.

## 2013-06-27 ENCOUNTER — Other Ambulatory Visit: Payer: Self-pay | Admitting: Pulmonary Disease

## 2013-06-27 MED ORDER — TAMSULOSIN HCL 0.4 MG PO CAPS: 0.4000 mg | ORAL_CAPSULE | Freq: Every day | ORAL | Status: DC

## 2013-06-27 MED ORDER — ETODOLAC 400 MG PO TABS: 400.0000 mg | ORAL_TABLET | Freq: Two times a day (BID) | ORAL | Status: DC

## 2013-06-27 MED ORDER — RIVAROXABAN 20 MG PO TABS: 20.0000 mg | ORAL_TABLET | Freq: Every day | ORAL | Status: DC

## 2013-06-27 MED ORDER — OMEPRAZOLE 20 MG PO CPDR: 20.0000 mg | DELAYED_RELEASE_CAPSULE | Freq: Every day | ORAL | Status: DC

## 2013-06-27 MED ORDER — METOPROLOL SUCCINATE ER 50 MG PO TB24: 50.0000 mg | ORAL_TABLET | Freq: Every day | ORAL | Status: DC

## 2013-06-27 MED ORDER — CARISOPRODOL 350 MG PO TABS: 350.0000 mg | ORAL_TABLET | Freq: Three times a day (TID) | ORAL | Status: DC | PRN

## 2013-06-27 MED ORDER — ALPRAZOLAM 0.5 MG PO TABS: ORAL_TABLET | ORAL | Status: DC

## 2013-06-27 MED ORDER — ATORVASTATIN CALCIUM 80 MG PO TABS: 80.0000 mg | ORAL_TABLET | Freq: Every day | ORAL | Status: DC

## 2013-06-27 MED ORDER — FENOFIBRATE 160 MG PO TABS: 160.0000 mg | ORAL_TABLET | Freq: Every day | ORAL | Status: DC

## 2013-06-27 NOTE — Telephone Encounter (Signed)
Ok to refill all meds for the pt for 30 day supply with 4 refills.  SN recs for either Dr. Larose Kells or Dr. Gwyneth Revels (male).  We can place the order for the pt once he decides who he would like to see.

## 2013-06-27 NOTE — Telephone Encounter (Signed)
Spoke with pt. Refills have been sent. He wants to see Dr. Larose Kells. Referral placed. Nothing further needed

## 2013-07-18 ENCOUNTER — Telehealth: Payer: Self-pay

## 2013-07-18 NOTE — Telephone Encounter (Signed)
Patient called to get samples of xarelto but we are out, but i will give him a savings card

## 2013-07-19 ENCOUNTER — Telehealth: Payer: Self-pay | Admitting: Pulmonary Disease

## 2013-07-19 NOTE — Telephone Encounter (Signed)
Returning call can be reached at [phone].Brendan [name]

## 2013-07-19 NOTE — Telephone Encounter (Signed)
Called and spoke with pt. He reports he dropped off disability paperwork and was told MR sent forms up to SN on 07/04/13 and have not received anything back. Please advise leigh if ya'll still have forms? thanks

## 2013-07-19 NOTE — Telephone Encounter (Signed)
lmomtcb to review forms with the pt.

## 2013-07-20 NOTE — Telephone Encounter (Signed)
lmomtcb  

## 2013-07-20 NOTE — Telephone Encounter (Signed)
Called and spoke with pt and forms will be filled in and sent down to medical records.

## 2013-07-20 NOTE — Telephone Encounter (Signed)
Forms have been completed by SN and sent back to medical records.

## 2013-07-27 ENCOUNTER — Telehealth: Payer: Self-pay | Admitting: *Deleted

## 2013-07-27 NOTE — Telephone Encounter (Signed)
Received fax from [place], patient not eligible for patient assistance for his xarelto "denied because theu have insurance coverage for the product requested" patient has been notified by [employer].

## 2013-08-01 ENCOUNTER — Telehealth: Payer: Self-pay | Admitting: Pulmonary Disease

## 2013-08-01 NOTE — Telephone Encounter (Signed)
Last OV 06/26/13 No pending OV Last fill 06/26/13 #90  SN - please advise on refill. Thanks.

## 2013-08-02 MED ORDER — HYDROCODONE-ACETAMINOPHEN 5-325 MG PO TABS: 1.0000 | ORAL_TABLET | Freq: Three times a day (TID) | ORAL | Status: DC | PRN

## 2013-08-02 NOTE — Telephone Encounter (Signed)
Per SN---  Ok to refill  He will need to set up with a new primary care .  SN will no longer be able to fill this medication after April 1.  thanks

## 2013-08-02 NOTE — Telephone Encounter (Signed)
Pt aware and rx printed for pick up. Nothing further needed.

## 2013-09-04 ENCOUNTER — Telehealth: Payer: Self-pay | Admitting: Pulmonary Disease

## 2013-09-04 DIAGNOSIS — G5790 Unspecified mononeuropathy of unspecified lower limb: Secondary | ICD-10-CM

## 2013-09-04 MED ORDER — HYDROCODONE-ACETAMINOPHEN 5-325 MG PO TABS: 1.0000 | ORAL_TABLET | Freq: Three times a day (TID) | ORAL | Status: DC | PRN

## 2013-09-04 NOTE — Telephone Encounter (Signed)
Referral has been placed. Rx has been printed. Pt is aware that referral has been made and that his prescription will be ready later today for him to pick up. Nothing further was needed.

## 2013-09-04 NOTE — Telephone Encounter (Signed)
Per SN----  Ok for the referral for the podiatry appt. Ok to refill the vicodin.  thanks

## 2013-09-04 NOTE — Telephone Encounter (Signed)
Pt last had refill on norco 08/02/13 #90 x 0 refills He has pending appt with Dr. Larose Kells 12/25/13. Please advise SN thanks

## 2013-09-20 ENCOUNTER — Ambulatory Visit: Payer: Self-pay | Admitting: Podiatry

## 2013-09-20 ENCOUNTER — Ambulatory Visit (INDEPENDENT_AMBULATORY_CARE_PROVIDER_SITE_OTHER): Payer: BC Managed Care – PPO | Admitting: Podiatry

## 2013-09-20 ENCOUNTER — Ambulatory Visit (INDEPENDENT_AMBULATORY_CARE_PROVIDER_SITE_OTHER): Payer: BC Managed Care – PPO

## 2013-09-20 ENCOUNTER — Encounter: Payer: Self-pay | Admitting: Podiatry

## 2013-09-20 VITALS — BP 131/74 | HR 62 | Resp 16 | Ht 78.0 in | Wt 283.0 lb

## 2013-09-20 DIAGNOSIS — M2040 Other hammer toe(s) (acquired), unspecified foot: Secondary | ICD-10-CM

## 2013-09-20 DIAGNOSIS — L97509 Non-pressure chronic ulcer of other part of unspecified foot with unspecified severity: Secondary | ICD-10-CM

## 2013-09-20 DIAGNOSIS — L84 Corns and callosities: Secondary | ICD-10-CM

## 2013-09-20 MED ORDER — CEPHALEXIN 500 MG PO CAPS: 500.0000 mg | ORAL_CAPSULE | Freq: Three times a day (TID) | ORAL | Status: DC

## 2013-09-20 NOTE — Progress Notes (Signed)
   Subjective:    Patient ID: Brendan [name], male    DOB: Jul 05, 1950, 63 y.o.   MRN: [id number]  HPI Comments: "I noticed my toe is so much bigger on my left than the right"  Patient c/o burning, numbness 2nd toe right for a few weeks. The toe is swollen and red. There is an ulceration at the tip. Patient does have neuropathy. He has been tested for diabetes, but was negative. He has trouble trimming toenails and its possible he nipped the skin on end of toe. He has bought larger shoes to accommodate toes.      Review of Systems  HENT: Positive for hearing loss and tinnitus.   Musculoskeletal: Positive for gait problem.  Skin:       Change in nails   Neurological: Positive for numbness.  All other systems reviewed and are negative.      Objective:   Physical Exam        Assessment & Plan:

## 2013-09-20 NOTE — Progress Notes (Signed)
Subjective:     Patient ID: Brendan [name], male   DOB: 11-Sep-1950, 63 y.o.   MRN: [id number]  Toe Pain    patient points to the left foot stating my second toe has really been draining a little bit and its swollen. States that he does not have feeling in his feet and knows that he walks on his toes   Review of Systems  All other systems reviewed and are negative.      Objective:   Physical Exam  Nursing note and vitals reviewed. Constitutional: He is oriented to person, place, and time.  Cardiovascular: Intact distal pulses.   Musculoskeletal: Normal range of motion.  Neurological: He is oriented to person, place, and time.  Skin: Skin is warm.   vascular status intact with diminishment of neurological sharp dull and vibratory of both feet. Severe digital contracture of the lesser digits with distal keratotic lesion digit 2 left keratotic lesions of the big toe of both feet. There is some breakdown of the tissue on the distal left second toe that is localized with no proximal and erythema edema or drainage noted     Assessment:     Significant structural deformity of the lesser digits with a distal keratotic lesion second left that is draining and probable localized infection.    Plan:     H&P and x-rays reviewed. Explained condition the patient and debrided the lesion with no drainage noted I applied 00 is warm and sterile dressing advised on soaks and placed on cephalexin 500 mg 3 times a day. Reappoint for [place] to recheck again in the next couple weeks and was given strict instructions of any increased redness swelling drainage her other issues should were to occur to let us know immediately or go to the emergency room

## 2013-09-28 ENCOUNTER — Encounter: Payer: Self-pay | Admitting: Podiatry

## 2013-09-28 ENCOUNTER — Ambulatory Visit (INDEPENDENT_AMBULATORY_CARE_PROVIDER_SITE_OTHER): Payer: BC Managed Care – PPO | Admitting: Podiatry

## 2013-09-28 VITALS — BP 117/72 | HR 55 | Resp 17 | Ht 78.0 in | Wt 285.0 lb

## 2013-09-28 DIAGNOSIS — M2040 Other hammer toe(s) (acquired), unspecified foot: Secondary | ICD-10-CM

## 2013-09-28 DIAGNOSIS — L97509 Non-pressure chronic ulcer of other part of unspecified foot with unspecified severity: Secondary | ICD-10-CM

## 2013-09-28 NOTE — Progress Notes (Signed)
Subjective:     Patient ID: Brendan [name], male   DOB: 1951/02/21, 63 y.o.   MRN: [id number]  HPI patient states that I think this toe is some better but it's hard for me to tell because I have neuropathy and I have difficulty feeling my toe   Review of Systems     Objective:   Physical Exam Neurovascular status unchanged with severe rigid deformity of the lesser digits with keratotic distal lesion second toe left and slight abrasion on the dorsal the second toe left secondary to wearing the aperture    Assessment:     Continue deformity second toe left which is related to the digital structure of the toe which I explained the patient    Plan:     Explained we may need to fix this toe but currently it looks healthier with no drainage noted with slight redness and slight subcutaneous exposure of the distal surface. I have recommended Iodosorb and I did send for this to be sent to him as I think it will help heal this and I advised on continuing antibiotics and today I trimmed and applied Iodosorb with sterile dressing. I gave him instructions if any redness should occur drainage proximal erythema edema to let us know immediately and if not we will see him back in 3 weeks

## 2013-10-12 ENCOUNTER — Telehealth: Payer: Self-pay | Admitting: Pulmonary Disease

## 2013-10-12 MED ORDER — HYDROCODONE-ACETAMINOPHEN 5-325 MG PO TABS: 1.0000 | ORAL_TABLET | Freq: Three times a day (TID) | ORAL | Status: DC | PRN

## 2013-10-12 NOTE — Telephone Encounter (Signed)
Pt calling requesting a refill of hydrocodone. Pt states that he is not d/t see his new PCP until 12/25/13 Pt aware he will have to come by and pick up.  Please advise Dr Lenna Gilford if okay to refill. Thanks.

## 2013-10-12 NOTE — Telephone Encounter (Signed)
Per SN--  Ok to refill meds for the pt.  We will refill this med for him until he is seen by new PCP in July.  rx printed out and left up front for the pt to pick up tomorrow.  Pt is aware and nothing further is needed.

## 2013-10-26 ENCOUNTER — Encounter: Payer: Self-pay | Admitting: Podiatry

## 2013-10-26 ENCOUNTER — Ambulatory Visit (INDEPENDENT_AMBULATORY_CARE_PROVIDER_SITE_OTHER): Payer: BC Managed Care – PPO | Admitting: Podiatry

## 2013-10-26 VITALS — BP 115/69 | HR 53 | Resp 16

## 2013-10-26 DIAGNOSIS — L97509 Non-pressure chronic ulcer of other part of unspecified foot with unspecified severity: Secondary | ICD-10-CM

## 2013-10-26 DIAGNOSIS — M2040 Other hammer toe(s) (acquired), unspecified foot: Secondary | ICD-10-CM

## 2013-10-26 NOTE — Progress Notes (Signed)
Subjective:     Patient ID: Brendan [name], male   DOB: 11-24-1950, 63 y.o.   MRN: [id number]  HPI patient presents with a improved second toe left and using devices to keep the toe elevated with reduction of redness and swelling   Review of Systems     Objective:   Physical Exam Neurovascular status unchanged with digital deformities the lesser digits and second digit left distal keratotic lesion which is improving with no drainage and minimal redness to the toe and no erythema noted    Assessment:     Improving second toe left with chronic ulceration which appears to have healed    Plan:     Advised him on continuing to keep the toe lifted and to do daily inspections. If redness swelling or drainage should occur he is to reappoint immediately

## 2013-11-30 ENCOUNTER — Telehealth: Payer: Self-pay | Admitting: Pulmonary Disease

## 2013-11-30 MED ORDER — HYDROCODONE-ACETAMINOPHEN 5-325 MG PO TABS: 1.0000 | ORAL_TABLET | Freq: Three times a day (TID) | ORAL | Status: DC | PRN

## 2013-11-30 NOTE — Telephone Encounter (Signed)
rx has been printed out and placed on SN cart to be signed.  Will call pt once this is ready to be picked up.  

## 2013-11-30 NOTE — Telephone Encounter (Signed)
rx has been signed by SN and is ready to be picked up. i have called and spoke pt and he is aware that rx has been left up front for him to pick up.

## 2013-12-05 ENCOUNTER — Other Ambulatory Visit: Payer: Self-pay | Admitting: Pulmonary Disease

## 2013-12-11 ENCOUNTER — Other Ambulatory Visit: Payer: Self-pay | Admitting: Pulmonary Disease

## 2013-12-11 MED ORDER — RIVAROXABAN 20 MG PO TABS: 20.0000 mg | ORAL_TABLET | Freq: Every day | ORAL | Status: DC

## 2013-12-17 ENCOUNTER — Other Ambulatory Visit: Payer: Self-pay | Admitting: Pulmonary Disease

## 2013-12-25 ENCOUNTER — Encounter: Payer: Self-pay | Admitting: Internal Medicine

## 2013-12-25 ENCOUNTER — Ambulatory Visit (INDEPENDENT_AMBULATORY_CARE_PROVIDER_SITE_OTHER): Payer: BC Managed Care – PPO | Admitting: Internal Medicine

## 2013-12-25 VITALS — BP 100/62 | HR 63 | Temp 98.3°F | Ht 77.5 in | Wt 277.0 lb

## 2013-12-25 DIAGNOSIS — Z23 Encounter for immunization: Secondary | ICD-10-CM

## 2013-12-25 DIAGNOSIS — M545 Low back pain, unspecified: Secondary | ICD-10-CM

## 2013-12-25 DIAGNOSIS — G5790 Unspecified mononeuropathy of unspecified lower limb: Secondary | ICD-10-CM

## 2013-12-25 DIAGNOSIS — J984 Other disorders of lung: Secondary | ICD-10-CM

## 2013-12-25 DIAGNOSIS — I483 Typical atrial flutter: Secondary | ICD-10-CM

## 2013-12-25 DIAGNOSIS — E785 Hyperlipidemia, unspecified: Secondary | ICD-10-CM

## 2013-12-25 DIAGNOSIS — N401 Enlarged prostate with lower urinary tract symptoms: Secondary | ICD-10-CM

## 2013-12-25 DIAGNOSIS — I2510 Atherosclerotic heart disease of native coronary artery without angina pectoris: Secondary | ICD-10-CM

## 2013-12-25 DIAGNOSIS — E291 Testicular hypofunction: Secondary | ICD-10-CM

## 2013-12-25 DIAGNOSIS — N138 Other obstructive and reflux uropathy: Secondary | ICD-10-CM

## 2013-12-25 DIAGNOSIS — E538 Deficiency of other specified B group vitamins: Secondary | ICD-10-CM

## 2013-12-25 DIAGNOSIS — Z2911 Encounter for prophylactic immunotherapy for respiratory syncytial virus (RSV): Secondary | ICD-10-CM

## 2013-12-25 DIAGNOSIS — I10 Essential (primary) hypertension: Secondary | ICD-10-CM

## 2013-12-25 DIAGNOSIS — I4892 Unspecified atrial flutter: Secondary | ICD-10-CM

## 2013-12-25 DIAGNOSIS — F341 Dysthymic disorder: Secondary | ICD-10-CM

## 2013-12-25 LAB — BASIC METABOLIC PANEL
BUN: 29 mg/dL — ABNORMAL HIGH (ref 6–23)
CALCIUM: 9.9 mg/dL (ref 8.4–10.5)
CO2: 28 mEq/L (ref 19–32)
Chloride: 103 mEq/L (ref 96–112)
Creatinine, Ser: 1.2 mg/dL (ref 0.4–1.5)
GFR: 62.21 mL/min (ref >60.00)
Glucose, Bld: 87 mg/dL (ref 70–99)
Potassium: 4.1 mEq/L (ref 3.5–5.1)
Sodium: 139 mEq/L (ref 135–145)

## 2013-12-25 LAB — CBC WITH DIFFERENTIAL/PLATELET
BASOS PCT: 0.4 % (ref 0.0–3.0)
Basophils Absolute: 0 10*3/uL (ref 0.0–0.1)
Eosinophils Absolute: 0.2 10*3/uL (ref 0.0–0.7)
Eosinophils Relative: 2.8 % (ref 0.0–5.0)
HCT: 45.2 % (ref 39.0–52.0)
Hemoglobin: 15.3 g/dL (ref 13.0–17.0)
LYMPHS PCT: 30.5 % (ref 12.0–46.0)
Lymphs Abs: 1.9 10*3/uL (ref 0.7–4.0)
MCHC: 33.9 g/dL (ref 30.0–36.0)
MCV: 93.2 fl (ref 78.0–100.0)
MONOS PCT: 6.7 % (ref 3.0–12.0)
Monocytes Absolute: 0.4 10*3/uL (ref 0.1–1.0)
Neutro Abs: 3.8 10*3/uL (ref 1.4–7.7)
Neutrophils Relative %: 59.6 % (ref 43.0–77.0)
Platelets: 243 10*3/uL (ref 150.0–400.0)
RBC: 4.85 Mil/uL (ref 4.22–5.81)
RDW: 13.5 % (ref 11.5–15.5)
WBC: 6.3 10*3/uL (ref 4.0–10.5)

## 2013-12-25 LAB — FOLATE: Folate: >24.8 ng/mL (ref >5.9)

## 2013-12-25 LAB — HEMOGLOBIN A1C: Hgb A1c MFr Bld: 6.1 % (ref 4.6–6.5)

## 2013-12-25 LAB — VITAMIN B12: VITAMIN B 12: 685 pg/mL (ref 211–911)

## 2013-12-25 MED ORDER — TAMSULOSIN HCL 0.4 MG PO CAPS: 0.4000 mg | ORAL_CAPSULE | Freq: Every day | ORAL | Status: DC

## 2013-12-25 MED ORDER — METOPROLOL SUCCINATE ER 50 MG PO TB24: 50.0000 mg | ORAL_TABLET | Freq: Every day | ORAL | Status: DC

## 2013-12-25 MED ORDER — RIVAROXABAN 20 MG PO TABS: 20.0000 mg | ORAL_TABLET | Freq: Every day | ORAL | Status: DC

## 2013-12-25 MED ORDER — ATORVASTATIN CALCIUM 80 MG PO TABS: 80.0000 mg | ORAL_TABLET | Freq: Every day | ORAL | Status: DC

## 2013-12-25 MED ORDER — OMEPRAZOLE 20 MG PO CPDR: 20.0000 mg | DELAYED_RELEASE_CAPSULE | Freq: Every day | ORAL | Status: DC

## 2013-12-25 MED ORDER — ETODOLAC 400 MG PO TABS: 400.0000 mg | ORAL_TABLET | Freq: Two times a day (BID) | ORAL | Status: DC

## 2013-12-25 MED ORDER — FENOFIBRATE 160 MG PO TABS: ORAL_TABLET | ORAL | Status: DC

## 2013-12-25 MED ORDER — DILTIAZEM HCL ER COATED BEADS 180 MG PO CP24: 180.0000 mg | ORAL_CAPSULE | Freq: Every day | ORAL | Status: DC

## 2013-12-25 NOTE — Assessment & Plan Note (Signed)
CAD> Followed by Benay Spice & stable; last Myoview 2008 was neg; asx, continue controlling CV RF

## 2013-12-25 NOTE — Assessment & Plan Note (Signed)
LBP,> On Etodolac, Soma, Vicodin, pt wonders if etodolac is helping at all

## 2013-12-25 NOTE — Assessment & Plan Note (Signed)
  Arrhythmias> Followed by Dr Caryl Comes w/ hx SVT, PAF, now AFlutter; currently on  XARELTO, states feels great, no CP or palpitations

## 2013-12-25 NOTE — Patient Instructions (Signed)
Get your blood work before you leave   Call for refills as needed    Next visit is for routine check up in 4 months

## 2013-12-25 NOTE — Assessment & Plan Note (Signed)
COPD/ Pulm Nodules on CT  Patient continue smoking , per Dr. Lenna Gilford  he needs to continue yearly CXR f/u Last CXR 12-2012 :  Findings: Lung volumes are normal. No consolidative airspace disease. No pleural effusions. No pneumothorax. No pulmonary nodule or mass noted. Pulmonary vasculature and the cardiomediastinal silhouette are within normal limits. Atherosclerosis of the thoracic aorta. Tiny dense nodular opacity  projecting over the lower left hemithorax associated with the inferior margin of the anterolateral left fifth rib is unchanged, most compatible with a small bone island.  IMPRESSION:  1. No radiographic evidence of acute cardiopulmonary disease.  2. Atherosclerosis.

## 2013-12-25 NOTE — Assessment & Plan Note (Signed)
On the top doses of Lipitor and fenofibrate , recheck a cholesterol panel on return to the office

## 2013-12-25 NOTE — Assessment & Plan Note (Signed)
on Flomax 0.4 , still has some sx , DRE on RTC

## 2013-12-25 NOTE — Assessment & Plan Note (Signed)
Last testosterone level few years ago: normal, on no  supplements

## 2013-12-25 NOTE — Progress Notes (Signed)
Pre visit review using our clinic review tool, if applicable. No additional management support is needed unless otherwise documented below in the visit note. 

## 2013-12-25 NOTE — Assessment & Plan Note (Signed)
Currently controlled on alprazolam as needed, will call for refills

## 2013-12-25 NOTE — Progress Notes (Signed)
Subjective:    Patient ID: Brendan [name], male    DOB: 08-30-1950, 63 y.o.   MRN: [id number]  DOS:  12/25/2013 Type of visit - description: new pt, transferring from Brendan [name] History: In general feels well History of back pain and neuropathy, takes etodolac, hydrocodone and soma with relatively good control of his symptoms Has developed a sore at the left second toe for the last 6 months, status post antibiotics, sees podiatry, currently with no discharge.  He is a new patient, chart is reviewed  ROS Denies chest pain or difficulty breathing. No lower extremity edema No nausea, vomiting, diarrhea or blood in the stools. Some urinary urgency and difficulty with his flow  Past Medical History  Diagnosis Date  . Pulmonary nodule   . HTN (hypertension)   . CAD (coronary artery disease)     stent, 2001 / nuclear, June, 2010, no ischemia  . PAF (paroxysmal atrial fibrillation)   . Dyslipidemia   . Obesity   . Diverticulosis of colon   . History of colonic polyps   . BPH (benign prostatic hypertrophy)   . Testosterone deficiency   . ED (erectile dysfunction)   . Lumbar back pain   . Peripheral neuropathy     lower extremities  . Anxiety and depression   . Vitamin D deficiency   . Blurred vision     episode with confusion, evaluated Brendan [name]  . Ejection fraction   . B12 deficiency   . Scalp lesion     November, 2011  . Warfarin anticoagulation   . Atrial flutter     New diagnosis November 10, 2010, rate controlled    Past Surgical History  Procedure Laterality Date  . Tonsillectomy    . Clavicle surgery    . Arm fracture surgery      History   Social History  . Marital Status: Married    Spouse Name: N/A    Number of Children: N/A  . Years of Education: N/A   Occupational History  . Not on file.   Social History Main Topics  . Smoking status: Current Every Day Smoker -- 0.30 packs/day  . Smokeless tobacco: Not on file     Comment: smokes about 3  daily  .  Alcohol Use: 2.5 oz/week    5 drink(s) per week  . Drug Use: Not on file  . Sexual Activity: Not on file   Other Topics Concern  . Not on file   Social History Narrative  . No narrative on file        Medication List       This list is accurate as of: 12/25/13  9:49 AM.  Always use your most recent med list.               ALPRAZolam 0.5 MG tablet  Commonly known as:  XANAX  take 1/2 to 1 tablet by mouth three times daily as needed for anxiety     atorvastatin 80 MG tablet  Commonly known as:  LIPITOR  Take 1 tablet (80 mg total) by mouth daily.     CALCIUM 1000 + D PO  Take by mouth.     CALCIUM 600+D 600-400 MG-UNIT per tablet  Generic drug:  Calcium Carbonate-Vitamin D  Take 1 tablet by mouth daily.     carisoprodol 350 MG tablet  Commonly known as:  SOMA  Take 1 tablet (350 mg total) by mouth 3 (three) times daily as needed for muscle  spasms.     cephALEXin 500 MG capsule  Commonly known as:  KEFLEX  Take 1 capsule (500 mg total) by mouth 3 (three) times daily.     diltiazem 180 MG 24 hr capsule  Commonly known as:  CARDIZEM CD  Take 1 capsule (180 mg total) by mouth daily.     etodolac 400 MG tablet  Commonly known as:  LODINE  Take 1 tablet (400 mg total) by mouth 2 (two) times daily.     fenofibrate 160 MG tablet  take one tablet by mouth once daily     HYDROcodone-acetaminophen 5-325 MG per tablet  Commonly known as:  NORCO/VICODIN  Take 1 tablet by mouth 3 (three) times daily as needed.     Magnesium 400 MG Caps  Take by mouth.     metoprolol succinate 50 MG 24 hr tablet  Commonly known as:  TOPROL-XL  Take 1 tablet (50 mg total) by mouth daily. Take with or immediately following a meal.     multivitamin tablet  Take 1 tablet by mouth daily.     omeprazole 20 MG capsule  Commonly known as:  PRILOSEC  Take 1 capsule (20 mg total) by mouth daily.     rivaroxaban 20 MG Tabs tablet  Commonly known as:  XARELTO  Take 1 tablet (20 mg  total) by mouth daily.     tamsulosin 0.4 MG Caps capsule  Commonly known as:  FLOMAX  Take 1 capsule (0.4 mg total) by mouth daily after supper.     vitamin B-12 500 MCG tablet  Commonly known as:  CYANOCOBALAMIN  Take 500 mcg by mouth daily.     Vitamin D 2000 UNITS Caps  Take 1 capsule by mouth daily.     ZINC 15 PO  Take by mouth.           Objective:   Physical Exam BP 100/62  Pulse 63  Temp(Src) 98.3 F (36.8 C)  Ht 6' 5.5" (1.969 m)  Wt 277 lb (125.646 kg)  BMI 32.41 kg/m2  SpO2 97% General -- alert, well-developed, NAD.  Neck --no thyromegaly  HEENT-- Not pale.  Lungs -- normal respiratory effort, no intercostal retractions, no accessory muscle use, and normal breath sounds.  Heart-- irreg  Abdomen-- Not distended, good bowel sounds,soft, non-tender.   Extremities-- trace pretibial edema bilaterally , ++ Hammer toes, 2th L toe has a close sore, no d/c, redness or odor  Neurologic--  alert & oriented X3. Speech normal  Psych-- Cognition and judgment appear intact. Cooperative with normal attention span and concentration. No anxious or depressed appearing.      Assessment & Plan:             .

## 2013-12-25 NOTE — Assessment & Plan Note (Addendum)
Reports a long history of neuropathy, saw Dr Erling Cruz before, last visit years ago, will get records; now issue complicated by a toe sore f/u by podiatry. He takes Etodolac, Soma, Vicodin in part to control the Neuropathy pain

## 2013-12-25 NOTE — Assessment & Plan Note (Signed)
HBP> Controlled on BBlocker, CCB; Continue same meds , check a BMP

## 2013-12-25 NOTE — Assessment & Plan Note (Signed)
Taking by mouth supplements, labs

## 2013-12-26 LAB — VITAMIN D 25 HYDROXY (VIT D DEFICIENCY, FRACTURES): VITD: 54.29 ng/mL

## 2014-01-05 ENCOUNTER — Telehealth: Payer: Self-pay | Admitting: Internal Medicine

## 2014-01-05 MED ORDER — HYDROCODONE-ACETAMINOPHEN 5-325 MG PO TABS: 1.0000 | ORAL_TABLET | Freq: Three times a day (TID) | ORAL | Status: DC | PRN

## 2014-01-05 NOTE — Telephone Encounter (Signed)
Caller name:Ty Relation to pt: Call back number:[phone] Pharmacy:  Reason for call:re-fill on  HYDROcodone-acetaminophen (NORCO/VICODIN) 5-325 MG per tablet

## 2014-01-05 NOTE — Telephone Encounter (Signed)
rx refill- hydrocodone 5-325mg  Last refilled- 11/30/13 # 90 / 0 rf  Last OV- 12/25/13  UDS- none

## 2014-01-05 NOTE — Telephone Encounter (Signed)
I am happy to fill, but patient will have to pick up Rx at the [facility].  If patient not willing to come here, please forward request to [facility] or Dr. Etter Sjogren so that Rx can be signed and picked up at [facility].

## 2014-01-05 NOTE — Telephone Encounter (Signed)
Rx printed, signed and placed at front desk for pick up.

## 2014-01-05 NOTE — Telephone Encounter (Signed)
Pt to pick up at [facility].

## 2014-01-05 NOTE — Telephone Encounter (Signed)
Let me know what patient decides.

## 2014-01-22 ENCOUNTER — Telehealth: Payer: Self-pay

## 2014-01-22 MED ORDER — CARISOPRODOL 350 MG PO TABS: 350.0000 mg | ORAL_TABLET | Freq: Three times a day (TID) | ORAL | Status: DC | PRN

## 2014-01-22 NOTE — Telephone Encounter (Signed)
Previous PCP refilled it  regularly.  RF done

## 2014-01-22 NOTE — Telephone Encounter (Signed)
Pt requesting refill for Soma.   Last fill: 06/27/2013 #90 with 5 RF. Last OV: 12/25/2013 Last UDS: none   Please Advise.

## 2014-01-23 ENCOUNTER — Other Ambulatory Visit: Payer: Self-pay

## 2014-01-23 MED ORDER — CARISOPRODOL 350 MG PO TABS: 350.0000 mg | ORAL_TABLET | Freq: Three times a day (TID) | ORAL | Status: DC | PRN

## 2014-01-23 NOTE — Telephone Encounter (Signed)
Faxed medication to [facility], spoke with Pt and informed him that medication has been faxed.

## 2014-01-23 NOTE — Telephone Encounter (Signed)
Caller name: Rankin  Relation to pt: self Call back number: [phone] Pharmacy: [facility] [phone]  Reason for call: pt would like to know the status of carisoprodol (SOMA) 350 MG tablet refill.

## 2014-01-23 NOTE — Telephone Encounter (Signed)
Thank you Dr. Larose Kells

## 2014-01-25 ENCOUNTER — Encounter: Payer: Self-pay | Admitting: Internal Medicine

## 2014-01-31 ENCOUNTER — Telehealth: Payer: Self-pay

## 2014-01-31 MED ORDER — RIVAROXABAN 20 MG PO TABS: 20.0000 mg | ORAL_TABLET | Freq: Every day | ORAL | Status: DC

## 2014-01-31 NOTE — Telephone Encounter (Signed)
Brendan [name] [phone] [facility] in [facility] [address] called to say starting September he goes on Medicare and he would like for [place] to change his rivaroxaban (XARELTO) 20 MG TABS tablet To a 90 day instead of a 30 day this time, that way he can get it filled before his insurance changes and that will say him a lot of money.

## 2014-01-31 NOTE — Telephone Encounter (Signed)
Spoke with Pt, medication sent to Pharmacy.

## 2014-02-09 ENCOUNTER — Encounter: Payer: Self-pay | Admitting: Cardiology

## 2014-02-09 ENCOUNTER — Ambulatory Visit (INDEPENDENT_AMBULATORY_CARE_PROVIDER_SITE_OTHER): Payer: Medicare Other | Admitting: Cardiology

## 2014-02-09 VITALS — BP 136/80 | HR 57 | Ht 77.5 in | Wt 273.4 lb

## 2014-02-09 DIAGNOSIS — E785 Hyperlipidemia, unspecified: Secondary | ICD-10-CM | POA: Diagnosis not present

## 2014-02-09 DIAGNOSIS — R9430 Abnormal result of cardiovascular function study, unspecified: Secondary | ICD-10-CM

## 2014-02-09 DIAGNOSIS — I4892 Unspecified atrial flutter: Secondary | ICD-10-CM

## 2014-02-09 DIAGNOSIS — Z9229 Personal history of other drug therapy: Secondary | ICD-10-CM

## 2014-02-09 DIAGNOSIS — F17200 Nicotine dependence, unspecified, uncomplicated: Secondary | ICD-10-CM | POA: Diagnosis not present

## 2014-02-09 DIAGNOSIS — Z7901 Long term (current) use of anticoagulants: Secondary | ICD-10-CM

## 2014-02-09 DIAGNOSIS — I10 Essential (primary) hypertension: Secondary | ICD-10-CM

## 2014-02-09 DIAGNOSIS — R0989 Other specified symptoms and signs involving the circulatory and respiratory systems: Secondary | ICD-10-CM

## 2014-02-09 DIAGNOSIS — I2510 Atherosclerotic heart disease of native coronary artery without angina pectoris: Secondary | ICD-10-CM | POA: Diagnosis not present

## 2014-02-09 NOTE — Assessment & Plan Note (Signed)
We know that he is normal wall motion as of 2012 with an ejection fraction 55-65%. No further workup.

## 2014-02-09 NOTE — Assessment & Plan Note (Signed)
He had a stent in 2001. Nuclear study in June, 2010 revealed no ischemia. He works actively in his yard. We discussed the pros and cons of proceeding with exercise testing at this time. He has no symptoms. No exercise test at this point.

## 2014-02-09 NOTE — Patient Instructions (Signed)
**Note De-identified Keath Matera Obfuscation** Your physician recommends that you continue on your current medications as directed. Please refer to the Current Medication list given to you today.  Your physician wants you to follow-up in: 1 year. You will receive a reminder letter in the mail two months in advance. If you don't receive a letter, please call our office to schedule the follow-up appointment.  

## 2014-02-09 NOTE — Assessment & Plan Note (Signed)
He is on guideline directed high-dose atorvastatin. No change in therapy.

## 2014-02-09 NOTE — Assessment & Plan Note (Signed)
Blood pressure is controlled. No change in therapy. 

## 2014-02-09 NOTE — Assessment & Plan Note (Signed)
His atrial flutter was diagnosed in 2012. When I saw him last year he was in sinus and he is currently in sinus rhythm. He is anticoagulated.

## 2014-02-09 NOTE — Assessment & Plan Note (Signed)
He is anticoagulated for his atrial fibrillation. Aspirin is not being used because he has not had any unstable coronary symptoms. This will be continued.  As part of today's evaluation I spent greater than 25 minutes for this total care. One half of this time was with direct contact with him. We discussed smoking and coronary disease at great length. We also discussed other issues concerning his coronary status and his arrhythmia

## 2014-02-09 NOTE — Assessment & Plan Note (Signed)
Patient continues to smoke a few cigarettes daily. Once again I counseled him about this and we talked fairly extensively.

## 2014-02-09 NOTE — Progress Notes (Signed)
Patient ID: Brendan [name], male   DOB: 04-Dec-1950, 63 y.o.   MRN: [id number]    HPI  Patient is seen in followup atrial flutter and coronary artery disease. He's doing well. He is retired and he is very active in his yard at home. He's not having any significant shortness of breath or chest pain. He had a stent in 2001. He had a nuclear study with no ischemia in 2010. He's had atrial flutter in the past. However he converted spontaneously. Last year when I saw him he was in sinus rhythm. He is anticoagulated.  Allergies  Allergen Reactions  . Hydrocod Polst-Cpm Polst Er     REACTION: hives    Current Outpatient Prescriptions  Medication Sig Dispense Refill  . ALPRAZolam (XANAX) 0.5 MG tablet take 1/2 to 1 tablet by mouth three times daily as needed for anxiety  90 tablet  3  . atorvastatin (LIPITOR) 80 MG tablet Take 1 tablet (80 mg total) by mouth daily.  30 tablet  6  . Calcium Carb-Cholecalciferol (CALCIUM 1000 + D PO) Take by mouth.      . carisoprodol (SOMA) 350 MG tablet Take 1 tablet (350 mg total) by mouth 3 (three) times daily as needed for muscle spasms.  90 tablet  5  . Cholecalciferol (VITAMIN D) 2000 UNITS CAPS Take 1 capsule by mouth daily.      Marland Kitchen diltiazem (CARDIZEM CD) 180 MG 24 hr capsule Take 1 capsule (180 mg total) by mouth daily.  30 capsule  6  . etodolac (LODINE) 400 MG tablet Take 1 tablet (400 mg total) by mouth 2 (two) times daily.  60 tablet  6  . fenofibrate 160 MG tablet take one tablet by mouth once daily  30 tablet  6  . HYDROcodone-acetaminophen (NORCO/VICODIN) 5-325 MG per tablet Take 1 tablet by mouth 3 (three) times daily as needed.  90 tablet  0  . Magnesium 400 MG CAPS Take by mouth.      . metoprolol succinate (TOPROL-XL) 50 MG 24 hr tablet Take 1 tablet (50 mg total) by mouth daily. Take with or immediately following a meal.  30 tablet  6  . Multiple Vitamin (MULTIVITAMIN) tablet Take 1 tablet by mouth daily.        Marland Kitchen omeprazole (PRILOSEC) 20 MG  capsule Take 1 capsule (20 mg total) by mouth daily.  30 capsule  6  . rivaroxaban (XARELTO) 20 MG TABS tablet Take 1 tablet (20 mg total) by mouth daily.  90 tablet  1  . tamsulosin (FLOMAX) 0.4 MG CAPS capsule Take 1 capsule (0.4 mg total) by mouth daily after supper.  30 capsule  6  . vitamin B-12 (CYANOCOBALAMIN) 500 MCG tablet Take 500 mcg by mouth daily.         No current facility-administered medications for this visit.    History   Social History  . Marital Status: Married    Spouse Name: N/A    Number of Children: 0  . Years of Education: N/A   Occupational History  . [employer]    Social History Main Topics  . Smoking status: Current Every Day Smoker -- 0.50 packs/day    Types: Cigarettes  . Smokeless tobacco: Never Used     Comment: smokes about 1/2 ppd  . Alcohol Use: 2.5 oz/week    5 drink(s) per week  . Drug Use: Not on file  . Sexual Activity: Not on file   Other Topics Concern  . Not  on file   Social History Narrative   Lives w/ wife ,no children of his own,   wife's daughter-Gk live w/ them    Family History  Problem Relation Age of Onset  . Stroke Mother   . Alzheimer's disease Mother     Jerilynn Mages, brother   . Heart failure Father   . Stroke Father   . Colon cancer Neg Hx   . Prostate cancer      Past Medical History  Diagnosis Date  . Pulmonary nodule   . HTN (hypertension)   . CAD (coronary artery disease)     stent, 2001 / nuclear, June, 2010, no ischemia  . PAF (paroxysmal atrial fibrillation)   . Dyslipidemia   . Obesity   . Diverticulosis of colon   . History of colonic polyps   . BPH (benign prostatic hypertrophy)   . Testosterone deficiency   . ED (erectile dysfunction)   . Lumbar back pain   . Peripheral neuropathy     lower extremities  . Anxiety and depression   . Vitamin D deficiency   . Blurred vision     episode with confusion, evaluated Dr. Erling Cruz  . Ejection fraction   . B12 deficiency   . Scalp lesion     November, 2011    . Atrial flutter     New diagnosis November 10, 2010, rate controlled    Past Surgical History  Procedure Laterality Date  . Tonsillectomy    . Clavicle surgery      remotely  . Arm fracture surgery      remotely    Patient Active Problem List   Diagnosis Date Noted  . Dyslipidemia     Priority: High  . CIGARETTE SMOKER 07/23/2007    Priority: High  . Ejection fraction   . HX: anticoagulation 02/14/2012  . GERD (gastroesophageal reflux disease) 02/11/2011  . Atrial flutter   . HTN (hypertension)   . CAD (coronary artery disease)   . Obesity   . B12 deficiency   . PAF (paroxysmal atrial fibrillation)   . VITAMIN D DEFICIENCY 05/20/2009  . PERIPHERAL NEUROPATHY, LOWER EXTREMITY 09/10/2008  . BACK PAIN, LUMBAR 08/03/2008  . ERECTILE DYSFUNCTION 02/09/2008  . BENIGN PROSTATIC HYPERTROPHY, WITH OBSTRUCTION 02/09/2008  . COLONIC POLYPS 07/23/2007  . PULMONARY NODULE 07/23/2007  . DIVERTICULOSIS OF COLON 07/23/2007  . ANXIETY DEPRESSION 07/08/2007    ROS   Patient denies fever, chills, headache, sweats, rash, change in vision, change in hearing, chest pain, cough, nausea vomiting, urinary symptoms. All other systems are reviewed and are negative.  PHYSICAL EXAM  Patient is overweight but stable. Head is atraumatic. Sclera and conjunctiva are normal. There is no jugulovenous distention. Lungs are clear. Respiratory effort is nonlabored. Cardiac exam reveals S1 and S2. The rhythm is regular. Abdomen is soft. There is no peripheral edema. There no musculoskeletal deformities. There are no skin rashes.  Filed Vitals:   02/09/14 1202  BP: 136/80  Pulse: 57  Height: 6' 5.5" (1.969 m)  Weight: 273 lb 6.4 oz (124.013 kg)   EKG is done today and reviewed by me. There is sinus rhythm. No significant change from the past.  ASSESSMENT & PLAN

## 2014-02-22 ENCOUNTER — Telehealth: Payer: Self-pay | Admitting: Internal Medicine

## 2014-02-22 NOTE — Telephone Encounter (Signed)
Pt is requesting refill for Hydrocodone.  Last OV: 12/25/2013 Last Fill: 01/05/2014 # 90 with 0 RF UDS: None  Please advise.

## 2014-02-22 NOTE — Telephone Encounter (Signed)
Caller name: Chadd  Relation to pt: self  Call back number: [phone]   Reason for call:   requesting refill of HYDROcodone-acetaminophen (NORCO/VICODIN) 5-325 MG per tablet please advise pt when ready for pick up

## 2014-02-23 ENCOUNTER — Telehealth: Payer: Self-pay | Admitting: Internal Medicine

## 2014-02-23 MED ORDER — HYDROCODONE-ACETAMINOPHEN 5-325 MG PO TABS: 1.0000 | ORAL_TABLET | Freq: Three times a day (TID) | ORAL | Status: DC | PRN

## 2014-02-23 MED ORDER — BACLOFEN 10 MG PO TABS: 10.0000 mg | ORAL_TABLET | Freq: Three times a day (TID) | ORAL | Status: DC | PRN

## 2014-02-23 NOTE — Telephone Encounter (Signed)
Pt informed that medication is ready for pick up at front desk.

## 2014-02-23 NOTE — Telephone Encounter (Signed)
Pts new insurance will not cover Soma, Pt called with medications insurance will cover:    Duloxetine Baclofen Tizanidine  Pt would like to know if it would be okay to get refills on one of the above medications for muscle spasms.    Please advise.

## 2014-02-23 NOTE — Telephone Encounter (Signed)
Caller name: Mynor  Relation to pt: self  Call back number: [phone]   Reason for call:  Insurance company advised pt instead of carisoprodol (SOMA) 350 MG tablet a cheaper medication would be;  duloxetine Baclofen Tizanidine  Pt would like to discuss.

## 2014-02-23 NOTE — Telephone Encounter (Signed)
Advise patient, I sent a prescription for baclofen. Patient to call if he has a problem with s/e

## 2014-02-23 NOTE — Telephone Encounter (Signed)
rx x 2 printed

## 2014-02-23 NOTE — Telephone Encounter (Signed)
Informed Pt Baclofen has been sent to [facility], instructed him to give [place] a call if side effects arise.

## 2014-03-05 ENCOUNTER — Other Ambulatory Visit: Payer: Self-pay

## 2014-03-05 MED ORDER — ATORVASTATIN CALCIUM 80 MG PO TABS: 80.0000 mg | ORAL_TABLET | Freq: Every day | ORAL | Status: DC

## 2014-03-28 ENCOUNTER — Telehealth: Payer: Self-pay | Admitting: Internal Medicine

## 2014-03-28 ENCOUNTER — Ambulatory Visit (INDEPENDENT_AMBULATORY_CARE_PROVIDER_SITE_OTHER): Payer: Medicare Other | Admitting: Medical

## 2014-03-28 ENCOUNTER — Encounter: Payer: Self-pay | Admitting: Medical

## 2014-03-28 ENCOUNTER — Other Ambulatory Visit: Payer: Self-pay

## 2014-03-28 VITALS — BP 112/72 | HR 63 | Temp 97.9°F | Ht 75.5 in | Wt 275.4 lb

## 2014-03-28 DIAGNOSIS — I2510 Atherosclerotic heart disease of native coronary artery without angina pectoris: Secondary | ICD-10-CM | POA: Diagnosis not present

## 2014-03-28 DIAGNOSIS — R062 Wheezing: Secondary | ICD-10-CM

## 2014-03-28 DIAGNOSIS — J0100 Acute maxillary sinusitis, unspecified: Secondary | ICD-10-CM | POA: Insufficient documentation

## 2014-03-28 MED ORDER — PREDNISONE 20 MG PO TABS: 20.0000 mg | ORAL_TABLET | Freq: Two times a day (BID) | ORAL | Status: DC

## 2014-03-28 MED ORDER — ALBUTEROL SULFATE HFA 108 (90 BASE) MCG/ACT IN AERS: 2.0000 | INHALATION_SPRAY | Freq: Four times a day (QID) | RESPIRATORY_TRACT | Status: DC | PRN

## 2014-03-28 MED ORDER — BECLOMETHASONE DIPROPIONATE 40 MCG/ACT IN AERS: 2.0000 | INHALATION_SPRAY | Freq: Two times a day (BID) | RESPIRATORY_TRACT | Status: DC

## 2014-03-28 MED ORDER — CEFDINIR 300 MG PO CAPS: 300.0000 mg | ORAL_CAPSULE | Freq: Two times a day (BID) | ORAL | Status: DC

## 2014-03-28 NOTE — Progress Notes (Signed)
Called Qvar,Albuterol,Cefdinir,and Prednisone to [facility] per E Saguier.

## 2014-03-28 NOTE — Assessment & Plan Note (Signed)
With possible bronchitis. Rx cefdinir.

## 2014-03-28 NOTE — Progress Notes (Signed)
Subjective:    Patient ID: Brendan [name], male    DOB: [birth year], 63 y.o.   MRN: [id number]  HPI  Nasal congestion for 2 wks. Just won't resolve. He feels some pressure in his sinus. A lot of runny nose. Some cough at night. Mild occasional productive cough. Wife sick with bronchitis.Pt caring after wife. Pt has some wheezing at night.  Past Medical History  Diagnosis Date  . Pulmonary nodule   . HTN (hypertension)   . CAD (coronary artery disease)     stent, 2001 / nuclear, June, 2010, no ischemia  . PAF (paroxysmal atrial fibrillation)   . Dyslipidemia   . Obesity   . Diverticulosis of colon   . History of colonic polyps   . BPH (benign prostatic hypertrophy)   . Testosterone deficiency   . ED (erectile dysfunction)   . Lumbar back pain   . Peripheral neuropathy     lower extremities  . Anxiety and depression   . Vitamin D deficiency   . Blurred vision     episode with confusion, evaluated Dr. Erling Cruz  . Ejection fraction   . B12 deficiency   . Scalp lesion     November, 2011  . Atrial flutter     New diagnosis November 10, 2010, rate controlled    History   Social History  . Marital Status: Married    Spouse Name: N/A    Number of Children: 0  . Years of Education: N/A   Occupational History  . [employer]    Social History Main Topics  . Smoking status: Current Every Day Smoker -- 0.50 packs/day    Types: Cigarettes  . Smokeless tobacco: Never Used     Comment: smokes about 1/2 ppd  . Alcohol Use: 2.5 oz/week    5 drink(s) per week  . Drug Use: Not on file  . Sexual Activity: Not on file   Other Topics Concern  . Not on file   Social History Narrative   Lives w/ wife ,no children of his own,   wife's daughter-Gk live w/ them    Past Surgical History  Procedure Laterality Date  . Tonsillectomy    . Clavicle surgery      remotely  . Arm fracture surgery      remotely    Family History  Problem Relation Age of Onset  . Stroke Mother   .  Alzheimer's disease Mother     Jerilynn Mages, brother   . Heart failure Father   . Stroke Father   . Colon cancer Neg Hx   . Prostate cancer      Allergies  Allergen Reactions  . Hydrocod Polst-Cpm Polst Er     REACTION: hives    Current Outpatient Prescriptions on File Prior to Visit  Medication Sig Dispense Refill  . ALPRAZolam (XANAX) 0.5 MG tablet take 1/2 to 1 tablet by mouth three times daily as needed for anxiety  90 tablet  3  . atorvastatin (LIPITOR) 80 MG tablet Take 1 tablet (80 mg total) by mouth daily.  90 tablet  1  . Calcium Carb-Cholecalciferol (CALCIUM 1000 + D PO) Take by mouth.      . Cholecalciferol (VITAMIN D) 2000 UNITS CAPS Take 1 capsule by mouth daily.      Marland Kitchen diltiazem (CARDIZEM CD) 180 MG 24 hr capsule Take 1 capsule (180 mg total) by mouth daily.  30 capsule  6  . etodolac (LODINE) 400 MG tablet Take 1 tablet (  400 mg total) by mouth 2 (two) times daily.  60 tablet  6  . fenofibrate 160 MG tablet take one tablet by mouth once daily  30 tablet  6  . HYDROcodone-acetaminophen (NORCO/VICODIN) 5-325 MG per tablet Take 1 tablet by mouth 3 (three) times daily as needed.  90 tablet  0  . Magnesium 400 MG CAPS Take by mouth.      . metoprolol succinate (TOPROL-XL) 50 MG 24 hr tablet Take 1 tablet (50 mg total) by mouth daily. Take with or immediately following a meal.  30 tablet  6  . Multiple Vitamin (MULTIVITAMIN) tablet Take 1 tablet by mouth daily.        Marland Kitchen omeprazole (PRILOSEC) 20 MG capsule Take 1 capsule (20 mg total) by mouth daily.  30 capsule  6  . rivaroxaban (XARELTO) 20 MG TABS tablet Take 1 tablet (20 mg total) by mouth daily.  90 tablet  1  . tamsulosin (FLOMAX) 0.4 MG CAPS capsule Take 1 capsule (0.4 mg total) by mouth daily after supper.  30 capsule  6  . vitamin B-12 (CYANOCOBALAMIN) 500 MCG tablet Take 500 mcg by mouth daily.        . baclofen (LIORESAL) 10 MG tablet Take 1 tablet (10 mg total) by mouth 3 (three) times daily as needed for muscle spasms.  90  each  0   No current facility-administered medications on file prior to visit.    BP 112/72  Pulse 63  Temp(Src) 97.9 F (36.6 C) (Oral)  Ht 6' 3.5" (1.918 m)  Wt 275 lb 6.4 oz (124.921 kg)  BMI 33.96 kg/m2  SpO2 96%       Review of Systems  Constitutional: Negative for fever, chills and fatigue.  HENT: Positive for congestion and sinus pressure. Negative for ear discharge, ear pain, nosebleeds, sore throat, tinnitus and trouble swallowing.   Respiratory: Positive for cough and wheezing. Negative for chest tightness and shortness of breath.        Occasional.  Cardiovascular: Negative for chest pain and palpitations.  Gastrointestinal: Negative.   Musculoskeletal: Negative for back pain, joint swelling, myalgias and neck stiffness.  Neurological: Negative for dizziness, seizures, syncope, weakness, light-headedness, numbness and headaches.  Hematological: Negative for adenopathy. Does not bruise/bleed easily.       Objective:   Physical Exam  General  Mental Status - Alert. General Appearance - Well groomed. Not in acute distress.  Skin Rashes- No Rashes.  HEENT Head- Normal. Ear Auditory Canal - Left- Normal. Right - Normal.Tympanic Membrane- Left- Normal. Right- Normal. Eye Sclera/Conjunctiva- Left- Normal. Right- Normal. Nose & Sinuses Nasal Mucosa- Left-  Boggy + Congested. Right-  Boggy + Congested. Faint sinus pressure Mouth & Throat Lips: Upper Lip- Normal: no dryness, cracking, pallor, cyanosis, or vesicular eruption. Lower Lip-Normal: no dryness, cracking, pallor, cyanosis or vesicular eruption. Buccal Mucosa- Bilateral- No Aphthous ulcers. Oropharynx- No Discharge or Erythema. Tonsils: Characteristics- Bilateral- No Erythema or Congestion. Size/Enlargement- Bilateral- No enlargement. Discharge- bilateral-None.  Neck Neck- Supple. No Masses.   Chest and Lung Exam Auscultation: Breath Sounds:- Even and unlabored. But mild  shallow  Cardiovascular Auscultation:Rythm- Regular, rate and rhythm. Murmurs & Other Heart Sounds:Ausculatation of the heart reveal- No Murmurs.  Lymphatic Head & Neck General Head & Neck Lymphatics: Bilateral: Description- No Localized lymphadenopathy.        Assessment & Plan:

## 2014-03-28 NOTE — Telephone Encounter (Signed)
Medications called to [facility] per patient request.

## 2014-03-28 NOTE — Assessment & Plan Note (Signed)
Possible preceded by allergy symptoms. Rx prednisone short course, qvar  Inhaler and albuterol inhaler.

## 2014-03-28 NOTE — Telephone Encounter (Signed)
Caller name: Lauralyn Primes   Pharmacy: [facility]   Reason for call:  Pt saw Percell Miller; Pt is wanting his meds called into the pharmacy downstairs, not the [facility]  albuterol (PROVENTIL HFA;VENTOLIN HFA) 108 (90 BASE) MCG/ACT inhaler  beclomethasone (QVAR) 40 MCG/ACT inhaler  cefdinir (OMNICEF) 300 MG capsule  predniSONE (DELTASONE) 20 MG tablet

## 2014-03-28 NOTE — Progress Notes (Signed)
Pre visit review using our clinic review tool, if applicable. No additional management support is needed unless otherwise documented below in the visit note. 

## 2014-03-28 NOTE — Patient Instructions (Signed)
You appear to have some recent allergic rhinitis vs uri that preceded wheezing and possible sinusitis/bronchitis.  I am prescribing 3 day short course prednisone, qvar inhaler, albuterol inhaler, and cefdinir antibiotic.  If your symptoms worsen, change or persist let us know.  Follow up in 7 days or as needed.

## 2014-03-29 ENCOUNTER — Telehealth: Payer: Self-pay | Admitting: Internal Medicine

## 2014-03-29 NOTE — Telephone Encounter (Signed)
emmi emailed °

## 2014-04-02 ENCOUNTER — Telehealth: Payer: Self-pay | Admitting: Medical

## 2014-04-02 ENCOUNTER — Telehealth: Payer: Self-pay

## 2014-04-02 NOTE — Telephone Encounter (Signed)
Pt states that he completed his round of prednisone and plans to complete his antibiotics.  He has also added Coricidin HBP as needed and states that he is feeling much better.  However, he does not plan to take anymore of the albuterol or QVAR even after having the discussion regarding his symptoms--feeling jittery and increased heart rate--being related to the albuterol and not the QVAR.

## 2014-04-02 NOTE — Telephone Encounter (Signed)
FYI

## 2014-04-02 NOTE — Telephone Encounter (Signed)
[facility] Triage Call Report Triage Record Num: [id number] Operator: Natasha Bence Patient Name: Brendan [name] Call Date & Time: 03/31/2014 2:23:03PM Patient Phone: [phone] PCP: Patient Gender: Male PCP Fax : Patient DOB: 1950/08/21 Practice Name: [facility]  Reason for Call: Caller: Tim/Patient; PCP: Mackie Pai; CB#: [phone]; Call regarding medication issue. Pt states he was seen on 10/21 and diagnosed with URI. Pt was prescribed Prednisone x 3 days, Albuterol and Q-Var Inhalers, and Cefdinir. Pt states the inhalers make him feel gittery and have given him a dry mouth. Pt states he stopped taking them on 10/23 and wants to know if that is okay. States he has had no SOB and URI symptoms have gotten a little better, but he has occasional wheezing when he is lying down. Afebrile. Triaged per Breathing Problems guideline. Needs to call provider within 72 hours for "Evaluated by provider and has questions/concern about their condition, treatment plan, treatment options, follow-up appointments, or other follow-up care". Advised pt he may D/C inhalers especially since they were prescribed PRN and he is not having any SOB. Also advised pt to call office on 10/26 if his URI symptoms are not better. Homecare advise and call back parameters given per guideline. Pt verbalized understanding.  Protocol(s) Used: Breathing Problems Protocol(s) Used: Upper Respiratory Infection (URI) Recommended Outcome per Protocol: Call Provider within 72 Hours Reason for Outcome: Breathing symptoms (wheezing, shortness of breath, changes in rates of breathing, skin color changes) main problem Evaluated by provider and has question/concern about their condition, treatment plan, treatment options, follow-up appointments, or other follow-up care. Care Advice: ~ [place] / MAINTENANCE ~ Tell your provider about any other symptoms that you are currently experiencing or any that have  concerned you. ~ Talk with your provider openly regarding your questions about treatment options and available supportive care. ~Continue to follow your treatment plan until seeing or talking with your provider. Take all medications as prescribed. If you have questions or concerns about your treatment plan, ask about them when you call for your appointment. ~ Call your provider during regular office hours to discuss questions or concerns about your condition or treatment plan. ~ Discuss reasons for changing or not following your treatment plan with your provider. ~Participating in support groups with other individuals with chronic conditions may be beneficial. If you are interested in doing so ask your provider for a local resource.

## 2014-04-02 NOTE — Telephone Encounter (Signed)
The jittery feeling is likely from the albuterol. Speeds up heart rate. He could definitely stop albuterol. Only use that if he feels sob. Qvar not likely not cause of jittery feel so he could continue with that.

## 2014-04-03 ENCOUNTER — Encounter: Payer: Self-pay | Admitting: Cardiology

## 2014-04-04 NOTE — Telephone Encounter (Signed)
**Note De-Identified Brendan [name] Obfuscation** Per Dr Ron Parker the pt is advised to come into the office for an EKG. The pt agrees and states that he will be here at 10:30 Friday (10/30). Also per the pts request Dr Ron Parker has ordered a Lipid profile and LFT's to be drawn that day. Appointments have been placed.

## 2014-04-06 ENCOUNTER — Other Ambulatory Visit (INDEPENDENT_AMBULATORY_CARE_PROVIDER_SITE_OTHER): Payer: Medicare Other | Admitting: *Deleted

## 2014-04-06 ENCOUNTER — Ambulatory Visit (INDEPENDENT_AMBULATORY_CARE_PROVIDER_SITE_OTHER): Payer: Medicare Other

## 2014-04-06 ENCOUNTER — Other Ambulatory Visit: Payer: Self-pay

## 2014-04-06 VITALS — HR 53

## 2014-04-06 DIAGNOSIS — I10 Essential (primary) hypertension: Secondary | ICD-10-CM

## 2014-04-06 DIAGNOSIS — I480 Paroxysmal atrial fibrillation: Secondary | ICD-10-CM

## 2014-04-06 DIAGNOSIS — I2510 Atherosclerotic heart disease of native coronary artery without angina pectoris: Secondary | ICD-10-CM | POA: Diagnosis not present

## 2014-04-06 DIAGNOSIS — I483 Typical atrial flutter: Secondary | ICD-10-CM | POA: Diagnosis not present

## 2014-04-06 DIAGNOSIS — E785 Hyperlipidemia, unspecified: Secondary | ICD-10-CM | POA: Diagnosis not present

## 2014-04-06 LAB — LIPID PANEL
Cholesterol: 169 mg/dL (ref 0–200)
HDL: 33.3 mg/dL — AB (ref >39.00)
LDL Cholesterol: 108 mg/dL — ABNORMAL HIGH (ref 0–99)
NONHDL: 135.7
Total CHOL/HDL Ratio: 5
Triglycerides: 138 mg/dL (ref 0.0–149.0)
VLDL: 27.6 mg/dL (ref 0.0–40.0)

## 2014-04-06 LAB — HEPATIC FUNCTION PANEL
ALBUMIN: 3.5 g/dL (ref 3.5–5.2)
ALK PHOS: 45 U/L (ref 39–117)
ALT: 23 U/L (ref 0–53)
AST: 25 U/L (ref 0–37)
Bilirubin, Direct: 0.2 mg/dL (ref 0.0–0.3)
TOTAL PROTEIN: 6.5 g/dL (ref 6.0–8.3)
Total Bilirubin: 1 mg/dL (ref 0.2–1.2)

## 2014-04-06 NOTE — Progress Notes (Signed)
EKG shows NSR. This doesn't necessarily mean that he did not pop into one of his atrial arrhythmias, but today he is in normal rhythm. He is appropriately anticoagulated. Observe for recurrence. Dayna Dunn PA-C

## 2014-04-06 NOTE — Progress Notes (Signed)
1.) Reason for visit: The pt is concerned that he may be having palpitations/irrg. hr due to taking Albuterol and QVAR that was prescribed by his PCP for nasal congestion and coughing on 03/28/14.   2.) Name of MD requesting visit: Dr Ron Parker  3.) H&P: the pt has a HX of PAF and a-flutter  4.) ROS related to problem: the pt was advised by his PCP to stop taking Albuterol and QVAR on 03/30/14 due to feeling jittery. The pt has no complaints today.  5.) Assessment and plan per MD: EKG given to Sharrell Ku, PA ([facility] provider) for review and it is normal. The pt is given lots of reassurance that his EKG is normal and did not show any irregular beats. He expressed relief and thanked me for my assistance.

## 2014-04-08 ENCOUNTER — Other Ambulatory Visit: Payer: Self-pay | Admitting: Pulmonary Disease

## 2014-04-10 ENCOUNTER — Encounter: Payer: Self-pay | Admitting: Internal Medicine

## 2014-04-12 ENCOUNTER — Telehealth: Payer: Self-pay

## 2014-04-12 MED ORDER — ALPRAZOLAM 0.5 MG PO TABS: ORAL_TABLET | ORAL | Status: DC

## 2014-04-12 NOTE — Telephone Encounter (Signed)
Pt is requesting refill on Alprazolam  Last OV: 12/25/2013 Last Fill: 12/06/2013 # 90 3 RF by Dr. Lenna Gilford UDS: None   Please advise.

## 2014-04-12 NOTE — Telephone Encounter (Signed)
done

## 2014-04-12 NOTE — Telephone Encounter (Signed)
Faxed to [facility].  

## 2014-04-16 ENCOUNTER — Telehealth: Payer: Self-pay | Admitting: Internal Medicine

## 2014-04-16 MED ORDER — ONETOUCH ULTRASOFT LANCETS MISC: Status: DC

## 2014-04-16 MED ORDER — GLUCOSE BLOOD VI STRP: ORAL_STRIP | Status: DC

## 2014-04-16 NOTE — Telephone Encounter (Signed)
Strips and lancets sent to [facility].

## 2014-04-16 NOTE — Telephone Encounter (Signed)
Caller name: tim Relation to pt: Call back number: [phone] [facility] [address]  Reason for call:   Patient states that he was told that he was pre-diabetic. He wants to test muptiple times per day and would like to know if Dr. Larose Kells would prescribe test strips or lancets to go with one touch ultra 2 meter. Made by Wynetta Emery and johnson([employer])

## 2014-04-17 ENCOUNTER — Telehealth: Payer: Self-pay

## 2014-04-17 NOTE — Telephone Encounter (Signed)
[facility] [phone]  Someone from [facility] called and stated they needed diagnose codes for test strips,

## 2014-04-17 NOTE — Telephone Encounter (Signed)
Given R73.09 for pre diabetic ICD-10 code.

## 2014-04-17 NOTE — Telephone Encounter (Signed)
Tried calling [facility] back, was on hold for over 5 minutes. I could not hold any longer, will try recontacting them again.

## 2014-04-17 NOTE — Telephone Encounter (Signed)
LMOM at [facility] for Pt to only check BS twice daily.

## 2014-04-17 NOTE — Telephone Encounter (Signed)
Please call Claib[facility] Billings at [facility] regarding this rx. She needs directions for this rx so they can bill insurance. [phone]

## 2014-04-27 ENCOUNTER — Telehealth: Payer: Self-pay | Admitting: *Deleted

## 2014-04-27 ENCOUNTER — Ambulatory Visit (HOSPITAL_BASED_OUTPATIENT_CLINIC_OR_DEPARTMENT_OTHER)
Admission: RE | Admit: 2014-04-27 | Discharge: 2014-04-27 | Disposition: A | Discharge status: Home / Self Care | Payer: Medicare Other | Source: Ambulatory Visit | Attending: Internal Medicine | Admitting: Internal Medicine

## 2014-04-27 ENCOUNTER — Encounter: Payer: Self-pay | Admitting: Internal Medicine

## 2014-04-27 ENCOUNTER — Ambulatory Visit (INDEPENDENT_AMBULATORY_CARE_PROVIDER_SITE_OTHER): Payer: Medicare Other | Admitting: Internal Medicine

## 2014-04-27 VITALS — BP 143/76 | HR 66 | Temp 98.4°F | Wt 279.1 lb

## 2014-04-27 DIAGNOSIS — G629 Polyneuropathy, unspecified: Secondary | ICD-10-CM | POA: Diagnosis not present

## 2014-04-27 DIAGNOSIS — R911 Solitary pulmonary nodule: Secondary | ICD-10-CM

## 2014-04-27 DIAGNOSIS — I2510 Atherosclerotic heart disease of native coronary artery without angina pectoris: Secondary | ICD-10-CM

## 2014-04-27 DIAGNOSIS — R7309 Other abnormal glucose: Secondary | ICD-10-CM | POA: Diagnosis not present

## 2014-04-27 DIAGNOSIS — N400 Enlarged prostate without lower urinary tract symptoms: Secondary | ICD-10-CM

## 2014-04-27 DIAGNOSIS — I10 Essential (primary) hypertension: Secondary | ICD-10-CM

## 2014-04-27 DIAGNOSIS — F341 Dysthymic disorder: Secondary | ICD-10-CM

## 2014-04-27 DIAGNOSIS — Z0000 Encounter for general adult medical examination without abnormal findings: Secondary | ICD-10-CM

## 2014-04-27 DIAGNOSIS — Z79899 Other long term (current) drug therapy: Secondary | ICD-10-CM | POA: Diagnosis not present

## 2014-04-27 DIAGNOSIS — E785 Hyperlipidemia, unspecified: Secondary | ICD-10-CM | POA: Diagnosis not present

## 2014-04-27 DIAGNOSIS — Z23 Encounter for immunization: Secondary | ICD-10-CM

## 2014-04-27 DIAGNOSIS — R7303 Prediabetes: Secondary | ICD-10-CM

## 2014-04-27 DIAGNOSIS — Z79891 Long term (current) use of opiate analgesic: Secondary | ICD-10-CM | POA: Diagnosis not present

## 2014-04-27 LAB — BASIC METABOLIC PANEL
BUN: 26 mg/dL — AB (ref 6–23)
CALCIUM: 10.1 mg/dL (ref 8.4–10.5)
CO2: 29 mEq/L (ref 19–32)
Chloride: 103 mEq/L (ref 96–112)
Creatinine, Ser: 1.2 mg/dL (ref 0.4–1.5)
GFR: 63.33 mL/min (ref >60.00)
Glucose, Bld: 97 mg/dL (ref 70–99)
Potassium: 5.3 mEq/L — ABNORMAL HIGH (ref 3.5–5.1)
SODIUM: 141 meq/L (ref 135–145)

## 2014-04-27 MED ORDER — METOPROLOL SUCCINATE ER 50 MG PO TB24: 50.0000 mg | ORAL_TABLET | Freq: Every day | ORAL | Status: DC

## 2014-04-27 MED ORDER — ATORVASTATIN CALCIUM 80 MG PO TABS: 80.0000 mg | ORAL_TABLET | Freq: Every day | ORAL | Status: DC

## 2014-04-27 MED ORDER — RIVAROXABAN 20 MG PO TABS: 20.0000 mg | ORAL_TABLET | Freq: Every day | ORAL | Status: DC

## 2014-04-27 MED ORDER — ZOSTER VACCINE LIVE 19400 UNT/0.65ML ~~LOC~~ SOLR: 0.6500 mL | Freq: Once | SUBCUTANEOUS | Status: DC

## 2014-04-27 MED ORDER — FENOFIBRATE 160 MG PO TABS: ORAL_TABLET | ORAL | Status: DC

## 2014-04-27 MED ORDER — DILTIAZEM HCL ER COATED BEADS 180 MG PO CP24: 180.0000 mg | ORAL_CAPSULE | Freq: Every day | ORAL | Status: DC

## 2014-04-27 MED ORDER — HYDROCODONE-ACETAMINOPHEN 5-325 MG PO TABS: 1.0000 | ORAL_TABLET | Freq: Three times a day (TID) | ORAL | Status: DC | PRN

## 2014-04-27 MED ORDER — TAMSULOSIN HCL 0.4 MG PO CAPS: 0.4000 mg | ORAL_CAPSULE | Freq: Every day | ORAL | Status: DC

## 2014-04-27 MED ORDER — OMEPRAZOLE 20 MG PO CPDR: 20.0000 mg | DELAYED_RELEASE_CAPSULE | Freq: Every day | ORAL | Status: DC

## 2014-04-27 NOTE — Progress Notes (Signed)
Subjective:    Patient ID: Brendan [name], male    DOB: [birth year], 63 y.o.   MRN: [id number]  DOS:  04/27/2014 Type of visit - description : rov Interval history: Has a number of issues that needs to discuss with me  Developed palpitations after she was prescribed Qvar and albuterol, quite upset about the situation, discussed issue :never tried albuterol before, his response to the medication was unpredictable  High cholesterol, wonders if he needs Crestor BPH, on Flomax, symptoms not completely well control, increased dose? Hydrocodone, needs a refill, it really makes a difference in his quality of life Anxiety-- pt retired, spending more time at home, stress has increased. Increase Xanax? Wonders about shingles shot, Prevnar and a colonoscopy   ROS No fever chills No nausea, vomiting, diarrhea. No blood in the stools. No dysuria or gross hematuria  Past Medical History  Diagnosis Date  . Pulmonary nodule   . HTN (hypertension)   . CAD (coronary artery disease)     stent, 2001 / nuclear, June, 2010, no ischemia  . PAF (paroxysmal atrial fibrillation)   . Dyslipidemia   . Obesity   . Diverticulosis of colon   . History of colonic polyps   . BPH (benign prostatic hypertrophy)   . Testosterone deficiency   . ED (erectile dysfunction)   . Lumbar back pain   . Peripheral neuropathy     lower extremities  . Anxiety and depression   . Vitamin D deficiency   . Blurred vision     episode with confusion, evaluated Dr. Erling Cruz  . Ejection fraction   . B12 deficiency   . Scalp lesion     November, 2011  . Atrial flutter     New diagnosis November 10, 2010, rate controlled    Past Surgical History  Procedure Laterality Date  . Tonsillectomy    . Clavicle surgery      remotely  . Arm fracture surgery      remotely    History   Social History  . Marital Status: Married    Spouse Name: N/A    Number of Children: 0  . Years of Education: N/A   Occupational History  .  [employer]    Social History Main Topics  . Smoking status: Current Every Day Smoker -- 0.50 packs/day    Types: Cigarettes  . Smokeless tobacco: Never Used     Comment: smokes about 1/2 ppd  . Alcohol Use: 2.5 oz/week    5 drink(s) per week  . Drug Use: Not on file  . Sexual Activity: Not on file   Other Topics Concern  . Not on file   Social History Narrative   Lives w/ wife ,no children of his own,   wife's daughter-Gk live w/ them        Medication List       This list is accurate as of: 04/27/14 11:59 PM.  Always use your most recent med list.               ALPRAZolam 0.5 MG tablet  Commonly known as:  XANAX  take 1/2 to 1 tablet by mouth three times daily as needed for anxiety     atorvastatin 80 MG tablet  Commonly known as:  LIPITOR  Take 1 tablet (80 mg total) by mouth daily.     CALCIUM 1000 + D PO  Take by mouth.     carisoprodol 350 MG tablet  Commonly known as:  SOMA  Take 350 mg by mouth daily as needed for muscle spasms.     diltiazem 180 MG 24 hr capsule  Commonly known as:  CARDIZEM CD  Take 1 capsule (180 mg total) by mouth daily.     etodolac 400 MG tablet  Commonly known as:  LODINE  Take 1 tablet (400 mg total) by mouth 2 (two) times daily.     fenofibrate 160 MG tablet  take one tablet by mouth once daily     glucose blood test strip  Use as instructed     HYDROcodone-acetaminophen 5-325 MG per tablet  Commonly known as:  NORCO/VICODIN  Take 1 tablet by mouth 3 (three) times daily as needed.     metoprolol succinate 50 MG 24 hr tablet  Commonly known as:  TOPROL-XL  Take 1 tablet (50 mg total) by mouth daily. Take with or immediately following a meal.     multivitamin tablet  Take 1 tablet by mouth daily.     omeprazole 20 MG capsule  Commonly known as:  PRILOSEC  Take 1 capsule (20 mg total) by mouth daily.     onetouch ultrasoft lancets  Use as instructed     rivaroxaban 20 MG Tabs tablet  Commonly known as:  XARELTO    Take 1 tablet (20 mg total) by mouth daily.     tamsulosin 0.4 MG Caps capsule  Commonly known as:  FLOMAX  Take 1 capsule (0.4 mg total) by mouth daily after supper.     vitamin B-12 500 MCG tablet  Commonly known as:  CYANOCOBALAMIN  Take 500 mcg by mouth daily.     Vitamin D 2000 UNITS Caps  Take 1 capsule by mouth daily.     zoster vaccine live (PF) 19400 UNT/0.65ML injection  Commonly known as:  ZOSTAVAX  Inject 19,400 Units into the skin once.           Objective:   Physical Exam BP 143/76 mmHg  Pulse 66  Temp(Src) 98.4 F (36.9 C) (Oral)  Wt 279 lb 2 oz (126.61 kg)  SpO2 93% General -- alert, well-developed, NAD.    Lungs -- normal respiratory effort, no intercostal retractions, no accessory muscle use, and normal breath sounds.  Heart-- normal rate, seems regular  , no murmur.  Abdomen-- Not distended, good bowel sounds,soft, non-tender. Rectal-- No external abnormalities noted. Normal sphincter tone. No rectal masses or tenderness. No stool found  Prostate--Prostate gland firm and smooth, no enlargement, nodularity, tenderness, mass, asymmetry or induration. Extremities-- no pretibial edema bilaterally  Neurologic--  alert & oriented X3. Speech normal, gait appropriate for age, strength symmetric and appropriate for age.  Psych-- Cognition and judgment appear intact. Cooperative with normal attention span and concentration. No anxious or depressed appearing.        Assessment & Plan:  Today , I spent more than  35  min with the patient: >50% of the time counseling regards "reaction" from some of the medicines, see history of present illness, aggressing a number of questions of concerns about many of his chronic problems.  Also coordinating his care

## 2014-04-27 NOTE — Patient Instructions (Signed)
Get your blood work before you leave , also a UDS   Stop by the first floor and get the XR     Please come back to the office in 4 months  for a routine check up

## 2014-04-27 NOTE — Telephone Encounter (Signed)
Prior authorization for Xarelto initiated. Awaiting determination. JG//CMA  

## 2014-04-27 NOTE — Progress Notes (Signed)
Pre visit review using our clinic review tool, if applicable. No additional management support is needed unless otherwise documented below in the visit note. 

## 2014-04-28 DIAGNOSIS — R739 Hyperglycemia, unspecified: Secondary | ICD-10-CM | POA: Insufficient documentation

## 2014-04-28 DIAGNOSIS — Z0000 Encounter for general adult medical examination without abnormal findings: Secondary | ICD-10-CM | POA: Insufficient documentation

## 2014-04-28 NOTE — Assessment & Plan Note (Signed)
Chart reviewed, next colonoscopy due  2016 Prevnar today Zostavax prescription provided

## 2014-04-28 NOTE — Assessment & Plan Note (Signed)
Pulmonary nodule, Check a chest x-ray

## 2014-04-28 NOTE — Assessment & Plan Note (Addendum)
Peripheral neuropathy--On etodolac, Soma and Vicodin. Has seen Dr. Erling Cruz several times since 2010: Workup included serum protein electrophoresis, ACE, TSH, B12 which was a slightly low with a normal methylmalonic acid  08-2008 had a lumbar spine MRI with multilevel spondylosis Dr. 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 Love 2010 he was also diagnosed with possible TIA, ? OSA. history of essential tremor. at some point he was concerned about starting induced myopathy  Plan: Recent B12 normal Check a UDS Continue with present care, RF medications as needed, as  stated in the history of present illness Vicodin significantly increase his quality of life.

## 2014-04-28 NOTE — Assessment & Plan Note (Signed)
High cholesterol, discussed Crestor, cost is an issues, question of myopathy before (?) , pt will think about

## 2014-04-28 NOTE — Assessment & Plan Note (Signed)
BPH, symptoms are not  well-controlled, DRE negative. Plan: Refer to urology

## 2014-04-28 NOTE — Assessment & Plan Note (Signed)
  Prediabetes with a A1c of 6.1 (4 months ago),  patient aware of the diagnosis. cbg 137 yesterday

## 2014-04-28 NOTE — Assessment & Plan Note (Signed)
Anxiety, currently on Xanax only, symptoms not as well-controlled, will consider SSRIs, will discuss on return to the office

## 2014-04-30 ENCOUNTER — Other Ambulatory Visit: Payer: Self-pay

## 2014-05-01 ENCOUNTER — Encounter: Payer: Self-pay | Admitting: Internal Medicine

## 2014-05-07 ENCOUNTER — Telehealth: Payer: Self-pay | Admitting: Internal Medicine

## 2014-05-07 NOTE — Telephone Encounter (Signed)
Refill x1 

## 2014-05-07 NOTE — Telephone Encounter (Signed)
Printed and faxed to [facility].

## 2014-05-07 NOTE — Telephone Encounter (Signed)
Pt is requesting refill on Alprazolam.   Last OV: 04/27/2014 Last Fill: 04/12/2014 # 90 0RF Pt takes 1/2 tablet to 1 tablet three times daily PRN UDS: Awaiting UDS results  Please advise.

## 2014-05-14 ENCOUNTER — Telehealth: Payer: Self-pay | Admitting: Cardiology

## 2014-05-14 NOTE — Telephone Encounter (Signed)
New problem   Need prior auth for Xarelto. Order # [id number].Marland Kitchen Please call.

## 2014-05-16 ENCOUNTER — Telehealth: Payer: Self-pay | Admitting: Cardiology

## 2014-05-16 ENCOUNTER — Encounter: Payer: Self-pay | Admitting: Cardiology

## 2014-05-16 ENCOUNTER — Telehealth: Payer: Self-pay | Admitting: *Deleted

## 2014-05-16 NOTE — Telephone Encounter (Signed)
PA for Xarelto sent via CoverMyMeds 

## 2014-05-16 NOTE — Telephone Encounter (Signed)
PA denied, insurance company called to start appeal process. Decision within 7 days.

## 2014-05-16 NOTE — Telephone Encounter (Signed)
Received fax from [facility] [facility] on [address], need a written statement from prescribing physician supporting use of requested medication.  Will send to Dr. Ron Parker and refax information back to them once I have received information from him.

## 2014-05-16 NOTE — Telephone Encounter (Signed)
New Msg  Pt states that he is being denied his medication for Xarelto until he can get a reason from Dr. Ron Parker as to why he needs it.States he needs a fax sent to [facility], the fax number is [phone] or back up [phone] and reference # of [id number]. Pt may be reached [phone].

## 2014-05-16 NOTE — Telephone Encounter (Signed)
I left a detailed message on the pts VM stating that a PA for his Xarelto has been sent to [employer].

## 2014-05-17 NOTE — Telephone Encounter (Signed)
Appeal documentation faxed to [place].

## 2014-05-21 ENCOUNTER — Telehealth: Payer: Self-pay

## 2014-05-21 NOTE — Telephone Encounter (Signed)
UDS: 04/27/2014  Positive for Hydrocodone Positive for Xanax Positive for Soma  Low risk per Dr. Larose Kells 05/18/2014

## 2014-05-29 NOTE — Telephone Encounter (Signed)
Received fax stating PA is not required and patient will be able to get a 90 day supply.

## 2014-05-30 ENCOUNTER — Encounter: Payer: Self-pay | Admitting: Internal Medicine

## 2014-06-04 ENCOUNTER — Telehealth: Payer: Self-pay | Admitting: Family Medicine

## 2014-06-04 NOTE — Telephone Encounter (Signed)
PA for Xarelto approved through 05/15/2015. Left message with patient and contacted pharmacy.

## 2014-06-04 NOTE — Telephone Encounter (Signed)
Pt is requesting refill on Alprazolam.  Last OV: 04/27/2014 Last Fill: 05/07/2014 # 17 0RF UDS: 04/27/2014 Low risk  Please advise.

## 2014-06-04 NOTE — Telephone Encounter (Signed)
Faxed to [facility].  

## 2014-06-04 NOTE — Telephone Encounter (Signed)
Caller name: [place], [place] Relation to pt: self  Call back number: [phone] Pharmacy:[facility]   Reason for call:  Pt requesting a refill ALPRAZolam (XANAX) 0.5 MG tablet. Pt requesting 30 day supply

## 2014-06-04 NOTE — Telephone Encounter (Signed)
done

## 2014-06-06 ENCOUNTER — Telehealth: Payer: Self-pay | Admitting: Internal Medicine

## 2014-06-06 MED ORDER — HYDROCODONE-ACETAMINOPHEN 5-325 MG PO TABS: 1.0000 | ORAL_TABLET | Freq: Three times a day (TID) | ORAL | Status: DC | PRN

## 2014-06-06 NOTE — Telephone Encounter (Signed)
Spoke with Pt informed him rx is ready for pick up at front desk. Pt stated he will try to pick up tomorrow before 2.

## 2014-06-06 NOTE — Telephone Encounter (Signed)
Caller name: Ruslan, Mccabe Relation to pt: self  Call back number: [phone]  Reason for call:  Pt requesting a refill HYDROcodone-acetaminophen (NORCO/VICODIN) 5-325 MG per tablet

## 2014-06-06 NOTE — Telephone Encounter (Signed)
Pt is requesting refill on Hydrocodone  Last OV: 04/27/2014  Last Fill: 04/27/2014 # 30 0RF UDS: 04/27/2014 Low risk  Please advise.

## 2014-06-06 NOTE — Telephone Encounter (Signed)
done

## 2014-06-07 ENCOUNTER — Telehealth: Payer: Self-pay | Admitting: Internal Medicine

## 2014-06-07 DIAGNOSIS — E785 Hyperlipidemia, unspecified: Secondary | ICD-10-CM

## 2014-06-07 DIAGNOSIS — R7303 Prediabetes: Secondary | ICD-10-CM

## 2014-06-07 DIAGNOSIS — E875 Hyperkalemia: Secondary | ICD-10-CM

## 2014-06-07 NOTE — Telephone Encounter (Signed)
Yes, please arrange a visit with a nutritionist.

## 2014-06-07 NOTE — Telephone Encounter (Signed)
Called and spoke with Pt, he informed me he is confused about what he can and can not eat due to having prediabetes, high potassium, and hyperlipidemia. Pt informed me he is consulting the list of foods low in potassium I sent him, but is still unsure. Pt wondered if he would benefit from having a consultation with a nutritionist. Informed him I would speak with Dr. Larose Kells and would let him know. Pt verbalized understanding.

## 2014-06-07 NOTE — Telephone Encounter (Signed)
Referral placed to nutritionist.

## 2014-06-07 NOTE — Telephone Encounter (Signed)
Pt would like for you to give him a call states he has questions regarding his last lab results and to discuss things that are going on with him.

## 2014-06-14 ENCOUNTER — Telehealth: Payer: Self-pay | Admitting: Cardiology

## 2014-06-14 NOTE — Telephone Encounter (Signed)
New message     Patient c/o Palpitations:  High priority if patient c/o lightheadedness and shortness of breath.  1. How long have you been having palpitations? 1-2 months ago---off-n-on.  Most recent 3-4 days ago (palp started when he had to have an inhaler for an URI)  2. Are you currently experiencing lightheadedness and shortness of breath? no  3. Have you checked your BP and heart rate? (document readings) no  4. Are you experiencing any other symptoms? Not really  Pt needs to have a colonoscopy in feb.  He want to make sure this last episode of palpitations will not interfer with him having a colonscopy.

## 2014-06-14 NOTE — Telephone Encounter (Signed)
Patient reports intermittent palpitations past couple of months. He has not checked a BP or pulse. No symptoms at present time. He wants to know if this can be attributed to an Albuterol Inhaler he was prescribed for an URI. i advised him it most likely was, but he said he is throwing it away. He states that ever since starting Xarelto, he has felt good without any flutter. His main concern is an upcoming colonoscopy. Please advise.

## 2014-06-15 NOTE — Telephone Encounter (Signed)
Let's add him to the schedule on Monday so that I can review his overall status and do an EKG.

## 2014-06-15 NOTE — Telephone Encounter (Signed)
Patient informed. Added to schedule on Monday 06/18/2014 at 4:00 pm

## 2014-06-18 ENCOUNTER — Encounter (INDEPENDENT_AMBULATORY_CARE_PROVIDER_SITE_OTHER): Payer: Medicare Other

## 2014-06-18 ENCOUNTER — Ambulatory Visit (INDEPENDENT_AMBULATORY_CARE_PROVIDER_SITE_OTHER): Payer: Medicare Other | Admitting: Cardiology

## 2014-06-18 ENCOUNTER — Encounter: Payer: Self-pay | Admitting: *Deleted

## 2014-06-18 ENCOUNTER — Encounter: Payer: Self-pay | Admitting: Cardiology

## 2014-06-18 VITALS — BP 94/54 | HR 51 | Ht 78.0 in | Wt 281.0 lb

## 2014-06-18 DIAGNOSIS — R002 Palpitations: Secondary | ICD-10-CM

## 2014-06-18 DIAGNOSIS — N5201 Erectile dysfunction due to arterial insufficiency: Secondary | ICD-10-CM | POA: Diagnosis not present

## 2014-06-18 DIAGNOSIS — I483 Typical atrial flutter: Secondary | ICD-10-CM | POA: Diagnosis not present

## 2014-06-18 DIAGNOSIS — N401 Enlarged prostate with lower urinary tract symptoms: Secondary | ICD-10-CM | POA: Diagnosis not present

## 2014-06-18 DIAGNOSIS — I10 Essential (primary) hypertension: Secondary | ICD-10-CM | POA: Diagnosis not present

## 2014-06-18 DIAGNOSIS — F341 Dysthymic disorder: Secondary | ICD-10-CM | POA: Diagnosis not present

## 2014-06-18 DIAGNOSIS — I2510 Atherosclerotic heart disease of native coronary artery without angina pectoris: Secondary | ICD-10-CM

## 2014-06-18 DIAGNOSIS — R001 Bradycardia, unspecified: Secondary | ICD-10-CM

## 2014-06-18 DIAGNOSIS — N3281 Overactive bladder: Secondary | ICD-10-CM | POA: Diagnosis not present

## 2014-06-18 DIAGNOSIS — R3915 Urgency of urination: Secondary | ICD-10-CM | POA: Diagnosis not present

## 2014-06-18 NOTE — Progress Notes (Signed)
Patient ID: Brendan [name], male   DOB: Sep 01, 1950, 64 y.o.   MRN: [id number] [employer] 30 day cardiac event monitor applied to patient.

## 2014-06-18 NOTE — Patient Instructions (Signed)
**Note De-Identified Brendan [name] Obfuscation** Your physician recommends that you continue on your current medications as directed. Please refer to the Current Medication list given to you today.  Your physician has recommended that you wear an event monitor for 21 days. Event monitors are medical devices that record the heart's electrical activity. Doctors most often [place] these monitors to diagnose arrhythmias. Arrhythmias are problems with the speed or rhythm of the heartbeat. The monitor is a small, portable device. You can wear one while you do your normal daily activities. This is usually used to diagnose what is causing palpitations/syncope (passing out).  Your physician recommends that you schedule a follow-up appointment in: July 12, 2014 per Dr Ron Parker

## 2014-06-19 ENCOUNTER — Encounter: Payer: Self-pay | Admitting: Gastroenterology

## 2014-06-19 DIAGNOSIS — R001 Bradycardia, unspecified: Secondary | ICD-10-CM | POA: Insufficient documentation

## 2014-06-19 NOTE — Assessment & Plan Note (Signed)
Blood pressures controlled. No change in therapy. 

## 2014-06-19 NOTE — Assessment & Plan Note (Signed)
The patient has sinus bradycardia. He is on a beta blocker. I believe that his sinus bradycardia is asymptomatic. I've not change his beta blocker because he describes palpitations. I will first await the results of his 21 day event recorder.  As part of today's evaluation I spent greater than 25 minutes with his total care. We had extensive discussion about all of his issues including his home situation and his medical concerns.

## 2014-06-19 NOTE — Progress Notes (Signed)
Patient ID: Brendan [name], male   DOB: Jan 13, 1951, 64 y.o.   MRN: [id number]    HPI The patient presents today to discuss fatigue and palpitations. Historically he had atrial flutter in the past that spontaneously converted to sinus. He is anticoagulated. He has a history of resting sinus bradycardia. He has coronary disease with an intervention in the past. Follow-up nuclear study in 2010 revealed no ischemia. Follow-up 2-D echo in 2012 showed normal LV function.  Today he presents and describes to me the fact that he had a prolonged upper respiratory infection. Eventually he was given inhalers. He then noted increased palpitations. When he learned that the inhalers could affect the palpitations, he stopped the inhalers. He has recovered from his pulmonary illness. However he continues to feel poorly in general. He feels vague palpitations. When he feels these, it can cause him to sit around all day doing nothing. He does admit that his home situation is very stressful due to family interactions. He has not had any marked chest pain.  Allergies  Allergen Reactions  . Hydrocod Polst-Cpm Polst Er     REACTION: hives  . Albuterol Palpitations  . Qvar [Beclomethasone] Palpitations    Current Outpatient Prescriptions  Medication Sig Dispense Refill  . ALPRAZolam (XANAX) 0.5 MG tablet Take 1/2 to 1 tablet by mouth three times daily as needed for anxiety 90 tablet 2  . atorvastatin (LIPITOR) 80 MG tablet Take 1 tablet (80 mg total) by mouth daily. 90 tablet 1  . carisoprodol (SOMA) 350 MG tablet Take 350 mg by mouth daily as needed for muscle spasms.    . Cholecalciferol (VITAMIN D) 2000 UNITS CAPS Take 1 capsule by mouth daily.    Marland Kitchen diltiazem (CARDIZEM CD) 180 MG 24 hr capsule Take 1 capsule (180 mg total) by mouth daily. 90 capsule 1  . etodolac (LODINE) 400 MG tablet Take 1 tablet (400 mg total) by mouth 2 (two) times daily. 60 tablet 6  . fenofibrate 160 MG tablet take one tablet by mouth  once daily 90 tablet 1  . glucose blood test strip Use as instructed 100 each 12  . HYDROcodone-acetaminophen (NORCO/VICODIN) 5-325 MG per tablet Take 1 tablet by mouth 3 (three) times daily as needed. (Patient taking differently: Take 1 tablet by mouth 3 (three) times daily as needed for moderate pain. ) 90 tablet 0  . Lancets (ONETOUCH ULTRASOFT) lancets Use as instructed 100 each 12  . metoprolol succinate (TOPROL-XL) 50 MG 24 hr tablet Take 1 tablet (50 mg total) by mouth daily. Take with or immediately following a meal. 90 tablet 1  . Multiple Vitamin (MULTIVITAMIN) tablet Take 1 tablet by mouth daily.      Marland Kitchen omeprazole (PRILOSEC) 20 MG capsule Take 1 capsule (20 mg total) by mouth daily. 90 capsule 1  . rivaroxaban (XARELTO) 20 MG TABS tablet Take 1 tablet (20 mg total) by mouth daily. 90 tablet 1  . tamsulosin (FLOMAX) 0.4 MG CAPS capsule Take 1 capsule (0.4 mg total) by mouth daily after supper. 90 capsule 1  . vitamin B-12 (CYANOCOBALAMIN) 500 MCG tablet Take 500 mcg by mouth daily.       No current facility-administered medications for this visit.    History   Social History  . Marital Status: Married    Spouse Name: N/A    Number of Children: 0  . Years of Education: N/A   Occupational History  . [employer]    Social History Main Topics  .  Smoking status: Current Every Day Smoker -- 0.50 packs/day    Types: Cigarettes  . Smokeless tobacco: Never Used     Comment: smokes about 1/2 ppd  . Alcohol Use: 2.5 oz/week    5 drink(s) per week  . Drug Use: Not on file  . Sexual Activity: Not on file   Other Topics Concern  . Not on file   Social History Narrative   Lives w/ wife ,no children of his own,   wife's daughter-Gk live w/ them    Family History  Problem Relation Age of Onset  . Stroke Mother   . Alzheimer's disease Mother     Jerilynn Mages, brother   . Heart failure Father   . Stroke Father   . Colon cancer Neg Hx   . Prostate cancer      Past Medical History    Diagnosis Date  . Pulmonary nodule   . HTN (hypertension)   . CAD (coronary artery disease)     stent, 2001 / nuclear, June, 2010, no ischemia  . PAF (paroxysmal atrial fibrillation)   . Dyslipidemia   . Obesity   . Diverticulosis of colon   . History of colonic polyps   . BPH (benign prostatic hypertrophy)   . Testosterone deficiency   . ED (erectile dysfunction)   . Lumbar back pain   . Peripheral neuropathy     lower extremities  . Anxiety and depression   . Vitamin D deficiency   . Blurred vision     episode with confusion, evaluated Dr. Erling Cruz  . Ejection fraction   . B12 deficiency   . Scalp lesion     November, 2011  . Atrial flutter     New diagnosis November 10, 2010, rate controlled    Past Surgical History  Procedure Laterality Date  . Tonsillectomy    . Clavicle surgery      remotely  . Arm fracture surgery      remotely    Patient Active Problem List   Diagnosis Date Noted  . Dyslipidemia     Priority: High  . CIGARETTE SMOKER 07/23/2007    Priority: High  . Annual physical exam 04/28/2014  . Prediabetes 04/28/2014  . Wheezing 03/28/2014  . Sinusitis, acute maxillary 03/28/2014  . Ejection fraction   . HX: anticoagulation 02/14/2012  . GERD (gastroesophageal reflux disease) 02/11/2011  . Atrial flutter   . HTN (hypertension)   . CAD (coronary artery disease)   . Obesity   . B12 deficiency   . PAF (paroxysmal atrial fibrillation)   . VITAMIN D DEFICIENCY 05/20/2009  . Peripheral neuropathy 09/10/2008  . BACK PAIN, LUMBAR 08/03/2008  . ERECTILE DYSFUNCTION 02/09/2008  . BPH (benign prostatic hyperplasia) 02/09/2008  . COLONIC POLYPS 07/23/2007  . Solitary pulmonary nodule 07/23/2007  . DIVERTICULOSIS OF COLON 07/23/2007  . ANXIETY DEPRESSION 07/08/2007    ROS  Patient denies fever, chills, headache, sweats, rash, change in vision, change in hearing, chest pain, cough, nausea vomiting, urinary symptoms. He says that he might since a very slight  wheeze at times. All other systems are reviewed and are negative.  PHYSICAL EXAM Patient is oriented to person time and place. Affect is normal. Head is atraumatic. Sclera and conjunctiva are normal. There is no jugular venous distention. Lungs are clear. Respiratory effort is nonlabored. Cardiac exam reveals S1 and S2. Abdomen is soft. There is no peripheral edema. There are no musculoskeletal deformities. There are no skin rashes. Neurologic is grossly intact.  Filed Vitals:   06/18/14 1549  BP: 94/54  Pulse: 51  Height: 6\' 6"  (1.981 m)  Weight: 281 lb (127.461 kg)  SpO2: 95%   EKG is done today and reviewed by me. He has decreased anterior R-wave which is unchanged. He has sinus bradycardia with a rate of 51.  ASSESSMENT & PLAN

## 2014-06-19 NOTE — Assessment & Plan Note (Signed)
His rhythm remained sinus at this time. I cannot yet tell if he is going in and out of atrial flutter. We will place a 21 day event recorder. This will help both with ruling out atrial arrhythmias and also making sure that he is not having marked bradycardia arrhythmias.

## 2014-06-19 NOTE — Assessment & Plan Note (Signed)
He has known coronary disease. His last stress study was 2010. LV function was normal by echo in 2012. At this point I'm not convinced that his symptoms are ischemic in origin. At some point it may be appropriate to proceed with follow-up stress study however

## 2014-06-19 NOTE — Assessment & Plan Note (Signed)
The patient admits that he is very stressed with his home family situation. I believe that this does play some role in his overall sense of well-being.

## 2014-06-20 ENCOUNTER — Encounter: Payer: Self-pay | Admitting: Gastroenterology

## 2014-06-21 DIAGNOSIS — H2513 Age-related nuclear cataract, bilateral: Secondary | ICD-10-CM | POA: Diagnosis not present

## 2014-06-21 DIAGNOSIS — H35363 Drusen (degenerative) of macula, bilateral: Secondary | ICD-10-CM | POA: Diagnosis not present

## 2014-06-21 DIAGNOSIS — H524 Presbyopia: Secondary | ICD-10-CM | POA: Diagnosis not present

## 2014-06-25 ENCOUNTER — Ambulatory Visit (INDEPENDENT_AMBULATORY_CARE_PROVIDER_SITE_OTHER): Payer: Medicare Other | Admitting: Gastroenterology

## 2014-06-25 ENCOUNTER — Encounter: Payer: Self-pay | Admitting: Gastroenterology

## 2014-06-25 ENCOUNTER — Telehealth: Payer: Self-pay

## 2014-06-25 VITALS — BP 110/70 | HR 70 | Ht 78.0 in | Wt 283.0 lb

## 2014-06-25 DIAGNOSIS — Z86010 Personal history of colonic polyps: Secondary | ICD-10-CM | POA: Diagnosis not present

## 2014-06-25 DIAGNOSIS — Z1211 Encounter for screening for malignant neoplasm of colon: Secondary | ICD-10-CM

## 2014-06-25 DIAGNOSIS — I2510 Atherosclerotic heart disease of native coronary artery without angina pectoris: Secondary | ICD-10-CM

## 2014-06-25 MED ORDER — MOVIPREP 100 G PO SOLR: 1.0000 | Freq: Once | ORAL | Status: DC

## 2014-06-25 NOTE — Progress Notes (Signed)
Reviewed and agree with management plan.  Payten Beaumier T. Creston Klas, MD FACG 

## 2014-06-25 NOTE — Progress Notes (Signed)
06/25/2014 Brendan [name] [id number] [birth year]   HISTORY OF PRESENT ILLNESS:  This is a 64 year old male who is previously known to Dr. Fuller Plan for colonoscopy in 07/2004 at which time he was found to have diverticulosis, internal hemorrhoids, and colon polyps removed.  I do not have the pathology report for the polyps.  He is here today to discuss a repeat colonoscopy.  He denies any GI complaints or issues.  He is on Xarelto that is prescribed by Dr. Ron Parker for atrial fibrillation/atrial flutter.   Past Medical History  Diagnosis Date  . Pulmonary nodule   . HTN (hypertension)   . CAD (coronary artery disease)     stent, 2001 / nuclear, June, 2010, no ischemia  . PAF (paroxysmal atrial fibrillation)   . Dyslipidemia   . Obesity   . Diverticulosis of colon   . History of colonic polyps   . BPH (benign prostatic hypertrophy)   . Testosterone deficiency   . ED (erectile dysfunction)   . Lumbar back pain   . Peripheral neuropathy     lower extremities  . Anxiety and depression   . Vitamin D deficiency   . Blurred vision     episode with confusion, evaluated Dr. Erling Cruz  . Ejection fraction   . B12 deficiency   . Scalp lesion     November, 2011  . Atrial flutter     New diagnosis November 10, 2010, rate controlled  . Tobacco use   . GERD (gastroesophageal reflux disease)   . Prediabetes   . Sinus bradycardia    Past Surgical History  Procedure Laterality Date  . Tonsillectomy    . Clavicle surgery      remotely  . Arm fracture surgery      remotely    reports that he has been smoking Cigarettes.  He has been smoking about 0.50 packs per day. He has never used smokeless tobacco. He reports that he drinks about 2.5 oz of alcohol per week. His drug history is not on file. family history includes Alzheimer's disease in his mother; Heart failure in his father; Prostate cancer in an other family member; Stroke in his father and mother. There is no history of Colon  cancer. Allergies  Allergen Reactions  . Hydrocod Polst-Cpm Polst Er     REACTION: hives  . Albuterol Palpitations  . Qvar [Beclomethasone] Palpitations      Outpatient Encounter Prescriptions as of 06/25/2014  Medication Sig  . ALPRAZolam (XANAX) 0.5 MG tablet Take 1/2 to 1 tablet by mouth three times daily as needed for anxiety  . atorvastatin (LIPITOR) 80 MG tablet Take 1 tablet (80 mg total) by mouth daily.  . carisoprodol (SOMA) 350 MG tablet Take 350 mg by mouth daily as needed for muscle spasms.  . Cholecalciferol (VITAMIN D) 2000 UNITS CAPS Take 1 capsule by mouth daily.  Marland Kitchen diltiazem (CARDIZEM CD) 180 MG 24 hr capsule Take 1 capsule (180 mg total) by mouth daily.  Marland Kitchen etodolac (LODINE) 400 MG tablet Take 1 tablet (400 mg total) by mouth 2 (two) times daily.  . fenofibrate 160 MG tablet take one tablet by mouth once daily  . glucose blood test strip Use as instructed  . HYDROcodone-acetaminophen (NORCO/VICODIN) 5-325 MG per tablet Take 1 tablet by mouth 3 (three) times daily as needed. (Patient taking differently: Take 1 tablet by mouth 3 (three) times daily as needed for moderate pain. )  . Lancets (ONETOUCH ULTRASOFT) lancets Use as  instructed  . metoprolol succinate (TOPROL-XL) 50 MG 24 hr tablet Take 1 tablet (50 mg total) by mouth daily. Take with or immediately following a meal.  . Multiple Vitamin (MULTIVITAMIN) tablet Take 1 tablet by mouth daily.    Marland Kitchen omeprazole (PRILOSEC) 20 MG capsule Take 1 capsule (20 mg total) by mouth daily.  . rivaroxaban (XARELTO) 20 MG TABS tablet Take 1 tablet (20 mg total) by mouth daily.  . tamsulosin (FLOMAX) 0.4 MG CAPS capsule Take 1 capsule (0.4 mg total) by mouth daily after supper.  . vitamin B-12 (CYANOCOBALAMIN) 500 MCG tablet Take 500 mcg by mouth daily.    Marland Kitchen MOVIPREP 100 G SOLR Take 1 kit (200 g total) by mouth once.     REVIEW OF SYSTEMS  : All other systems reviewed and negative except where noted in the History of Present  Illness.   PHYSICAL EXAM: BP 110/70 mmHg  Pulse 70  Ht 6' 6"  (1.981 m)  Wt 283 lb (128.368 kg)  BMI 32.71 kg/m2  SpO2 97% General: Well developed white male in no acute distress Head: Normocephalic and atraumatic Eyes:  Sclerae anicteric, conjunctiva pink. Ears: Normal auditory acuity Lungs: Clear throughout to auscultation Heart: Regular rate and rhythm Abdomen: Soft, non-distended.  Normal bowel sounds.  Non-tender. Rectal:  Will be done at the time of colonoscopy. Musculoskeletal: Symmetrical with no gross deformities  Skin: No lesions on visible extremities Extremities: No edema  Neurological: Alert oriented x 4, grossly non-focal Psychological:  Alert and cooperative. Normal mood and affect  ASSESSMENT AND PLAN: -Screening colonoscopy:  Last colonoscopy 07/2014.  Will schedule with Dr. Fuller Plan.  The risks, benefits, and alternatives were discussed with the patient and he consents to proceed. -Chronic anticoagulation on Xarelto:  The risks benefits and alternatives to a temporary hold of anti-coagulants/anti-platelets for the procedure were discussed with the patient he consents to proceed. Obtain clearance from Dr. Ron Parker for ok to hold Xarelto prior to procedure.

## 2014-06-25 NOTE — Telephone Encounter (Signed)
  06/25/2014   RE: HAMISH BANKS DOB: 09-Sep-1950 MRN: [id number]   Dear Ron Parker,    We have scheduled the above patient for an endoscopic procedure. Our records show that he is on anticoagulation therapy.   Please advise as to how long the patient may come off his therapy of Xeralto prior to the procedure, which is scheduled for 07/26/2014.  Please fax back/ or route the completed form to [place] at [phone].   Sincerely,    Phillis Haggis

## 2014-06-25 NOTE — Patient Instructions (Signed)

## 2014-07-01 NOTE — Telephone Encounter (Signed)
He should stop Xarelto 4 days before the procedure. He can resume the day after the procedure.

## 2014-07-02 NOTE — Telephone Encounter (Signed)
Left message on vm regarding holding Xarelto for 4 days prior to procedure per Dr. Ron Parker.  Asked that patient call me back and leave a message that he had received and understood the message.

## 2014-07-04 ENCOUNTER — Encounter: Payer: Self-pay | Admitting: Gastroenterology

## 2014-07-06 ENCOUNTER — Telehealth: Payer: Self-pay | Admitting: Cardiology

## 2014-07-06 NOTE — Telephone Encounter (Signed)
Pt calling the office to ask Dr Ron Parker if he has enough data gathered from his event monitor, so he can remove this all together.  Pt states that he had minor skin irritation but nothing concerning, that would suggest an allergic reaction.  Pt states he had his monitor placed on 1-11, and is due for a follow-up OV with Dr Ron Parker next Thursday 2/4.  Pt states he has felt no cardiac events since the monitor was placed on, on 1-11.  Informed the pt that I spoke with our monitor tech Katie, and per Joellen Jersey the pt can take the monitor off for 2 days and contact the monitor company to have them send him sensitive electrodes for the skin irritation.  Pt states he was already advised to do this, and refused the suggestion.  Pt states he really just wants the monitor off his body all together, for he states its "cumbersome." Advised the pt to continue wearing the monitor until he see's Dr Ron Parker next Thursday, and then Dr Ron Parker could review the data collected thus far, and make the decision whether the pt should continue wearing this or not.  Informed the pt that I will also send Dr Ron Parker and covering nurse this message for further review and recommendation, and someone will follow-up thereafter.  Pt voiced no other complaints at this time.  Pt verbalized understanding and agrees with this plan.

## 2014-07-06 NOTE — Telephone Encounter (Signed)
Pt wants to take heart monitor off, was put on 1-11 and to take off 2-4, causing some whelps and has had few events, pushed the button only one time, see's Ron Parker 07-12-14, pls advise

## 2014-07-06 NOTE — Telephone Encounter (Signed)
Thank you for the good work with this patient. You can call and let him know that it is okay to take the monitor off. Please be sure that the monitor company knows that it is coming off so the charge does not continue.

## 2014-07-09 NOTE — Telephone Encounter (Signed)
**Note De-Identified [place] [place] Obfuscation** The pt is advised and he verbalized understanding. Will forward note to Joellen Jersey and [place], our monitor techs, to contact the monitor company.

## 2014-07-09 NOTE — Telephone Encounter (Signed)
Spoke with patient who clarified he understood to hold his Xarelto for 4 day prior to his procedure.

## 2014-07-12 ENCOUNTER — Encounter: Payer: Self-pay | Admitting: Cardiology

## 2014-07-12 ENCOUNTER — Ambulatory Visit (INDEPENDENT_AMBULATORY_CARE_PROVIDER_SITE_OTHER): Payer: Medicare Other | Admitting: Cardiology

## 2014-07-12 VITALS — BP 140/70 | HR 59 | Ht 78.0 in | Wt 284.4 lb

## 2014-07-12 DIAGNOSIS — R001 Bradycardia, unspecified: Secondary | ICD-10-CM

## 2014-07-12 DIAGNOSIS — I4892 Unspecified atrial flutter: Secondary | ICD-10-CM

## 2014-07-12 DIAGNOSIS — Z7901 Long term (current) use of anticoagulants: Secondary | ICD-10-CM

## 2014-07-12 DIAGNOSIS — I2510 Atherosclerotic heart disease of native coronary artery without angina pectoris: Secondary | ICD-10-CM

## 2014-07-12 DIAGNOSIS — Z9229 Personal history of other drug therapy: Secondary | ICD-10-CM

## 2014-07-12 NOTE — Assessment & Plan Note (Signed)
The event recorder shows that the patient does have infrequent episodes of atrial flutter or atrial fib. The rate is controlled. The episodes were infrequent. One episode did last for an hour. The patient was not symptomatic at that time. He is anticoagulated. He does not have clearcut symptoms. There is no marked bradycardia or tachycardia. I've chosen not to change his meds.

## 2014-07-12 NOTE — Assessment & Plan Note (Signed)
There is no evidence that sinus bradycardia is causing any of his current problems.

## 2014-07-12 NOTE — Assessment & Plan Note (Signed)
Coronary disease is stable. I feel he does not need exercise testing at this time.

## 2014-07-12 NOTE — Assessment & Plan Note (Signed)
Anticoagulation will be continued.

## 2014-07-12 NOTE — Patient Instructions (Signed)
Your physician recommends that you continue on your current medications as directed. Please refer to the Current Medication list given to you today.  Your physician wants you to follow-up in: October, 2016. You will receive a reminder letter in the mail two months in advance. If you don't receive a letter, please call our office to schedule the follow-up appointment.

## 2014-07-12 NOTE — Progress Notes (Signed)
Cardiology Office Note   Date:  07/12/2014   ID:  [name], Brendan [name], MRN [id number]  PCP:  [name], MD  Cardiologist:  Dola Argyle, MD   Chief Complaint  Patient presents with  . Appointment    Follow-up atrial arrhythmias      History of Present Illness: Brendan [name] is a 64 y.o. male who presents to follow-up atrial arrhythmias. I saw him last June 18, 2014. At that time he had multiple vague symptoms. There was question that he could be having some significant atrial arrhythmias. An event recorder was placed. I personally reviewed the data. He does have atrial arrhythmias in the range of 2% of the time. It is not completely clear to me if it is atrial fib or atrial flutter. However the rate is definitely controlled. He did not have any significant symptoms. There was no marked bradycardia or tachycardia.  He is stable today.    Past Medical History  Diagnosis Date  . Pulmonary nodule   . HTN (hypertension)   . CAD (coronary artery disease)     stent, 2001 / nuclear, June, 2010, no ischemia  . PAF (paroxysmal atrial fibrillation)   . Dyslipidemia   . Obesity   . Diverticulosis of colon   . History of colonic polyps   . BPH (benign prostatic hypertrophy)   . Testosterone deficiency   . ED (erectile dysfunction)   . Lumbar back pain   . Peripheral neuropathy     lower extremities  . Anxiety and depression   . Vitamin D deficiency   . Blurred vision     episode with confusion, evaluated Dr. Erling Cruz  . Ejection fraction   . B12 deficiency   . Scalp lesion     November, 2011  . Atrial flutter     New diagnosis November 10, 2010, rate controlled  . Tobacco use   . GERD (gastroesophageal reflux disease)   . Prediabetes   . Sinus bradycardia     Past Surgical History  Procedure Laterality Date  . Tonsillectomy    . Clavicle surgery      remotely  . Arm fracture surgery      remotely    Patient Active Problem List   Diagnosis Date Noted  .  Dyslipidemia     Priority: High  . CIGARETTE SMOKER 07/23/2007    Priority: High  . Special screening for malignant neoplasms, colon 06/25/2014  . Hx of colonic polyps 06/25/2014  . Sinus bradycardia 06/19/2014  . Annual physical exam 04/28/2014  . Prediabetes 04/28/2014  . Wheezing 03/28/2014  . Sinusitis, acute maxillary 03/28/2014  . Ejection fraction   . HX: anticoagulation 02/14/2012  . GERD (gastroesophageal reflux disease) 02/11/2011  . Atrial flutter   . HTN (hypertension)   . CAD (coronary artery disease)   . Obesity   . B12 deficiency   . PAF (paroxysmal atrial fibrillation)   . VITAMIN D DEFICIENCY 05/20/2009  . Peripheral neuropathy 09/10/2008  . BACK PAIN, LUMBAR 08/03/2008  . ERECTILE DYSFUNCTION 02/09/2008  . BPH (benign prostatic hyperplasia) 02/09/2008  . COLONIC POLYPS 07/23/2007  . Solitary pulmonary nodule 07/23/2007  . DIVERTICULOSIS OF COLON 07/23/2007  . ANXIETY DEPRESSION 07/08/2007      Current Outpatient Prescriptions  Medication Sig Dispense Refill  . ALPRAZolam (XANAX) 0.5 MG tablet Take 1/2 to 1 tablet by mouth three times daily as needed for anxiety 90 tablet 2  . atorvastatin (LIPITOR) 80 MG tablet Take 1 tablet (  80 mg total) by mouth daily. 90 tablet 1  . carisoprodol (SOMA) 350 MG tablet Take 350 mg by mouth daily as needed for muscle spasms.    . Cholecalciferol (VITAMIN D) 2000 UNITS CAPS Take 1 capsule by mouth daily.    Marland Kitchen diltiazem (CARDIZEM CD) 180 MG 24 hr capsule Take 1 capsule (180 mg total) by mouth daily. 90 capsule 1  . etodolac (LODINE) 400 MG tablet Take 1 tablet (400 mg total) by mouth 2 (two) times daily. 60 tablet 6  . fenofibrate 160 MG tablet take one tablet by mouth once daily 90 tablet 1  . glucose blood test strip Use as instructed 100 each 12  . HYDROcodone-acetaminophen (NORCO/VICODIN) 5-325 MG per tablet Take 1 tablet by mouth 3 (three) times daily as needed. (Patient taking differently: Take 1 tablet by mouth 3  (three) times daily as needed for moderate pain. ) 90 tablet 0  . Lancets (ONETOUCH ULTRASOFT) lancets Use as instructed 100 each 12  . metoprolol succinate (TOPROL-XL) 50 MG 24 hr tablet Take 1 tablet (50 mg total) by mouth daily. Take with or immediately following a meal. 90 tablet 1  . MOVIPREP 100 G SOLR Take 1 kit (200 g total) by mouth once. 1 kit 0  . Multiple Vitamin (MULTIVITAMIN) tablet Take 1 tablet by mouth daily.      Marland Kitchen omeprazole (PRILOSEC) 20 MG capsule Take 1 capsule (20 mg total) by mouth daily. 90 capsule 1  . rivaroxaban (XARELTO) 20 MG TABS tablet Take 1 tablet (20 mg total) by mouth daily. 90 tablet 1  . tamsulosin (FLOMAX) 0.4 MG CAPS capsule Take 1 capsule (0.4 mg total) by mouth daily after supper. 90 capsule 1  . vitamin B-12 (CYANOCOBALAMIN) 500 MCG tablet Take 500 mcg by mouth daily.       No current facility-administered medications for this visit.    Allergies:   Hydrocod polst-cpm polst er; Albuterol; and Qvar    Social History:  The patient  reports that he has been smoking Cigarettes.  He has been smoking about 0.50 packs per day. He has never used smokeless tobacco. He reports that he drinks about 2.5 oz of alcohol per week.   Family History:  The patient's family history includes Alzheimer's disease in his mother; Heart failure in his father; Prostate cancer in an other family member; Stroke in his father and mother. There is no history of Colon cancer.    ROS:  Please see the history of present illness. Patient denies fever, chills, headache, sweats, rash, change in vision, change in hearing, chest pain, cough, nausea or vomiting, urinary symptoms. All other systems are reviewed and are negative.       PHYSICAL EXAM: VS:  BP 140/70 mmHg  Pulse 59  Ht 6' 6"  (1.981 m)  Wt 284 lb 6.4 oz (129.003 kg)  BMI 32.87 kg/m2 , Patient is oriented to person time and place. Affect is normal. Head is atraumatic. Sclera and conjunctiva are normal per there is no  jugular venous distention. Lungs are clear. Respiratory effort is nonlabored. Cardiac exam reveals S1 and S2. Abdomen is soft. There is no peripheral edema.  EKG:   EKG is not done today. I have reviewed the event recorder completely.   Recent Labs: 12/25/2013: Hemoglobin 15.3; Platelets 243.0 04/06/2014: ALT 23 04/27/2014: BUN 26*; Creatinine 1.2; Potassium 5.3*; Sodium 141    Lipid Panel    Component Value Date/Time   CHOL 169 04/06/2014 0837   TRIG 138.0  04/06/2014 0837   HDL 33.30* 04/06/2014 0837   CHOLHDL 5 04/06/2014 0837   VLDL 27.6 04/06/2014 0837   LDLCALC 108* 04/06/2014 0837   LDLDIRECT 107.6 05/09/2010 0908      Wt Readings from Last 3 Encounters:  07/12/14 284 lb 6.4 oz (129.003 kg)  06/25/14 283 lb (128.368 kg)  06/18/14 281 lb (127.461 kg)      Current medicines are reviewed.  He understands his meds and no changes will be made.       ASSESSMENT AND PLAN:

## 2014-07-18 ENCOUNTER — Other Ambulatory Visit: Payer: Self-pay | Admitting: Internal Medicine

## 2014-07-18 ENCOUNTER — Encounter: Payer: Self-pay | Admitting: Internal Medicine

## 2014-07-18 NOTE — Telephone Encounter (Signed)
Please print two prescriptions, to be refill 07-2014 and 08-2014. #90 no refills.

## 2014-07-18 NOTE — Telephone Encounter (Signed)
Pt is requesting refill on Hydrocodone.   Last OV: 04/27/2014 Last Fill: 06/06/2014 #90 0RF UDS: 04/27/2014 Low risk  Please advise.

## 2014-07-19 MED ORDER — HYDROCODONE-ACETAMINOPHEN 5-325 MG PO TABS: 1.0000 | ORAL_TABLET | Freq: Three times a day (TID) | ORAL | Status: DC | PRN

## 2014-07-19 NOTE — Telephone Encounter (Signed)
Spoke with Pt, informed Pt Rx is ready for pick up at front desk. Pt verbalized understanding.

## 2014-07-19 NOTE — Telephone Encounter (Signed)
Printed, awaiting Dr. Larose Kells signature.

## 2014-07-20 ENCOUNTER — Telehealth: Payer: Self-pay | Admitting: Internal Medicine

## 2014-07-20 ENCOUNTER — Encounter: Payer: Self-pay | Admitting: Gastroenterology

## 2014-07-20 NOTE — Telephone Encounter (Signed)
Caller name:  everette from [facility] Relation to pt:  Call back number: [phone] Pharmacy:  Reason for call:   Please call [facility] regarding hydrocodone rx.

## 2014-07-20 NOTE — Telephone Encounter (Signed)
Spoke with Everette with [facility]. He informed me that someone told the Pt that it would be illegal for him to change pharmacies and have his Hydrocodone filled. However, I informed Everette that I was unsure of who told the Pt that. Only that IF the Pt has a controlled substance contract he would have to let us know about the change in pharmacy or sign a new contract, however, I did not see a contract in Pts chart. Informed Everette it would be okay to fill prescriptions.

## 2014-07-26 ENCOUNTER — Encounter: Payer: Self-pay | Admitting: Gastroenterology

## 2014-07-26 ENCOUNTER — Ambulatory Visit (AMBULATORY_SURGERY_CENTER): Payer: Medicare Other | Admitting: Gastroenterology

## 2014-07-26 VITALS — BP 115/84 | HR 48 | Temp 96.5°F | Resp 12 | Ht 78.0 in | Wt 283.0 lb

## 2014-07-26 DIAGNOSIS — E119 Type 2 diabetes mellitus without complications: Secondary | ICD-10-CM | POA: Diagnosis not present

## 2014-07-26 DIAGNOSIS — Z1211 Encounter for screening for malignant neoplasm of colon: Secondary | ICD-10-CM

## 2014-07-26 DIAGNOSIS — I2510 Atherosclerotic heart disease of native coronary artery without angina pectoris: Secondary | ICD-10-CM | POA: Diagnosis not present

## 2014-07-26 MED ORDER — FLEET ENEMA 7-19 GM/118ML RE ENEM
1.0000 | ENEMA | RECTAL | Status: AC
  Administered 2014-07-26: 1 via RECTAL

## 2014-07-26 MED ORDER — SODIUM CHLORIDE 0.9 % IV SOLN: 500.0000 mL | INTRAVENOUS | Status: DC

## 2014-07-26 NOTE — Progress Notes (Signed)
Pt's last result was yellow cloudy liquid.  maw

## 2014-07-26 NOTE — Progress Notes (Signed)
A/ox3 pleased with MAC, report to Arpril RN

## 2014-07-26 NOTE — Op Note (Signed)
[facility]  [address]. [place], [address]   COLONOSCOPY PROCEDURE REPORT  PATIENT: Brendan [name], Brendan [name]  MR#: [id number] BIRTHDATE: February 16, 1951 , 61  yrs. old GENDER: male ENDOSCOPIST: Ladene Artist, MD, [facility] PROCEDURE DATE:  07/26/2014 PROCEDURE:   Colonoscopy, screening First Screening Colonoscopy - Avg.  risk and is 50 yrs.  old or older - No.  Prior Negative Screening - Now for repeat screening. 10 or more years since last screening  History of Adenoma - Now for follow-up colonoscopy & has been > or = to 3 yrs.  N/A  Polyps Removed Today? No.  Polyps Removed Today? No.  Recommend repeat exam, <10 yrs? Polyps Removed Today? No.  Recommend repeat exam, <10 yrs? No. ASA CLASS:   Class III INDICATIONS:average risk patient for colorectal cancer. MEDICATIONS: Monitored anesthesia care and Propofol 280 mg IV DESCRIPTION OF PROCEDURE:   After the risks benefits and alternatives of the procedure were thoroughly explained, informed consent was obtained.  The digital rectal exam revealed no abnormalities of the rectum.   The LB [id number]  endoscope was introduced through the anus and advanced to the cecum, which was identified by both the appendix and ileocecal valve. No adverse events experienced.   The quality of the prep was adequate, using MoviPrep  The instrument was then slowly withdrawn as the colon was fully examined.    COLON FINDINGS: There was mild diverticulosis noted in the sigmoid colon.   The examination was otherwise normal.  Retroflexed views revealed internal Grade I hemorrhoids. The time to cecum=4 minutes 20 seconds.  Withdrawal time=11 minutes 41 seconds.  The scope was withdrawn and the procedure completed. COMPLICATIONS: There were no immediate complications.  ENDOSCOPIC IMPRESSION: 1.   Mild diverticulosis in the sigmoid colon 2.   Grade l internal hemorrhoids  RECOMMENDATIONS: 1.  High fiber diet with liberal fluid intake. 2.   You should continue to follow colorectal cancer screening guidelines for "routine risk" patients with a repeat colonoscopy in 10 years.  There is no need for routine, screening FOBT (stool) testing for at least 5 years. 3.  Resume Xarelto tomorrow  eSigned:  Ladene Artist, MD, [facility] 07/26/2014 11:46 AM

## 2014-07-26 NOTE — Patient Instructions (Signed)

## 2014-07-26 NOTE — Progress Notes (Signed)
Pt reported light brown liquid last result.  Waldo Laine, RN reported to Dr. Fuller Plan.  Vocal order of enema x1.  I gave the pt instructions on administering fleet enema.  He will pull the call bell for [place] to see results when the pt is finished. maw

## 2014-07-27 ENCOUNTER — Telehealth: Payer: Self-pay | Admitting: *Deleted

## 2014-07-27 NOTE — Telephone Encounter (Signed)
  Follow up Call-  Call back number 07/26/2014  Post procedure Call Back phone  # [phone] cell  Permission to leave phone message Yes     Patient questions:  Do you have a fever, pain , or abdominal swelling? No. Pain Score  0 *  Have you tolerated food without any problems? Yes.    Have you been able to return to your normal activities? Yes.    Do you have any questions about your discharge instructions: Diet   No. Medications  No. Follow up visit  No.  Do you have questions or concerns about your Care? No.  Actions: * If pain score is 4 or above: No action needed, pain <4.

## 2014-07-30 ENCOUNTER — Other Ambulatory Visit: Payer: Self-pay

## 2014-07-30 MED ORDER — ETODOLAC 400 MG PO TABS: 400.0000 mg | ORAL_TABLET | Freq: Two times a day (BID) | ORAL | Status: DC

## 2014-08-03 ENCOUNTER — Ambulatory Visit (INDEPENDENT_AMBULATORY_CARE_PROVIDER_SITE_OTHER): Payer: Medicare Other | Admitting: Internal Medicine

## 2014-08-03 ENCOUNTER — Encounter: Payer: Self-pay | Admitting: Internal Medicine

## 2014-08-03 VITALS — BP 120/76 | HR 68 | Temp 98.1°F | Ht 78.0 in | Wt 281.0 lb

## 2014-08-03 DIAGNOSIS — R0683 Snoring: Secondary | ICD-10-CM

## 2014-08-03 DIAGNOSIS — I2510 Atherosclerotic heart disease of native coronary artery without angina pectoris: Secondary | ICD-10-CM | POA: Diagnosis not present

## 2014-08-03 NOTE — Progress Notes (Signed)
Subjective:    Patient ID: Brendan [name], male    DOB: [birth year], 64 y.o.   MRN: [id number]  DOS:  08/03/2014 Type of visit - description : To discuss  snoring Interval history: Long history of loud snoring, he actually cannot sleep in the same bedroom with his wife. He is ready for treatment. No actual fatigue but he admits is very sleepy  Review of Systems Denies chest pain, difficulty breathing or palpitations. No nausea, vomiting, diarrhea  Past Medical History  Diagnosis Date  . Pulmonary nodule   . HTN (hypertension)   . CAD (coronary artery disease)     stent, 2001 / nuclear, June, 2010, no ischemia  . PAF (paroxysmal atrial fibrillation)   . Dyslipidemia   . Obesity   . Diverticulosis of colon   . History of colonic polyps   . BPH (benign prostatic hypertrophy)   . Testosterone deficiency   . ED (erectile dysfunction)   . Lumbar back pain   . Peripheral neuropathy     lower extremities  . Anxiety and depression   . Vitamin D deficiency   . Blurred vision     episode with confusion, evaluated Dr. Erling Cruz  . Ejection fraction   . B12 deficiency   . Scalp lesion     November, 2011  . Atrial flutter     New diagnosis November 10, 2010, rate controlled  . Tobacco use   . Prediabetes   . Sinus bradycardia   . Allergy   . Anxiety   . Arthritis   . GERD (gastroesophageal reflux disease)     pt denies, take omeprazole d/t taking several medications not GERD per pt  . Hyperlipidemia   . Stroke     tia    Past Surgical History  Procedure Laterality Date  . Tonsillectomy    . Clavicle surgery      remotely  . Arm fracture surgery      remotely  . Colonoscopy    . Cardica stent x2      History   Social History  . Marital Status: Married    Spouse Name: N/A  . Number of Children: 0  . Years of Education: N/A   Occupational History  . [employer]    Social History Main Topics  . Smoking status: Current Every Day Smoker -- 0.50 packs/day    Types:  Cigarettes  . Smokeless tobacco: Never Used     Comment: smokes about 1/2 ppd  . Alcohol Use: 2.5 oz/week    5 Standard drinks or equivalent per week     Comment: rare  . Drug Use: No  . Sexual Activity: Not on file   Other Topics Concern  . Not on file   Social History Narrative   Lives w/ wife ,no children of his own,   wife's daughter-Gk live w/ them        Medication List       This list is accurate as of: 08/03/14 11:59 PM.  Always use your most recent med list.               ALPRAZolam 0.5 MG tablet  Commonly known as:  XANAX  Take 1/2 to 1 tablet by mouth three times daily as needed for anxiety     atorvastatin 80 MG tablet  Commonly known as:  LIPITOR  Take 1 tablet (80 mg total) by mouth daily.     carisoprodol 350 MG tablet  Commonly known as:  SOMA  Take 350 mg by mouth daily as needed for muscle spasms.     diltiazem 180 MG 24 hr capsule  Commonly known as:  CARDIZEM CD  Take 1 capsule (180 mg total) by mouth daily.     etodolac 400 MG tablet  Commonly known as:  LODINE  Take 1 tablet (400 mg total) by mouth 2 (two) times daily.     fenofibrate 160 MG tablet  take one tablet by mouth once daily     glucose blood test strip  Use as instructed     HYDROcodone-acetaminophen 5-325 MG per tablet  Commonly known as:  NORCO/VICODIN  Take 1 tablet by mouth 3 (three) times daily as needed.     metoprolol succinate 50 MG 24 hr tablet  Commonly known as:  TOPROL-XL  Take 1 tablet (50 mg total) by mouth daily. Take with or immediately following a meal.     multivitamin tablet  Take 1 tablet by mouth daily.     omeprazole 20 MG capsule  Commonly known as:  PRILOSEC  Take 1 capsule (20 mg total) by mouth daily.     onetouch ultrasoft lancets  Use as instructed     rivaroxaban 20 MG Tabs tablet  Commonly known as:  XARELTO  Take 1 tablet (20 mg total) by mouth daily.     tamsulosin 0.4 MG Caps capsule  Commonly known as:  FLOMAX  Take 1 capsule  (0.4 mg total) by mouth daily after supper.     vitamin B-12 500 MCG tablet  Commonly known as:  CYANOCOBALAMIN  Take 500 mcg by mouth daily.     Vitamin D 2000 UNITS Caps  Take 1 capsule by mouth daily.           Objective:   Physical Exam BP 120/76 mmHg  Pulse 68  Temp(Src) 98.1 F (36.7 C) (Oral)  Ht 6\' 6"  (1.981 m)  Wt 281 lb (127.461 kg)  BMI 32.48 kg/m2  SpO2 99% General:   Well developed, well nourished . NAD.  HEENT:  Normocephalic . Face symmetric, atraumatic Neck symmetric, no mass or LAD Throat crowded. Lungs:  CTA B Normal respiratory effort, no intercostal retractions, no accessory muscle use. Heart: RRR,  no murmur.  Muscle skeletal: no pretibial edema bilaterally  Skin: Not pale. Not jaundice Neurologic:  alert & oriented X3.  Speech normal, gait appropriate for age and unassisted Psych--  Cognition and judgment appear intact.  Cooperative with normal attention span and concentration.  Behavior appropriate. No anxious or depressed appearing.       Assessment & Plan:

## 2014-08-03 NOTE — Progress Notes (Signed)
Pre visit review using our clinic review tool, if applicable. No additional management support is needed unless otherwise documented below in the visit note. 

## 2014-08-03 NOTE — Patient Instructions (Signed)
Will refer you to  to pulmonary medicine

## 2014-08-03 NOTE — Assessment & Plan Note (Signed)
Long history of snoring, no fatigue but Epworth sleepiness scale score 9 which is positive. Refer to pulmonary, likely will benefit from a sleep study

## 2014-08-06 ENCOUNTER — Encounter: Payer: Self-pay | Admitting: Internal Medicine

## 2014-08-07 DIAGNOSIS — H6123 Impacted cerumen, bilateral: Secondary | ICD-10-CM | POA: Diagnosis not present

## 2014-08-07 DIAGNOSIS — H903 Sensorineural hearing loss, bilateral: Secondary | ICD-10-CM | POA: Diagnosis not present

## 2014-08-07 DIAGNOSIS — H9313 Tinnitus, bilateral: Secondary | ICD-10-CM | POA: Diagnosis not present

## 2014-08-13 DIAGNOSIS — N5201 Erectile dysfunction due to arterial insufficiency: Secondary | ICD-10-CM | POA: Diagnosis not present

## 2014-08-13 DIAGNOSIS — N401 Enlarged prostate with lower urinary tract symptoms: Secondary | ICD-10-CM | POA: Diagnosis not present

## 2014-08-13 DIAGNOSIS — N3941 Urge incontinence: Secondary | ICD-10-CM | POA: Diagnosis not present

## 2014-08-13 DIAGNOSIS — N138 Other obstructive and reflux uropathy: Secondary | ICD-10-CM | POA: Diagnosis not present

## 2014-08-22 ENCOUNTER — Other Ambulatory Visit: Payer: Self-pay | Admitting: Internal Medicine

## 2014-08-22 ENCOUNTER — Telehealth: Payer: Self-pay | Admitting: *Deleted

## 2014-08-22 NOTE — Telephone Encounter (Signed)
Pt is requesting refill on Soma.  Last OV: 08/06/2014 Last Fill: 01/23/2014 # 31 5RF UDS: 04/27/2014 Low risk  Please advise.

## 2014-08-22 NOTE — Telephone Encounter (Signed)
Faxed to [facility].

## 2014-08-22 NOTE — Telephone Encounter (Signed)
Ok 90 and 5 Rf

## 2014-08-22 NOTE — Telephone Encounter (Signed)
Rx printed, awaiting signature by Dr. Paz.  

## 2014-08-22 NOTE — Telephone Encounter (Signed)
Prior authorization for carisoprodol (Soma) intiatated. Awaiting determination. JG//CMA

## 2014-08-24 NOTE — Telephone Encounter (Signed)
PA denied. Appeal started. JG//CMA

## 2014-08-24 NOTE — Telephone Encounter (Signed)
Additional information faxed to [place]. JG//CMA

## 2014-08-27 ENCOUNTER — Encounter: Payer: Self-pay | Admitting: Internal Medicine

## 2014-08-27 ENCOUNTER — Ambulatory Visit (INDEPENDENT_AMBULATORY_CARE_PROVIDER_SITE_OTHER): Payer: Medicare Other | Admitting: Internal Medicine

## 2014-08-27 VITALS — BP 124/76 | HR 83 | Temp 98.0°F | Ht 78.0 in | Wt 279.5 lb

## 2014-08-27 DIAGNOSIS — E785 Hyperlipidemia, unspecified: Secondary | ICD-10-CM | POA: Diagnosis not present

## 2014-08-27 DIAGNOSIS — I2510 Atherosclerotic heart disease of native coronary artery without angina pectoris: Secondary | ICD-10-CM

## 2014-08-27 DIAGNOSIS — R911 Solitary pulmonary nodule: Secondary | ICD-10-CM

## 2014-08-27 DIAGNOSIS — G629 Polyneuropathy, unspecified: Secondary | ICD-10-CM | POA: Diagnosis not present

## 2014-08-27 DIAGNOSIS — N400 Enlarged prostate without lower urinary tract symptoms: Secondary | ICD-10-CM | POA: Diagnosis not present

## 2014-08-27 NOTE — Progress Notes (Signed)
Pre visit review using our clinic review tool, if applicable. No additional management support is needed unless otherwise documented below in the visit note. 

## 2014-08-27 NOTE — Assessment & Plan Note (Signed)
Chest x-ray 11-2015unchanged, no need for further routine x-rays

## 2014-08-27 NOTE — Patient Instructions (Signed)
Decrease etodolac to one tablet daily for 2 weeks, then 1 every other day for 2 weeks, then stop, okay to take 1 tablet sporadically.  Next visit for a complete physical 4 months from today, please make an appointment

## 2014-08-27 NOTE — Assessment & Plan Note (Signed)
not optimally controlled, reports he plans to do better with diet and exercise in the next few months. Will recheck his FLP on return to the office

## 2014-08-27 NOTE — Assessment & Plan Note (Addendum)
Taking etodolac as part of the regimen to treat neuropathy, he is anticoagulated, will gradually decrease etodolac, hopefully symptoms will remain well-controlled without NSAIDs

## 2014-08-27 NOTE — Progress Notes (Signed)
Subjective:    Patient ID: Brendan [name], male    DOB: Mar 07, 1951, 64 y.o.   MRN: [id number]  DOS:  08/27/2014 Type of visit - description : rov Interval history:  Since her last routine visit, had chest x-ray which was stable. Saw urology, twice, medication was adjusted, doing slightly better. Also, he has been taking etodolac twice a day for a while, does not recall having any particular benefit from it.   Review of Systems Denies chest pain or difficulty breathing. No palpitations No nausea, vomiting, diarrhea. No blood in the stools.   Past Medical History  Diagnosis Date  . Pulmonary nodule   . HTN (hypertension)   . CAD (coronary artery disease)     stent, 2001 / nuclear, June, 2010, no ischemia  . PAF (paroxysmal atrial fibrillation)   . Dyslipidemia   . Obesity   . Diverticulosis of colon   . History of colonic polyps   . BPH (benign prostatic hypertrophy)   . Testosterone deficiency   . ED (erectile dysfunction)   . Lumbar back pain   . Peripheral neuropathy     lower extremities  . Anxiety and depression   . Vitamin D deficiency   . Blurred vision     episode with confusion, evaluated Dr. Erling Cruz  . Ejection fraction   . B12 deficiency   . Scalp lesion     November, 2011  . Atrial flutter     New diagnosis November 10, 2010, rate controlled  . Tobacco use   . Prediabetes   . Sinus bradycardia   . Allergy   . Anxiety   . Arthritis   . GERD (gastroesophageal reflux disease)     pt denies, take omeprazole d/t taking several medications not GERD per pt  . Hyperlipidemia   . Stroke     tia    Past Surgical History  Procedure Laterality Date  . Tonsillectomy    . Clavicle surgery      remotely  . Arm fracture surgery      remotely  . Colonoscopy    . Cardica stent x2      History   Social History  . Marital Status: Married    Spouse Name: N/A  . Number of Children: 0  . Years of Education: N/A   Occupational History  . [employer]    Social  History Main Topics  . Smoking status: Current Every Day Smoker -- 0.50 packs/day    Types: Cigarettes  . Smokeless tobacco: Never Used     Comment: smokes about 1/2 ppd  . Alcohol Use: 2.5 oz/week    5 Standard drinks or equivalent per week     Comment: rare  . Drug Use: No  . Sexual Activity: Not on file   Other Topics Concern  . Not on file   Social History Narrative   Lives w/ wife ,no children of his own,   wife's daughter-Gk live w/ them        Medication List       This list is accurate as of: 08/27/14  5:18 PM.  Always use your most recent med list.               ALPRAZolam 0.5 MG tablet  Commonly known as:  XANAX  Take 1/2 to 1 tablet by mouth three times daily as needed for anxiety     atorvastatin 80 MG tablet  Commonly known as:  LIPITOR  Take 1 tablet (  80 mg total) by mouth daily.     carisoprodol 350 MG tablet  Commonly known as:  SOMA  TAKE ONE (1) TABLET THREE (3) TIMES EACHDAY AS NEEDED FOR MUSCLE SPASMS     diltiazem 180 MG 24 hr capsule  Commonly known as:  CARDIZEM CD  Take 1 capsule (180 mg total) by mouth daily.     etodolac 400 MG tablet  Commonly known as:  LODINE  Take 1 tablet (400 mg total) by mouth 2 (two) times daily.     fenofibrate 160 MG tablet  take one tablet by mouth once daily     glucose blood test strip  Use as instructed     HYDROcodone-acetaminophen 5-325 MG per tablet  Commonly known as:  NORCO/VICODIN  Take 1 tablet by mouth 3 (three) times daily as needed.     metoprolol succinate 50 MG 24 hr tablet  Commonly known as:  TOPROL-XL  Take 1 tablet (50 mg total) by mouth daily. Take with or immediately following a meal.     multivitamin tablet  Take 1 tablet by mouth daily.     omeprazole 20 MG capsule  Commonly known as:  PRILOSEC  Take 1 capsule (20 mg total) by mouth daily.     onetouch ultrasoft lancets  Use as instructed     rivaroxaban 20 MG Tabs tablet  Commonly known as:  XARELTO  Take 1 tablet  (20 mg total) by mouth daily.     tolterodine 4 MG 24 hr capsule  Commonly known as:  DETROL LA  Take 4 mg by mouth daily.     vitamin B-12 500 MCG tablet  Commonly known as:  CYANOCOBALAMIN  Take 1,000 mcg by mouth daily.     Vitamin D 2000 UNITS Caps  Take 1 capsule by mouth daily.           Objective:   Physical Exam BP 124/76 mmHg  Pulse 83  Temp(Src) 98 F (36.7 C) (Oral)  Ht 6\' 6"  (1.981 m)  Wt 279 lb 8 oz (126.78 kg)  BMI 32.31 kg/m2  SpO2 97%  General:   Well developed, well nourished . NAD.  HEENT:  Normocephalic . Face symmetric, atraumatic Lungs:  CTA B Normal respiratory effort, no intercostal retractions, no accessory muscle use. Heart: RRR,  no murmur.  Muscle skeletal: no pretibial edema bilaterally  Skin: Not pale. Not jaundice Neurologic:  alert & oriented X3.  Speech normal, gait appropriate for age and unassisted Psych--  Cognition and judgment appear intact.  Cooperative with normal attention span and concentration.  Behavior appropriate. No anxious or depressed appearing.     Assessment & Plan:

## 2014-08-27 NOTE — Assessment & Plan Note (Signed)
Saw urology, Flomax discontinue, now on Detrol, slightly better?

## 2014-09-13 ENCOUNTER — Other Ambulatory Visit: Payer: Self-pay | Admitting: Internal Medicine

## 2014-09-13 NOTE — Telephone Encounter (Signed)
Rx faxed.    KP 

## 2014-09-13 NOTE — Telephone Encounter (Signed)
Refill x1 

## 2014-09-13 NOTE — Telephone Encounter (Signed)
Pt is requesting refill on Alprazolam.  Last OV: 08/27/2014 Last Fill: 06/04/2014 # 90 2RF UDS: 04/27/2014 Low risk  Please advise.

## 2014-09-21 ENCOUNTER — Other Ambulatory Visit: Payer: Self-pay | Admitting: Internal Medicine

## 2014-09-24 NOTE — Telephone Encounter (Signed)
Late entry:  Appeal approved effective 08/23/2014 through 08/23/2015. JG//CMA

## 2014-10-01 ENCOUNTER — Institutional Professional Consult (permissible substitution): Payer: Medicare Other | Admitting: Pulmonary Disease

## 2014-10-01 ENCOUNTER — Telehealth: Payer: Self-pay | Admitting: *Deleted

## 2014-10-01 NOTE — Telephone Encounter (Signed)
Pt dropped off paperwork needed for his disability benefits through [employer]. Forms filled out as much as possible and forwarded to Dr. Larose Kells. JG//CMA

## 2014-10-04 DIAGNOSIS — Z7689 Persons encountering health services in other specified circumstances: Secondary | ICD-10-CM

## 2014-10-10 ENCOUNTER — Encounter: Payer: Self-pay | Admitting: Cardiology

## 2014-10-11 NOTE — Telephone Encounter (Signed)
Patient calling in regarding this. Wanting to know the status. Best fax # to fax paperwork to is [phone]. Attn:[employer] benefits center Best # [phone] to reach patient.

## 2014-10-11 NOTE — Telephone Encounter (Signed)
Patient reports he has been in full disability; he has a number of issues including CAD, atrial flutter, severe neuropathy. Paperwork fill to the best of my knowledge.

## 2014-10-12 ENCOUNTER — Telehealth: Payer: Self-pay

## 2014-10-12 NOTE — Telephone Encounter (Signed)
The pt is advised and he verbalized understanding. He expressed appreciation towards Dr Ron Parker for his quick response to the question.

## 2014-10-12 NOTE — Telephone Encounter (Signed)
Forms faxed to [facility] successfully. JG//CMA

## 2014-10-12 NOTE — Telephone Encounter (Signed)
**Note De-Identified Brendan [name] Obfuscation** Please call the patient and tell him the following: Dr. Ron Parker is aware of your concerns about Alzheimer's, neuropathy, possible association with diabetes. None of these have been proven. It is possible that there may be some association with diabetes. However, the proven benefit of statins in patients with proven coronary artery disease is very substantial and very well proven. Therefore his recommendation is to continue the statin if you are willing.     ----- Message -----     From: Antonieta Iba, RN     Sent: 10/11/2014  8:57 AM      To: Carlena Bjornstad, MD    Subject: FW: Non-Urgent Medical Question             No answer and no way to leave a message. Will continue to call.

## 2014-10-12 NOTE — Telephone Encounter (Signed)
Called to inform pt, received message that his voicemail box is not set up.

## 2014-10-13 ENCOUNTER — Other Ambulatory Visit: Payer: Self-pay | Admitting: Family Medicine

## 2014-10-15 NOTE — Telephone Encounter (Signed)
Ok 90 and 1 RF 

## 2014-10-15 NOTE — Telephone Encounter (Signed)
Faxed to [facility].

## 2014-10-15 NOTE — Telephone Encounter (Signed)
Pt is requesting refill on Alprazolam.  Last OV: 08/27/2014 Last Fill: 09/13/2014 # 90 0 RF UDS: 04/27/2014 Low risk  Please advise.

## 2014-10-17 ENCOUNTER — Telehealth: Payer: Self-pay | Admitting: *Deleted

## 2014-10-17 NOTE — Telephone Encounter (Signed)
Signed medical release received via mail from [place] requesting all records. Forwarded to [name] to scan/email to medical records. JG//CMA

## 2014-10-29 DIAGNOSIS — M2040 Other hammer toe(s) (acquired), unspecified foot: Secondary | ICD-10-CM

## 2014-11-07 ENCOUNTER — Other Ambulatory Visit: Payer: Self-pay | Admitting: Internal Medicine

## 2014-11-09 ENCOUNTER — Telehealth: Payer: Self-pay | Admitting: Internal Medicine

## 2014-11-09 MED ORDER — HYDROCODONE-ACETAMINOPHEN 5-325 MG PO TABS: 1.0000 | ORAL_TABLET | Freq: Three times a day (TID) | ORAL | Status: DC | PRN

## 2014-11-09 NOTE — Telephone Encounter (Signed)
He is due if he wants a refill.

## 2014-11-09 NOTE — Telephone Encounter (Signed)
Rx's printed for June and July 2016, awaiting MD signature.

## 2014-11-09 NOTE — Telephone Encounter (Signed)
Yes

## 2014-11-09 NOTE — Telephone Encounter (Signed)
Informed patient of this.  °

## 2014-11-09 NOTE — Telephone Encounter (Signed)
Rx placed at front desk for pick-up. Can you please inform Pt that he may pick-up Rx at his convenience. Thank you.

## 2014-11-09 NOTE — Telephone Encounter (Signed)
Is he due or not due for this?

## 2014-11-09 NOTE — Telephone Encounter (Signed)
Hydrocodone was last written on 07/19/2014 #90 tablets and 0 RF, for March 2016.

## 2014-11-09 NOTE — Telephone Encounter (Signed)
90 , 2 prescriptions

## 2014-11-09 NOTE — Telephone Encounter (Signed)
Caller name: Kendra Relation to pt: self Call back number: [phone] Pharmacy:  Reason for call:   Patient states that the pharmacy has lost his hydrocodone rx.  Wants to know when we wrote last rx.

## 2014-11-09 NOTE — Telephone Encounter (Signed)
Pt is requesting refill on Hydrocodone.  Last OV: 08/27/2014 Last Fill: 07/19/2014 #90 0RF UDS: 04/27/2014 Low risk  Please advise.

## 2014-11-19 ENCOUNTER — Telehealth: Payer: Self-pay | Admitting: Internal Medicine

## 2014-11-19 NOTE — Telephone Encounter (Signed)
Please advise 

## 2014-11-19 NOTE — Telephone Encounter (Signed)
He is doing well, start using the patch. I don't know if he will be able to order additional patches but he can get it over-the-counter.

## 2014-11-19 NOTE — Telephone Encounter (Signed)
LMOM informing Pt that per Dr. Larose Kells, okay for him to start nicotine patches. Informed him that Dr. Larose Kells is unsure if he can get more patches ordered through 1-800-QUIT-NOW, but that some nicotine patches are over the counter, which I instructed I believe they are 8 mg and 4 mg patches OTC. Informed him that he will want to slowly start to taper down on the dosage of the patch. Instructed him to return call if he has any other questions.

## 2014-11-19 NOTE — Telephone Encounter (Signed)
Caller name: Math Brazie Relationship to patient: self Can be reached: [phone] Pharmacy:  Reason for call: Pt called 1-800-QUIT-NOW. They sent him a 2 weeks supply of patches to quit smoking. He is wanting advice before placing the first patch. He wants to make sure it is safe and to found out if he can get additional patches ordered if he is doing well with it. He states they sent 21mg  patches to him. Today is his quit day - so far no cigarette today!!! Please call pt to advise.

## 2014-11-22 ENCOUNTER — Encounter: Payer: Self-pay | Admitting: Pulmonary Disease

## 2014-11-22 ENCOUNTER — Ambulatory Visit (INDEPENDENT_AMBULATORY_CARE_PROVIDER_SITE_OTHER): Payer: Medicare Other | Admitting: Pulmonary Disease

## 2014-11-22 VITALS — BP 110/72 | HR 60 | Temp 97.8°F | Ht 78.0 in | Wt 274.0 lb

## 2014-11-22 DIAGNOSIS — Z720 Tobacco use: Secondary | ICD-10-CM

## 2014-11-22 DIAGNOSIS — I2510 Atherosclerotic heart disease of native coronary artery without angina pectoris: Secondary | ICD-10-CM | POA: Diagnosis not present

## 2014-11-22 DIAGNOSIS — R0683 Snoring: Secondary | ICD-10-CM | POA: Diagnosis not present

## 2014-11-22 DIAGNOSIS — F17200 Nicotine dependence, unspecified, uncomplicated: Secondary | ICD-10-CM

## 2014-11-22 NOTE — Addendum Note (Signed)
Addended by: Rigoberto Noel on: 11/22/2014 04:55 PM   Modules accepted: Level of Service

## 2014-11-22 NOTE — Assessment & Plan Note (Signed)
I congratulated him on his quit attempt. He is on 21 mg patch, can taper down to 14 mg in a few weeks would continue this for a total of 3 months and finally taper down to 7 mg patch- Spirometry does not show airway obstruction on the use this to motivate him to remain quit

## 2014-11-22 NOTE — Progress Notes (Signed)
Subjective:    Patient ID: Brendan [name], male    DOB: 11-09-1950, 64 y.o.   MRN: [id number]  HPI 64 year old smoker presents for evaluation of sleep-disordered breathing. He smoked half pack per day for 40 years-about 20 pack years. He called the quit line and quit 3 days ago using a nicotine 21 mg patch.  He has CAD & PAF followed by Benay Spice on rivaroxaban  Spirometry surprisingly does not show any evidence of airway obstruction-ratio was 81 with FEV1 91% and FVC of 85% on best effort  He reports loud snoring especially when on his back. His wife has muscular dystrophy and sleeps in a different room for last 2 years. He denies gasping or choking episodes or witnessed apneas. He has occasionally woken up by his own snoring. Epworth sleepiness score is 9. He is a retired for more than a year after his wife's illness and cannot comment on excessive daytime fatigue. Bedtime is around 11 PM sleep latency is minimal, he sleeps on his side with 2 pillows, reports occasional nocturia with 1-2 spontaneous awakenings and is out of bed by 7:30 AM feeling tired with occasional dryness of mouth but denies headaches. His weight has stayed constant over the last 2 years    Past Medical History  Diagnosis Date  . Pulmonary nodule   . HTN (hypertension)   . CAD (coronary artery disease)     stent, 2001 / nuclear, June, 2010, no ischemia  . PAF (paroxysmal atrial fibrillation)   . Dyslipidemia   . Obesity   . Diverticulosis of colon   . History of colonic polyps   . BPH (benign prostatic hypertrophy)   . Testosterone deficiency   . ED (erectile dysfunction)   . Lumbar back pain   . Peripheral neuropathy     lower extremities  . Anxiety and depression   . Vitamin D deficiency   . Blurred vision     episode with confusion, evaluated Dr. Erling Cruz  . Ejection fraction   . B12 deficiency   . Scalp lesion     November, 2011  . Atrial flutter     New diagnosis November 10, 2010, rate controlled  .  Tobacco use   . Prediabetes   . Sinus bradycardia   . Allergy   . Anxiety   . Arthritis   . GERD (gastroesophageal reflux disease)     pt denies, take omeprazole d/t taking several medications not GERD per pt  . Hyperlipidemia   . Stroke     tia    Past Surgical History  Procedure Laterality Date  . Tonsillectomy    . Clavicle surgery      remotely  . Arm fracture surgery      remotely  . Colonoscopy    . Cardica stent x2      Allergies  Allergen Reactions  . Tussionex Pennkinetic Er [Hydrocod Polst-Cpm Polst Er] Hives  . Albuterol Palpitations  . Qvar [Beclomethasone] Palpitations    History   Social History  . Marital Status: Married    Spouse Name: N/A  . Number of Children: 0  . Years of Education: N/A   Occupational History  . [employer]    Social History Main Topics  . Smoking status: Former Smoker -- 0.50 packs/day for 40 years    Types: Cigarettes    Quit date: 11/19/2014  . Smokeless tobacco: Never Used     Comment: using Nicotine patches  . Alcohol Use: 3.0 oz/week  5 Standard drinks or equivalent per week     Comment: rare  . Drug Use: No  . Sexual Activity: Not on file   Other Topics Concern  . Not on file   Social History Narrative   Lives w/ wife ,no children of his own,   wife's daughter-Gk live w/ them    Family History  Problem Relation Age of Onset  . Stroke Mother   . Alzheimer's disease Mother     Jerilynn Mages, brother   . Heart failure Father   . Stroke Father   . Colon cancer Neg Hx   . Esophageal cancer Neg Hx   . Rectal cancer Neg Hx   . Stomach cancer Neg Hx   . Prostate cancer       Review of Systems  Constitutional: Negative for fever, chills, activity change, appetite change and unexpected weight change.  HENT: Negative for congestion, dental problem, postnasal drip, rhinorrhea, sneezing, sore throat, trouble swallowing and voice change.   Eyes: Negative for visual disturbance.  Respiratory: Negative for cough, choking  and shortness of breath.   Cardiovascular: Negative for chest pain and leg swelling.  Gastrointestinal: Negative for nausea, vomiting and abdominal pain.  Genitourinary: Negative for difficulty urinating.  Musculoskeletal: Negative for arthralgias.  Skin: Negative for rash.  Psychiatric/Behavioral: Negative for behavioral problems and confusion.       Objective:   Physical Exam  Gen. Pleasant, obese, in no distress, normal affect ENT - no lesions, no post nasal drip, class 2-3 airway Neck: No JVD, no thyromegaly, no carotid bruits Lungs: no use of accessory muscles, no dullness to percussion, decreased without rales or rhonchi  Cardiovascular: Rhythm regular, heart sounds  normal, no murmurs or gallops, no peripheral edema Abdomen: soft and non-tender, no hepatosplenomegaly, BS normal. Musculoskeletal: No deformities, no cyanosis or clubbing Neuro:  alert, non focal, no tremors       Assessment & Plan:

## 2014-11-22 NOTE — Patient Instructions (Signed)
Home sleep study 

## 2014-11-22 NOTE — Assessment & Plan Note (Signed)
Given excessive daytime somnolence, narrow pharyngeal exam, witnessed apneas & loud snoring, obstructive sleep apnea is very likely & an overnight polysomnogram will be scheduled as a home study. The pathophysiology of obstructive sleep apnea , it's cardiovascular consequences & modes of treatment including CPAP were discused with the patient in detail & they evidenced understanding. Pre test prob is intermediate

## 2014-12-04 ENCOUNTER — Telehealth: Payer: Self-pay | Admitting: *Deleted

## 2014-12-04 NOTE — Telephone Encounter (Signed)
Pt states he would like a refill of a medication given for his toe by Dr. Paulla Dolly.  He is having a similar problem with a different toe.  I explained to pt the medication was not in our computer and he had not been seen since 10/2013 and would need to be evaluated for a new problem.  I transferred pt to schedulers.

## 2014-12-05 ENCOUNTER — Encounter: Payer: Self-pay | Admitting: Podiatry

## 2014-12-05 ENCOUNTER — Ambulatory Visit (INDEPENDENT_AMBULATORY_CARE_PROVIDER_SITE_OTHER): Payer: Medicare Other | Admitting: Podiatry

## 2014-12-05 ENCOUNTER — Ambulatory Visit (INDEPENDENT_AMBULATORY_CARE_PROVIDER_SITE_OTHER): Payer: Medicare Other

## 2014-12-05 VITALS — BP 137/84 | HR 55 | Resp 15

## 2014-12-05 DIAGNOSIS — S90821A Blister (nonthermal), right foot, initial encounter: Secondary | ICD-10-CM

## 2014-12-05 DIAGNOSIS — L089 Local infection of the skin and subcutaneous tissue, unspecified: Secondary | ICD-10-CM

## 2014-12-05 DIAGNOSIS — M79674 Pain in right toe(s): Secondary | ICD-10-CM

## 2014-12-05 DIAGNOSIS — I2510 Atherosclerotic heart disease of native coronary artery without angina pectoris: Secondary | ICD-10-CM

## 2014-12-05 DIAGNOSIS — M2041 Other hammer toe(s) (acquired), right foot: Secondary | ICD-10-CM

## 2014-12-05 MED ORDER — CEPHALEXIN 500 MG PO CAPS: 500.0000 mg | ORAL_CAPSULE | Freq: Three times a day (TID) | ORAL | Status: DC

## 2014-12-06 NOTE — Progress Notes (Signed)
Subjective:     Patient ID: Brendan [name], male   DOB: June 07, 1951, 64 y.o.   MRN: [id number]  HPI patient presents after not being seen for a long time with distal keratotic lesion digit 2 right with slight breakdown of tissue localized in nature and also keratotic lesions on the right big toe. He states the left is doing fine we treated last year   Review of Systems     Objective:   Physical Exam Neurovascular status unchanged with severe cavus deformity with rigid digital deformities and distal keratotic lesion secondary to pressure. No proximal edema erythema or drainage is noted and is localized to the distal tip of the second toe with mild breakdown of tissue of the localized nature and no drainage noted or indications of advanced disease    Assessment:     Severe structural deformities of the feet creating pressure against the lesser digits leading to keratotic lesion and mild ulceration    Plan:     Educated him on condition and debridement accomplished today with Silvadene application along with padding of the second digit right. I did place on antibiotics as a precautionary measure cephalexin 500 mg twice a day and I went ahead and dispensed a buttress pads for each forefoot keep pressure off the lesser digits. This should heal uneventfully and is given strict instructions to come in if there should be any redness drainage odor and to go to the emergency room if there should be any proximal edema erythema drainage or if he should develop any systemic signs of infection

## 2014-12-11 ENCOUNTER — Telehealth: Payer: Self-pay | Admitting: Internal Medicine

## 2014-12-11 NOTE — Telephone Encounter (Signed)
Pre visit letter mailed

## 2014-12-12 ENCOUNTER — Telehealth: Payer: Self-pay | Admitting: Pulmonary Disease

## 2014-12-12 NOTE — Telephone Encounter (Signed)
Patient calling to inquire on his sleep study.  Ordered 11/22/14.   [facility] - do you know how far back we are on the HST so I can let patient's know?

## 2014-12-13 NOTE — Telephone Encounter (Signed)
ATC PT NA. VM not set up yet. WCB

## 2014-12-13 NOTE — Telephone Encounter (Signed)
Someone can call him in the next week Joellen Jersey

## 2014-12-14 ENCOUNTER — Telehealth: Payer: Self-pay | Admitting: Internal Medicine

## 2014-12-14 ENCOUNTER — Other Ambulatory Visit: Payer: Self-pay | Admitting: Family Medicine

## 2014-12-14 MED ORDER — ALPRAZOLAM 0.5 MG PO TABS: 0.2500 mg | ORAL_TABLET | Freq: Three times a day (TID) | ORAL | Status: DC | PRN

## 2014-12-14 NOTE — Telephone Encounter (Signed)
Attempted to call pt. No answer, voicemail is not set up. Will try back.

## 2014-12-14 NOTE — Telephone Encounter (Signed)
Notified pt. 

## 2014-12-14 NOTE — Telephone Encounter (Signed)
Please inform Pt that I have faxed his Alprazolam prescription to [facility] for a 90 day supply. Etodolac was refilled on 09/21/2014 #180 tablets and 1 refill (90 day supply), he should only be taking 1 tablet twice daily. If taking as prescribed he should have enough until October 2016. Also, he will need to contact his Urologist for refills on Tolterodine. He may discuss having Dr. Larose Kells manage this medication at his next OV. Thank you.

## 2014-12-14 NOTE — Telephone Encounter (Signed)
Refill request already sent to Dr. Larose Kells, awaiting approval.

## 2014-12-14 NOTE — Telephone Encounter (Signed)
Alprazolam Rx printed, awaiting MD signature.  

## 2014-12-14 NOTE — Telephone Encounter (Signed)
Caller name: Brendan [name] Relationship to patient: self Can be reached: [facility]  Reason for call: pt will be out of Xanax tomorrow. Asking for 90 day supply. Pt was informed by pharmacy that is unlikely to be filled for 90 days. Pt asking for refills on RX as well.  Pt needing etodalac 400mg . Has 1 week on hand. Wants 90 day supply with refills. Pt asking if Dr. Larose Kells will refill Tolterodine. It was originally prescribed by Urology. He wanted to get this med 90 day supply as well if Dr. Larose Kells will fill. Pt said that he has refills but only 30 day supply.

## 2014-12-14 NOTE — Telephone Encounter (Signed)
Pt is requesting refill on Alprazolam, and Etodolac. Pt is requesting a 90 day supply.  Last OV: 08/27/2014 Last Fill on Alprazolam: 10/15/2014 #90 1RF-Pt can take 1/2-1 tablet three times daily PRN Last Fill on Etodolac: 09/21/2014 #180 1RF   Pt also requesting refill on Tolterodine, originally prescribed by Urology, but would like for you to refill.   Please advise.

## 2014-12-14 NOTE — Telephone Encounter (Signed)
Okay to try alprazolam # 270, no refills Too early to refill etodolac, has enough until October

## 2014-12-17 NOTE — Telephone Encounter (Signed)
ATC PT NA WCB 

## 2014-12-18 NOTE — Telephone Encounter (Signed)
Called the patient and he is coming in on 12/20/2014 @ 2:30 to pick up HST

## 2014-12-20 DIAGNOSIS — G4733 Obstructive sleep apnea (adult) (pediatric): Secondary | ICD-10-CM | POA: Diagnosis not present

## 2014-12-21 ENCOUNTER — Telehealth: Payer: Self-pay | Admitting: Pulmonary Disease

## 2014-12-21 DIAGNOSIS — G4733 Obstructive sleep apnea (adult) (pediatric): Secondary | ICD-10-CM

## 2014-12-21 NOTE — Telephone Encounter (Signed)
Last OV : 11/22/14 AHI 15/hr Needs CPAP Titration Study

## 2014-12-21 NOTE — Telephone Encounter (Signed)
Patient notified. CPAP Titration Study entered Nothing further needed.

## 2014-12-22 DIAGNOSIS — G4730 Sleep apnea, unspecified: Secondary | ICD-10-CM | POA: Diagnosis not present

## 2014-12-22 DIAGNOSIS — G4733 Obstructive sleep apnea (adult) (pediatric): Secondary | ICD-10-CM | POA: Diagnosis not present

## 2014-12-25 ENCOUNTER — Other Ambulatory Visit: Payer: Self-pay | Admitting: *Deleted

## 2014-12-25 DIAGNOSIS — R0683 Snoring: Secondary | ICD-10-CM

## 2014-12-27 ENCOUNTER — Encounter: Payer: Self-pay | Admitting: *Deleted

## 2014-12-27 ENCOUNTER — Telehealth: Payer: Self-pay | Admitting: *Deleted

## 2014-12-27 NOTE — Telephone Encounter (Signed)
Pre-Visit Call completed with patient and chart updated.   Pre-Visit Info documented in Specialty Comments under [employer].    

## 2014-12-28 ENCOUNTER — Encounter: Payer: Self-pay | Admitting: Internal Medicine

## 2014-12-28 ENCOUNTER — Ambulatory Visit (INDEPENDENT_AMBULATORY_CARE_PROVIDER_SITE_OTHER): Payer: Medicare Other | Admitting: Internal Medicine

## 2014-12-28 VITALS — BP 128/84 | HR 53 | Temp 97.4°F | Ht 78.0 in | Wt 276.2 lb

## 2014-12-28 DIAGNOSIS — Z0000 Encounter for general adult medical examination without abnormal findings: Secondary | ICD-10-CM | POA: Diagnosis not present

## 2014-12-28 DIAGNOSIS — F341 Dysthymic disorder: Secondary | ICD-10-CM

## 2014-12-28 DIAGNOSIS — R7303 Prediabetes: Secondary | ICD-10-CM

## 2014-12-28 DIAGNOSIS — I2510 Atherosclerotic heart disease of native coronary artery without angina pectoris: Secondary | ICD-10-CM | POA: Diagnosis not present

## 2014-12-28 DIAGNOSIS — E785 Hyperlipidemia, unspecified: Secondary | ICD-10-CM

## 2014-12-28 DIAGNOSIS — N400 Enlarged prostate without lower urinary tract symptoms: Secondary | ICD-10-CM | POA: Diagnosis not present

## 2014-12-28 DIAGNOSIS — I10 Essential (primary) hypertension: Secondary | ICD-10-CM

## 2014-12-28 DIAGNOSIS — R7309 Other abnormal glucose: Secondary | ICD-10-CM | POA: Diagnosis not present

## 2014-12-28 DIAGNOSIS — E538 Deficiency of other specified B group vitamins: Secondary | ICD-10-CM

## 2014-12-28 DIAGNOSIS — Z79891 Long term (current) use of opiate analgesic: Secondary | ICD-10-CM | POA: Diagnosis not present

## 2014-12-28 DIAGNOSIS — M545 Low back pain, unspecified: Secondary | ICD-10-CM

## 2014-12-28 DIAGNOSIS — Z79899 Other long term (current) drug therapy: Secondary | ICD-10-CM | POA: Diagnosis not present

## 2014-12-28 LAB — PSA: PSA: 0.51 ng/mL (ref 0.10–4.00)

## 2014-12-28 LAB — CBC WITH DIFFERENTIAL/PLATELET
Basophils Absolute: 0 10*3/uL (ref 0.0–0.1)
Basophils Relative: 0.6 % (ref 0.0–3.0)
EOS ABS: 0.2 10*3/uL (ref 0.0–0.7)
Eosinophils Relative: 3.2 % (ref 0.0–5.0)
HCT: 44.2 % (ref 39.0–52.0)
HEMOGLOBIN: 15 g/dL (ref 13.0–17.0)
LYMPHS ABS: 1.9 10*3/uL (ref 0.7–4.0)
LYMPHS PCT: 37.2 % (ref 12.0–46.0)
MCHC: 33.9 g/dL (ref 30.0–36.0)
MCV: 92.8 fl (ref 78.0–100.0)
MONO ABS: 0.4 10*3/uL (ref 0.1–1.0)
MONOS PCT: 7.3 % (ref 3.0–12.0)
NEUTROS PCT: 51.7 % (ref 43.0–77.0)
Neutro Abs: 2.7 10*3/uL (ref 1.4–7.7)
PLATELETS: 233 10*3/uL (ref 150.0–400.0)
RBC: 4.76 Mil/uL (ref 4.22–5.81)
RDW: 13.1 % (ref 11.5–15.5)
WBC: 5.2 10*3/uL (ref 4.0–10.5)

## 2014-12-28 LAB — LIPID PANEL
CHOL/HDL RATIO: 5
Cholesterol: 164 mg/dL (ref 0–200)
HDL: 36.2 mg/dL — AB (ref >39.00)
LDL Cholesterol: 103 mg/dL — ABNORMAL HIGH (ref 0–99)
NONHDL: 127.8
TRIGLYCERIDES: 123 mg/dL (ref 0.0–149.0)
VLDL: 24.6 mg/dL (ref 0.0–40.0)

## 2014-12-28 LAB — BASIC METABOLIC PANEL
BUN: 26 mg/dL — ABNORMAL HIGH (ref 6–23)
CALCIUM: 9.5 mg/dL (ref 8.4–10.5)
CO2: 26 mEq/L (ref 19–32)
Chloride: 106 mEq/L (ref 96–112)
Creatinine, Ser: 1.06 mg/dL (ref 0.40–1.50)
GFR: 74.33 mL/min (ref >60.00)
Glucose, Bld: 90 mg/dL (ref 70–99)
Potassium: 4.3 mEq/L (ref 3.5–5.1)
Sodium: 139 mEq/L (ref 135–145)

## 2014-12-28 LAB — ALT: ALT: 19 U/L (ref 0–53)

## 2014-12-28 LAB — AST: AST: 20 U/L (ref 0–37)

## 2014-12-28 LAB — HEMOGLOBIN A1C: Hgb A1c MFr Bld: 5.8 % (ref 4.6–6.5)

## 2014-12-28 MED ORDER — OMEPRAZOLE 20 MG PO CPDR: 20.0000 mg | DELAYED_RELEASE_CAPSULE | Freq: Every day | ORAL | Status: DC

## 2014-12-28 MED ORDER — DILTIAZEM HCL ER COATED BEADS 180 MG PO CP24: 180.0000 mg | ORAL_CAPSULE | Freq: Every day | ORAL | Status: DC

## 2014-12-28 MED ORDER — FENOFIBRATE 160 MG PO TABS: 160.0000 mg | ORAL_TABLET | Freq: Every day | ORAL | Status: DC

## 2014-12-28 MED ORDER — ATORVASTATIN CALCIUM 80 MG PO TABS: 80.0000 mg | ORAL_TABLET | Freq: Every day | ORAL | Status: DC

## 2014-12-28 MED ORDER — METOPROLOL SUCCINATE ER 50 MG PO TB24: 50.0000 mg | ORAL_TABLET | Freq: Every day | ORAL | Status: DC

## 2014-12-28 NOTE — Assessment & Plan Note (Signed)
Good compliance of medication, check a FLP

## 2014-12-28 NOTE — Progress Notes (Signed)
Pre visit review using our clinic review tool, if applicable. No additional management support is needed unless otherwise documented below in the visit note. 

## 2014-12-28 NOTE — Assessment & Plan Note (Addendum)
Chronic pain, on etodolac, soma, vicodin   Recommend to discontinue etodolac due to taking anticoagulations. UDS today Addendum: patient reluctant to   stop etodolac, has shoulder pain an that seems to help. We eventually agreed to decrease etodolac from twice a day to1 tablet 2 or 3 times a week at most.

## 2014-12-28 NOTE — Assessment & Plan Note (Signed)
Last BMP satisfactory, continue with oral supplementation

## 2014-12-28 NOTE — Assessment & Plan Note (Signed)
On Xanax. UDS today

## 2014-12-28 NOTE — Assessment & Plan Note (Signed)
PSA:  DRE today negative, check a PSA. Essentially asymptomatic

## 2014-12-28 NOTE — Assessment & Plan Note (Signed)
Tdap-- 05/19/10 PNA-- 04/27/14 (23) Left hand--05/19/10 (23)  Shingles-- 12/25/13   colon cancer screening  CCS: 07/26/14 with Brendan Edward, MD- Diverticulosis, otherwise normal f/u 10 years  prostate cancer screening  See comments under BPH Tobacco cessation   tobacco free for almost a month, praised!  Diet and exercise discussed

## 2014-12-28 NOTE — Assessment & Plan Note (Signed)
Due for a A1c

## 2014-12-28 NOTE — Patient Instructions (Signed)
Get your blood work before you leave   We also need a urine sample for a UDS  Stop etodolac      Fall Prevention and Home Safety Falls cause injuries and can affect all age groups. It is possible to use preventive measures to significantly decrease the likelihood of falls. There are many simple measures which can make your home safer and prevent falls. OUTDOORS  Repair cracks and edges of walkways and driveways.  Remove high doorway thresholds.  Trim shrubbery on the main path into your home.  Have good outside lighting.  Clear walkways of tools, rocks, debris, and clutter.  Check that handrails are not broken and are securely fastened. Both sides of steps should have handrails.  Have leaves, snow, and ice cleared regularly.  Use sand or salt on walkways during winter months.  In the garage, clean up grease or oil spills. BATHROOM  Install night lights.  Install grab bars by the toilet and in the tub and shower.  Use non-skid mats or decals in the tub or shower.  Place a plastic non-slip stool in the shower to sit on, if needed.  Keep floors dry and clean up all water on the floor immediately.  Remove soap buildup in the tub or shower on a regular basis.  Secure bath mats with non-slip, double-sided rug tape.  Remove throw rugs and tripping hazards from the floors. BEDROOMS  Install night lights.  Make sure a bedside light is easy to reach.  Do not use oversized bedding.  Keep a telephone by your bedside.  Have a firm chair with side arms to use for getting dressed.  Remove throw rugs and tripping hazards from the floor. KITCHEN  Keep handles on pots and pans turned toward the center of the stove. Use back burners when possible.  Clean up spills quickly and allow time for drying.  Avoid walking on wet floors.  Avoid hot utensils and knives.  Position shelves so they are not too high or low.  Place commonly used objects within easy reach.  If  necessary, use a sturdy step stool with a grab bar when reaching.  Keep electrical cables out of the way.  Do not use floor polish or wax that makes floors slippery. If you must use wax, use non-skid floor wax.  Remove throw rugs and tripping hazards from the floor. STAIRWAYS  Never leave objects on stairs.  Place handrails on both sides of stairways and use them. Fix any loose handrails. Make sure handrails on both sides of the stairways are as long as the stairs.  Check carpeting to make sure it is firmly attached along stairs. Make repairs to worn or loose carpet promptly.  Avoid placing throw rugs at the top or bottom of stairways, or properly secure the rug with carpet tape to prevent slippage. Get rid of throw rugs, if possible.  Have an electrician put in a light switch at the top and bottom of the stairs. OTHER FALL PREVENTION TIPS  Wear low-heel or rubber-soled shoes that are supportive and fit well. Wear closed toe shoes.  When using a stepladder, make sure it is fully opened and both spreaders are firmly locked. Do not climb a closed stepladder.  Add color or contrast paint or tape to grab bars and handrails in your home. Place contrasting color strips on first and last steps.  Learn and use mobility aids as needed. Install an electrical emergency response system.  Turn on lights to avoid dark  areas. Replace light bulbs that burn out immediately. Get light switches that glow.  Arrange furniture to create clear pathways. Keep furniture in the same place.  Firmly attach carpet with non-skid or double-sided tape.  Eliminate uneven floor surfaces.  Select a carpet pattern that does not visually hide the edge of steps.  Be aware of all pets. OTHER HOME SAFETY TIPS  Set the water temperature for 120 F (48.8 C).  Keep emergency numbers on or near the telephone.  Keep smoke detectors on every level of the home and near sleeping areas. Document Released: 05/15/2002  Document Revised: 11/24/2011 Document Reviewed: 08/14/2011 [facility] Patient Information 2015 [place], [place]. This information is not intended to replace advice given to you by your health care provider. Make sure you discuss any questions you have with your health care provider.   Preventive Care for Adults Ages 87 and over  Blood pressure check.** / Every 1 to 2 years.  Lipid and cholesterol check.**/ Every 5 years beginning at age 93.  Lung cancer screening. / Every year if you are aged 80-80 years and have a 30-pack-year history of smoking and currently smoke or have quit within the past 15 years. Yearly screening is stopped once you have quit smoking for at least 15 years or develop a health problem that would prevent you from having lung cancer treatment.  Fecal occult blood test (FOBT) of stool. / Every year beginning at age 68 and continuing until age 27. You may not have to do this test if you get a colonoscopy every 10 years.  Flexible sigmoidoscopy** or colonoscopy.** / Every 5 years for a flexible sigmoidoscopy or every 10 years for a colonoscopy beginning at age 84 and continuing until age 11.  Hepatitis C blood test.** / For all people born from [birth year] through 1967 and any individual with known risks for hepatitis C.  Abdominal aortic aneurysm (AAA) screening.** / A one-time screening for ages 82 to 57 years who are current or former smokers.  Skin self-exam. / Monthly.  Influenza vaccine. / Every year.  Tetanus, diphtheria, and acellular pertussis (Tdap/Td) vaccine.** / 1 dose of Td every 10 years.  Varicella vaccine.** / Consult your health care provider.  Zoster vaccine.** / 1 dose for adults aged 34 years or older.  Pneumococcal 13-valent conjugate (PCV13) vaccine.** / Consult your health care provider.  Pneumococcal polysaccharide (PPSV23) vaccine.** / 1 dose for all adults aged 16 years and older.  Meningococcal vaccine.** / Consult your health care  provider.  Hepatitis A vaccine.** / Consult your health care provider.  Hepatitis B vaccine.** / Consult your health care provider.  Haemophilus influenzae type b (Hib) vaccine.** / Consult your health care provider. **Family history and personal history of risk and conditions may change your health care provider's recommendations. Document Released: 07/21/2001 Document Revised: 05/30/2013 Document Reviewed: 10/20/2010 [facility] Patient Information 2015 [place], [place]. This information is not intended to replace advice given to you by your health care provider. Make sure you discuss any questions you have with your health care provider.

## 2014-12-28 NOTE — Assessment & Plan Note (Signed)
Well-controlled, no change, check a BMP, CBC

## 2014-12-28 NOTE — Progress Notes (Signed)
Subjective:    Patient ID: Brendan [name], male    DOB: Dec 02, 1950, 64 y.o.   MRN: [id number]  DOS:  12/28/2014 Type of visit - description :  Here for Medicare AWV:  1. Risk factors based on Past M, S, F history: reviewed 2. Physical Activities:  Yard work, no routine exercise  3. Depression/mood: neg screening  4. Hearing:  [facility], sees ENT Dr Thornell Mule  5. ADL's: independent, drives  6. Fall Risk: no recent falls, prevention discussed , see AVS 7. home Safety: does feel safe at home  8. Height, weight, & visual acuity: see VS, sees eye doctor regulalrly (yearly) 9. Counseling: provided 10. Labs ordered based on risk factors: if needed  11. Referral Coordination: if needed 12. Care Plan, see assessment and plan , written personalized plan provided , see AVS 13. Cognitive Assessment: motor skills and cognition appropriate for age 19. Care team updated, see Snap Shot  15. End-of-life care, does have a HC POA  In addition, today we discussed the following: Anxiety, on Xanax 3 times a day, needs a UDS Pain management, DJD and back pain--on hydrocodone and etodolac. Since the last visit here, he was diagnosed with sleep apnea, overnight titration study is pending. Paroxysmal atrial fibrillation-- on Xarelto. Tobacco abuse: Quit 11/19/2014,   doing well, using a patch.   Review of Systems  Constitutional: No fever. No chills. No unexplained wt changes. No unusual sweats  HEENT: No dental problems, no ear discharge, no facial swelling, no voice changes. No eye discharge, no eye  redness , no  intolerance to light   Respiratory: No wheezing , no  difficulty breathing. No cough , no mucus production  Cardiovascular: No CP, no leg swelling , no  Palpitations  GI: no nausea, no vomiting, no diarrhea , no  abdominal pain.  No blood in the stools. No dysphagia, no odynophagia    Endocrine: No polyphagia, no polyuria , no polydipsia  GU: No dysuria, gross hematuria, difficulty  urinating. No urinary urgency, no frequency.  Musculoskeletal: No joint swellings or unusual aches or pains  Skin: No change in the color of the skin, palor , no  Rash  Allergic, immunologic: No environmental allergies , no  food allergies  Neurological: No dizziness no  syncope. No headaches. No diplopia, no slurred, no slurred speech, no motor deficits, no facial  Numbness  Hematological: No enlarged lymph nodes, no easy bruising , no unusual bleedings  Psychiatry: No suicidal ideas, no hallucinations, no beavior problems, no confusion.  No unusual/severe anxiety, no depression  Past Medical History  Diagnosis Date  . Pulmonary nodule   . HTN (hypertension)   . CAD (coronary artery disease)     stent, 2001 / nuclear, June, 2010, no ischemia  . PAF (paroxysmal atrial fibrillation)   . Dyslipidemia   . Obesity   . Diverticulosis of colon   . History of colonic polyps   . BPH (benign prostatic hypertrophy)   . Testosterone deficiency   . ED (erectile dysfunction)   . Lumbar back pain   . Peripheral neuropathy     lower extremities  . Anxiety and depression   . Vitamin D deficiency   . Blurred vision     episode with confusion, evaluated Dr. Erling Cruz  . Ejection fraction   . B12 deficiency   . Scalp lesion     November, 2011  . Atrial flutter     New diagnosis November 10, 2010, rate controlled  . Tobacco  use   . Prediabetes   . Sinus bradycardia   . Allergy   . Anxiety   . Arthritis   . GERD (gastroesophageal reflux disease)     pt denies, take omeprazole d/t taking several medications not GERD per pt  . Hyperlipidemia   . Stroke     tia  . OSA (obstructive sleep apnea)     Past Surgical History  Procedure Laterality Date  . Tonsillectomy    . Clavicle surgery      remotely  . Arm fracture surgery      remotely  . Colonoscopy    . Cardica stent x2      History   Social History  . Marital Status: Married    Spouse Name: N/A  . Number of Children: 0  .  Years of Education: N/A   Occupational History  . [employer]    Social History Main Topics  . Smoking status: Former Smoker -- 0.50 packs/day for 40 years    Types: Cigarettes    Quit date: 11/19/2014  . Smokeless tobacco: Never Used     Comment: using Nicotine patches  . Alcohol Use: 3.0 oz/week    5 Standard drinks or equivalent per week     Comment: rare  . Drug Use: No  . Sexual Activity: Not on file   Other Topics Concern  . Not on file   Social History Narrative   Lives w/ wife ,no children of his own,   wife's daughter-Gk live w/ them     Family History  Problem Relation Age of Onset  . Stroke Mother   . Alzheimer's disease Mother     Jerilynn Mages, brother   . Heart failure Father   . Stroke Father   . Colon cancer Neg Hx   . Esophageal cancer Neg Hx   . Rectal cancer Neg Hx   . Stomach cancer Neg Hx   . Prostate cancer Neg Hx        Medication List       This list is accurate as of: 12/28/14  9:02 PM.  Always use your most recent med list.               ALPRAZolam 0.5 MG tablet  Commonly known as:  XANAX  Take 0.5-1 tablets (0.25-0.5 mg total) by mouth 3 (three) times daily as needed for anxiety.     atorvastatin 80 MG tablet  Commonly known as:  LIPITOR  Take 1 tablet (80 mg total) by mouth daily.     carisoprodol 350 MG tablet  Commonly known as:  SOMA  TAKE ONE (1) TABLET THREE (3) TIMES EACHDAY AS NEEDED FOR MUSCLE SPASMS     diltiazem 180 MG 24 hr capsule  Commonly known as:  CARDIZEM CD  Take 1 capsule (180 mg total) by mouth daily.     etodolac 400 MG tablet  Commonly known as:  LODINE  Take 400 mg by mouth daily as needed.     fenofibrate 160 MG tablet  Take 1 tablet (160 mg total) by mouth daily.     glucose blood test strip  Use as instructed     HYDROcodone-acetaminophen 5-325 MG per tablet  Commonly known as:  NORCO/VICODIN  Take 1 tablet by mouth 3 (three) times daily as needed.     metoprolol succinate 50 MG 24 hr tablet    Commonly known as:  TOPROL-XL  Take 1 tablet (50 mg total) by mouth daily. With or  immediately following a meal.     multivitamin tablet  Take 1 tablet by mouth daily.     NICOTINE TD  Place 1 patch onto the skin daily as needed.     omeprazole 20 MG capsule  Commonly known as:  PRILOSEC  Take 1 capsule (20 mg total) by mouth daily.     onetouch ultrasoft lancets  Use as instructed     rivaroxaban 20 MG Tabs tablet  Commonly known as:  XARELTO  Take 1 tablet (20 mg total) by mouth daily.     tolterodine 4 MG 24 hr capsule  Commonly known as:  DETROL LA  Take 4 mg by mouth daily.     vitamin B-12 500 MCG tablet  Commonly known as:  CYANOCOBALAMIN  Take 1,000 mcg by mouth daily.     Vitamin D 2000 UNITS Caps  Take 1 capsule by mouth daily.           Objective:   Physical Exam BP 128/84 mmHg  Pulse 53  Temp(Src) 97.4 F (36.3 C) (Oral)  Ht 6\' 6"  (1.981 m)  Wt 276 lb 4 oz (125.306 kg)  BMI 31.93 kg/m2  SpO2 97% General:   Well developed, well nourished . NAD.  Neck:  Full range of motion. Supple. No  Thyromegaly  HEENT:  Normocephalic . Face symmetric, atraumatic Lungs:  CTA B Normal respiratory effort, no intercostal retractions, no accessory muscle use. Heart: RRR,  no murmur.  No pretibial edema bilaterally  Abdomen:  Not distended, soft, non-tender. No rebound or rigidity. No mass,organomegaly Rectal:  External abnormalities: none. Normal sphincter tone. No rectal masses or tenderness.  No stools found prostate gland firm and smooth, no enlargement, nodularity, tenderness, mass, asymmetry or induration.  Skin: Exposed areas without rash. Not pale. Not jaundice Neurologic:  alert & oriented X3.  Speech normal, gait appropriate for age and unassisted Strength symmetric and appropriate for age.  Psych: Cognition and judgment appear intact.  Cooperative with normal attention span and concentration.  Behavior appropriate. No anxious or depressed  appearing.    Assessment & Plan:

## 2014-12-31 MED ORDER — EZETIMIBE 10 MG PO TABS: 10.0000 mg | ORAL_TABLET | Freq: Every day | ORAL | Status: DC

## 2014-12-31 NOTE — Addendum Note (Signed)
Addended by: Wilfrid Lund on: 12/31/2014 11:16 AM   Modules accepted: Orders

## 2015-01-09 ENCOUNTER — Telehealth: Payer: Self-pay | Admitting: Cardiology

## 2015-01-09 ENCOUNTER — Telehealth: Payer: Self-pay

## 2015-01-09 NOTE — Telephone Encounter (Signed)
Please let the patient know that I agree with the addition of Zetia.

## 2015-01-09 NOTE — Telephone Encounter (Signed)
New message    Pt's PCP put him on zytia for his cholesterol Pt would like to discuss being put on this medication with Dr. Ron Parker Please call to discuss

## 2015-01-09 NOTE — Telephone Encounter (Signed)
UDS: 12/28/2014   Positive for Alprazolam Positive for Soma Positive for Hydrocodone   Low risk per Dr. Larose Kells 01/09/2015

## 2015-01-09 NOTE — Telephone Encounter (Signed)
Spoke with pt and he states that Dr. Larose Kells prescribed Zetia 10mg  QD recently due to pt's LDL being elevated.  Pt's LDL 103 from labs on 7/22. Pt wanted to make Dr. Ron Parker aware of this and get his opinion on taking this medication before he actually starts it. Pt said that Dr. Larose Kells is great and it's nothing against him, it was just that Dr. Ron Parker has always controlled his lipid medications so he just wanted to run this by Dr. Ron Parker first. Informed pt that I would route this information to Dr. Ron Parker for review and advisement.

## 2015-01-10 NOTE — Telephone Encounter (Signed)
The pt is advised and he verbalized understanding. 

## 2015-01-15 ENCOUNTER — Encounter: Payer: Self-pay | Admitting: Internal Medicine

## 2015-01-17 ENCOUNTER — Other Ambulatory Visit: Payer: Self-pay | Admitting: Internal Medicine

## 2015-01-30 ENCOUNTER — Encounter: Payer: Self-pay | Admitting: Cardiology

## 2015-02-15 ENCOUNTER — Telehealth: Payer: Self-pay | Admitting: Internal Medicine

## 2015-02-15 ENCOUNTER — Other Ambulatory Visit: Payer: Self-pay | Admitting: Internal Medicine

## 2015-02-15 MED ORDER — HYDROCODONE-ACETAMINOPHEN 5-325 MG PO TABS: 1.0000 | ORAL_TABLET | Freq: Three times a day (TID) | ORAL | Status: DC | PRN

## 2015-02-15 NOTE — Telephone Encounter (Signed)
Spoke with Pt, informed him that Rx is ready for pick up at the front desk. Pt verbalized understanding.

## 2015-02-15 NOTE — Telephone Encounter (Signed)
Rx's for September and October 2016 printed, awaiting MD signature.

## 2015-02-15 NOTE — Telephone Encounter (Signed)
Okay #90, 2 prescriptions 

## 2015-02-15 NOTE — Telephone Encounter (Signed)
Relation to AT:FTDD Call back [phone]   Reason for call:  Patient requesting a refill HYDROcodone-acetaminophen (NORCO/VICODIN) 5-325 MG per tablet

## 2015-02-15 NOTE — Telephone Encounter (Signed)
Pt is requesting refill on Hydrocodone.  Last OV: 12/28/2014 Last Fill: 11/09/2014 #90 0RF UDS: 12/28/2014 Low risk  Please advise.

## 2015-02-25 ENCOUNTER — Ambulatory Visit (HOSPITAL_BASED_OUTPATIENT_CLINIC_OR_DEPARTMENT_OTHER)
Admission: RE | Admit: 2015-02-25 | Discharge: 2015-02-25 | Disposition: A | Discharge status: Home / Self Care | Payer: Medicare Other | Source: Ambulatory Visit | Attending: Internal Medicine | Admitting: Internal Medicine

## 2015-02-25 ENCOUNTER — Ambulatory Visit (INDEPENDENT_AMBULATORY_CARE_PROVIDER_SITE_OTHER): Payer: Medicare Other | Admitting: Internal Medicine

## 2015-02-25 ENCOUNTER — Encounter: Payer: Self-pay | Admitting: Internal Medicine

## 2015-02-25 VITALS — BP 118/72 | HR 58 | Temp 97.7°F | Ht 78.0 in | Wt 275.0 lb

## 2015-02-25 DIAGNOSIS — R51 Headache: Secondary | ICD-10-CM | POA: Insufficient documentation

## 2015-02-25 DIAGNOSIS — Z1159 Encounter for screening for other viral diseases: Secondary | ICD-10-CM | POA: Diagnosis not present

## 2015-02-25 DIAGNOSIS — I2510 Atherosclerotic heart disease of native coronary artery without angina pectoris: Secondary | ICD-10-CM

## 2015-02-25 DIAGNOSIS — Z09 Encounter for follow-up examination after completed treatment for conditions other than malignant neoplasm: Secondary | ICD-10-CM

## 2015-02-25 DIAGNOSIS — R519 Headache, unspecified: Secondary | ICD-10-CM

## 2015-02-25 LAB — CBC WITH DIFFERENTIAL/PLATELET
Basophils Absolute: 0 10*3/uL (ref 0.0–0.1)
Basophils Relative: 0.9 % (ref 0.0–3.0)
EOS ABS: 0.1 10*3/uL (ref 0.0–0.7)
EOS PCT: 3 % (ref 0.0–5.0)
HCT: 45 % (ref 39.0–52.0)
HEMOGLOBIN: 15.5 g/dL (ref 13.0–17.0)
Lymphocytes Relative: 43.1 % (ref 12.0–46.0)
Lymphs Abs: 2 10*3/uL (ref 0.7–4.0)
MCHC: 34.5 g/dL (ref 30.0–36.0)
MCV: 91.2 fl (ref 78.0–100.0)
Monocytes Absolute: 0.3 10*3/uL (ref 0.1–1.0)
Monocytes Relative: 6.8 % (ref 3.0–12.0)
Neutro Abs: 2.2 10*3/uL (ref 1.4–7.7)
Neutrophils Relative %: 46.2 % (ref 43.0–77.0)
Platelets: 248 10*3/uL (ref 150.0–400.0)
RBC: 4.94 Mil/uL (ref 4.22–5.81)
RDW: 13 % (ref 11.5–15.5)
WBC: 4.7 10*3/uL (ref 4.0–10.5)

## 2015-02-25 LAB — SEDIMENTATION RATE: Sed Rate: 0 mm/hr (ref 0–22)

## 2015-02-25 NOTE — Assessment & Plan Note (Signed)
Headache: New onset of headache, the patient is anticoagulated. DDX includes an ICB, cervicogenic, neuralgia, migraines, tension HA  versus others. Sx begun  after he restarted Lipitor. Related? Plan: Hold atorvastatin, CT head, CBC, sedimentation rate. PRN Tylenol and Vicodin, avoid excessive acetaminophen Follow-up 2 weeks

## 2015-02-25 NOTE — Progress Notes (Signed)
Subjective:    Patient ID: Brendan [name], male    DOB: 04-04-1951, 64 y.o.   MRN: [id number]  DOS:  02/25/2015 Type of visit - description : Acute visit, here with his wife. Interval history: Complaining of headache for 3 days, that is very unusual to him. Headache is located at the left parietal area, no radiation, worse when he bends forward or if he is active like walking. The onset of the headache is sudden and may last for 5 minutes to a few hours. He is taking Tylenol and hydrocodone as needed which usually good response. Denies any injury, fall. He does some neck pain which is not a new thing. He did stop atorvastatin 01/31/2015 under the advice of cardiology, restarted atorvastatin 3 or 4 days ago.   Review of Systems  No fever chills, no weight loss No nausea vomiting He does have aches and pains on and off, these have been usually every summer when he is more active in the yard, this summer did have -as expected-  more aches and pains than previous months.  Past Medical History  Diagnosis Date  . Pulmonary nodule   . HTN (hypertension)   . CAD (coronary artery disease)     stent, 2001 / nuclear, June, 2010, no ischemia  . PAF (paroxysmal atrial fibrillation)   . Dyslipidemia   . Obesity   . Diverticulosis of colon   . History of colonic polyps   . BPH (benign prostatic hypertrophy)   . Testosterone deficiency   . ED (erectile dysfunction)   . Lumbar back pain   . Peripheral neuropathy     lower extremities  . Anxiety and depression   . Vitamin D deficiency   . Blurred vision     episode with confusion, evaluated Dr. Erling Cruz  . Ejection fraction   . B12 deficiency   . Scalp lesion     November, 2011  . Atrial flutter     New diagnosis November 10, 2010, rate controlled  . Tobacco use   . Prediabetes   . Sinus bradycardia   . Allergy   . Anxiety   . Arthritis   . GERD (gastroesophageal reflux disease)     pt denies, take omeprazole d/t taking several  medications not GERD per pt  . Hyperlipidemia   . Stroke     tia  . OSA (obstructive sleep apnea)     Past Surgical History  Procedure Laterality Date  . Tonsillectomy    . Clavicle surgery      remotely  . Arm fracture surgery      remotely  . Colonoscopy    . Cardica stent x2      Social History   Social History  . Marital Status: Married    Spouse Name: N/A  . Number of Children: 0  . Years of Education: N/A   Occupational History  . [employer]    Social History Main Topics  . Smoking status: Former Smoker -- 0.50 packs/day for 40 years    Types: Cigarettes    Quit date: 11/19/2014  . Smokeless tobacco: Never Used     Comment: using Nicotine patches  . Alcohol Use: 3.0 oz/week    5 Standard drinks or equivalent per week     Comment: rare  . Drug Use: No  . Sexual Activity: Not on file   Other Topics Concern  . Not on file   Social History Narrative   Lives w/ wife ,no  children of his own,   wife's daughter-Gk live w/ them        Medication List       This list is accurate as of: 02/25/15  6:47 PM.  Always use your most recent med list.               ALPRAZolam 0.5 MG tablet  Commonly known as:  XANAX  Take 0.5-1 tablets (0.25-0.5 mg total) by mouth 3 (three) times daily as needed for anxiety.     atorvastatin 80 MG tablet  Commonly known as:  LIPITOR  Take 1 tablet (80 mg total) by mouth daily.     carisoprodol 350 MG tablet  Commonly known as:  SOMA  TAKE ONE (1) TABLET THREE (3) TIMES EACHDAY AS NEEDED FOR MUSCLE SPASMS     diltiazem 180 MG 24 hr capsule  Commonly known as:  CARDIZEM CD  Take 1 capsule (180 mg total) by mouth daily.     fenofibrate 160 MG tablet  Take 1 tablet (160 mg total) by mouth daily.     glucose blood test strip  Use as instructed     HYDROcodone-acetaminophen 5-325 MG per tablet  Commonly known as:  NORCO/VICODIN  Take 1 tablet by mouth 3 (three) times daily as needed.     metoprolol succinate 50 MG 24 hr  tablet  Commonly known as:  TOPROL-XL  Take 1 tablet (50 mg total) by mouth daily. With or immediately following a meal.     multivitamin tablet  Take 1 tablet by mouth daily.     NICOTINE TD  Place 1 patch onto the skin daily as needed.     omeprazole 20 MG capsule  Commonly known as:  PRILOSEC  Take 1 capsule (20 mg total) by mouth daily.     onetouch ultrasoft lancets  Use as instructed     rivaroxaban 20 MG Tabs tablet  Commonly known as:  XARELTO  Take 1 tablet (20 mg total) by mouth daily with supper.     tolterodine 4 MG 24 hr capsule  Commonly known as:  DETROL LA  Take 4 mg by mouth daily.     TYLENOL 8 HOUR ARTHRITIS PAIN PO  Take 2 tablets by mouth daily.     vitamin B-12 500 MCG tablet  Commonly known as:  CYANOCOBALAMIN  Take 1,000 mcg by mouth daily.     Vitamin D 2000 UNITS Caps  Take 1 capsule by mouth daily.           Objective:   Physical Exam BP 118/72 mmHg  Pulse 58  Temp(Src) 97.7 F (36.5 C) (Oral)  Ht 6\' 6"  (1.981 m)  Wt 275 lb (124.739 kg)  BMI 31.79 kg/m2  SpO2 97% General:   Well developed, well nourished . NAD.  HEENT:  Normocephalic . Face symmetric, atraumatic. Nose not congested. Neck: Full range of motion, no TTP. Lungs:  CTA B Normal respiratory effort, no intercostal retractions, no accessory muscle use. Heart: Bradycardic, seems regular ,  no murmur.  No pretibial edema bilaterally  Skin: Not pale. Not jaundice Neurologic:  alert & oriented X3.  Speech normal, gait appropriate for age and unassisted EOMI, pupils equal and reactive, DTRs symmetric Psych--  Cognition and judgment appear intact.  Cooperative with normal attention span and concentration.  Behavior appropriate. No anxious or depressed appearing.      Assessment & Plan:  Problem list>  Prediabetes HTN Hyperlipidemia Anxiety depression CV: CAD, Paroxysmal Atrial fibrillation TIA  OSA BPH Hypogonadism ED B12 deficiency Peripheral  neuropathy H/o pulmonary nodule, CXR 3-16 stable, no further schedule imaging  A/P Headache: New onset of headache, the patient is anticoagulated. DDX includes an ICB, cervicogenic, neuralgia, migraines, tension HA  versus others. Sx begun  after he restarted Lipitor. Related? Plan: Hold atorvastatin, CT head, CBC, sedimentation rate. PRN Tylenol and Vicodin, avoid excessive acetaminophen Follow-up 2 weeks

## 2015-02-25 NOTE — Progress Notes (Signed)
Pre visit review using our clinic review tool, if applicable. No additional management support is needed unless otherwise documented below in the visit note. 

## 2015-02-25 NOTE — Patient Instructions (Signed)
Get your blood work before you leave   We are ordering a CT of the head today  For the headache take either Tylenol or hydrocodone. Please avoid taking more than 4000 mg of Tylenol (acetaminophen) daily  Call anytime if the headaches get worse, more intense, persistent or if you have  visual disturbances.     Next visit  for a checkup in 2 weeks   (30 minutes) Please schedule an appointment at the front desk

## 2015-02-26 LAB — HEPATITIS C ANTIBODY: HCV Ab: NEGATIVE

## 2015-03-11 ENCOUNTER — Ambulatory Visit (HOSPITAL_BASED_OUTPATIENT_CLINIC_OR_DEPARTMENT_OTHER): Payer: Medicare Other | Attending: Pulmonary Disease | Admitting: Radiology

## 2015-03-11 DIAGNOSIS — G4761 Periodic limb movement disorder: Secondary | ICD-10-CM | POA: Insufficient documentation

## 2015-03-11 DIAGNOSIS — G4733 Obstructive sleep apnea (adult) (pediatric): Secondary | ICD-10-CM

## 2015-03-13 ENCOUNTER — Other Ambulatory Visit: Payer: Self-pay | Admitting: Internal Medicine

## 2015-03-13 NOTE — Telephone Encounter (Signed)
Pt is requesting refill on Alprazolam.  Last OV: 02/25/2015 Last Fill: 12/24/2014 #270 0RF UDS: 12/28/2014 Low risk  Please advise.

## 2015-03-13 NOTE — Telephone Encounter (Signed)
Ok 270, 1 RF 

## 2015-03-13 NOTE — Telephone Encounter (Signed)
Rx printed, awaiting MD signature.  

## 2015-03-13 NOTE — Telephone Encounter (Signed)
Rx faxed to [facility].  

## 2015-03-14 ENCOUNTER — Telehealth: Payer: Self-pay | Admitting: Pulmonary Disease

## 2015-03-14 DIAGNOSIS — G4733 Obstructive sleep apnea (adult) (pediatric): Secondary | ICD-10-CM | POA: Diagnosis not present

## 2015-03-14 NOTE — Telephone Encounter (Signed)
Send Rx for & let pt know pl - CPAP therapy on 9 cm H2O with a Large size Resmed Full Face Mask AirFit F10 mask and heated humidification. Download in 4 wks OV with TP in 6 wks

## 2015-03-14 NOTE — Progress Notes (Signed)
Patient Name: Brendan [name], Brendan [name] Date: 03/11/2015 Gender: Male D.O.B: 07/10/1950 Age (years): 76 Referring Provider: Kara Mead MD, ABSM Height (inches): 78 Interpreting Physician: Kara Mead MD, ABSM Weight (lbs): 278 RPSGT: Zadie Rhine BMI: 32 MRN: [id number] Neck Size: 16.50   CLINICAL INFORMATION The patient is referred for a CPAP titration to treat sleep apnea.  Date of  HST: 12/2014- mod OSA , AHI 15/h  SLEEP STUDY TECHNIQUE As per the AASM Manual for the Scoring of Sleep and Associated Events v2.3 (April 2016) with a hypopnea requiring 4% desaturations.  The channels recorded and monitored were frontal, central and occipital EEG, electrooculogram (EOG), submentalis EMG (chin), nasal and oral airflow, thoracic and abdominal wall motion, anterior tibialis EMG, snore microphone, electrocardiogram, and pulse oximetry. Continuous positive airway pressure (CPAP) was initiated at the beginning of the study and titrated to treat sleep-disordered breathing.  MEDICATIONS Medications administered by patient during sleep study : No sleep medicine administered.  TECHNICIAN COMMENTS Comments added by technician: PT WENT TO RESTROOM ONCE. NO MEDICITION TAKEN. Patient requested to end study early due to discomfort with equipment.  Comments added by scorer: N/A   RESPIRATORY PARAMETERS Optimal PAP Pressure (cm): 9 AHI at Optimal Pressure (/hr): 0.0 Overall Minimal O2 (%): 88.00 Supine % at Optimal Pressure (%): 0 Minimal O2 at Optimal Pressure (%): 89.0     SLEEP ARCHITECTURE The study was initiated at 10:10:24 PM and ended at 4:06:19 AM.  Sleep onset time was 18.4 minutes and the sleep efficiency was 56.2%. The total sleep time was 200.0 minutes.  The patient spent 26.00% of the night in stage N1 sleep, 66.00% in stage N2 sleep, 0.00% in stage N3 and 8.00% in REM.Stage REM latency was 93.0 minutes  Wake after sleep onset was 137.6. Alpha intrusion was absent. Supine sleep was  4.50%.  CARDIAC DATA The 2 lead EKG demonstrated sinus rhythm. The mean heart rate was 53.20 beats per minute. Other EKG findings include: None.   LEG MOVEMENT DATA The total Periodic Limb Movements of Sleep (PLMS) were 151. The PLMS index was 45.30. A PLMS index of <15 is considered normal in adults.  IMPRESSIONS The optimal PAP pressure was 9 cm of water. Central sleep apnea was not noted during this titration (CAI = 0.0/h). Moderate oxygen desaturations were observed during this titration (min O2 = 88.00%). No snoring was audible during this study. No cardiac abnormalities were observed during this study. Moderate periodic limb movements were observed during this study. Arousals associated with PLMs were rare.   DIAGNOSIS Obstructive Sleep Apnea (327.23 [G47.33 ICD-10]) PLMs   RECOMMENDATIONS Trial of CPAP therapy on 9 cm H2O with a Large size Resmed Full Face Mask AirFit F10 mask and heated humidification. Avoid alcohol, sedatives and other CNS depressants that may worsen sleep apnea and disrupt normal sleep architecture. Sleep hygiene should be reviewed to assess factors that may improve sleep quality. Weight management and regular exercise should be initiated or continued. Return for re-evaluation after 4 weeks of therapy  Rigoberto Noel  MD Board certified in Sleep Medicine

## 2015-03-14 NOTE — Addendum Note (Signed)
Addended by: Rigoberto Noel on: 03/14/2015 05:28 PM   Modules accepted: Level of Service

## 2015-03-15 NOTE — Telephone Encounter (Signed)
CPAP experience can be better in home setting Can certainly try dental device, if he absolutely does not want cpap Have him find out from his dentist - if he does no, can refer him to dr Ron Parker

## 2015-03-15 NOTE — Telephone Encounter (Signed)
Patient says that he needs to think about this for a while because he says that when he did the sleep study, it was the worse night of sleep he has ever had.  He says he was pulling on the mask constantly throughout the night.  Went to sleep at 1030 and woke up at 230.  He just laid in the bed for the allotted time that insurance would pay.  Patient hated the CPAP machine.   Wants to know if he is a candidate for Dental appliance.  Patient's dentist is Wallace Going in [place], patient has appointment with him on 10/20.   Dr. Elsworth Soho, please advise.

## 2015-03-18 NOTE — Telephone Encounter (Signed)
Patient notified. Patient says that he will think over the options and let us know what he decides. Nothing further needed.

## 2015-03-21 ENCOUNTER — Telehealth: Payer: Self-pay | Admitting: Cardiology

## 2015-03-21 ENCOUNTER — Telehealth: Payer: Self-pay | Admitting: Internal Medicine

## 2015-03-21 NOTE — Telephone Encounter (Signed)
Please advise 

## 2015-03-21 NOTE — Telephone Encounter (Signed)
LM for patient that he will NOT need to be fasting.  Had lipids ordered by PCP in July that are in [facility].

## 2015-03-21 NOTE — Telephone Encounter (Signed)
Ok to cancel the visit.   Also recommend to go back on Lipitor and see if the headache returns. Next visit 06-2015

## 2015-03-21 NOTE — Telephone Encounter (Signed)
Spoke with Pt, informed him okay to cancel appt for 10/14  since he is no longer having headaches. Informed him that Dr. Larose Kells does want to see him back in January, Pt has appt schedule 07/11/2015. Instructed Pt to start back on Lipitor, if headaches return, stop medication and let us know. Pt verbalized understanding.

## 2015-03-21 NOTE — Telephone Encounter (Signed)
Pt is scheduled for an appt with Dr Stanford Breed on 04-24-15. He wants to know if he should come in fasting? This will be his first appt with him,Dr Ron Parker referred him to Dr Stanford Breed.

## 2015-03-21 NOTE — Telephone Encounter (Signed)
Pt has 30 minute f/u visit for 03/22/15. Pt left VM 10/13 9:04am to see if he needs to come in. He said that headache has not come back and he doesn't know if he needs to f/u. Please advise. I will notify pt.

## 2015-03-22 ENCOUNTER — Ambulatory Visit: Payer: Medicare Other | Admitting: Internal Medicine

## 2015-03-28 DIAGNOSIS — Z23 Encounter for immunization: Secondary | ICD-10-CM | POA: Diagnosis not present

## 2015-04-07 ENCOUNTER — Emergency Department (HOSPITAL_BASED_OUTPATIENT_CLINIC_OR_DEPARTMENT_OTHER)
Admission: EM | Admit: 2015-04-07 | Discharge: 2015-04-07 | Disposition: A | Discharge status: Home / Self Care | Payer: Medicare Other | Attending: Emergency Medicine | Admitting: Emergency Medicine

## 2015-04-07 ENCOUNTER — Encounter (HOSPITAL_BASED_OUTPATIENT_CLINIC_OR_DEPARTMENT_OTHER): Payer: Self-pay | Admitting: *Deleted

## 2015-04-07 DIAGNOSIS — Z86010 Personal history of colonic polyps: Secondary | ICD-10-CM | POA: Diagnosis not present

## 2015-04-07 DIAGNOSIS — K219 Gastro-esophageal reflux disease without esophagitis: Secondary | ICD-10-CM | POA: Diagnosis not present

## 2015-04-07 DIAGNOSIS — Z8669 Personal history of other diseases of the nervous system and sense organs: Secondary | ICD-10-CM | POA: Diagnosis not present

## 2015-04-07 DIAGNOSIS — M1990 Unspecified osteoarthritis, unspecified site: Secondary | ICD-10-CM | POA: Insufficient documentation

## 2015-04-07 DIAGNOSIS — Z8673 Personal history of transient ischemic attack (TIA), and cerebral infarction without residual deficits: Secondary | ICD-10-CM | POA: Insufficient documentation

## 2015-04-07 DIAGNOSIS — E559 Vitamin D deficiency, unspecified: Secondary | ICD-10-CM | POA: Insufficient documentation

## 2015-04-07 DIAGNOSIS — E538 Deficiency of other specified B group vitamins: Secondary | ICD-10-CM | POA: Diagnosis not present

## 2015-04-07 DIAGNOSIS — N400 Enlarged prostate without lower urinary tract symptoms: Secondary | ICD-10-CM | POA: Diagnosis not present

## 2015-04-07 DIAGNOSIS — E669 Obesity, unspecified: Secondary | ICD-10-CM | POA: Insufficient documentation

## 2015-04-07 DIAGNOSIS — I480 Paroxysmal atrial fibrillation: Secondary | ICD-10-CM | POA: Diagnosis not present

## 2015-04-07 DIAGNOSIS — I2510 Atherosclerotic heart disease of native coronary artery without angina pectoris: Secondary | ICD-10-CM | POA: Insufficient documentation

## 2015-04-07 DIAGNOSIS — E785 Hyperlipidemia, unspecified: Secondary | ICD-10-CM | POA: Insufficient documentation

## 2015-04-07 DIAGNOSIS — F419 Anxiety disorder, unspecified: Secondary | ICD-10-CM | POA: Diagnosis not present

## 2015-04-07 DIAGNOSIS — H6123 Impacted cerumen, bilateral: Secondary | ICD-10-CM | POA: Insufficient documentation

## 2015-04-07 DIAGNOSIS — Z87891 Personal history of nicotine dependence: Secondary | ICD-10-CM | POA: Diagnosis not present

## 2015-04-07 DIAGNOSIS — I10 Essential (primary) hypertension: Secondary | ICD-10-CM | POA: Diagnosis not present

## 2015-04-07 NOTE — Discharge Instructions (Signed)
Cerumen Impaction The structures of the external ear canal secrete a waxy substance known as cerumen. Excess cerumen can build up in the ear canal, causing a condition known as cerumen impaction. Cerumen impaction can cause ear pain and disrupt the function of the ear. The rate of cerumen production differs for each individual. In certain individuals, the configuration of the ear canal may decrease his or her ability to naturally remove cerumen. CAUSES Cerumen impaction is caused by excessive cerumen production or buildup. RISK FACTORS  Frequent use of swabs to clean ears.  Having narrow ear canals.  Having eczema.  Being dehydrated. SIGNS AND SYMPTOMS  Diminished hearing.  Ear drainage.  Ear pain.  Ear itch. TREATMENT Treatment may involve:  Over-the-counter or prescription ear drops to soften the cerumen.  Removal of cerumen by a health care provider. This may be done with:  Irrigation with warm water. This is the most common method of removal.  Ear curettes and other instruments.  Surgery. This may be done in severe cases. HOME CARE INSTRUCTIONS  Take medicines only as directed by your health care provider.  Do not insert objects into the ear with the intent of cleaning the ear. PREVENTION  Do not insert objects into the ear, even with the intent of cleaning the ear. Removing cerumen as a part of normal hygiene is not necessary, and the use of swabs in the ear canal is not recommended.  Drink enough water to keep your urine clear or pale yellow.  Control your eczema if you have it. SEEK MEDICAL CARE IF:  You develop ear pain.  You develop bleeding from the ear.  The cerumen does not clear after you use ear drops as directed.   This information is not intended to replace advice given to you by your health care provider. Make sure you discuss any questions you have with your health care provider.   Document Released: 07/02/2004 Document Revised: 06/15/2014  Document Reviewed: 01/09/2015 Elsevier Interactive Patient Education 2016 [employer]  

## 2015-04-07 NOTE — ED Notes (Signed)
Both ears washed utilizing elephant ear wash & wax removed from ears

## 2015-04-07 NOTE — ED Provider Notes (Signed)
CSN: [id number]     Arrival date & time 04/07/15  1327 History   First MD Initiated Contact with Patient 04/07/15 1402     Chief Complaint  Patient presents with  . Cerumen Impaction     (Consider location/radiation/quality/duration/timing/severity/associated sxs/prior Treatment) HPI Comments: Patient presents to the ER for evaluation of bilateral cerumen impactions. Patient has history of recurrent impactions, does have an ENT specialist that he sees. He reports that waxes buildup over last few days and he is having trouble hearing. There is no pain.   Past Medical History  Diagnosis Date  . Pulmonary nodule   . HTN (hypertension)   . CAD (coronary artery disease)     stent, 2001 / nuclear, June, 2010, no ischemia  . PAF (paroxysmal atrial fibrillation) ([place])   . Dyslipidemia   . Obesity   . Diverticulosis of colon   . History of colonic polyps   . BPH (benign prostatic hypertrophy)   . Testosterone deficiency   . ED (erectile dysfunction)   . Lumbar back pain   . Peripheral neuropathy (HCC)     lower extremities  . Anxiety and depression   . Vitamin D deficiency   . Blurred vision     episode with confusion, evaluated Dr. Erling Cruz  . Ejection fraction   . B12 deficiency   . Scalp lesion     November, 2011  . Atrial flutter [facility])     New diagnosis November 10, 2010, rate controlled  . Tobacco use   . Prediabetes   . Sinus bradycardia   . Allergy   . Anxiety   . Arthritis   . GERD (gastroesophageal reflux disease)     pt denies, take omeprazole d/t taking several medications not GERD per pt  . Hyperlipidemia   . Stroke ([place])     tia  . OSA (obstructive sleep apnea)    Past Surgical History  Procedure Laterality Date  . Tonsillectomy    . Clavicle surgery      remotely  . Arm fracture surgery      remotely  . Colonoscopy    . Cardica stent x2     Family History  Problem Relation Age of Onset  . Stroke Mother   . Alzheimer's disease Mother     Jerilynn Mages, brother   .  Heart failure Father   . Stroke Father   . Colon cancer Neg Hx   . Esophageal cancer Neg Hx   . Rectal cancer Neg Hx   . Stomach cancer Neg Hx   . Prostate cancer Neg Hx    Social History  Substance Use Topics  . Smoking status: Former Smoker -- 0.50 packs/day for 40 years    Types: Cigarettes    Quit date: 11/19/2014  . Smokeless tobacco: Never Used     Comment: using Nicotine patches  . Alcohol Use: 3.0 oz/week    5 Standard drinks or equivalent per week     Comment: rare    Review of Systems  HENT: Positive for hearing loss.   All other systems reviewed and are negative.     Allergies  Tussionex pennkinetic er; Albuterol; and Qvar  Home Medications   Prior to Admission medications   Medication Sig Start Date End Date Taking? Authorizing Provider  Acetaminophen (TYLENOL 8 HOUR ARTHRITIS PAIN PO) Take 2 tablets by mouth daily.    Historical Provider, MD  ALPRAZolam Duanne Moron) 0.5 MG tablet Take 0.5-1 tablets (0.25-0.5 mg total) by mouth 3 (three) times  daily as needed for anxiety. 03/13/15   Colon Branch, MD  atorvastatin (LIPITOR) 80 MG tablet Take 1 tablet (80 mg total) by mouth daily. 12/28/14 12/28/15  Colon Branch, MD  carisoprodol (SOMA) 350 MG tablet TAKE ONE (1) TABLET THREE (3) TIMES EACHDAY AS NEEDED FOR MUSCLE SPASMS 08/22/14   Colon Branch, MD  Cholecalciferol (VITAMIN D) 2000 UNITS CAPS Take 1 capsule by mouth daily.    Historical Provider, MD  diltiazem (CARDIZEM CD) 180 MG 24 hr capsule Take 1 capsule (180 mg total) by mouth daily. 12/28/14   Colon Branch, MD  fenofibrate 160 MG tablet Take 1 tablet (160 mg total) by mouth daily. 12/28/14   Colon Branch, MD  glucose blood test strip Use as instructed Patient not taking: Reported on 12/28/2014 04/16/14   Colon Branch, MD  HYDROcodone-acetaminophen (NORCO/VICODIN) 5-325 MG per tablet Take 1 tablet by mouth 3 (three) times daily as needed. 02/15/15   Colon Branch, MD  Lancets [facility] ULTRASOFT) lancets Use as instructed Patient not  taking: Reported on 12/28/2014 04/16/14   Colon Branch, MD  metoprolol succinate (TOPROL-XL) 50 MG 24 hr tablet Take 1 tablet (50 mg total) by mouth daily. With or immediately following a meal. 12/28/14   Colon Branch, MD  Multiple Vitamin (MULTIVITAMIN) tablet Take 1 tablet by mouth daily.      Historical Provider, MD  NICOTINE TD Place 1 patch onto the skin daily as needed.    Historical Provider, MD  omeprazole (PRILOSEC) 20 MG capsule Take 1 capsule (20 mg total) by mouth daily. 12/28/14   Colon Branch, MD  rivaroxaban (XARELTO) 20 MG TABS tablet Take 1 tablet (20 mg total) by mouth daily with supper. 02/15/15   Colon Branch, MD  tolterodine (DETROL LA) 4 MG 24 hr capsule Take 4 mg by mouth daily.    Historical Provider, MD  vitamin B-12 (CYANOCOBALAMIN) 500 MCG tablet Take 1,000 mcg by mouth daily.     Historical Provider, MD   BP 121/79 mmHg  Pulse 67  Temp(Src) 98.1 F (36.7 C) (Oral)  Resp 18  Ht 6\' 6"  (1.981 m)  Wt 275 lb (124.739 kg)  BMI 31.79 kg/m2  SpO2 96% Physical Exam  Constitutional: He is oriented to person, place, and time. He appears well-developed and well-nourished. No distress.  HENT:  Head: Normocephalic and atraumatic.  Right Ear: Hearing normal.  Left Ear: Hearing normal.  Nose: Nose normal.  Mouth/Throat: Oropharynx is clear and moist and mucous membranes are normal.  Bilateral cerumen impactions  Eyes: Conjunctivae and EOM are normal. Pupils are equal, round, and reactive to light.  Neck: Normal range of motion. Neck supple.  Cardiovascular: Regular rhythm, S1 normal and S2 normal.  Exam reveals no gallop and no friction rub.   No murmur heard. Pulmonary/Chest: Effort normal and breath sounds normal. No respiratory distress. He exhibits no tenderness.  Abdominal: Soft. Normal appearance and bowel sounds are normal. There is no hepatosplenomegaly. There is no tenderness. There is no rebound, no guarding, no tenderness at McBurney's point and negative Murphy's sign. No  hernia.  Musculoskeletal: Normal range of motion.  Neurological: He is alert and oriented to person, place, and time. He has normal strength. No cranial nerve deficit or sensory deficit. Coordination normal. GCS eye subscore is 4. GCS verbal subscore is 5. GCS motor subscore is 6.  Skin: Skin is warm, dry and intact. No rash noted. No cyanosis.  Psychiatric: He  has a normal mood and affect. His speech is normal and behavior is normal. Thought content normal.  Nursing note and vitals reviewed.   ED Course  Procedures (including critical care time) Labs Review Labs Reviewed - No data to display  Imaging Review No results found. I have personally reviewed and evaluated these images and lab results as part of my medical decision-making.   EKG Interpretation None      MDM   Final diagnoses:  None   cerumen impaction  Patient presents to the emergency department for evaluation of decreased hearing and feeling like his ears are blocked. He has bilateral cerumen impactions. Impactions were cleared with irrigation by nursing staff.    Orpah Greek, MD 04/07/15 1409

## 2015-04-07 NOTE — ED Notes (Signed)
Reports ear wax build up both ears-

## 2015-04-10 ENCOUNTER — Other Ambulatory Visit: Payer: Self-pay | Admitting: Internal Medicine

## 2015-04-10 NOTE — Telephone Encounter (Signed)
Ok 90 and 5 rf

## 2015-04-10 NOTE — Telephone Encounter (Signed)
Pt is requesting refill on Soma.  Last OV: 02/25/2015 Last Fill: 08/22/2014 #90 and 5 RF  Please advise.

## 2015-04-10 NOTE — Telephone Encounter (Signed)
Rx printed, awaiting MD signature.  

## 2015-04-10 NOTE — Telephone Encounter (Signed)
Rx faxed to [place].

## 2015-04-17 NOTE — Progress Notes (Signed)
HPI: FU atrial arrhythmias. Previously followed by Dr Ron Parker. Cardiac catheterization performed in 2001 because of abnormal nuclear study showed a 70% LAD, 80% circumflex and 80% right coronary artery. LV function normal. The patient had PCI of his circumflex and right coronary artery. Nuclear study 2010 normal. Echo 6/12 showed normal LV function, grade 2 diastolic dysfunction, mild biatrial enlargement. Event monitor January 2016 showed episode of coarse atrial fibrillation versus flutter. Since last seen, He has mild dyspnea on exertion but no orthopnea, PND, pedal edema, chest pain or syncope.  Current Outpatient Prescriptions  Medication Sig Dispense Refill  . Acetaminophen (TYLENOL 8 HOUR ARTHRITIS PAIN PO) Take 2 tablets by mouth daily.    Marland Kitchen ALPRAZolam (XANAX) 0.5 MG tablet Take 0.5-1 tablets (0.25-0.5 mg total) by mouth 3 (three) times daily as needed for anxiety. 270 tablet 1  . atorvastatin (LIPITOR) 80 MG tablet Take 1 tablet (80 mg total) by mouth daily. 90 tablet 2  . carisoprodol (SOMA) 350 MG tablet Take 1 tablet (350 mg total) by mouth 3 (three) times daily as needed for muscle spasms. 90 tablet 5  . Cholecalciferol (VITAMIN D) 2000 UNITS CAPS Take 1 capsule by mouth daily.    Marland Kitchen diltiazem (CARDIZEM CD) 180 MG 24 hr capsule Take 1 capsule (180 mg total) by mouth daily. 90 capsule 2  . fenofibrate 160 MG tablet Take 1 tablet (160 mg total) by mouth daily. 90 tablet 2  . glucose blood test strip Use as instructed 100 each 12  . HYDROcodone-acetaminophen (NORCO/VICODIN) 5-325 MG per tablet Take 1 tablet by mouth 3 (three) times daily as needed. 90 tablet 0  . Lancets (ONETOUCH ULTRASOFT) lancets Use as instructed 100 each 12  . metoprolol succinate (TOPROL-XL) 50 MG 24 hr tablet Take 1 tablet (50 mg total) by mouth daily. With or immediately following a meal. 90 tablet 2  . Multiple Vitamin (MULTIVITAMIN) tablet Take 1 tablet by mouth daily.      Marland Kitchen NICOTINE TD Place 1 patch onto  the skin daily as needed.    Marland Kitchen omeprazole (PRILOSEC) 20 MG capsule Take 1 capsule (20 mg total) by mouth daily. 90 capsule 2  . rivaroxaban (XARELTO) 20 MG TABS tablet Take 1 tablet (20 mg total) by mouth daily with supper. 30 tablet 5  . tolterodine (DETROL LA) 4 MG 24 hr capsule Take 4 mg by mouth daily.    . vitamin B-12 (CYANOCOBALAMIN) 500 MCG tablet Take 1,000 mcg by mouth daily.      No current facility-administered medications for this visit.     Past Medical History  Diagnosis Date  . Pulmonary nodule   . HTN (hypertension)   . CAD (coronary artery disease)     stent, 2001 / nuclear, June, 2010, no ischemia  . PAF (paroxysmal atrial fibrillation) ([place])   . Dyslipidemia   . Obesity   . Diverticulosis of colon   . History of colonic polyps   . BPH (benign prostatic hypertrophy)   . Testosterone deficiency   . ED (erectile dysfunction)   . Lumbar back pain   . Peripheral neuropathy (HCC)     lower extremities  . Anxiety and depression   . Vitamin D deficiency   . Blurred vision     episode with confusion, evaluated Dr. Erling Cruz  . Ejection fraction   . B12 deficiency   . Scalp lesion     November, 2011  . Atrial flutter ([place])     New diagnosis  November 10, 2010, rate controlled  . Tobacco use   . Prediabetes   . Sinus bradycardia   . Allergy   . Anxiety   . Arthritis   . GERD (gastroesophageal reflux disease)     pt denies, take omeprazole d/t taking several medications not GERD per pt  . Hyperlipidemia   . Stroke ([place])     tia  . OSA (obstructive sleep apnea)     Past Surgical History  Procedure Laterality Date  . Tonsillectomy    . Clavicle surgery      remotely  . Arm fracture surgery      remotely  . Colonoscopy    . Cardica stent x2      Social History   Social History  . Marital Status: Married    Spouse Name: N/A  . Number of Children: 0  . Years of Education: N/A   Occupational History  . [employer]    Social History Main Topics  . Smoking  status: Former Smoker -- 0.50 packs/day for 40 years    Types: Cigarettes    Quit date: 11/19/2014  . Smokeless tobacco: Never Used     Comment: using Nicotine patches  . Alcohol Use: 3.0 oz/week    5 Standard drinks or equivalent per week     Comment: rare  . Drug Use: No  . Sexual Activity: Not on file   Other Topics Concern  . Not on file   Social History Narrative   Lives w/ wife ,no children of his own,   wife's daughter-Gk live w/ them    ROS: arthralgias but no fevers or chills, productive cough, hemoptysis, dysphasia, odynophagia, melena, hematochezia, dysuria, hematuria, rash, seizure activity, orthopnea, PND, pedal edema, claudication. Remaining systems are negative.  Physical Exam: Well-developed well-nourished in no acute distress.  Skin is warm and dry.  HEENT is normal.  Neck is supple.  Chest is clear to auscultation with normal expansion.  Cardiovascular exam is regular rate and rhythm.  Abdominal exam nontender or distended. No masses palpated. Extremities show no edema. neuro grossly intact  ECG Sinus rhythm with first degree AV block, no ST changes.

## 2015-04-24 ENCOUNTER — Ambulatory Visit (INDEPENDENT_AMBULATORY_CARE_PROVIDER_SITE_OTHER): Payer: Medicare Other | Admitting: Cardiology

## 2015-04-24 ENCOUNTER — Encounter: Payer: Self-pay | Admitting: Cardiology

## 2015-04-24 VITALS — BP 151/92 | HR 60 | Ht 78.0 in | Wt 276.0 lb

## 2015-04-24 DIAGNOSIS — Z720 Tobacco use: Secondary | ICD-10-CM | POA: Diagnosis not present

## 2015-04-24 DIAGNOSIS — Z136 Encounter for screening for cardiovascular disorders: Secondary | ICD-10-CM | POA: Diagnosis not present

## 2015-04-24 DIAGNOSIS — R0989 Other specified symptoms and signs involving the circulatory and respiratory systems: Secondary | ICD-10-CM

## 2015-04-24 DIAGNOSIS — F17200 Nicotine dependence, unspecified, uncomplicated: Secondary | ICD-10-CM

## 2015-04-24 DIAGNOSIS — I2510 Atherosclerotic heart disease of native coronary artery without angina pectoris: Secondary | ICD-10-CM | POA: Diagnosis not present

## 2015-04-24 MED ORDER — LISINOPRIL 10 MG PO TABS: 10.0000 mg | ORAL_TABLET | Freq: Every day | ORAL | Status: DC

## 2015-04-24 NOTE — Assessment & Plan Note (Signed)
Schedule nuclear study for risk stratification. No aspirin given need for anticoagulation. Continue statin.

## 2015-04-24 NOTE — Assessment & Plan Note (Signed)
Scheduled abdominal ultrasound to exclude aneurysm. 

## 2015-04-24 NOTE — Assessment & Plan Note (Signed)
Patient remains in sinus rhythm. Continue beta blocker and calcium blocker. Continue xarelto. Monitor reviewed and there appears to be coarse atrial fibrillation as well.

## 2015-04-24 NOTE — Assessment & Plan Note (Signed)
Blood pressure elevated. Add lisinopril 10 mg daily. Check potassium and renal function in 1 week.

## 2015-04-24 NOTE — Patient Instructions (Signed)
Medication Instructions:   START LISINOPRIL 10 MG ONCE DAILY  Labwork:  Your physician recommends that you return for lab work in: [place]  Testing/Procedures:  Your physician has requested that you have a lexiscan myoview. For further information please visit [email or web address]. Please follow instruction sheet, as given.   Your physician has requested that you have an abdominal aorta duplex. During this test, an ultrasound is used to evaluate the aorta. Allow 30 minutes for this exam. Do not eat after midnight the day before and avoid carbonated beverages   Follow-Up:  Your physician wants you to follow-up in: [place] will receive a reminder letter in the mail two months in advance. If you don't receive a letter, please call our office to schedule the follow-up appointment.   If you need a refill on your cardiac medications before your next appointment, please call your pharmacy.

## 2015-04-24 NOTE — Assessment & Plan Note (Signed)
Patient counseled on discontinuing. 

## 2015-04-24 NOTE — Assessment & Plan Note (Signed)
Continue statin. 

## 2015-05-01 DIAGNOSIS — I2510 Atherosclerotic heart disease of native coronary artery without angina pectoris: Secondary | ICD-10-CM | POA: Diagnosis not present

## 2015-05-02 LAB — BASIC METABOLIC PANEL
BUN: 18 mg/dL (ref 7–25)
CHLORIDE: 102 mmol/L (ref 98–110)
CO2: 26 mmol/L (ref 20–31)
CREATININE: 1.05 mg/dL (ref 0.70–1.25)
Calcium: 9.8 mg/dL (ref 8.6–10.3)
Glucose, Bld: 92 mg/dL (ref 65–99)
Potassium: 4.5 mmol/L (ref 3.5–5.3)
Sodium: 136 mmol/L (ref 135–146)

## 2015-05-14 ENCOUNTER — Telehealth (HOSPITAL_COMMUNITY): Payer: Self-pay

## 2015-05-14 NOTE — Telephone Encounter (Signed)
Encounter complete. 

## 2015-05-16 ENCOUNTER — Ambulatory Visit (HOSPITAL_BASED_OUTPATIENT_CLINIC_OR_DEPARTMENT_OTHER)
Admission: RE | Admit: 2015-05-16 | Discharge: 2015-05-16 | Disposition: A | Discharge status: Home / Self Care | Payer: Medicare Other | Source: Ambulatory Visit | Attending: Cardiology | Admitting: Cardiology

## 2015-05-16 ENCOUNTER — Ambulatory Visit (HOSPITAL_COMMUNITY)
Admission: RE | Admit: 2015-05-16 | Discharge: 2015-05-16 | Disposition: A | Discharge status: Home / Self Care | Payer: Medicare Other | Source: Ambulatory Visit | Attending: Cardiovascular Disease | Admitting: Cardiovascular Disease

## 2015-05-16 DIAGNOSIS — Z6832 Body mass index (BMI) 32.0-32.9, adult: Secondary | ICD-10-CM | POA: Diagnosis not present

## 2015-05-16 DIAGNOSIS — Z87891 Personal history of nicotine dependence: Secondary | ICD-10-CM | POA: Diagnosis not present

## 2015-05-16 DIAGNOSIS — R0989 Other specified symptoms and signs involving the circulatory and respiratory systems: Secondary | ICD-10-CM | POA: Insufficient documentation

## 2015-05-16 DIAGNOSIS — E669 Obesity, unspecified: Secondary | ICD-10-CM | POA: Diagnosis not present

## 2015-05-16 DIAGNOSIS — I10 Essential (primary) hypertension: Secondary | ICD-10-CM | POA: Insufficient documentation

## 2015-05-16 DIAGNOSIS — F17200 Nicotine dependence, unspecified, uncomplicated: Secondary | ICD-10-CM

## 2015-05-16 DIAGNOSIS — R0609 Other forms of dyspnea: Secondary | ICD-10-CM | POA: Insufficient documentation

## 2015-05-16 DIAGNOSIS — I2510 Atherosclerotic heart disease of native coronary artery without angina pectoris: Secondary | ICD-10-CM

## 2015-05-16 DIAGNOSIS — Z136 Encounter for screening for cardiovascular disorders: Secondary | ICD-10-CM | POA: Diagnosis not present

## 2015-05-16 DIAGNOSIS — G4733 Obstructive sleep apnea (adult) (pediatric): Secondary | ICD-10-CM | POA: Insufficient documentation

## 2015-05-16 DIAGNOSIS — R9439 Abnormal result of other cardiovascular function study: Secondary | ICD-10-CM | POA: Insufficient documentation

## 2015-05-16 DIAGNOSIS — Z720 Tobacco use: Secondary | ICD-10-CM | POA: Diagnosis not present

## 2015-05-16 LAB — MYOCARDIAL PERFUSION IMAGING
CHL CUP NUCLEAR SSS: 2
CHL CUP RESTING HR STRESS: 52 {beats}/min
LV dias vol: 133 mL
LV sys vol: 48 mL
Peak HR: 73 {beats}/min
SDS: 1
SRS: 1
TID: 1.28

## 2015-05-16 MED ORDER — REGADENOSON 0.4 MG/5ML IV SOLN
0.4000 mg | Freq: Once | INTRAVENOUS | Status: AC
  Administered 2015-05-16: 0.4 mg via INTRAVENOUS

## 2015-05-16 MED ORDER — TECHNETIUM TC 99M SESTAMIBI GENERIC - CARDIOLITE
10.6000 | Freq: Once | INTRAVENOUS | Status: AC | PRN
  Administered 2015-05-16: 10.6 via INTRAVENOUS

## 2015-05-16 MED ORDER — TECHNETIUM TC 99M SESTAMIBI GENERIC - CARDIOLITE
32.0000 | Freq: Once | INTRAVENOUS | Status: AC | PRN
  Administered 2015-05-16: 32 via INTRAVENOUS

## 2015-05-21 DIAGNOSIS — H6123 Impacted cerumen, bilateral: Secondary | ICD-10-CM | POA: Diagnosis not present

## 2015-05-21 DIAGNOSIS — H903 Sensorineural hearing loss, bilateral: Secondary | ICD-10-CM | POA: Diagnosis not present

## 2015-05-21 DIAGNOSIS — H9313 Tinnitus, bilateral: Secondary | ICD-10-CM | POA: Diagnosis not present

## 2015-05-24 ENCOUNTER — Telehealth: Payer: Self-pay | Admitting: Internal Medicine

## 2015-05-24 MED ORDER — HYDROCODONE-ACETAMINOPHEN 5-325 MG PO TABS: 1.0000 | ORAL_TABLET | Freq: Three times a day (TID) | ORAL | Status: DC | PRN

## 2015-05-24 NOTE — Telephone Encounter (Signed)
Okay #90, 2 prescriptions 

## 2015-05-24 NOTE — Telephone Encounter (Signed)
Pt is requesting refill on Hydrocodone.  Last OV: 02/25/2015 Last Fill: 02/15/2015 #90 and 0RF UDS: 12/28/2014 Low risk  Please advise.

## 2015-05-24 NOTE — Telephone Encounter (Signed)
Rx for December 2016, and January 2017 printed, awaiting MD signature.

## 2015-05-24 NOTE — Telephone Encounter (Signed)
Patient informed. 

## 2015-05-24 NOTE — Telephone Encounter (Signed)
Relation to pt:self °Call back number:[phone] ° ° °Reason for call:  °Patient requesting a refill HYDROcodone-acetaminophen (NORCO/VICODIN) 5-325 MG per tablet  °

## 2015-05-24 NOTE — Telephone Encounter (Signed)
Please inform Pt that Rx is ready for pick up at the front desk at his convenience. Thank you.  

## 2015-06-27 ENCOUNTER — Telehealth: Payer: Self-pay | Admitting: Pulmonary Disease

## 2015-06-27 NOTE — Telephone Encounter (Signed)
[phone] pt calling back

## 2015-06-27 NOTE — Telephone Encounter (Signed)
lmtcb x1 for pt. 

## 2015-06-27 NOTE — Telephone Encounter (Signed)
Spoke with pt. States that when he saw RA last, he declined starting on CPAP therapy. He saw on TV the other day, an advertisement for CPAP Pro. Would like to get RA's recommendation on this.  RA - please advise. Thanks.

## 2015-07-02 ENCOUNTER — Ambulatory Visit: Payer: Medicare Other | Admitting: Internal Medicine

## 2015-07-03 NOTE — Telephone Encounter (Signed)
CPAP pro is simply a kind of nasal pillows interface. He still need to use the CPAP with it. It has a mouth piece that goes into the mouth-cannot be used for people with dental issues. In my opinion, he should try simple nasal pillows first He can watch this being used on youtube [email or web address]-CPAP [email or web address]

## 2015-07-03 NOTE — Telephone Encounter (Signed)
RA - please advise. Thanks! 

## 2015-07-04 ENCOUNTER — Encounter: Payer: Self-pay | Admitting: Internal Medicine

## 2015-07-04 MED ORDER — LOSARTAN POTASSIUM 25 MG PO TABS: 25.0000 mg | ORAL_TABLET | Freq: Every day | ORAL | Status: DC

## 2015-07-04 NOTE — Telephone Encounter (Signed)
Spoke with pt. He is aware of RA's recommendation. Pt does not want to start CPAP therapy at this time. Nothing further was needed.

## 2015-07-04 NOTE — Telephone Encounter (Signed)
Lisinopril d/c, Losartan 25 mg 1 tablet by mouth daily, #30 and 0RF sent to [place].

## 2015-07-04 NOTE — Telephone Encounter (Signed)
Left message for patient to call back  

## 2015-07-04 NOTE — Telephone Encounter (Signed)
Patient Returned call [phone]

## 2015-07-11 ENCOUNTER — Encounter: Payer: Self-pay | Admitting: Internal Medicine

## 2015-07-11 ENCOUNTER — Ambulatory Visit (INDEPENDENT_AMBULATORY_CARE_PROVIDER_SITE_OTHER): Payer: Medicare Other | Admitting: Internal Medicine

## 2015-07-11 VITALS — BP 128/80 | HR 66 | Temp 97.8°F | Ht 78.0 in | Wt 268.1 lb

## 2015-07-11 DIAGNOSIS — I2510 Atherosclerotic heart disease of native coronary artery without angina pectoris: Secondary | ICD-10-CM

## 2015-07-11 DIAGNOSIS — M545 Low back pain, unspecified: Secondary | ICD-10-CM

## 2015-07-11 DIAGNOSIS — E785 Hyperlipidemia, unspecified: Secondary | ICD-10-CM | POA: Diagnosis not present

## 2015-07-11 DIAGNOSIS — G6289 Other specified polyneuropathies: Secondary | ICD-10-CM | POA: Diagnosis not present

## 2015-07-11 DIAGNOSIS — G8929 Other chronic pain: Secondary | ICD-10-CM

## 2015-07-11 DIAGNOSIS — Z79891 Long term (current) use of opiate analgesic: Secondary | ICD-10-CM | POA: Diagnosis not present

## 2015-07-11 DIAGNOSIS — I10 Essential (primary) hypertension: Secondary | ICD-10-CM

## 2015-07-11 DIAGNOSIS — Z79899 Other long term (current) drug therapy: Secondary | ICD-10-CM | POA: Diagnosis not present

## 2015-07-11 LAB — LIPID PANEL
Cholesterol: 166 mg/dL (ref 0–200)
HDL: 38.2 mg/dL — AB (ref >39.00)
LDL Cholesterol: 110 mg/dL — ABNORMAL HIGH (ref 0–99)
NONHDL: 128.18
Total CHOL/HDL Ratio: 4
Triglycerides: 91 mg/dL (ref 0.0–149.0)
VLDL: 18.2 mg/dL (ref 0.0–40.0)

## 2015-07-11 NOTE — Progress Notes (Signed)
Pre visit review using our clinic review tool, if applicable. No additional management support is needed unless otherwise documented below in the visit note. 

## 2015-07-11 NOTE — Progress Notes (Signed)
Subjective:    Patient ID: Brendan [name], male    DOB: 1951-02-03, 65 y.o.   MRN: [id number]  DOS:  07/11/2015 Type of visit - description : Follow-up Interval history:  Was seen with a headache, workup negative. Headache resolved HTN: BP was ~ 150/90 when he saw cardiology, Rx lisinopril, developed cough, switched to losartan but has not started. Last dose of lisinopril 3 days ago. High cholesterol: Base on last panel was recommended Zetia, has no started. CAD: Note from cardiology reviewed. Pain management: Due for a UDS and contracted. Symptoms well-controlled.   Review of Systems  Denies chest pain, difficulty breathing. No nausea, vomiting, diarrhea.  Past Medical History  Diagnosis Date  . Pulmonary nodule   . HTN (hypertension)   . CAD (coronary artery disease)     stent, 2001 / nuclear, June, 2010, no ischemia  . PAF (paroxysmal atrial fibrillation) ([place])   . Dyslipidemia   . Obesity   . Diverticulosis of colon   . History of colonic polyps   . BPH (benign prostatic hypertrophy)   . Testosterone deficiency   . ED (erectile dysfunction)   . Lumbar back pain   . Peripheral neuropathy (HCC)     lower extremities  . Anxiety and depression   . Vitamin D deficiency   . Blurred vision     episode with confusion, evaluated Dr. Erling Cruz  . Ejection fraction   . B12 deficiency   . Scalp lesion     November, 2011  . Atrial flutter [facility])     New diagnosis November 10, 2010, rate controlled  . Tobacco use   . Prediabetes   . Sinus bradycardia   . Allergy   . Anxiety   . Arthritis   . GERD (gastroesophageal reflux disease)     pt denies, take omeprazole d/t taking several medications not GERD per pt  . Hyperlipidemia   . Stroke ([place])     tia  . OSA (obstructive sleep apnea)     Past Surgical History  Procedure Laterality Date  . Tonsillectomy    . Clavicle surgery      remotely  . Arm fracture surgery      remotely  . Colonoscopy    . Cardica stent x2       Social History   Social History  . Marital Status: Married    Spouse Name: N/A  . Number of Children: 0  . Years of Education: N/A   Occupational History  . [employer]    Social History Main Topics  . Smoking status: Former Smoker -- 0.50 packs/day for 40 years    Types: Cigarettes    Quit date: 11/19/2014  . Smokeless tobacco: Never Used     Comment: using Nicotine patches  . Alcohol Use: 3.0 oz/week    5 Standard drinks or equivalent per week     Comment: rare  . Drug Use: No  . Sexual Activity: Not on file   Other Topics Concern  . Not on file   Social History Narrative   Lives w/ wife ,no children of his own,   wife's daughter-Gk live w/ them        Medication List       This list is accurate as of: 07/11/15  7:55 PM.  Always use your most recent med list.               ALPRAZolam 0.5 MG tablet  Commonly known as:  Duanne Moron  Take 0.5-1 tablets (0.25-0.5 mg total) by mouth 3 (three) times daily as needed for anxiety.     atorvastatin 80 MG tablet  Commonly known as:  LIPITOR  Take 1 tablet (80 mg total) by mouth daily.     carisoprodol 350 MG tablet  Commonly known as:  SOMA  Take 1 tablet (350 mg total) by mouth 3 (three) times daily as needed for muscle spasms.     diltiazem 180 MG 24 hr capsule  Commonly known as:  CARDIZEM CD  Take 1 capsule (180 mg total) by mouth daily.     fenofibrate 160 MG tablet  Take 1 tablet (160 mg total) by mouth daily.     glucose blood test strip  Use as instructed     HYDROcodone-acetaminophen 5-325 MG tablet  Commonly known as:  NORCO/VICODIN  Take 1 tablet by mouth 3 (three) times daily as needed.     HYDROcodone-acetaminophen 5-325 MG tablet  Commonly known as:  NORCO/VICODIN  Take 1 tablet by mouth 3 (three) times daily as needed.     metoprolol succinate 50 MG 24 hr tablet  Commonly known as:  TOPROL-XL  Take 1 tablet (50 mg total) by mouth daily. With or immediately following a meal.     multivitamin  tablet  Take 1 tablet by mouth daily.     NICOTINE TD  Place 1 patch onto the skin daily as needed.     omeprazole 20 MG capsule  Commonly known as:  PRILOSEC  Take 1 capsule (20 mg total) by mouth daily.     onetouch ultrasoft lancets  Use as instructed     rivaroxaban 20 MG Tabs tablet  Commonly known as:  XARELTO  Take 1 tablet (20 mg total) by mouth daily with supper.     tolterodine 4 MG 24 hr capsule  Commonly known as:  DETROL LA  Take 4 mg by mouth daily.     vitamin B-12 500 MCG tablet  Commonly known as:  CYANOCOBALAMIN  Take 1,000 mcg by mouth daily.     Vitamin D 2000 units Caps  Take 1 capsule by mouth daily.           Objective:   Physical Exam BP 128/80 mmHg  Pulse 66  Temp(Src) 97.8 F (36.6 C) (Oral)  Ht 6\' 6"  (1.981 m)  Wt 268 lb 2 oz (121.621 kg)  BMI 30.99 kg/m2  SpO2 97% General:   Well developed, well nourished . NAD.  HEENT:  Normocephalic . Face symmetric, atraumatic Lungs:  CTA B Normal respiratory effort, no intercostal retractions, no accessory muscle use. Heart: RRR,  no murmur.  No pretibial edema bilaterally  Skin: Not pale. Not jaundice Neurologic:  alert & oriented X3.  Speech normal, gait appropriate for age and unassisted Psych--  Cognition and judgment appear intact.  Cooperative with normal attention span and concentration.  Behavior appropriate. No anxious or depressed appearing.      Assessment & Plan:  Assessment Prediabetes Peripheral neuropathy HTN Hyperlipidemia Anxiety depression -- on xanax  CV: Dr Stanford Breed --CAD. Stress test okay 05-2015 -- Paroxysmal Atrial fibrillation --TIA --Abdominal bruit: No AAA. Ultrasound 05/2015 OSA GU: BPH, Hypogonadism, ED. Sees urology B12 deficiency H/o pulmonary nodule, CXR 3-16 stable, no further schedule imaging Pain mngmt: has back-shoulder  pain and neuropathy  On etodolac, Soma and Vicodin. Etodolac DC 2016  per Dr Larose Kells d/t concerns regards anticoagulation.  Hydrocodone dose needed to be increased Has seen Dr. Erling Cruz several times since  2010:w/u  included SPE, ACE, TSH, B12 (slightly low with a normal methylmalonic acid) 08-2008 had a lumbar spine MRI with multilevel spondylosis Dr. 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 He feels that symptoms are well-controlled as long as he take his painkillers. HTN: BP recently elevated, was rx lisinopril >> cough, last  dose of lisinopril 3 days ago. Was Rx losartan, has not started yet, reluctant to take more medication. Plan: no  Losartan for now; monitor BPs. Hyperlipidemia: Based on last panel was recommended Zetia, has not started yet. Will check FLP. Further advice w/results Cardiovascular: Stable, negative stress test and aorta ultrasound recently.   Headache: CT, sedimentation rate negative. Headache resolved. RTC 4-5 months

## 2015-07-11 NOTE — Patient Instructions (Signed)
GO TO THE LAB : Get the blood work    GO TO THE FRONT DESK  Schedule a routine office visit or check up to be done in  4-5 months  Please be fasting  Front desk:    30   Check the  blood pressure 2 or 3 times a week Be sure your blood pressure is between 110/65 and  145/85. If it is consistently higher or lower, let me know  Do not start losartan

## 2015-07-12 MED ORDER — EZETIMIBE 10 MG PO TABS: 10.0000 mg | ORAL_TABLET | Freq: Every day | ORAL | Status: DC

## 2015-07-12 NOTE — Addendum Note (Signed)
Addended by: Damita Dunnings D on: 07/12/2015 10:26 AM   Modules accepted: Orders

## 2015-07-26 ENCOUNTER — Telehealth: Payer: Self-pay

## 2015-07-26 NOTE — Telephone Encounter (Signed)
UDS: 07/11/2015  Negative for Alprazolam: Rx'ed regularly Positive for Soma Positive for Norco   Low risk per Dr. Larose Kells 07/25/2015 Recheck 6 months

## 2015-08-09 ENCOUNTER — Other Ambulatory Visit: Payer: Self-pay | Admitting: Internal Medicine

## 2015-08-12 ENCOUNTER — Encounter: Payer: Self-pay | Admitting: Internal Medicine

## 2015-08-21 DIAGNOSIS — H6123 Impacted cerumen, bilateral: Secondary | ICD-10-CM | POA: Diagnosis not present

## 2015-08-21 DIAGNOSIS — H9313 Tinnitus, bilateral: Secondary | ICD-10-CM | POA: Diagnosis not present

## 2015-08-21 DIAGNOSIS — H903 Sensorineural hearing loss, bilateral: Secondary | ICD-10-CM | POA: Diagnosis not present

## 2015-08-22 ENCOUNTER — Other Ambulatory Visit: Payer: Self-pay | Admitting: Internal Medicine

## 2015-08-30 ENCOUNTER — Telehealth: Payer: Self-pay | Admitting: Internal Medicine

## 2015-08-30 MED ORDER — HYDROCODONE-ACETAMINOPHEN 5-325 MG PO TABS: 1.0000 | ORAL_TABLET | Freq: Three times a day (TID) | ORAL | Status: DC | PRN

## 2015-08-30 NOTE — Telephone Encounter (Signed)
Relationship to patient: Self  Can be reached: [phone]  Reason for call: Refill on HYDROcodone-acetaminophen  Rx.   Please call pt when ready for pick up   Thanks.

## 2015-08-30 NOTE — Telephone Encounter (Signed)
Please inform Pt that Rx has been placed at front desk for pick up at his convenience. Thank you.  

## 2015-08-30 NOTE — Telephone Encounter (Signed)
Rx printed for April, May and June 2017, awaiting MD signature.

## 2015-08-30 NOTE — Telephone Encounter (Signed)
Ok 90, 3 prescriptions

## 2015-08-30 NOTE — Telephone Encounter (Signed)
Pt is requesting refill on Hydrocodone.  Last OV: 07/11/2015 Last Fill: 05/24/2015 #90 and 0RF For December 2016 and January 2017 UDS: 07/11/2015 Low risk  Please advise.

## 2015-09-02 DIAGNOSIS — N401 Enlarged prostate with lower urinary tract symptoms: Secondary | ICD-10-CM | POA: Diagnosis not present

## 2015-09-02 DIAGNOSIS — Z0000 Encounter for general adult medical examination without abnormal findings: Secondary | ICD-10-CM | POA: Diagnosis not present

## 2015-09-02 DIAGNOSIS — N5201 Erectile dysfunction due to arterial insufficiency: Secondary | ICD-10-CM | POA: Diagnosis not present

## 2015-09-02 DIAGNOSIS — N138 Other obstructive and reflux uropathy: Secondary | ICD-10-CM | POA: Diagnosis not present

## 2015-09-03 NOTE — Telephone Encounter (Signed)
Called pt to inform.

## 2015-09-07 ENCOUNTER — Other Ambulatory Visit: Payer: Self-pay | Admitting: Internal Medicine

## 2015-09-09 NOTE — Telephone Encounter (Signed)
Pt is requesting refill on Alprazolam.  Last OV: 07/11/2015 Last Fill: 03/13/2015 #270 and 1RF Pt sig: 1/2-1 tablet TID PRN UDS: 07/11/2015 Low risk  Please advise.

## 2015-09-09 NOTE — Telephone Encounter (Signed)
Okay #270 and one refill

## 2015-09-09 NOTE — Telephone Encounter (Signed)
Rx printed, awaiting MD signature.  

## 2015-09-09 NOTE — Telephone Encounter (Signed)
Rx faxed to [facility].  

## 2015-10-14 ENCOUNTER — Telehealth: Payer: Self-pay | Admitting: Internal Medicine

## 2015-10-14 ENCOUNTER — Other Ambulatory Visit: Payer: Self-pay | Admitting: Internal Medicine

## 2015-10-14 ENCOUNTER — Ambulatory Visit (INDEPENDENT_AMBULATORY_CARE_PROVIDER_SITE_OTHER): Payer: Medicare Other | Admitting: Internal Medicine

## 2015-10-14 ENCOUNTER — Encounter: Payer: Self-pay | Admitting: Internal Medicine

## 2015-10-14 VITALS — BP 118/68 | HR 60 | Temp 97.9°F | Ht 78.0 in | Wt 270.0 lb

## 2015-10-14 DIAGNOSIS — E559 Vitamin D deficiency, unspecified: Secondary | ICD-10-CM | POA: Diagnosis not present

## 2015-10-14 DIAGNOSIS — E538 Deficiency of other specified B group vitamins: Secondary | ICD-10-CM | POA: Diagnosis not present

## 2015-10-14 DIAGNOSIS — R42 Dizziness and giddiness: Secondary | ICD-10-CM

## 2015-10-14 DIAGNOSIS — I2510 Atherosclerotic heart disease of native coronary artery without angina pectoris: Secondary | ICD-10-CM | POA: Diagnosis not present

## 2015-10-14 DIAGNOSIS — Z09 Encounter for follow-up examination after completed treatment for conditions other than malignant neoplasm: Secondary | ICD-10-CM

## 2015-10-14 LAB — CBC WITH DIFFERENTIAL/PLATELET
BASOS PCT: 0.5 % (ref 0.0–3.0)
Basophils Absolute: 0 10*3/uL (ref 0.0–0.1)
EOS ABS: 0.2 10*3/uL (ref 0.0–0.7)
Eosinophils Relative: 3.8 % (ref 0.0–5.0)
HCT: 46.4 % (ref 39.0–52.0)
HEMOGLOBIN: 15.9 g/dL (ref 13.0–17.0)
LYMPHS ABS: 2 10*3/uL (ref 0.7–4.0)
Lymphocytes Relative: 37.4 % (ref 12.0–46.0)
MCHC: 34.3 g/dL (ref 30.0–36.0)
MCV: 92 fl (ref 78.0–100.0)
MONO ABS: 0.4 10*3/uL (ref 0.1–1.0)
Monocytes Relative: 7.7 % (ref 3.0–12.0)
NEUTROS PCT: 50.6 % (ref 43.0–77.0)
Neutro Abs: 2.7 10*3/uL (ref 1.4–7.7)
PLATELETS: 249 10*3/uL (ref 150.0–400.0)
RBC: 5.04 Mil/uL (ref 4.22–5.81)
RDW: 13.2 % (ref 11.5–15.5)
WBC: 5.3 10*3/uL (ref 4.0–10.5)

## 2015-10-14 LAB — BASIC METABOLIC PANEL
BUN: 17 mg/dL (ref 6–23)
CHLORIDE: 105 meq/L (ref 96–112)
CO2: 25 mEq/L (ref 19–32)
CREATININE: 0.94 mg/dL (ref 0.40–1.50)
Calcium: 9.7 mg/dL (ref 8.4–10.5)
GFR: 85.17 mL/min (ref >60.00)
GLUCOSE: 97 mg/dL (ref 70–99)
Potassium: 4 mEq/L (ref 3.5–5.1)
Sodium: 138 mEq/L (ref 135–145)

## 2015-10-14 LAB — VITAMIN B12: VITAMIN B 12: 631 pg/mL (ref 211–911)

## 2015-10-14 MED ORDER — MECLIZINE HCL 12.5 MG PO TABS: 12.5000 mg | ORAL_TABLET | Freq: Three times a day (TID) | ORAL | Status: DC | PRN

## 2015-10-14 NOTE — Telephone Encounter (Signed)
Rx faxed to [facility].  

## 2015-10-14 NOTE — Assessment & Plan Note (Signed)
Dizziness: Dizziness as described above in a patient with history of CAD, TIA, anticoagulated. The neurological exam is reassuring , sx likely peripheral however other etiologies need to be ruled out such as posterior fossa stroke or vitamin deficits. Plan: Rest, fluids, Antivert, fall precautions, MRI of the brain. Also BMP and CBC Vitamin B12 - D  Deficiency:  check levels   RTC 12/03/2015 as schedule

## 2015-10-14 NOTE — Telephone Encounter (Signed)
Noted. Appointment with Dr. Larose Kells today at 1:00 PM.

## 2015-10-14 NOTE — Progress Notes (Signed)
Pre visit review using our clinic review tool, if applicable. No additional management support is needed unless otherwise documented below in the visit note. 

## 2015-10-14 NOTE — Telephone Encounter (Signed)
[facility] - Client TELEPHONE ADVICE RECORD [facility]  Patient Name: Brendan [name]  DOB: 16-Nov-1950    Initial Comment Caller states c/o dizziness   Nurse Assessment  Nurse: Wayne Sever, RN, Tillie Rung Date/Time (Eastern Time): 10/14/2015 9:00:55 AM  Confirm and document reason for call. If symptomatic, describe symptoms. You must click the next button to save text entered. ---Caller is c/o dizziness over the weekend. He states it started some time around Friday. He states it's not all the time, but dizziness on and off.  Has the patient traveled out of the country within the last 30 days? ---Not Applicable  Does the patient have any new or worsening symptoms? ---Yes  Will a triage be completed? ---Yes  Related visit to physician within the last 2 weeks? ---No  Does the PT have any chronic conditions? (i.e. diabetes, asthma, etc.) ---Yes  List chronic conditions. ---Stents in Heart, High Cholesterol, Arthritis  Is this a behavioral health or substance abuse call? ---No     Guidelines    Guideline Title Affirmed Question Affirmed Notes  Dizziness - Lightheadedness [1] MODERATE dizziness (e.g., interferes with normal activities) AND [2] has NOT been evaluated by physician for this (Exception: dizziness caused by heat exposure, sudden standing, or poor fluid intake)    Final Disposition User   See Physician within 24 Hours [place], RN, Tillie Rung    Comments  Scheduled with Dr Larose Kells today at [facility] [facility]   Disagree/Comply: Comply

## 2015-10-14 NOTE — Patient Instructions (Addendum)
GO TO THE LAB : Get the blood work     Will schedule a brain MRI  If your symptoms get worse, different, or not gradually better in the next few days : let me know  Rest, drink plenty of fluids and take Antivert as needed for dizziness. Antivert will cause some drowsiness. Be careful

## 2015-10-14 NOTE — Telephone Encounter (Signed)
[place] #90, 5 Rf

## 2015-10-14 NOTE — Telephone Encounter (Signed)
Pt is requesting refill on Soma.  Last OV: 10/14/2015 Last Fill: 04/10/2015 #90 and 5RF Pt sig: 1 tablet TID PRN   Please advise.

## 2015-10-14 NOTE — Progress Notes (Signed)
Subjective:    Patient ID: Brendan [name], male    DOB: [birth year], 65 y.o.   MRN: [id number]  DOS:  10/14/2015 Type of visit - description : Acute visit Interval history: About 2 weeks ago he experiencing some flashing  light at a right visual field. About 5 days ago started experiencing dizziness, it lasts few seconds, usually when he gets up off the chair or quickly gets out of bed. Also when he lies down. Symptoms last several seconds, then quickly resolved, usually has to "hold on" to something to steady himself up. Also dizzy when he looks up. No new medications.    Review of Systems Denies chest pain, difficulty breathing or palpitations No nausea vomiting No headache, neck pain or neck stiffness No recent fall or head injury No diplopia, no deficit, visual field defects. No slurred speech although his family told him that last month he "talked funny" for one day. Unable to describe this symptom further.  Past Medical History  Diagnosis Date  . Pulmonary nodule   . HTN (hypertension)   . CAD (coronary artery disease)     stent, 2001 / nuclear, June, 2010, no ischemia  . PAF (paroxysmal atrial fibrillation) ([place])   . Dyslipidemia   . Obesity   . Diverticulosis of colon   . History of colonic polyps   . BPH (benign prostatic hypertrophy)   . Testosterone deficiency   . ED (erectile dysfunction)   . Lumbar back pain   . Peripheral neuropathy (HCC)     lower extremities  . Anxiety and depression   . Vitamin D deficiency   . Blurred vision     episode with confusion, evaluated Dr. Erling Cruz  . Ejection fraction   . B12 deficiency   . Scalp lesion     November, 2011  . Atrial flutter [facility])     New diagnosis November 10, 2010, rate controlled  . Tobacco use   . Prediabetes   . Sinus bradycardia   . Allergy   . Anxiety   . Arthritis   . GERD (gastroesophageal reflux disease)     pt denies, take omeprazole d/t taking several medications not GERD per pt  .  Hyperlipidemia   . Stroke ([place])     tia  . OSA (obstructive sleep apnea)     Past Surgical History  Procedure Laterality Date  . Tonsillectomy    . Clavicle surgery      remotely  . Arm fracture surgery      remotely  . Colonoscopy    . Cardica stent x2      Social History   Social History  . Marital Status: Married    Spouse Name: N/A  . Number of Children: 0  . Years of Education: N/A   Occupational History  . AC[employer]    Social History Main Topics  . Smoking status: Former Smoker -- 0.50 packs/day for 40 years    Types: Cigarettes    Quit date: 11/19/2014  . Smokeless tobacco: Never Used     Comment: using Nicotine patches  . Alcohol Use: 3.0 oz/week    5 Standard drinks or equivalent per week     Comment: rare  . Drug Use: No  . Sexual Activity: Not on file   Other Topics Concern  . Not on file   Social History Narrative   Lives w/ wife ,no children of his own,   wife's daughter-Gk live w/ them  Medication List       This list is accurate as of: 10/14/15  4:49 PM.  Always use your most recent med list.               ALPRAZolam 0.5 MG tablet  Commonly known as:  XANAX  Take 0.5-1 tablets (0.25-0.5 mg total) by mouth 3 (three) times daily as needed for anxiety.     atorvastatin 80 MG tablet  Commonly known as:  LIPITOR  Take 1 tablet (80 mg total) by mouth daily.     carisoprodol 350 MG tablet  Commonly known as:  SOMA  Take 1 tablet (350 mg total) by mouth 3 (three) times daily as needed for muscle spasms.     diltiazem 180 MG 24 hr capsule  Commonly known as:  CARDIZEM CD  Take 1 capsule (180 mg total) by mouth daily.     ezetimibe 10 MG tablet  Commonly known as:  ZETIA  Take 1 tablet (10 mg total) by mouth daily.     fenofibrate 160 MG tablet  Take 1 tablet (160 mg total) by mouth daily.     glucose blood test strip  Use as instructed     HYDROcodone-acetaminophen 5-325 MG tablet  Commonly known as:  NORCO/VICODIN  Take 1  tablet by mouth 3 (three) times daily as needed.     HYDROcodone-acetaminophen 5-325 MG tablet  Commonly known as:  NORCO/VICODIN  Take 1 tablet by mouth 3 (three) times daily as needed.     HYDROcodone-acetaminophen 5-325 MG tablet  Commonly known as:  NORCO/VICODIN  Take 1 tablet by mouth 3 (three) times daily as needed.     meclizine 12.5 MG tablet  Commonly known as:  ANTIVERT  Take 1 tablet (12.5 mg total) by mouth 3 (three) times daily as needed for dizziness.     metoprolol succinate 50 MG 24 hr tablet  Commonly known as:  TOPROL-XL  Take 1 tablet (50 mg total) by mouth daily. With or immediately following a meal.     multivitamin tablet  Take 1 tablet by mouth daily.     NICOTINE TD  Place 1 patch onto the skin daily as needed.     omeprazole 20 MG capsule  Commonly known as:  PRILOSEC  Take 1 capsule (20 mg total) by mouth daily.     onetouch ultrasoft lancets  Use as instructed     rivaroxaban 20 MG Tabs tablet  Commonly known as:  XARELTO  Take 1 tablet (20 mg total) by mouth daily with supper.     tolterodine 4 MG 24 hr capsule  Commonly known as:  DETROL LA  Take 4 mg by mouth daily.     vitamin B-12 500 MCG tablet  Commonly known as:  CYANOCOBALAMIN  Take 1,000 mcg by mouth daily.     Vitamin D 2000 units Caps  Take 1 capsule by mouth daily.           Objective:   Physical Exam BP 118/68 mmHg  Pulse 60  Temp(Src) 97.9 F (36.6 C) (Oral)  Ht 6\' 6"  (1.981 m)  Wt 270 lb (122.471 kg)  BMI 31.21 kg/m2  SpO2 96%  General:   Well developed, well nourished . NAD.  HEENT:  Normocephalic . Face symmetric, atraumatic Neck: Normal carotid pulses Lungs:  CTA B Normal respiratory effort, no intercostal retractions, no accessory muscle use. Heart: RRR,  no murmur.  No pretibial edema bilaterally  Skin: Not pale. Not jaundice Neurologic:  alert & oriented X3.  Speech normal, gait appropriate for age and unassisted. External movements intact,  pupils equal and reactive. DTRs symmetric (decreased bilateral ankle jerk) Psych--  Cognition and judgment appear intact.  Cooperative with normal attention span and concentration.  Behavior appropriate. No anxious or depressed appearing.      Assessment & Plan:   Assessment Prediabetes Peripheral neuropathy HTN Hyperlipidemia Anxiety depression -- on xanax  CV: Dr Stanford Breed --CAD. Stress test okay 05-2015 -- Paroxysmal Atrial fibrillation --TIA --Abdominal bruit: No AAA. Ultrasound 05/2015 OSA GU: BPH, Hypogonadism, ED. Sees urology B12 deficiency H/o pulmonary nodule, CXR 3-16 stable, no further schedule imaging Pain mngmt: has back-shoulder  pain and neuropathy  On etodolac, Soma and Vicodin. Etodolac DC 2016  per Dr Larose Kells d/t concerns regards anticoagulation. Hydrocodone dose needed to be increased Has seen Dr. Erling Cruz several times since 2010:w/u  included SPE, ACE, TSH, B12 (slightly low with a normal methylmalonic acid) 08-2008 had a lumbar spine MRI with multilevel spondylosis Dr. 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 The neurological exam is reassuring , sx likely peripheral however other etiologies need to be ruled out such as posterior fossa stroke or vitamin deficits. Plan: Rest, fluids, Antivert, fall precautions, MRI of the brain. Also BMP and CBC Vitamin B12 - D  Deficiency:  check levels   RTC 12/03/2015 as schedule

## 2015-10-14 NOTE — Telephone Encounter (Signed)
Rx printed, awaiting MD signature.  

## 2015-10-14 NOTE — Telephone Encounter (Signed)
Pt called stating dizziness when looking up even in sitting position or when standing up from sitting position. States he has lost his balance a couple times but was able to avoid falling as he was able to grab a chair or wall to steady himself. Transferred to [place].

## 2015-10-15 ENCOUNTER — Telehealth: Payer: Self-pay | Admitting: Pulmonary Disease

## 2015-10-15 DIAGNOSIS — G4733 Obstructive sleep apnea (adult) (pediatric): Secondary | ICD-10-CM

## 2015-10-15 NOTE — Telephone Encounter (Signed)
Patient has decided to go on the CPAP instead of oral appliance. Dr. Elsworth Soho, please advise on settings for start of CPAP.

## 2015-10-16 DIAGNOSIS — G4733 Obstructive sleep apnea (adult) (pediatric): Secondary | ICD-10-CM | POA: Insufficient documentation

## 2015-10-16 NOTE — Telephone Encounter (Signed)
Send Rx for & let pt know pl - CPAP therapy on 9 cm H2O with a Large size Resmed Full Face Mask AirFit F10 mask and heated humidification. Download in 4 wks OV with TP in 6 wks

## 2015-10-16 NOTE — Telephone Encounter (Signed)
Spoke with the pt and notified of recs per RA  He verbalized understanding  Order sent to [facility] and f/u with TP scheduled

## 2015-10-17 LAB — VITAMIN D 1,25 DIHYDROXY
VITAMIN D 1, 25 (OH) TOTAL: 43 pg/mL (ref 18–72)
VITAMIN D3 1, 25 (OH): 43 pg/mL
Vitamin D2 1, 25 (OH)2: <8 pg/mL

## 2015-10-18 ENCOUNTER — Telehealth: Payer: Self-pay | Admitting: Pulmonary Disease

## 2015-10-18 NOTE — Telephone Encounter (Signed)
Spoke with pt. States that he was contacted by [facility] about his CPAP machine. His sleep study is more than 6 months old, so Medicare is requiring that he repeat his sleep study. He is not willing to do this at this time. Advised him that if he changed his mind to let us know. Nothing further was needed at this time.

## 2015-10-19 ENCOUNTER — Ambulatory Visit (HOSPITAL_BASED_OUTPATIENT_CLINIC_OR_DEPARTMENT_OTHER)
Admission: RE | Admit: 2015-10-19 | Discharge: 2015-10-19 | Disposition: A | Discharge status: Home / Self Care | Payer: Medicare Other | Source: Ambulatory Visit | Attending: Internal Medicine | Admitting: Internal Medicine

## 2015-10-19 DIAGNOSIS — R42 Dizziness and giddiness: Secondary | ICD-10-CM | POA: Insufficient documentation

## 2015-10-23 ENCOUNTER — Encounter: Payer: Self-pay | Admitting: Internal Medicine

## 2015-10-23 ENCOUNTER — Telehealth: Payer: Self-pay | Admitting: *Deleted

## 2015-10-23 DIAGNOSIS — R42 Dizziness and giddiness: Secondary | ICD-10-CM

## 2015-10-23 NOTE — Telephone Encounter (Signed)
Patient has been notified and has chosen not to undergo sleep study at this time,  Will call back if he changes his mind.  Melissa advised. Nothing further needed.

## 2015-10-23 NOTE — Telephone Encounter (Signed)
-----   Message from Darlina Guys sent at 10/18/2015 10:39 AM EDT ----- There is a new CPAP order for this pt however, he has Medicare.  They require that the order be within 6 months of the sleep test.  This pt's titration was done on 03/11/15. At that time, he chose not to move forward with PAP.  It has now been longer than 6 months so Medicare will require that he have a new office visit and sleep study.  Let me know if you have any questions.  Thanks [employer]

## 2015-10-24 NOTE — Telephone Encounter (Signed)
Enter a ENT referral, DX dizziness  Received: Today    Colon Branch, MD  Damita Dunnings, CMA   Referral placed.

## 2015-10-29 DIAGNOSIS — R42 Dizziness and giddiness: Secondary | ICD-10-CM | POA: Diagnosis not present

## 2015-10-29 DIAGNOSIS — H8111 Benign paroxysmal vertigo, right ear: Secondary | ICD-10-CM | POA: Diagnosis not present

## 2015-11-01 DIAGNOSIS — M2040 Other hammer toe(s) (acquired), unspecified foot: Secondary | ICD-10-CM

## 2015-11-08 ENCOUNTER — Other Ambulatory Visit: Payer: Self-pay | Admitting: Internal Medicine

## 2015-11-15 ENCOUNTER — Other Ambulatory Visit: Payer: Self-pay | Admitting: Internal Medicine

## 2015-11-22 ENCOUNTER — Other Ambulatory Visit: Payer: Self-pay | Admitting: Internal Medicine

## 2015-11-28 DIAGNOSIS — H2513 Age-related nuclear cataract, bilateral: Secondary | ICD-10-CM | POA: Diagnosis not present

## 2015-11-28 DIAGNOSIS — H35373 Puckering of macula, bilateral: Secondary | ICD-10-CM | POA: Diagnosis not present

## 2015-11-28 DIAGNOSIS — H43813 Vitreous degeneration, bilateral: Secondary | ICD-10-CM | POA: Diagnosis not present

## 2015-11-28 DIAGNOSIS — H52223 Regular astigmatism, bilateral: Secondary | ICD-10-CM | POA: Diagnosis not present

## 2015-11-28 DIAGNOSIS — H5213 Myopia, bilateral: Secondary | ICD-10-CM | POA: Diagnosis not present

## 2015-11-28 DIAGNOSIS — H524 Presbyopia: Secondary | ICD-10-CM | POA: Diagnosis not present

## 2015-12-03 ENCOUNTER — Ambulatory Visit (INDEPENDENT_AMBULATORY_CARE_PROVIDER_SITE_OTHER): Payer: Medicare Other | Admitting: Internal Medicine

## 2015-12-03 ENCOUNTER — Encounter: Payer: Self-pay | Admitting: Internal Medicine

## 2015-12-03 VITALS — BP 124/78 | HR 61 | Temp 98.2°F | Ht 78.0 in | Wt 270.2 lb

## 2015-12-03 DIAGNOSIS — L409 Psoriasis, unspecified: Secondary | ICD-10-CM

## 2015-12-03 DIAGNOSIS — F17200 Nicotine dependence, unspecified, uncomplicated: Secondary | ICD-10-CM

## 2015-12-03 DIAGNOSIS — I2510 Atherosclerotic heart disease of native coronary artery without angina pectoris: Secondary | ICD-10-CM | POA: Diagnosis not present

## 2015-12-03 DIAGNOSIS — M545 Low back pain: Secondary | ICD-10-CM

## 2015-12-03 DIAGNOSIS — E785 Hyperlipidemia, unspecified: Secondary | ICD-10-CM

## 2015-12-03 DIAGNOSIS — G8929 Other chronic pain: Secondary | ICD-10-CM

## 2015-12-03 MED ORDER — FLUOCINONIDE 0.05 % EX SOLN: 1.0000 "application " | Freq: Every day | CUTANEOUS | Status: DC

## 2015-12-03 NOTE — Progress Notes (Signed)
Subjective:    Patient ID: Brendan [name], male    DOB: 1950-09-27, 65 y.o.   MRN: [id number]  DOS:  12/03/2015 Type of visit - description : Routine visit Interval history: Trying to quit tobacco, using the patch and actually no cigarettes in the last 8 days Pain management: using soma/Vicodin  as needed only, reports that it wasn't helping a great deal and without in the pain is only slightly worse. High cholesterol: Unable to afford Zetia Has a chronic rash, right scalp, previously use topical steroid from a friend which completely clear the area and likes a RX.     Review of Systems  Denies chest pain, difficulty breathing No nausea, vomiting, diarrhea.   Past Medical History  Diagnosis Date  . Pulmonary nodule   . HTN (hypertension)   . CAD (coronary artery disease)     stent, 2001 / nuclear, June, 2010, no ischemia  . PAF (paroxysmal atrial fibrillation) ([place])   . Dyslipidemia   . Obesity   . Diverticulosis of colon   . History of colonic polyps   . BPH (benign prostatic hypertrophy)   . Testosterone deficiency   . ED (erectile dysfunction)   . Lumbar back pain   . Peripheral neuropathy (HCC)     lower extremities  . Anxiety and depression   . Vitamin D deficiency   . Blurred vision     episode with confusion, evaluated Dr. Erling Cruz  . Ejection fraction   . B12 deficiency   . Scalp lesion     November, 2011  . Atrial flutter [facility])     New diagnosis November 10, 2010, rate controlled  . Tobacco use   . Prediabetes   . Sinus bradycardia   . Allergy   . Anxiety   . Arthritis   . GERD (gastroesophageal reflux disease)     pt denies, take omeprazole d/t taking several medications not GERD per pt  . Hyperlipidemia   . Stroke ([place])     tia  . OSA (obstructive sleep apnea)     Past Surgical History  Procedure Laterality Date  . Tonsillectomy    . Clavicle surgery      remotely  . Arm fracture surgery      remotely  . Colonoscopy    . Cardica stent x2       Social History   Social History  . Marital Status: Married    Spouse Name: N/A  . Number of Children: 0  . Years of Education: N/A   Occupational History  . [employer]    Social History Main Topics  . Smoking status: Former Smoker -- 0.50 packs/day for 40 years    Types: Cigarettes    Quit date: 11/19/2014  . Smokeless tobacco: Never Used     Comment: using Nicotine patches  . Alcohol Use: 3.0 oz/week    5 Standard drinks or equivalent per week     Comment: rare  . Drug Use: No  . Sexual Activity: Not on file   Other Topics Concern  . Not on file   Social History Narrative   Lives w/ wife, he is the caregiver, she has muscle dysthrophy   No children of his own,   wife's daughter-Gk live w/ them            Medication List       This list is accurate as of: 12/03/15  5:23 PM.  Always use your most recent med list.  ALPRAZolam 0.5 MG tablet  Commonly known as:  XANAX  Take 0.5-1 tablets (0.25-0.5 mg total) by mouth 3 (three) times daily as needed for anxiety.     atorvastatin 80 MG tablet  Commonly known as:  LIPITOR  Take 1 tablet (80 mg total) by mouth daily.     carisoprodol 350 MG tablet  Commonly known as:  SOMA  Take 1 tablet (350 mg total) by mouth 3 (three) times daily as needed for muscle spasms.     diltiazem 180 MG 24 hr capsule  Commonly known as:  CARDIZEM CD  Take 1 capsule (180 mg total) by mouth daily.     fenofibrate 160 MG tablet  Take 1 tablet (160 mg total) by mouth daily.     fluocinonide 0.05 % external solution  Commonly known as:  LIDEX  Apply 1 application topically daily.     glucose blood test strip  Use as instructed     HYDROcodone-acetaminophen 5-325 MG tablet  Commonly known as:  NORCO/VICODIN  Take 1 tablet by mouth 3 (three) times daily as needed.     HYDROcodone-acetaminophen 5-325 MG tablet  Commonly known as:  NORCO/VICODIN  Take 1 tablet by mouth 3 (three) times daily as needed.      HYDROcodone-acetaminophen 5-325 MG tablet  Commonly known as:  NORCO/VICODIN  Take 1 tablet by mouth 3 (three) times daily as needed.     meclizine 12.5 MG tablet  Commonly known as:  ANTIVERT  Take 1 tablet (12.5 mg total) by mouth 3 (three) times daily as needed for dizziness.     metoprolol succinate 50 MG 24 hr tablet  Commonly known as:  TOPROL-XL  Take 1 tablet (50 mg total) by mouth daily. With or immediately following a meal.     multivitamin tablet  Take 1 tablet by mouth daily.     NICOTINE TD  Place 1 patch onto the skin daily as needed.     omeprazole 20 MG capsule  Commonly known as:  PRILOSEC  Take 1 capsule (20 mg total) by mouth daily.     onetouch ultrasoft lancets  Use as instructed     rivaroxaban 20 MG Tabs tablet  Commonly known as:  XARELTO  Take 1 tablet (20 mg total) by mouth daily with supper.     tolterodine 4 MG 24 hr capsule  Commonly known as:  DETROL LA  Take 4 mg by mouth daily.     vitamin B-12 500 MCG tablet  Commonly known as:  CYANOCOBALAMIN  Take 1,000 mcg by mouth daily.     Vitamin D 2000 units Caps  Take 1 capsule by mouth daily.           Objective:   Physical Exam  HENT:  Head:     BP 124/78 mmHg  Pulse 61  Temp(Src) 98.2 F (36.8 C) (Oral)  Ht 6\' 6"  (1.981 m)  Wt 270 lb 4 oz (122.585 kg)  BMI 31.24 kg/m2  SpO2 97% General:   Well developed, well nourished . NAD.  HEENT:  Normocephalic . Face symmetric, atraumatic Lungs:  CTA B Normal respiratory effort, no intercostal retractions, no accessory muscle use. Heart: Regular?,  no murmur.  No pretibial edema bilaterally  Neurologic:  alert & oriented X3.  Speech normal, gait appropriate for age and unassisted Psych--  Cognition and judgment appear intact.  Cooperative with normal attention span and concentration.  Behavior appropriate. No anxious or depressed appearing.      Assessment &  Plan:   Assessment Prediabetes Peripheral neuropathy (no pain,  insensitive type) HTN Hyperlipidemia Anxiety depression -- on xanax  CV: Dr Stanford Breed --CAD. Stress test okay 05-2015 -- Paroxysmal Atrial fibrillation --TIA --Abdominal bruit: No AAA. Ultrasound 05/2015 OSA GU: BPH, Hypogonadism, ED. Sees urology B12 deficiency H/o pulmonary nodule, CXR 3-16 stable, no further schedule imaging Pain mngmt: has back-shoulder  pain and neuropathy  On etodolac, Soma and Vicodin. Etodolac DC 2016  per Dr Larose Kells d/t concerns regards anticoagulation. Hydrocodone dose needed to be increased Has seen Dr. Erling Cruz several times since 2010:w/u  included SPE, ACE, TSH, B12 (slightly low with a normal methylmalonic acid) 08-2008 had a lumbar spine MRI with multilevel spondylosis Dr. 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 Pain management: Vicodin/Soma was not helping as much as he liked to thus he is now taking it as needed only and the pain is not much worse. I praised  for his decision. He also has neuropathy, insensitive type. At some point we could try gabapentin  as a way to help with chronic pain. Psoriatic patch: Patch of silver scales looks like psoriasis. Previously he self prescribed topical steroids with excellent response. I recommend a dermatology referral but the patient is quite hesitant, will rx lidex as a trial Hyperlipidemia: Unable to afford Zetia RTC 3 months, fasting.

## 2015-12-03 NOTE — Progress Notes (Signed)
Pre visit review using our clinic review tool, if applicable. No additional management support is needed unless otherwise documented below in the visit note. 

## 2015-12-03 NOTE — Assessment & Plan Note (Signed)
Tobacco abuse:  Praised for his decision to quit, he is using a nicotine patch. Pain management: Vicodin/Soma was not helping as much as he liked to thus he is now taking it as needed only and the pain is not much worse. I praised  for his decision. He also has neuropathy, insensitive type. At some point we could try gabapentin  as a way to help with chronic pain. Psoriatic patch: Patch of silver scales looks like psoriasis. Previously he self prescribed topical steroids with excellent response. I recommend a dermatology referral but the patient is quite hesitant, will rx lidex as a trial Hyperlipidemia: Unable to afford Zetia RTC 3 months, fasting.

## 2015-12-03 NOTE — Patient Instructions (Addendum)
GO TO THE FRONT DESK Schedule your next appointment for a  Check up in 3 months , FASTING

## 2015-12-09 ENCOUNTER — Ambulatory Visit: Payer: Medicare Other | Admitting: Adult Health

## 2016-02-26 ENCOUNTER — Other Ambulatory Visit: Payer: Self-pay | Admitting: Internal Medicine

## 2016-03-02 ENCOUNTER — Encounter: Payer: Self-pay | Admitting: Adult Health

## 2016-03-02 ENCOUNTER — Ambulatory Visit (INDEPENDENT_AMBULATORY_CARE_PROVIDER_SITE_OTHER): Payer: Medicare Other | Admitting: Internal Medicine

## 2016-03-02 ENCOUNTER — Encounter: Payer: Self-pay | Admitting: Internal Medicine

## 2016-03-02 VITALS — BP 122/72 | HR 67 | Temp 97.9°F | Wt 268.2 lb

## 2016-03-02 DIAGNOSIS — Z23 Encounter for immunization: Secondary | ICD-10-CM | POA: Diagnosis not present

## 2016-03-02 DIAGNOSIS — I10 Essential (primary) hypertension: Secondary | ICD-10-CM

## 2016-03-02 DIAGNOSIS — I2510 Atherosclerotic heart disease of native coronary artery without angina pectoris: Secondary | ICD-10-CM | POA: Diagnosis not present

## 2016-03-02 DIAGNOSIS — E785 Hyperlipidemia, unspecified: Secondary | ICD-10-CM

## 2016-03-02 DIAGNOSIS — F341 Dysthymic disorder: Secondary | ICD-10-CM

## 2016-03-02 MED ORDER — KETOCONAZOLE 2 % EX CREA: 1.0000 "application " | TOPICAL_CREAM | Freq: Every day | CUTANEOUS | 0 refills | Status: DC

## 2016-03-02 NOTE — Progress Notes (Signed)
Pre visit review using our clinic review tool, if applicable. No additional management support is needed unless otherwise documented below in the visit note. 

## 2016-03-02 NOTE — Assessment & Plan Note (Signed)
HTN: Seems controlled, recent BMP satisfactory. No change Hyperlipidemia: Not under ideal control, unable to afford Zetia, continue Lipitor 80 mg daily and fenofibrate. Anxiety depression: PHQ 9= scored 8 Having problems with family situation, financial issues. Counseled, declined the need to take medication (other than xanax) , encouraged to see a Medical illustrator. Rash: Likely fungal, rx nizoral, if not better let me know OSA: Untreated, encouraged to continue discussing the issue with his pulmonologist and dentist Flu shot today RTC 3 months, CPX

## 2016-03-02 NOTE — Patient Instructions (Signed)
GO TO THE FRONT DESK Schedule your next appointment for a  physical exam in 3 months, fasting   Please consider see one of our counselors  I sent a prescription for a cream called Nizoral: Apply twice a day for 10 days, after that, keep the area dry with talc or powder. If you are not better let me know

## 2016-03-02 NOTE — Progress Notes (Signed)
Subjective:    Patient ID: Brendan [name], male    DOB: [birth year], 65 y.o.   MRN: [id number]  DOS:  03/02/2016 Type of visit - description : rov Interval history: High cholesterol: I recommended Zetia based on the last labs,  has been unable to afford it. Anxiety, depression: Increased, 2 of his wife's children (72 and 92 years old, 1 drinks excessively)  ared now living at his house. No  suicidal ideas. OSA: Currently untreated, snoring is an issue and is disturbing his life. Rash for 3 months at the left chest. OTCs not helping.  Review of Systems   Past Medical History:  Diagnosis Date  . Allergy   . Anxiety   . Anxiety and depression   . Arthritis   . Atrial flutter [facility])    New diagnosis November 10, 2010, rate controlled  . B12 deficiency   . Blurred vision    episode with confusion, evaluated Dr. Erling Cruz  . BPH (benign prostatic hypertrophy)   . CAD (coronary artery disease)    stent, 2001 / nuclear, June, 2010, no ischemia  . Diverticulosis of colon   . Dyslipidemia   . ED (erectile dysfunction)   . Ejection fraction   . GERD (gastroesophageal reflux disease)    pt denies, take omeprazole d/t taking several medications not GERD per pt  . History of colonic polyps   . HTN (hypertension)   . Hyperlipidemia   . Lumbar back pain   . Obesity   . OSA (obstructive sleep apnea)   . PAF (paroxysmal atrial fibrillation) ([place])   . Peripheral neuropathy (HCC)    lower extremities  . Prediabetes   . Pulmonary nodule   . Scalp lesion    November, 2011  . Sinus bradycardia   . Stroke ([place])    tia  . Testosterone deficiency   . Tobacco use   . Vitamin D deficiency     Past Surgical History:  Procedure Laterality Date  . arm fracture surgery     remotely  . cardica stent x2    . CLAVICLE SURGERY     remotely  . COLONOSCOPY    . TONSILLECTOMY      Social History   Social History  . Marital status: Married    Spouse name: N/A  . Number of children: 0  . Years  of education: N/A   Occupational History  . [employer]   Social History Main Topics  . Smoking status: Former Smoker    Packs/day: 0.50    Years: 40.00    Types: Cigarettes    Quit date: 11/19/2014  . Smokeless tobacco: Never Used     Comment: using Nicotine patches  . Alcohol use 3.0 oz/week    5 Standard drinks or equivalent per week     Comment: rare  . Drug use: No  . Sexual activity: Not on file   Other Topics Concern  . Not on file   Social History Narrative   Lives w/ wife, he is the caregiver, she has muscle dysthrophy   No children of his own,   wife's daughter-Gk live w/ them            Medication List       Accurate as of 03/02/16  9:33 AM. Always use your most recent med list.          ALPRAZolam 0.5 MG tablet Commonly known as:  XANAX Take 0.5-1 tablets (0.25-0.5 mg total) by mouth  3 (three) times daily as needed for anxiety.   atorvastatin 80 MG tablet Commonly known as:  LIPITOR Take 1 tablet (80 mg total) by mouth daily.   carisoprodol 350 MG tablet Commonly known as:  SOMA Take 1 tablet (350 mg total) by mouth 3 (three) times daily as needed for muscle spasms.   diltiazem 180 MG 24 hr capsule Commonly known as:  CARDIZEM CD Take 1 capsule (180 mg total) by mouth daily.   fenofibrate 160 MG tablet Take 1 tablet (160 mg total) by mouth daily.   fluocinonide 0.05 % external solution Commonly known as:  LIDEX Apply 1 application topically daily.   glucose blood test strip Use as instructed   HYDROcodone-acetaminophen 5-325 MG tablet Commonly known as:  NORCO/VICODIN Take 1 tablet by mouth 3 (three) times daily as needed.   HYDROcodone-acetaminophen 5-325 MG tablet Commonly known as:  NORCO/VICODIN Take 1 tablet by mouth 3 (three) times daily as needed.   HYDROcodone-acetaminophen 5-325 MG tablet Commonly known as:  NORCO/VICODIN Take 1 tablet by mouth 3 (three) times daily as needed.   metoprolol succinate 50  MG 24 hr tablet Commonly known as:  TOPROL-XL Take 1 tablet (50 mg total) by mouth daily. With or immediately following a meal.   multivitamin tablet Take 1 tablet by mouth daily.   NICOTINE TD Place 1 patch onto the skin daily as needed.   omeprazole 20 MG capsule Commonly known as:  PRILOSEC Take 1 capsule (20 mg total) by mouth daily.   onetouch ultrasoft lancets Use as instructed   rivaroxaban 20 MG Tabs tablet Commonly known as:  XARELTO Take 1 tablet (20 mg total) by mouth daily with supper.   tolterodine 4 MG 24 hr capsule Commonly known as:  DETROL LA Take 4 mg by mouth daily.   vitamin B-12 500 MCG tablet Commonly known as:  CYANOCOBALAMIN Take 1,000 mcg by mouth daily.   Vitamin D 2000 units Caps Take 1 capsule by mouth daily.          Objective:   Physical Exam  Skin:      BP 122/72 (BP Location: Left Arm, Patient Position: Sitting, Cuff Size: Normal)   Pulse 67   Temp 97.9 F (36.6 C) (Oral)   Wt 268 lb 3.2 oz (121.7 kg)   SpO2 98%   BMI 30.99 kg/m  General:   Well developed, well nourished . NAD.  HEENT:  Normocephalic . Face symmetric, atraumatic Lungs:  CTA B Normal respiratory effort, no intercostal retractions, no accessory muscle use. Heart: RRR,  no murmur.  No pretibial edema bilaterally  Skin: Not pale. Not jaundice Neurologic:  alert & oriented X3.  Speech normal, gait appropriate for age and unassisted Psych--  Cognition and judgment appear intact.  Cooperative with normal attention span and concentration.  Behavior appropriate. Slightly anxious but no   depressed appearing.      Assessment & Plan:   Assessment Prediabetes Peripheral neuropathy (no pain, insensitive type) HTN Hyperlipidemia Anxiety depression -- on xanax  CV: Dr Stanford Breed --CAD. Stress test okay 05-2015 -- Paroxysmal Atrial fibrillation --TIA --Abdominal bruit: No AAA. Ultrasound 05/2015 OSA - was rx Cpap  GU: BPH, Hypogonadism, ED. Sees  urology B12 deficiency H/o pulmonary nodule, CXR 3-16 stable, no further schedule imaging Pain mngmt: has back-shoulder  pain and neuropathy  On etodolac, Soma and Vicodin. Etodolac DC 2016  per Dr Larose Kells d/t concerns regards anticoagulation. Hydrocodone dose needed to be increased Has seen Dr. Erling Cruz several times  since 2010:w/u  included SPE, ACE, TSH, B12 (slightly low with a normal methylmalonic acid) 08-2008 had a lumbar spine MRI with multilevel spondylosis Dr. 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 No change Hyperlipidemia: Not under ideal control, unable to afford Zetia, continue Lipitor 80 mg daily and fenofibrate. Anxiety depression: PHQ 9= scored 8 Having problems with family situation, financial issues. Counseled, declined the need to take medication (other than xanax) , encouraged to see a Medical illustrator. Rash: Likely fungal, rx nizoral, if not better let me know OSA: Untreated, encouraged to continue discussing the issue with his pulmonologist and dentist Flu shot today RTC 3 months, CPX

## 2016-03-02 NOTE — Telephone Encounter (Signed)
Per the 10.6.16 phone note: Brendan Noel, MD    CPAP experience can be better in home setting Can certainly try dental device, if he absolutely does not want cpap Have him find out from his dentist - if he does no, can refer him to dr Ron Parker     Dr Elsworth Soho, it looks like pt has gone back and forth about beginning CPAP since his sleep study on 10.3.16 Per his e-mail today, his dentist is offering a mouthpiece called MicrO2 and snore device but he remembers RA not recommending this and he now would like to know "why not?" He has not been seen in the office since 6.16.16  Please advise, thank you.

## 2016-03-03 NOTE — Telephone Encounter (Signed)
Per my note on 03/14/15 >>  CPAP experience can be better in home setting Can certainly try dental device, if he absolutely does not want cpap   Pl make office appt if any other questions to discuss

## 2016-03-04 NOTE — Telephone Encounter (Signed)
E-mail sent to patient with RA's response as below Will sign off

## 2016-03-12 ENCOUNTER — Telehealth: Payer: Self-pay

## 2016-03-12 MED ORDER — ALPRAZOLAM 0.5 MG PO TABS: 0.2500 mg | ORAL_TABLET | Freq: Three times a day (TID) | ORAL | 1 refills | Status: DC | PRN

## 2016-03-12 NOTE — Telephone Encounter (Signed)
Rx faxed to [facility].  

## 2016-03-12 NOTE — Telephone Encounter (Signed)
Rx printed, awaiting MD signature.  

## 2016-03-12 NOTE — Telephone Encounter (Signed)
Pt is requesting refill on Alprazolam.  Last OV: 03/02/2016 Last Fill: 09/09/2015 #270 and 1RF UDS: 07/11/2015 Low risk  Please advise.

## 2016-03-12 NOTE — Telephone Encounter (Signed)
Okay #270, 1 refill

## 2016-03-18 ENCOUNTER — Encounter: Payer: Self-pay | Admitting: Internal Medicine

## 2016-03-19 ENCOUNTER — Encounter: Payer: Self-pay | Admitting: Internal Medicine

## 2016-03-21 ENCOUNTER — Encounter: Payer: Self-pay | Admitting: Cardiology

## 2016-03-24 ENCOUNTER — Telehealth: Payer: Self-pay | Admitting: Pulmonary Disease

## 2016-03-24 DIAGNOSIS — G4733 Obstructive sleep apnea (adult) (pediatric): Secondary | ICD-10-CM

## 2016-03-24 NOTE — Telephone Encounter (Signed)
Spoke with August at Dr. Roxy Manns office, patient contacted their office to get an oral appliance for sleep apnea because he states he cannot tolerate the CPAP machine.  August is calling to get order for oral appliance and copy of sleep study. Order placed for Oral appliance.  Nothing further needed.

## 2016-04-12 NOTE — Progress Notes (Signed)
HPI: FU atrial arrhythmias. Cardiac catheterization performed in 2001 because of abnormal nuclear study showed a 70% LAD, 80% circumflex and 80% right coronary artery. LV function normal. The patient had PCI of his circumflex and right coronary artery. Echo 6/12 showed normal LV function, grade 2 diastolic dysfunction, mild biatrial enlargement. Event monitor January 2016 showed episode of coarse atrial fibrillation versus flutter. Nuclear study December 2016 showed ejection fraction 64%, inferior thinning and no ischemia. Abdominal ultrasound December 2016 showed no aneurysm. Since last seen, the patient denies any dyspnea on exertion, orthopnea, PND, pedal edema, palpitations, syncope or chest pain.   Current Outpatient Prescriptions  Medication Sig Dispense Refill  . ALPRAZolam (XANAX) 0.5 MG tablet Take 0.5-1 tablets (0.25-0.5 mg total) by mouth 3 (three) times daily as needed for anxiety. 270 tablet 1  . atorvastatin (LIPITOR) 80 MG tablet Take 1 tablet (80 mg total) by mouth daily. 90 tablet 1  . carisoprodol (SOMA) 350 MG tablet Take 1 tablet (350 mg total) by mouth 3 (three) times daily as needed for muscle spasms. 90 tablet 5  . Cholecalciferol (VITAMIN D) 2000 UNITS CAPS Take 1 capsule by mouth daily.    Marland Kitchen diltiazem (CARDIZEM CD) 180 MG 24 hr capsule Take 1 capsule (180 mg total) by mouth daily. 90 capsule 2  . fenofibrate 160 MG tablet Take 1 tablet (160 mg total) by mouth daily. 90 tablet 1  . glucose blood test strip Use as instructed 100 each 12  . HYDROcodone-acetaminophen (NORCO/VICODIN) 5-325 MG tablet Take 1 tablet by mouth 3 (three) times daily as needed. 90 tablet 0  . Lancets (ONETOUCH ULTRASOFT) lancets Use as instructed 100 each 12  . metoprolol succinate (TOPROL-XL) 50 MG 24 hr tablet Take 1 tablet (50 mg total) by mouth daily. With or immediately following a meal. 90 tablet 2  . Multiple Vitamin (MULTIVITAMIN) tablet Take 1 tablet by mouth daily.      Marland Kitchen NICOTINE TD  Place 1 patch onto the skin daily as needed.    Marland Kitchen omeprazole (PRILOSEC) 20 MG capsule Take 1 capsule (20 mg total) by mouth daily. 90 capsule 2  . rivaroxaban (XARELTO) 20 MG TABS tablet Take 1 tablet (20 mg total) by mouth daily with supper. 30 tablet 8  . tolterodine (DETROL LA) 4 MG 24 hr capsule Take 4 mg by mouth daily.    . vitamin B-12 (CYANOCOBALAMIN) 500 MCG tablet Take 1,000 mcg by mouth daily.      No current facility-administered medications for this visit.      Past Medical History:  Diagnosis Date  . Allergy   . Anxiety   . Anxiety and depression   . Arthritis   . Atrial flutter [facility])    New diagnosis November 10, 2010, rate controlled  . B12 deficiency   . Blurred vision    episode with confusion, evaluated Dr. Erling Cruz  . BPH (benign prostatic hypertrophy)   . CAD (coronary artery disease)    stent, 2001 / nuclear, June, 2010, no ischemia  . Diverticulosis of colon   . Dyslipidemia   . ED (erectile dysfunction)   . Ejection fraction   . GERD (gastroesophageal reflux disease)    pt denies, take omeprazole d/t taking several medications not GERD per pt  . History of colonic polyps   . HTN (hypertension)   . Hyperlipidemia   . Lumbar back pain   . Obesity   . OSA (obstructive sleep apnea)   . PAF (paroxysmal  atrial fibrillation) ([place])   . Peripheral neuropathy (HCC)    lower extremities  . Prediabetes   . Pulmonary nodule   . Scalp lesion    November, 2011  . Sinus bradycardia   . Stroke ([place])    tia  . Testosterone deficiency   . Tobacco use   . Vitamin D deficiency     Past Surgical History:  Procedure Laterality Date  . arm fracture surgery     remotely  . cardica stent x2    . CLAVICLE SURGERY     remotely  . COLONOSCOPY    . TONSILLECTOMY      Social History   Social History  . Marital status: Married    Spouse name: N/A  . Number of children: 0  . Years of education: N/A   Occupational History  . [employer]    Social History Main Topics  . Smoking status: Former Smoker    Packs/day: 0.50    Years: 40.00    Types: Cigarettes    Quit date: 11/19/2014  . Smokeless tobacco: Never Used     Comment: using Nicotine patches  . Alcohol use 3.0 oz/week    5 Standard drinks or equivalent per week     Comment: rare  . Drug use: No  . Sexual activity: Not on file   Other Topics Concern  . Not on file   Social History Narrative   Lives w/ wife, he is the caregiver, she has muscle dysthrophy   No children of his own,   wife's daughter-Gk live w/ them        Family History  Problem Relation Age of Onset  . Stroke Mother   . Alzheimer's disease Mother     Jerilynn Mages, brother   . Heart failure Father   . Stroke Father   . Colon cancer Neg Hx   . Esophageal cancer Neg Hx   . Rectal cancer Neg Hx   . Stomach cancer Neg Hx   . Prostate cancer Neg Hx     ROS: no fevers or chills, productive cough, hemoptysis, dysphasia, odynophagia, melena, hematochezia, dysuria, hematuria, rash, seizure activity, orthopnea, PND, pedal edema, claudication. Remaining systems are negative.  Physical Exam: Well-developed well-nourished in no acute distress.  Skin is warm and dry.  HEENT is normal.  Neck is supple.  Chest is clear to auscultation with normal expansion.  Cardiovascular exam is regular rate and rhythm.  Abdominal exam nontender or distended. No masses palpated. Extremities show no edema. neuro grossly intact  ECG- Sinus bradycardia at a rate of 59. First-degree AV block.  A/P  1 Tobacco abuse-patient is trying to quit at present.  2 hypertension-blood pressure controlled. However his Cardizem is too expensive. Discontinue this medication and increase Toprol to 75 mg daily.  3 hyperlipidemia-continue statin. Discontinue fenofibrate. Check lipids and liver in 4 weeks .  4 paroxysmal atrial flutter-patient remains in sinus rhythm. Continue beta blocker with adjustments as outlined above. Previous  monitor reviewed. There appears to be coarse atrial fibrillation as well. Continue xarelto. Check hemoglobin and renal function.   5 coronary artery disease-continue statin. No aspirin given the for anticoagulation.   Kirk Ruths, MD

## 2016-04-15 ENCOUNTER — Encounter: Payer: Self-pay | Admitting: Cardiology

## 2016-04-15 ENCOUNTER — Ambulatory Visit (INDEPENDENT_AMBULATORY_CARE_PROVIDER_SITE_OTHER): Payer: Medicare Other | Admitting: Cardiology

## 2016-04-15 VITALS — BP 110/71 | HR 58 | Ht 78.0 in | Wt 271.8 lb

## 2016-04-15 DIAGNOSIS — I2510 Atherosclerotic heart disease of native coronary artery without angina pectoris: Secondary | ICD-10-CM

## 2016-04-15 DIAGNOSIS — E7800 Pure hypercholesterolemia, unspecified: Secondary | ICD-10-CM

## 2016-04-15 DIAGNOSIS — I10 Essential (primary) hypertension: Secondary | ICD-10-CM

## 2016-04-15 DIAGNOSIS — I4892 Unspecified atrial flutter: Secondary | ICD-10-CM

## 2016-04-15 MED ORDER — METOPROLOL SUCCINATE ER 25 MG PO TB24: 25.0000 mg | ORAL_TABLET | Freq: Every day | ORAL | 3 refills | Status: DC

## 2016-04-15 NOTE — Patient Instructions (Signed)
Medication Instructions:   STOP DILTIAZEM  STOP FENOFIBRATE  INCREASE METOPROLOL TO 75 MG ONCE DAILY= 1 OF THE 50 MG TABLETS AND 1 OF THE 25 MG TABLETS TOGETHER ONCE DAILY  Labwork:  Your physician recommends that you return for lab work IN DR Richmond 4 WEEKS= DO NOT EAT PRIOR TO LAB WORK  Follow-Up:  Your physician wants you to follow-up in: [place] will receive a reminder letter in the mail two months in advance. If you don't receive a letter, please call our office to schedule the follow-up appointment.   If you need a refill on your cardiac medications before your next appointment, please call your pharmacy.

## 2016-04-22 ENCOUNTER — Other Ambulatory Visit: Payer: Self-pay | Admitting: Internal Medicine

## 2016-04-22 ENCOUNTER — Telehealth: Payer: Self-pay

## 2016-04-22 MED ORDER — CARISOPRODOL 350 MG PO TABS: 350.0000 mg | ORAL_TABLET | Freq: Three times a day (TID) | ORAL | 5 refills | Status: DC | PRN

## 2016-04-22 NOTE — Telephone Encounter (Signed)
Rx faxed to [facility].  

## 2016-04-22 NOTE — Telephone Encounter (Signed)
Pt is requesting refill on Xarelto 20mg . Okay to refill?

## 2016-04-22 NOTE — Telephone Encounter (Signed)
Rx sent 

## 2016-04-22 NOTE — Telephone Encounter (Signed)
Rx printed, awaiting MD signature.  

## 2016-04-22 NOTE — Telephone Encounter (Signed)
Okay for 9 months

## 2016-04-22 NOTE — Telephone Encounter (Signed)
Pt is requesting refill on Soma.  Last OV: 03/02/2016 Last Fill: 10/14/2015 #90 and 5RF  Please advise.

## 2016-04-22 NOTE — Telephone Encounter (Signed)
Ok 90 and 5

## 2016-05-13 ENCOUNTER — Telehealth: Payer: Self-pay | Admitting: *Deleted

## 2016-05-13 ENCOUNTER — Other Ambulatory Visit (INDEPENDENT_AMBULATORY_CARE_PROVIDER_SITE_OTHER): Payer: Medicare Other

## 2016-05-13 DIAGNOSIS — I2510 Atherosclerotic heart disease of native coronary artery without angina pectoris: Secondary | ICD-10-CM

## 2016-05-13 LAB — HEPATIC FUNCTION PANEL
ALK PHOS: 75 U/L (ref 40–115)
ALT: 25 U/L (ref 9–46)
AST: 20 U/L (ref 10–35)
Albumin: 4.2 g/dL (ref 3.6–5.1)
BILIRUBIN DIRECT: 0.2 mg/dL (ref <=0.2)
BILIRUBIN INDIRECT: 0.6 mg/dL (ref 0.2–1.2)
Total Bilirubin: 0.8 mg/dL (ref 0.2–1.2)
Total Protein: 6.7 g/dL (ref 6.1–8.1)

## 2016-05-13 LAB — LIPID PANEL
Cholesterol: 161 mg/dL (ref <200)
HDL: 37 mg/dL — ABNORMAL LOW (ref >40)
LDL CALC: 91 mg/dL (ref <100)
Total CHOL/HDL Ratio: 4.4 Ratio (ref <5.0)
Triglycerides: 166 mg/dL — ABNORMAL HIGH (ref <150)
VLDL: 33 mg/dL — AB (ref <30)

## 2016-05-13 LAB — BASIC METABOLIC PANEL
BUN: 18 mg/dL (ref 7–25)
CHLORIDE: 103 mmol/L (ref 98–110)
CO2: 25 mmol/L (ref 20–31)
Calcium: 9.4 mg/dL (ref 8.6–10.3)
Creat: 0.88 mg/dL (ref 0.70–1.25)
GLUCOSE: 107 mg/dL — AB (ref 65–99)
POTASSIUM: 4.3 mmol/L (ref 3.5–5.3)
Sodium: 138 mmol/L (ref 135–146)

## 2016-05-13 NOTE — Telephone Encounter (Signed)
Appt scheduled 05/15/16 @9 .

## 2016-05-15 ENCOUNTER — Encounter: Payer: Self-pay | Admitting: *Deleted

## 2016-05-15 ENCOUNTER — Ambulatory Visit (INDEPENDENT_AMBULATORY_CARE_PROVIDER_SITE_OTHER): Payer: Medicare Other | Admitting: *Deleted

## 2016-05-15 VITALS — BP 118/62 | HR 61 | Ht 77.5 in | Wt 278.2 lb

## 2016-05-15 DIAGNOSIS — Z0000 Encounter for general adult medical examination without abnormal findings: Secondary | ICD-10-CM

## 2016-05-15 NOTE — Progress Notes (Addendum)
Subjective:   Brendan [name] is a 65 y.o. male who presents for Medicare Annual/Subsequent preventive examination.  Review of Systems:  No ROS.  Medicare Wellness Visit.  Cardiac Risk Factors include: advanced age (>94men, >8 women);dyslipidemia;male gender;hypertension Sleep patterns: Sleeping on air mattress due to family dynamics. Not sleeping well but tries to get at least 6 hrs per night. Home Safety/Smoke Alarms:  Smoke detectors in place. Living environment; residence and Firearm Safety: Lives at home with disabled wife and recently her 2 grown children moved in and are both on disability. No firearms. Feels safe.  Seat Belt Safety/Bike Helmet: Wears seatbelt.   Counseling:   Eye Exam- Wears glasses. Follows annually with Dr.Digby. Dental- Follows every 6 months with Dr.MacDonald.  Male:   CCS-  Last on 07/26/14:  Mild diverticulosis in the sigmoid colon. Grade l internal hemorrhoids. Repeat in 10 years per report. PSA-   Lab Results  Component Value Date   PSA 0.51 12/28/2014   PSA 0.42 06/22/2012   PSA 0.51 10/09/2011        Objective:    Vitals: BP 118/62 (BP Location: Right Arm, Patient Position: Sitting, Cuff Size: Normal)   Pulse 61   Ht 6' 5.5" (1.969 m)   Wt 278 lb 3.2 oz (126.2 kg)   SpO2 96%   BMI 32.57 kg/m   Body mass index is 32.57 kg/m.  Tobacco History  Smoking Status  . Former Smoker  . Packs/day: 0.50  . Years: 40.00  . Types: Cigarettes  . Quit date: 11/19/2014  Smokeless Tobacco  . Never Used    Comment: using Nicotine patches     Counseling given: Not Answered   Past Medical History:  Diagnosis Date  . Allergy   . Anxiety   . Anxiety and depression   . Arthritis   . Atrial flutter [facility])    New diagnosis November 10, 2010, rate controlled  . B12 deficiency   . Blurred vision    episode with confusion, evaluated Dr. Erling Cruz  . BPH (benign prostatic hypertrophy)   . CAD (coronary artery disease)    stent, 2001 / nuclear, June,  2010, no ischemia  . Diverticulosis of colon   . Dyslipidemia   . ED (erectile dysfunction)   . Ejection fraction   . GERD (gastroesophageal reflux disease)    pt denies, take omeprazole d/t taking several medications not GERD per pt  . History of colonic polyps   . HTN (hypertension)   . Hyperlipidemia   . Lumbar back pain   . Obesity   . OSA (obstructive sleep apnea)   . PAF (paroxysmal atrial fibrillation) ([place])   . Peripheral neuropathy (HCC)    lower extremities  . Prediabetes   . Pulmonary nodule   . Scalp lesion    November, 2011  . Sinus bradycardia   . Stroke ([place])    tia  . Testosterone deficiency   . Tobacco use   . Vitamin D deficiency    Past Surgical History:  Procedure Laterality Date  . arm fracture surgery     remotely  . cardica stent x2    . CLAVICLE SURGERY     remotely  . COLONOSCOPY    . TONSILLECTOMY     Family History  Problem Relation Age of Onset  . Stroke Mother   . Alzheimer's disease Mother     Jerilynn Mages, brother   . Heart failure Father   . Stroke Father   . Alzheimer's disease Paternal  Aunt   . Colon cancer Neg Hx   . Esophageal cancer Neg Hx   . Rectal cancer Neg Hx   . Stomach cancer Neg Hx   . Prostate cancer Neg Hx    History  Sexual Activity  . Sexual activity: Not on file    Outpatient Encounter Prescriptions as of 05/15/2016  Medication Sig  . ALPRAZolam (XANAX) 0.5 MG tablet Take 0.5-1 tablets (0.25-0.5 mg total) by mouth 3 (three) times daily as needed for anxiety.  Marland Kitchen atorvastatin (LIPITOR) 80 MG tablet Take 1 tablet (80 mg total) by mouth daily.  . carisoprodol (SOMA) 350 MG tablet Take 1 tablet (350 mg total) by mouth 3 (three) times daily as needed for muscle spasms.  . Cholecalciferol (VITAMIN D) 2000 UNITS CAPS Take 1 capsule by mouth daily.  Marland Kitchen glucose blood test strip Use as instructed  . HYDROcodone-acetaminophen (NORCO/VICODIN) 5-325 MG tablet Take 1 tablet by mouth 3 (three) times daily as needed.  . Lancets  (ONETOUCH ULTRASOFT) lancets Use as instructed  . metoprolol succinate (TOPROL XL) 25 MG 24 hr tablet Take 1 tablet (25 mg total) by mouth daily.  . metoprolol succinate (TOPROL-XL) 50 MG 24 hr tablet Take 1 tablet (50 mg total) by mouth daily. With or immediately following a meal.  . Multiple Vitamin (MULTIVITAMIN) tablet Take 1 tablet by mouth daily.    Marland Kitchen NICOTINE TD Place 1 patch onto the skin daily as needed.  Marland Kitchen omeprazole (PRILOSEC) 20 MG capsule Take 1 capsule (20 mg total) by mouth daily.  . rivaroxaban (XARELTO) 20 MG TABS tablet Take 1 tablet (20 mg total) by mouth daily with supper.  . tolterodine (DETROL LA) 4 MG 24 hr capsule Take 4 mg by mouth daily.  . vitamin B-12 (CYANOCOBALAMIN) 500 MCG tablet Take 1,000 mcg by mouth daily.    No facility-administered encounter medications on file as of 05/15/2016.     Activities of Daily Living In your present state of health, do you have any difficulty performing the following activities: 05/15/2016 07/11/2015  Hearing? Y N  Vision? N N  Difficulty concentrating or making decisions? Y N  Walking or climbing stairs? N N  Dressing or bathing? N N  Doing errands, shopping? N N  Preparing Food and eating ? N -  Using the Toilet? N -  In the past six months, have you accidently leaked urine? N -  Do you have problems with loss of bowel control? N -  Managing your Medications? N -  Managing your Finances? N -  Housekeeping or managing your Housekeeping? N -  Some recent data might be hidden    Patient Care Team: Colon Branch, MD as PCP - General (Internal Medicine) Rigoberto Noel, MD as Consulting Physician (Pulmonary Disease) Franchot Gallo, MD as Consulting Physician (Urology) Ladene Artist, MD as Consulting Physician (Gastroenterology) Carlena Bjornstad, MD as Consulting Physician (Cardiology)   Assessment:    Physical assessment deferred to PCP.  Exercise Activities and Dietary recommendations   Diet :  Prepares majority of  meals at home. Includes lots of vegetables, lean meats, and drinks 4-6 glasses of water per day.  Goals    . Weight (lb) < 250 lb (113.4 kg)      Fall Risk Fall Risk  05/15/2016 03/02/2016 07/11/2015 12/28/2014  Falls in the past year? No No No No   Depression Screen PHQ 2/9 Scores 05/15/2016 03/02/2016 07/11/2015 12/28/2014  PHQ - 2 Score 2 2 0 0  PHQ- 9 Score 4 8 - -    Cognitive Function MMSE - Mini Mental State Exam 05/15/2016  Orientation to time 5  Orientation to Place 5  Registration 3  Attention/ Calculation 5  Recall 3  Language- name 2 objects 2  Language- repeat 1  Language- follow 3 step command 3  Language- read & follow direction 1  Write a sentence 1  Copy design 1  Total score 30        Immunization History  Administered Date(s) Administered  . Influenza Split 06/05/2012, 06/20/2013, 04/10/2014  . Influenza Whole 04/08/2009, 04/22/2010, 02/11/2011  . Influenza, High Dose Seasonal PF 03/02/2016  . Influenza-Unspecified 03/28/2015  . Pneumococcal Conjugate-13 04/27/2014  . Pneumococcal Polysaccharide-23 05/19/2010  . Td 05/19/2010  . Zoster 12/25/2013   Screening Tests Health Maintenance  Topic Date Due  . PNA vac Low Risk Adult (2 of 2 - PPSV23) 04/28/2019 (Originally 05/20/2015)  . TETANUS/TDAP  05/19/2020  . COLONOSCOPY  07/26/2024  . INFLUENZA VACCINE  Completed  . ZOSTAVAX  Completed  . Hepatitis C Screening  Completed      Plan:     Bring a copy of your advance directives to your next office visit. Continue to eat heart healthy diet (full of fruits, vegetables, whole grains, lean protein, water--limit salt, fat, and sugar intake) and increase physical activity as tolerated. Continue to not smoke!!! You are doing great!!!   During the course of the visit the patient was educated and counseled about the following appropriate screening and preventive services:   Vaccines to include Pneumoccal, Influenza, Hepatitis B, Td, Zostavax,  HCV  Cardiovascular Disease  Colorectal cancer screening  Diabetes screening  Prostate Cancer Screening  Glaucoma screening  Nutrition counseling   Smoking cessation counseling  Patient Instructions (the written plan) was given to the patient.    Naaman Plummer [place], [place]  05/15/2016  [name], MD

## 2016-05-15 NOTE — Patient Instructions (Signed)
Bring a copy of your advance directives to your next office visit. Continue to eat heart healthy diet (full of fruits, vegetables, whole grains, lean protein, water--limit salt, fat, and sugar intake) and increase physical activity as tolerated. Continue to not smoke!!! You are doing great!!!

## 2016-05-15 NOTE — Progress Notes (Signed)
Pre visit review using our clinic review tool, if applicable. No additional management support is needed unless otherwise documented below in the visit note. 

## 2016-05-27 ENCOUNTER — Other Ambulatory Visit: Payer: Self-pay | Admitting: Internal Medicine

## 2016-05-27 DIAGNOSIS — H9313 Tinnitus, bilateral: Secondary | ICD-10-CM | POA: Diagnosis not present

## 2016-05-27 DIAGNOSIS — H60393 Other infective otitis externa, bilateral: Secondary | ICD-10-CM | POA: Diagnosis not present

## 2016-05-27 DIAGNOSIS — H903 Sensorineural hearing loss, bilateral: Secondary | ICD-10-CM | POA: Diagnosis not present

## 2016-05-27 NOTE — Telephone Encounter (Signed)
[employer] for [employer]

## 2016-06-10 ENCOUNTER — Encounter: Payer: Self-pay | Admitting: Internal Medicine

## 2016-06-10 DIAGNOSIS — Z79899 Other long term (current) drug therapy: Secondary | ICD-10-CM | POA: Diagnosis not present

## 2016-06-10 DIAGNOSIS — Z79891 Long term (current) use of opiate analgesic: Secondary | ICD-10-CM | POA: Diagnosis not present

## 2016-06-11 ENCOUNTER — Ambulatory Visit (INDEPENDENT_AMBULATORY_CARE_PROVIDER_SITE_OTHER): Payer: Medicare Other | Admitting: Internal Medicine

## 2016-06-11 VITALS — BP 124/76 | HR 59 | Temp 97.5°F | Resp 14 | Ht 78.0 in | Wt 284.2 lb

## 2016-06-11 DIAGNOSIS — Z0000 Encounter for general adult medical examination without abnormal findings: Secondary | ICD-10-CM

## 2016-06-11 DIAGNOSIS — E785 Hyperlipidemia, unspecified: Secondary | ICD-10-CM | POA: Diagnosis not present

## 2016-06-11 DIAGNOSIS — R739 Hyperglycemia, unspecified: Secondary | ICD-10-CM

## 2016-06-11 DIAGNOSIS — F341 Dysthymic disorder: Secondary | ICD-10-CM

## 2016-06-11 DIAGNOSIS — G8929 Other chronic pain: Secondary | ICD-10-CM

## 2016-06-11 DIAGNOSIS — M545 Low back pain, unspecified: Secondary | ICD-10-CM

## 2016-06-11 DIAGNOSIS — I10 Essential (primary) hypertension: Secondary | ICD-10-CM | POA: Diagnosis not present

## 2016-06-11 LAB — HEMOGLOBIN A1C: Hgb A1c MFr Bld: 5.9 % (ref 4.6–6.5)

## 2016-06-11 MED ORDER — BETAMETHASONE DIPROPIONATE AUG 0.05 % EX CREA: TOPICAL_CREAM | Freq: Two times a day (BID) | CUTANEOUS | 0 refills | Status: DC

## 2016-06-11 MED ORDER — CELECOXIB 100 MG PO CAPS: 100.0000 mg | ORAL_CAPSULE | Freq: Two times a day (BID) | ORAL | 1 refills | Status: DC | PRN

## 2016-06-11 MED ORDER — HYDROCODONE-ACETAMINOPHEN 5-325 MG PO TABS: 1.0000 | ORAL_TABLET | Freq: Three times a day (TID) | ORAL | 0 refills | Status: DC | PRN

## 2016-06-11 MED ORDER — OMEPRAZOLE 20 MG PO CPDR: 20.0000 mg | DELAYED_RELEASE_CAPSULE | Freq: Every day | ORAL | 3 refills | Status: DC

## 2016-06-11 NOTE — Patient Instructions (Signed)
GO TO THE LAB : Get the blood work  . He also needs a urine sample for a UDS  GO TO THE FRONT DESK Schedule your next appointment for a  checkup in 4 months    Apply the new cream to the chest twice a day as needed (diprolene)  Stop ibuprofen  Try Celebrex as needed for pain, always take it with food, watch for stomach problems, change in the color of his stools or any bleeding.

## 2016-06-11 NOTE — Progress Notes (Signed)
Pre visit review using our clinic review tool, if applicable. No additional management support is needed unless otherwise documented below in the visit note. 

## 2016-06-11 NOTE — Progress Notes (Signed)
Subjective:    Patient ID: Brendan [name], male    DOB: 10-Aug-1950, 66 y.o.   MRN: [id number]  DOS:  06/11/2016 Type of visit - description : Routine checkup and Medicare wellness Interval history: Had a Medicare wellness with our RN CAD, atrial fibrillation -- cardiology adjusted his medications. Anxiety: Ongoing issue due to family matters. On Xanax Chronic pain: During the wintertime he is not as active so pain is not severe. Also likes to take less controlled substances -->  Based on that he is taking less hydrocodone and using ibuprofen OTC. No apparent side effects. Rash-- was prescribed Nizoral, did not help, make it worse.  Review of Systems  Has chronic congestion of the sinuses, some tinnitus hoarseness, saw ENT recently, was prescribed eardrops. No chest pain or difficulty breathing No nausea, vomiting, diarrhea   Past Medical History:  Diagnosis Date  . Allergy   . Anxiety and depression   . Arthritis   . Atrial flutter [facility])    New diagnosis November 10, 2010, rate controlled  . B12 deficiency   . Blurred vision    episode with confusion, evaluated Dr. Erling Cruz  . BPH (benign prostatic hypertrophy)   . CAD (coronary artery disease)    stent, 2001 / nuclear, June, 2010, no ischemia  . Diverticulosis of colon   . Dyslipidemia   . ED (erectile dysfunction)   . Ejection fraction   . GERD (gastroesophageal reflux disease)    pt denies, take omeprazole d/t taking several medications not GERD per pt  . History of colonic polyps   . HTN (hypertension)   . Lumbar back pain   . Obesity   . OSA (obstructive sleep apnea)   . PAF (paroxysmal atrial fibrillation) ([place])   . Peripheral neuropathy (HCC)    lower extremities  . Prediabetes   . Pulmonary nodule   . Scalp lesion    November, 2011  . Sinus bradycardia   . Stroke ([place])    tia  . Testosterone deficiency   . Tobacco use    hx of  . Vitamin D deficiency     Past Surgical History:  Procedure Laterality Date    . arm fracture surgery     remotely  . cardica stent x2    . CLAVICLE SURGERY     remotely  . TONSILLECTOMY      Social History   Social History  . Marital status: Married    Spouse name: N/A  . Number of children: 0  . Years of education: N/A   Occupational History  . [employer]   Social History Main Topics  . Smoking status: Former Smoker    Packs/day: 0.50    Years: 40.00    Types: Cigarettes    Quit date: 11/19/2014  . Smokeless tobacco: Never Used     Comment: using Nicotine patches  . Alcohol use 3.0 oz/week    5 Standard drinks or equivalent per week     Comment: rare  . Drug use: No  . Sexual activity: Not Currently   Other Topics Concern  . Not on file   Social History Narrative   Lives w/ wife, he is the caregiver, she has muscle dysthrophy   No children of his own, wife's daughter-Gk live w/ them          Allergies as of 06/11/2016      Reactions   Hydrocodone-chlorpheniramine Hives   Tussionex Pennkinetic Er [hydrocod Polst-cpm Polst Er] Hives  Albuterol Palpitations   Qvar [beclomethasone] Palpitations      Medication List       Accurate as of 06/11/16 11:59 PM. Always use your most recent med list.          ALPRAZolam 0.5 MG tablet Commonly known as:  XANAX Take 0.5-1 tablets (0.25-0.5 mg total) by mouth 3 (three) times daily as needed for anxiety.   atorvastatin 80 MG tablet Commonly known as:  LIPITOR TAKE ONE (1) TABLET BY MOUTH EVERY DAY   augmented betamethasone dipropionate 0.05 % cream Commonly known as:  DIPROLENE-AF Apply topically 2 (two) times daily.   carisoprodol 350 MG tablet Commonly known as:  SOMA Take 1 tablet (350 mg total) by mouth 3 (three) times daily as needed for muscle spasms.   celecoxib 100 MG capsule Commonly known as:  CELEBREX Take 1 capsule (100 mg total) by mouth 2 (two) times daily as needed for moderate pain.   ciprofloxacin-fluocinolone PF 0.3-0.025 % Soln Commonly  known as:  OTOVEL Place 0.25 mLs into both ears 2 (two) times daily.   glucose blood test strip Use as instructed   HYDROcodone-acetaminophen 5-325 MG tablet Commonly known as:  NORCO/VICODIN Take 1 tablet by mouth 3 (three) times daily as needed.   metoprolol succinate 50 MG 24 hr tablet Commonly known as:  TOPROL-XL Take 75 mg by mouth daily. Take with or immediately following a meal.   multivitamin tablet Take 1 tablet by mouth daily.   NICOTINE TD Place 1 patch onto the skin daily as needed.   omeprazole 20 MG capsule Commonly known as:  PRILOSEC Take 1 capsule (20 mg total) by mouth daily.   onetouch ultrasoft lancets Use as instructed   rivaroxaban 20 MG Tabs tablet Commonly known as:  XARELTO Take 1 tablet (20 mg total) by mouth daily with supper.   tolterodine 4 MG 24 hr capsule Commonly known as:  DETROL LA Take 4 mg by mouth daily.   Triamcinolone Acetonide Powd by Does not apply route. NYST/TACCR/AQUAP1:2:3   vitamin B-12 500 MCG tablet Commonly known as:  CYANOCOBALAMIN Take 1,000 mcg by mouth daily.   Vitamin D 2000 units Caps Take 1 capsule by mouth daily.          Objective:   Physical Exam  Skin:      BP 124/76 (BP Location: Left Arm, Patient Position: Sitting, Cuff Size: Normal)   Pulse (!) 59   Temp 97.5 F (36.4 C) (Oral)   Resp 14   Ht 6\' 6"  (1.981 m)   Wt 284 lb 4 oz (128.9 kg)   SpO2 97%   BMI 32.85 kg/m  General:   Well developed, well nourished . NAD.  HEENT:  Normocephalic . Face symmetric, atraumatic Lungs:  CTA B Normal respiratory effort, no intercostal retractions, no accessory muscle use. X line Heart: seems regular today,  no murmur.  No pretibial edema bilaterally  abdomen: Soft, nontender Neurologic:  alert & oriented X3.  Speech normal, gait appropriate for age and unassisted Psych--  Cognition and judgment appear intact.  Cooperative with normal attention span and concentration.  Behavior  appropriate. No anxious or depressed appearing.      Assessment & Plan:    Assessment Prediabetes Peripheral neuropathy (no pain, insensitive type) HTN Hyperlipidemia Anxiety depression -- on xanax  Chronic rhinitis- sees Dr Thornell Mule, ENT CV: Dr Stanford Breed --CAD. Stress test okay 05-2015 -- Paroxysmal Atrial fibrillation --TIA --Abdominal bruit: No AAA. Ultrasound 05/2015 OSA - was rx Cpap  GU: BPH, Hypogonadism, ED. Sees urology B12 deficiency H/o pulmonary nodule, CXR 3-16 stable, no further schedule imaging Pain mngmt: has back-shoulder  pain and neuropathy  On Soma and Vicodin. Etodolac DC 2016  per Dr Larose Kells d/t concerns regards anticoagulation. Hydrocodone dose needed to be increased Has seen Dr. Erling Cruz several times since 2010:w/u  included SPE, ACE, TSH, B12 (slightly low with a normal methylmalonic acid) 08-2008 had a lumbar spine MRI with multilevel spondylosis Dr. 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 He seems to be doing well. HTN: See above Hyperlipidemia: Managed by cardiology, fenofibrate was expensive, it was stopped, on Lipitor only, last FLP satisfactory Anxiety depression: Ongoing situation at home bothers him, counseled, again discussed possibly SSRI, needs to see a Medical illustrator. Xanax  RFs sent  Rash: See previous visit, Nizoral fail, actually make it worse. Will prescribe a potent steroid to be use as needed. Back and shoulder pain, pain management: Does not like to use too much hydrocodone, self  started ibuprofen. He is anticoagulated, I would prefer Celebrex to decrease risk of GI bleed, precautions discussed. Cost may be an issue  RTC 4 months

## 2016-06-11 NOTE — Assessment & Plan Note (Signed)
Had a Medicare wellness with our RN today

## 2016-06-12 NOTE — Assessment & Plan Note (Signed)
Diabetes: Diet control, check A1c Atrial fibrillation: Note from cardiology reviewed, Cardizem was expensive, it was stopped, Toprol dose increased. He seems to be doing well. HTN: See above Hyperlipidemia: Managed by cardiology, fenofibrate was expensive, it was stopped, on Lipitor only, last FLP satisfactory Anxiety depression: Ongoing situation at home bothers him, counseled, again discussed possibly SSRI, needs to see a Medical illustrator. Xanax  RFs sent  Rash: See previous visit, Nizoral fail, actually make it worse. Will prescribe a potent steroid to be use as needed. Back and shoulder pain, pain management: Does not like to use too much hydrocodone, self  started ibuprofen. He is anticoagulated, I would prefer Celebrex to decrease risk of GI bleed, precautions discussed. Cost may be an issue  RTC 4 months

## 2016-06-18 ENCOUNTER — Telehealth: Payer: Self-pay

## 2016-06-18 NOTE — Telephone Encounter (Signed)
UDS: 06/10/2016  Negative for Alprazolam Negative for Soma Negative for Hydrocodone   Low risk per PCP 06/18/2016-recheck 6 months

## 2016-07-15 DIAGNOSIS — H903 Sensorineural hearing loss, bilateral: Secondary | ICD-10-CM | POA: Diagnosis not present

## 2016-07-15 DIAGNOSIS — H9313 Tinnitus, bilateral: Secondary | ICD-10-CM | POA: Diagnosis not present

## 2016-09-08 ENCOUNTER — Telehealth: Payer: Self-pay

## 2016-09-08 MED ORDER — ALPRAZOLAM 0.5 MG PO TABS: 0.2500 mg | ORAL_TABLET | Freq: Three times a day (TID) | ORAL | 1 refills | Status: DC | PRN

## 2016-09-08 NOTE — Telephone Encounter (Signed)
Ok 270, 1 RF

## 2016-09-08 NOTE — Telephone Encounter (Signed)
Rx printed, awaiting MD signature.  

## 2016-09-08 NOTE — Telephone Encounter (Signed)
Pt is requesting refill on Alprazolam.  Last OV: 06/11/2016 Last Fill: 03/12/2016 #270 and 1RF UDS: 06/10/2016 Low risk  Please advise.

## 2016-09-08 NOTE — Telephone Encounter (Signed)
Rx faxed to [facility].  

## 2016-09-13 ENCOUNTER — Emergency Department (HOSPITAL_BASED_OUTPATIENT_CLINIC_OR_DEPARTMENT_OTHER)
Admission: EM | Admit: 2016-09-13 | Discharge: 2016-09-13 | Disposition: A | Discharge status: Home / Self Care | Payer: Medicare Other | Attending: Emergency Medicine | Admitting: Emergency Medicine

## 2016-09-13 ENCOUNTER — Encounter (HOSPITAL_BASED_OUTPATIENT_CLINIC_OR_DEPARTMENT_OTHER): Payer: Self-pay | Admitting: *Deleted

## 2016-09-13 DIAGNOSIS — Y999 Unspecified external cause status: Secondary | ICD-10-CM | POA: Insufficient documentation

## 2016-09-13 DIAGNOSIS — Y929 Unspecified place or not applicable: Secondary | ICD-10-CM | POA: Insufficient documentation

## 2016-09-13 DIAGNOSIS — Z79899 Other long term (current) drug therapy: Secondary | ICD-10-CM | POA: Insufficient documentation

## 2016-09-13 DIAGNOSIS — S76311A Strain of muscle, fascia and tendon of the posterior muscle group at thigh level, right thigh, initial encounter: Secondary | ICD-10-CM | POA: Diagnosis not present

## 2016-09-13 DIAGNOSIS — I10 Essential (primary) hypertension: Secondary | ICD-10-CM | POA: Insufficient documentation

## 2016-09-13 DIAGNOSIS — X58XXXA Exposure to other specified factors, initial encounter: Secondary | ICD-10-CM | POA: Diagnosis not present

## 2016-09-13 DIAGNOSIS — F17290 Nicotine dependence, other tobacco product, uncomplicated: Secondary | ICD-10-CM | POA: Insufficient documentation

## 2016-09-13 DIAGNOSIS — M79604 Pain in right leg: Secondary | ICD-10-CM

## 2016-09-13 DIAGNOSIS — I2510 Atherosclerotic heart disease of native coronary artery without angina pectoris: Secondary | ICD-10-CM | POA: Diagnosis not present

## 2016-09-13 DIAGNOSIS — Y939 Activity, unspecified: Secondary | ICD-10-CM | POA: Insufficient documentation

## 2016-09-13 DIAGNOSIS — S8991XA Unspecified injury of right lower leg, initial encounter: Secondary | ICD-10-CM | POA: Diagnosis present

## 2016-09-13 NOTE — ED Notes (Signed)
ED Provider at bedside. 

## 2016-09-13 NOTE — ED Triage Notes (Signed)
Pt reports R buttock/upper thigh pain x1wk with significant worsening yesterday. Denies numbness/tingling, known injury. Pt able to ambulate.

## 2016-09-13 NOTE — ED Notes (Signed)
Pt refused wheelchair, states it starts to feel better once he gets going.

## 2016-09-13 NOTE — ED Provider Notes (Signed)
[facility] DEPT [facility] Provider Note   CSN: [id number] Arrival date & time: 09/13/16  1033     History   Chief Complaint Chief Complaint  Patient presents with  . Leg Pain    HPI Brendan [name] is a 66 y.o. male.  HPI  66 y.o. male presents to the Emergency Department today complaining of right upper posterior leg pain x 1 week. Intermittent. No pain currently. Worsened yesterday while ambulating. Notes no pain at rest. No numbness/tingling. States pain controlled with alleve and tylenol. Denies injury to area. Notes possible lifting injury with bending over and lifting objects. Denies back pain. No saddle anesthesia. No loss of bowel or bladder function. No other symptoms noted.   Past Medical History:  Diagnosis Date  . Allergy   . Anxiety and depression   . Arthritis   . Atrial flutter [facility])    New diagnosis November 10, 2010, rate controlled  . B12 deficiency   . Blurred vision    episode with confusion, evaluated Dr. Erling Cruz  . BPH (benign prostatic hypertrophy)   . CAD (coronary artery disease)    stent, 2001 / nuclear, June, 2010, no ischemia  . Diverticulosis of colon   . Dyslipidemia   . ED (erectile dysfunction)   . Ejection fraction   . GERD (gastroesophageal reflux disease)    pt denies, take omeprazole d/t taking several medications not GERD per pt  . History of colonic polyps   . HTN (hypertension)   . Lumbar back pain   . Obesity   . OSA (obstructive sleep apnea)   . PAF (paroxysmal atrial fibrillation) ([place])   . Peripheral neuropathy (HCC)    lower extremities  . Prediabetes   . Pulmonary nodule   . Scalp lesion    November, 2011  . Sinus bradycardia   . Stroke ([place])    tia  . Testosterone deficiency   . Tobacco use    hx of  . Vitamin D deficiency     Patient Active Problem List   Diagnosis Date Noted  . OSA (obstructive sleep apnea) 10/16/2015  . Bruit 04/24/2015  . PCP NOTES >>>>>> 02/25/2015  . Snoring 08/03/2014  . Special screening  for malignant neoplasms, colon 06/25/2014  . Hx of colonic polyps 06/25/2014  . Sinus bradycardia 06/19/2014  . Annual physical exam 04/28/2014  . Prediabetes 04/28/2014  . Wheezing 03/28/2014  . Ejection fraction   . HX: anticoagulation 02/14/2012  . GERD (gastroesophageal reflux disease) 02/11/2011  . Paroxysmal atrial flutter ([place])   . HTN (hypertension)   . CAD (coronary artery disease)   . Dyslipidemia   . Obesity   . B12 deficiency   . VITAMIN D DEFICIENCY 05/20/2009  . Peripheral neuropathy ([place]) 09/10/2008  . BACK PAIN, LUMBAR--UDS 08/03/2008  . ERECTILE DYSFUNCTION 02/09/2008  . BPH (benign prostatic hyperplasia) 02/09/2008  . COLONIC POLYPS 07/23/2007  . CIGARETTE SMOKER 07/23/2007  . Solitary pulmonary nodule-chest x-ray stable 08-2014, no further schedule imaging 07/23/2007  . DIVERTICULOSIS OF COLON 07/23/2007  . ANXIETY DEPRESSION 07/08/2007    Past Surgical History:  Procedure Laterality Date  . arm fracture surgery     remotely  . cardica stent x2    . CLAVICLE SURGERY     remotely  . TONSILLECTOMY         Home Medications    Prior to Admission medications   Medication Sig Start Date End Date Taking? Authorizing Provider  ALPRAZolam Duanne Moron) 0.5 MG tablet Take 0.5-1 tablets (0.25-0.5  mg total) by mouth 3 (three) times daily as needed for anxiety. 09/08/16  Yes Colon Branch, MD  carisoprodol (SOMA) 350 MG tablet Take 1 tablet (350 mg total) by mouth 3 (three) times daily as needed for muscle spasms. 04/22/16  Yes Colon Branch, MD  Cholecalciferol (VITAMIN D) 2000 UNITS CAPS Take 1 capsule by mouth daily.   Yes Historical Provider, MD  diltiazem (TIAZAC) 180 MG 24 hr capsule Take 180 mg by mouth daily.   Yes Historical Provider, MD  glucose blood test strip Use as instructed 04/16/14  Yes Colon Branch, MD  HYDROcodone-acetaminophen (NORCO/VICODIN) 5-325 MG tablet Take 1 tablet by mouth 3 (three) times daily as needed. 06/11/16  Yes Colon Branch, MD  Lancets [facility]  ULTRASOFT) lancets Use as instructed 04/16/14  Yes Colon Branch, MD  metoprolol succinate (TOPROL-XL) 50 MG 24 hr tablet Take 75 mg by mouth daily. Take with or immediately following a meal.   Yes Historical Provider, MD  Multiple Vitamin (MULTIVITAMIN) tablet Take 1 tablet by mouth daily.     Yes Historical Provider, MD  omeprazole (PRILOSEC) 20 MG capsule Take 1 capsule (20 mg total) by mouth daily. 06/11/16  Yes Colon Branch, MD  oxybutynin (DITROPAN-XL) 10 MG 24 hr tablet Take 10 mg by mouth at bedtime.   Yes Historical Provider, MD  rivaroxaban (XARELTO) 20 MG TABS tablet Take 1 tablet (20 mg total) by mouth daily with supper. 04/22/16  Yes Colon Branch, MD  atorvastatin (LIPITOR) 80 MG tablet TAKE ONE (1) TABLET BY MOUTH EVERY DAY 05/27/16   Lelon Perla, MD  augmented betamethasone dipropionate (DIPROLENE-AF) 0.05 % cream Apply topically 2 (two) times daily. 06/11/16   Colon Branch, MD  celecoxib (CELEBREX) 100 MG capsule Take 1 capsule (100 mg total) by mouth 2 (two) times daily as needed for moderate pain. 06/11/16   Colon Branch, MD  ciprofloxacin-fluocinolone PF (OTOVEL) 0.3-0.025 % SOLN Place 0.25 mLs into both ears 2 (two) times daily.    Historical Provider, MD  NICOTINE TD Place 1 patch onto the skin daily as needed.    Historical Provider, MD  tolterodine (DETROL LA) 4 MG 24 hr capsule Take 4 mg by mouth daily.    Historical Provider, MD  Triamcinolone Acetonide POWD by Does not apply route. NYST/TACCR/AQUAP1:2:3    Historical Provider, MD  vitamin B-12 (CYANOCOBALAMIN) 500 MCG tablet Take 1,000 mcg by mouth daily.     Historical Provider, MD    Family History Family History  Problem Relation Age of Onset  . Stroke Mother   . Alzheimer's disease Mother     Jerilynn Mages, brother   . Heart failure Father   . Stroke Father   . Alzheimer's disease Paternal Aunt   . Colon cancer Neg Hx   . Esophageal cancer Neg Hx   . Rectal cancer Neg Hx   . Stomach cancer Neg Hx   . Prostate cancer Neg Hx      Social History Social History  Substance Use Topics  . Smoking status: Current Some Day Smoker    Packs/day: 0.50    Years: 40.00    Types: Cigars  . Smokeless tobacco: Never Used     Comment: using Nicotine patches  . Alcohol use 3.0 oz/week    5 Standard drinks or equivalent per week     Comment: rare     Allergies   Hydrocodone-chlorpheniramine; Tussionex pennkinetic er [employer] er]; Albuterol; and Qvar [  beclomethasone]   Review of Systems Review of Systems  Constitutional: Negative for fever.  Gastrointestinal: Negative for nausea.  Musculoskeletal: Positive for myalgias.  Skin: Negative for rash and wound.  Neurological: Negative for numbness.   Physical Exam Updated Vital Signs BP 130/90 (BP Location: Right Arm)   Pulse 66   Temp 98.2 F (36.8 C) (Oral)   Resp 18   Ht 6\' 6"  (1.981 m)   Wt 128.8 kg   SpO2 98%   BMI 32.82 kg/m   Physical Exam  Constitutional: He is oriented to person, place, and time. Vital signs are normal. He appears well-developed and well-nourished.  HENT:  Head: Normocephalic.  Right Ear: Hearing normal.  Left Ear: Hearing normal.  Eyes: Conjunctivae and EOM are normal. Pupils are equal, round, and reactive to light.  Cardiovascular: Normal rate and regular rhythm.   Pulmonary/Chest: Effort normal.  Musculoskeletal:       Cervical back: Normal.       Thoracic back: Normal.       Lumbar back: Normal.  TTP below right gluteus maximus along hamstring. No palpable or visible deformities. Distal pulses appreciated. NVI. Ambulates without difficulty.   Neurological: He is alert and oriented to person, place, and time.  Skin: Skin is warm and dry.  Psychiatric: He has a normal mood and affect. His speech is normal and behavior is normal. Thought content normal.  Nursing note and vitals reviewed.    ED Treatments / Results  Labs (all labs ordered are listed, but only abnormal results are displayed) Labs Reviewed  - No data to display  EKG  EKG Interpretation None       Radiology No results found.  Procedures Procedures (including critical care time)  Medications Ordered in ED Medications - No data to display   Initial Impression / Assessment and Plan / ED Course  I have reviewed the triage vital signs and the nursing notes.  Pertinent labs & imaging results that were available during my care of the patient were reviewed by me and considered in my medical decision making (see chart for details).  Final Clinical Impressions(s) / ED Diagnoses     {I have reviewed the relevant previous healthcare records.  {I obtained HPI from historian.   ED Course:  Assessment: Pt is a 66 y.o. male who presents with right posterior leg pain x 1 week. Below gluteus maximus. No numbness/tingling. No direct trauma to area. No pain at rest. Pain with ambulation. On exam, pt in NAD. Nontoxic/nonseptic appearing. VSS. Afebrile. Point tender along hamstring. No palpable or visible deformities. Likely hamstring strain. Concervative measures discussed. Follow up with PCP. Return precautions given. Plan is to [facility]. At time of discharge, Patient is in no acute distress. Vital Signs are stable. Patient is able to ambulate. Patient able to tolerate PO.   Disposition/Plan:  DC Home Additional Verbal discharge instructions given and discussed with patient.  Pt Instructed to f/u with PCP in the next week for evaluation and treatment of symptoms. Return precautions given Pt acknowledges and agrees with plan  Supervising Physician Virgel Manifold, MD Final diagnoses:  Right leg pain  Right hamstring muscle strain, initial encounter    New Prescriptions New Prescriptions   No medications on file     Shary Decamp, PA-C 09/13/16 [place], MD 09/13/16 1336

## 2016-09-13 NOTE — Discharge Instructions (Signed)
Please read and follow all provided instructions.  Your diagnoses today include:  1. Right leg pain   2. Right hamstring muscle strain, initial encounter     Tests performed today include: Vital signs. See below for your results today.   Medications prescribed:  Take as prescribed   Home care instructions:  Follow any educational materials contained in this packet.  Follow-up instructions: Please follow-up with your primary care provider for further evaluation of symptoms and treatment   Return instructions:  Please return to the Emergency Department if you do not get better, if you get worse, or new symptoms OR  - Fever (temperature greater than 101.48F)  - Bleeding that does not stop with holding pressure to the area    -Severe pain (please note that you may be more sore the day after your accident)  - Chest Pain  - Difficulty breathing  - Severe nausea or vomiting  - Inability to tolerate food and liquids  - Passing out  - Skin becoming red around your wounds  - Change in mental status (confusion or lethargy)  - New numbness or weakness    Please return if you have any other emergent concerns.  Additional Information:  Your vital signs today were: BP 130/90 (BP Location: Right Arm)    Pulse 66    Temp 98.2 F (36.8 C) (Oral)    Resp 18    Ht 6\' 6"  (1.981 m)    Wt 128.8 kg    SpO2 98%    BMI 32.82 kg/m  If your blood pressure (BP) was elevated above 135/85 this visit, please have this repeated by your doctor within one month. ---------------

## 2016-09-17 ENCOUNTER — Other Ambulatory Visit: Payer: Self-pay

## 2016-09-17 MED ORDER — METOPROLOL SUCCINATE ER 50 MG PO TB24: ORAL_TABLET | ORAL | 1 refills | Status: DC

## 2016-10-06 ENCOUNTER — Other Ambulatory Visit: Payer: Self-pay

## 2016-10-06 ENCOUNTER — Telehealth: Payer: Self-pay

## 2016-10-06 NOTE — Telephone Encounter (Signed)
PA initiated via Covermymeds; KEY: [id number]. Received PA approval through 10/06/2017.

## 2016-10-07 ENCOUNTER — Encounter: Payer: Self-pay | Admitting: Internal Medicine

## 2016-10-07 ENCOUNTER — Ambulatory Visit (INDEPENDENT_AMBULATORY_CARE_PROVIDER_SITE_OTHER): Payer: Medicare Other | Admitting: Internal Medicine

## 2016-10-07 VITALS — BP 126/74 | HR 67 | Temp 97.7°F | Resp 14 | Ht 78.0 in | Wt 284.1 lb

## 2016-10-07 DIAGNOSIS — I10 Essential (primary) hypertension: Secondary | ICD-10-CM

## 2016-10-07 DIAGNOSIS — I4892 Unspecified atrial flutter: Secondary | ICD-10-CM | POA: Diagnosis not present

## 2016-10-07 DIAGNOSIS — E785 Hyperlipidemia, unspecified: Secondary | ICD-10-CM

## 2016-10-07 DIAGNOSIS — G4733 Obstructive sleep apnea (adult) (pediatric): Secondary | ICD-10-CM | POA: Diagnosis not present

## 2016-10-07 DIAGNOSIS — Z9989 Dependence on other enabling machines and devices: Secondary | ICD-10-CM | POA: Diagnosis not present

## 2016-10-07 NOTE — Progress Notes (Signed)
Pre visit review using our clinic review tool, if applicable. No additional management support is needed unless otherwise documented below in the visit note. 

## 2016-10-07 NOTE — Patient Instructions (Signed)
Go back on atorvastatin  Come back in 3 months for a physical exam, fasting  Take the medications as prescribed,

## 2016-10-07 NOTE — Progress Notes (Signed)
Subjective:    Patient ID: Brendan [name], male    DOB: 02-Oct-1950, 66 y.o.   MRN: [id number]  DOS:  10/07/2016 Type of visit - description : rov Several issues discussed today: The patient was  confused about what medications he takes. Taking Lipitor? Taking diltiazem? Went to the ER, was diagnosed with a sprain, symptoms resolved Patient quit tobacco, gaining weight, he got concerned about it and went back to smoking. Complain of fatigue, he is not using a CPAP, would be interested on retry.   Review of Systems  Continue with stress, taking care off his wife. Unable to get out of the house frequently or exercise because he is a full-time caregiver.  Past Medical History:  Diagnosis Date  . Allergy   . Anxiety and depression   . Arthritis   . Atrial flutter [facility])    New diagnosis November 10, 2010, rate controlled  . B12 deficiency   . Blurred vision    episode with confusion, evaluated Dr. Erling Cruz  . BPH (benign prostatic hypertrophy)   . CAD (coronary artery disease)    stent, 2001 / nuclear, June, 2010, no ischemia  . Diverticulosis of colon   . Dyslipidemia   . ED (erectile dysfunction)   . Ejection fraction   . GERD (gastroesophageal reflux disease)    pt denies, take omeprazole d/t taking several medications not GERD per pt  . History of colonic polyps   . HTN (hypertension)   . Lumbar back pain   . Obesity   . OSA (obstructive sleep apnea)   . PAF (paroxysmal atrial fibrillation) ([place])   . Peripheral neuropathy    lower extremities  . Prediabetes   . Pulmonary nodule   . Scalp lesion    November, 2011  . Sinus bradycardia   . Stroke ([place])    tia  . Testosterone deficiency   . Tobacco use    hx of  . Vitamin D deficiency     Past Surgical History:  Procedure Laterality Date  . arm fracture surgery     remotely  . cardica stent x2    . CLAVICLE SURGERY     remotely  . TONSILLECTOMY      Social History   Social History  . Marital status: Married    Spouse name: N/A  . Number of children: 0  . Years of education: N/A   Occupational History  . [employer]   Social History Main Topics  . Smoking status: Current Some Day Smoker    Packs/day: 0.50    Years: 40.00    Types: Cigars  . Smokeless tobacco: Never Used     Comment: using Nicotine patches  . Alcohol use 3.0 oz/week    5 Standard drinks or equivalent per week     Comment: rare  . Drug use: No  . Sexual activity: Not Currently   Other Topics Concern  . Not on file   Social History Narrative   Lives w/ wife, he is the caregiver, she has muscle dysthrophy   No children of his own, wife's daughter-Gk live w/ them          Allergies as of 10/07/2016      Reactions   Hydrocodone-chlorpheniramine Hives   Tussionex Pennkinetic Er [hydrocod Polst-cpm Polst Er] Hives   Albuterol Palpitations   Qvar [beclomethasone] Palpitations      Medication List       Accurate as of 10/07/16  1:13 PM. Always  use your most recent med list.          ALPRAZolam 0.5 MG tablet Commonly known as:  XANAX Take 0.5-1 tablets (0.25-0.5 mg total) by mouth 3 (three) times daily as needed for anxiety.   atorvastatin 80 MG tablet Commonly known as:  LIPITOR TAKE ONE (1) TABLET BY MOUTH EVERY DAY   carisoprodol 350 MG tablet Commonly known as:  SOMA Take 1 tablet (350 mg total) by mouth 3 (three) times daily as needed for muscle spasms.   glucose blood test strip Use as instructed   HYDROcodone-acetaminophen 5-325 MG tablet Commonly known as:  NORCO/VICODIN Take 1 tablet by mouth 3 (three) times daily as needed.   metoprolol succinate 50 MG 24 hr tablet Commonly known as:  TOPROL-XL Take 1.5 tablets by mouth daily. Take with or immediately following a meal.   multivitamin tablet Take 1 tablet by mouth daily. Calcium, Magnesium, Zinc   NICOTINE TD Place 1 patch onto the skin daily as needed.   omeprazole 20 MG capsule Commonly known as:  PRILOSEC Take 1  capsule (20 mg total) by mouth daily.   onetouch ultrasoft lancets Use as instructed   oxybutynin 10 MG 24 hr tablet Commonly known as:  DITROPAN-XL Take 10 mg by mouth at bedtime.   rivaroxaban 20 MG Tabs tablet Commonly known as:  XARELTO Take 1 tablet (20 mg total) by mouth daily with supper.   vitamin B-12 500 MCG tablet Commonly known as:  CYANOCOBALAMIN Take 1,000 mcg by mouth daily.   Vitamin D 2000 units Caps Take 1 capsule by mouth daily.          Objective:   Physical Exam BP 126/74 (BP Location: Left Arm, Patient Position: Sitting, Cuff Size: Normal)   Pulse 67   Temp 97.7 F (36.5 C) (Oral)   Resp 14   Ht 6\' 6"  (1.981 m)   Wt 284 lb 2 oz (128.9 kg)   SpO2 97%   BMI 32.83 kg/m  General:   Well developed, well nourished . NAD.  HEENT:  Normocephalic . Face symmetric, atraumatic Lungs:  CTA B Normal respiratory effort, no intercostal retractions, no accessory muscle use. Heart: RRR,  no murmur.  No pretibial edema bilaterally  Skin: Not pale. Not jaundice Neurologic:  alert & oriented X3.  Speech normal, gait appropriate for age and unassisted Psych--  Cognition and judgment appear intact.  Cooperative with normal attention span and concentration.  Behavior appropriate. No anxious or depressed appearing.      Assessment & Plan:   Assessment Prediabetes Peripheral neuropathy (no pain, insensitive type) HTN Hyperlipidemia Anxiety depression -- on xanax  Chronic rhinitis- sees Dr Thornell Mule, ENT CV: Dr Stanford Breed --CAD. Stress test okay 05-2015 -- Paroxysmal Atrial fibrillation --TIA --Abdominal bruit: No AAA. Ultrasound 05/2015 OSA - + sleep study 03-2015 ,RX Cpap , not using it as off 10-2016  GU: BPH, Hypogonadism, ED. Sees urology B12 deficiency H/o pulmonary nodule, CXR 3-16 stable, no further schedule imaging Pain mngmt: has back-shoulder  pain and neuropathy  On Soma and Vicodin. Etodolac DC 2016  per Dr Larose Kells d/t concerns regards  anticoagulation. Hydrocodone dose needed to be increased Has seen Dr. Erling Cruz several times since 2010:w/u  included SPE, ACE, TSH, B12 (slightly low with a normal methylmalonic acid) 08-2008 had a lumbar spine MRI with multilevel spondylosis Dr. 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  BP is very good. No change Hyperlipidemia: Apparently not taking Lipitor. rec to take, send a rx. Atrial fibrillation: Anticoagulated OSA: Not using CPAP, c/o/ of fatigue,  willing to try a cpap again, will send a message to pulmonary to see about retry with a different mask. Risk of untreated OSA discussed. Addendum: Discuss with pulmonary, will send a order for a CPAP Tobacco abuse: Back smoking, counseled, rec to call when ready to quit again,  Wellbutrin? Chantix? RTC 3 months, CPX.  Today, I spent more than  25  min with the patient: >50% of the time counseling regards medication compliance, coordinating his care.

## 2016-10-08 ENCOUNTER — Telehealth: Payer: Self-pay

## 2016-10-08 MED ORDER — ATORVASTATIN CALCIUM 80 MG PO TABS: 80.0000 mg | ORAL_TABLET | Freq: Every day | ORAL | 1 refills | Status: DC

## 2016-10-08 NOTE — Assessment & Plan Note (Signed)
Med compliance: Patient is  confused about what he is taking, recommend to go back home, check his med bottles -- see pt message HTN: Currently on metoprolol, and -apparently-  not on diltiazem.  BP is very good. No change Hyperlipidemia: Apparently not taking Lipitor. rec to take, send a rx. Atrial fibrillation: Anticoagulated OSA: Not using CPAP, c/o/ of fatigue,  willing to try a cpap again, will send a message to pulmonary to see about retry with a different mask. Risk of untreated OSA discussed. Addendum: Discuss with pulmonary, will send a order for a CPAP Tobacco abuse: Back smoking, counseled, rec to call when ready to quit again,  Wellbutrin? Chantix? RTC 3 months, CPX.

## 2016-10-08 NOTE — Telephone Encounter (Signed)
Received fax confirmation

## 2016-10-08 NOTE — Telephone Encounter (Signed)
CPAP orders faxed to [facility] at [phone]. Forms sent for scanning.

## 2016-10-23 ENCOUNTER — Other Ambulatory Visit: Payer: Self-pay | Admitting: Internal Medicine

## 2016-11-11 DIAGNOSIS — D485 Neoplasm of uncertain behavior of skin: Secondary | ICD-10-CM | POA: Diagnosis not present

## 2016-11-11 DIAGNOSIS — D2339 Other benign neoplasm of skin of other parts of face: Secondary | ICD-10-CM | POA: Diagnosis not present

## 2016-11-11 DIAGNOSIS — L304 Erythema intertrigo: Secondary | ICD-10-CM | POA: Diagnosis not present

## 2016-11-13 DIAGNOSIS — M19011 Primary osteoarthritis, right shoulder: Secondary | ICD-10-CM | POA: Diagnosis not present

## 2016-11-13 DIAGNOSIS — R52 Pain, unspecified: Secondary | ICD-10-CM | POA: Diagnosis not present

## 2016-11-13 DIAGNOSIS — M19012 Primary osteoarthritis, left shoulder: Secondary | ICD-10-CM | POA: Diagnosis not present

## 2016-11-13 DIAGNOSIS — M24811 Other specific joint derangements of right shoulder, not elsewhere classified: Secondary | ICD-10-CM | POA: Diagnosis not present

## 2016-11-17 DIAGNOSIS — M75111 Incomplete rotator cuff tear or rupture of right shoulder, not specified as traumatic: Secondary | ICD-10-CM | POA: Diagnosis not present

## 2016-11-17 DIAGNOSIS — M25411 Effusion, right shoulder: Secondary | ICD-10-CM | POA: Diagnosis not present

## 2016-11-17 DIAGNOSIS — M7581 Other shoulder lesions, right shoulder: Secondary | ICD-10-CM | POA: Diagnosis not present

## 2016-11-17 DIAGNOSIS — M24811 Other specific joint derangements of right shoulder, not elsewhere classified: Secondary | ICD-10-CM | POA: Diagnosis not present

## 2016-11-17 DIAGNOSIS — M75101 Unspecified rotator cuff tear or rupture of right shoulder, not specified as traumatic: Secondary | ICD-10-CM | POA: Diagnosis not present

## 2016-11-27 DIAGNOSIS — M19011 Primary osteoarthritis, right shoulder: Secondary | ICD-10-CM | POA: Diagnosis not present

## 2016-11-30 DIAGNOSIS — H52223 Regular astigmatism, bilateral: Secondary | ICD-10-CM | POA: Diagnosis not present

## 2016-11-30 DIAGNOSIS — H524 Presbyopia: Secondary | ICD-10-CM | POA: Diagnosis not present

## 2016-11-30 DIAGNOSIS — H43813 Vitreous degeneration, bilateral: Secondary | ICD-10-CM | POA: Diagnosis not present

## 2016-11-30 DIAGNOSIS — H5213 Myopia, bilateral: Secondary | ICD-10-CM | POA: Diagnosis not present

## 2016-11-30 DIAGNOSIS — H35033 Hypertensive retinopathy, bilateral: Secondary | ICD-10-CM | POA: Diagnosis not present

## 2016-11-30 DIAGNOSIS — H2513 Age-related nuclear cataract, bilateral: Secondary | ICD-10-CM | POA: Diagnosis not present

## 2016-12-02 ENCOUNTER — Encounter: Payer: Self-pay | Admitting: Cardiology

## 2016-12-05 ENCOUNTER — Other Ambulatory Visit: Payer: Self-pay | Admitting: Internal Medicine

## 2017-01-05 ENCOUNTER — Other Ambulatory Visit: Payer: Self-pay | Admitting: Internal Medicine

## 2017-01-11 ENCOUNTER — Ambulatory Visit (INDEPENDENT_AMBULATORY_CARE_PROVIDER_SITE_OTHER): Payer: Medicare Other | Admitting: Internal Medicine

## 2017-01-11 ENCOUNTER — Encounter: Payer: Self-pay | Admitting: Internal Medicine

## 2017-01-11 VITALS — BP 126/68 | HR 49 | Temp 98.0°F | Resp 14 | Ht 78.0 in | Wt 286.0 lb

## 2017-01-11 DIAGNOSIS — G4733 Obstructive sleep apnea (adult) (pediatric): Secondary | ICD-10-CM | POA: Diagnosis not present

## 2017-01-11 DIAGNOSIS — E785 Hyperlipidemia, unspecified: Secondary | ICD-10-CM

## 2017-01-11 DIAGNOSIS — R7303 Prediabetes: Secondary | ICD-10-CM | POA: Diagnosis not present

## 2017-01-11 DIAGNOSIS — E538 Deficiency of other specified B group vitamins: Secondary | ICD-10-CM | POA: Diagnosis not present

## 2017-01-11 DIAGNOSIS — I10 Essential (primary) hypertension: Secondary | ICD-10-CM

## 2017-01-11 LAB — CBC WITH DIFFERENTIAL/PLATELET
BASOS PCT: 1.1 % (ref 0.0–3.0)
Basophils Absolute: 0.1 10*3/uL (ref 0.0–0.1)
EOS ABS: 0.2 10*3/uL (ref 0.0–0.7)
EOS PCT: 5 % (ref 0.0–5.0)
HCT: 47.4 % (ref 39.0–52.0)
Hemoglobin: 16.2 g/dL (ref 13.0–17.0)
Lymphocytes Relative: 35.6 % (ref 12.0–46.0)
Lymphs Abs: 1.8 10*3/uL (ref 0.7–4.0)
MCHC: 34.2 g/dL (ref 30.0–36.0)
MCV: 93.4 fl (ref 78.0–100.0)
Monocytes Absolute: 0.4 10*3/uL (ref 0.1–1.0)
Monocytes Relative: 8.2 % (ref 3.0–12.0)
NEUTROS ABS: 2.5 10*3/uL (ref 1.4–7.7)
Neutrophils Relative %: 50.1 % (ref 43.0–77.0)
PLATELETS: 189 10*3/uL (ref 150.0–400.0)
RBC: 5.07 Mil/uL (ref 4.22–5.81)
RDW: 13.2 % (ref 11.5–15.5)
WBC: 4.9 10*3/uL (ref 4.0–10.5)

## 2017-01-11 LAB — BASIC METABOLIC PANEL
BUN: 15 mg/dL (ref 6–23)
CHLORIDE: 104 meq/L (ref 96–112)
CO2: 26 meq/L (ref 19–32)
CREATININE: 0.77 mg/dL (ref 0.40–1.50)
Calcium: 9.1 mg/dL (ref 8.4–10.5)
GFR: 106.82 mL/min (ref >60.00)
Glucose, Bld: 109 mg/dL — ABNORMAL HIGH (ref 70–99)
POTASSIUM: 4.1 meq/L (ref 3.5–5.1)
Sodium: 137 mEq/L (ref 135–145)

## 2017-01-11 LAB — VITAMIN B12: Vitamin B-12: 550 pg/mL (ref 211–911)

## 2017-01-11 MED ORDER — HYDROCODONE-ACETAMINOPHEN 5-325 MG PO TABS: 1.0000 | ORAL_TABLET | Freq: Three times a day (TID) | ORAL | 0 refills | Status: DC | PRN

## 2017-01-11 NOTE — Progress Notes (Signed)
Pre visit review using our clinic review tool, if applicable. No additional management support is needed unless otherwise documented below in the visit note. 

## 2017-01-11 NOTE — Progress Notes (Addendum)
Subjective:    Patient ID: Brendan [name], male    DOB: 1950/07/21, 66 y.o.   MRN: [id number]  DOS:  01/11/2017 Type of visit - description : rov Interval history: Since  last visit he is doing okay, + stress related to his wife's health. He has multiple medical problems, they were discussed. Medication list reviewed, good compliance. Labs reviewed, due for a BMP, CBC and B12   Review of Systems  Reports occasionally palpitations, not exertional, not associated with chest pain, difficulty breathing or presyncope feeling.  Past Medical History:  Diagnosis Date  . Allergy   . Anxiety and depression   . Arthritis   . Atrial flutter [facility])    New diagnosis November 10, 2010, rate controlled  . B12 deficiency   . Blurred vision    episode with confusion, evaluated Dr. Erling Cruz  . BPH (benign prostatic hypertrophy)   . CAD (coronary artery disease)    stent, 2001 / nuclear, June, 2010, no ischemia  . Diverticulosis of colon   . Dyslipidemia   . ED (erectile dysfunction)   . Ejection fraction   . GERD (gastroesophageal reflux disease)    pt denies, take omeprazole d/t taking several medications not GERD per pt  . History of colonic polyps   . HTN (hypertension)   . Lumbar back pain   . Obesity   . OSA (obstructive sleep apnea)   . PAF (paroxysmal atrial fibrillation) ([place])   . Peripheral neuropathy    lower extremities  . Prediabetes   . Pulmonary nodule   . Scalp lesion    November, 2011  . Sinus bradycardia   . Stroke ([place])    tia  . Tobacco use    hx of  . Vitamin D deficiency     Past Surgical History:  Procedure Laterality Date  . arm fracture surgery     remotely  . cardica stent x2    . CLAVICLE SURGERY     remotely  . TONSILLECTOMY      Social History   Social History  . Marital status: Married    Spouse name: N/A  . Number of children: 0  . Years of education: N/A   Occupational History  . [employer]   Social History Main  Topics  . Smoking status: Current Some Day Smoker    Packs/day: 0.50    Years: 40.00    Types: Cigars  . Smokeless tobacco: Never Used     Comment: using Nicotine patches  . Alcohol use 3.0 oz/week    5 Standard drinks or equivalent per week     Comment: rare  . Drug use: No  . Sexual activity: Not Currently   Other Topics Concern  . Not on file   Social History Narrative   Lives w/ wife, he is the caregiver, she has muscle dysthrophy   No children of his own, wife's daughter-Gk live w/ them          Allergies as of 01/11/2017      Reactions   Hydrocodone-chlorpheniramine Hives   Tussionex Pennkinetic Er [hydrocod Polst-cpm Polst Er] Hives   Albuterol Palpitations   Qvar [beclomethasone] Palpitations      Medication List       Accurate as of 01/11/17 11:59 PM. Always use your most recent med list.          ALPRAZolam 0.5 MG tablet Commonly known as:  XANAX Take 0.5-1 tablets (0.25-0.5 mg total) by mouth 3 (  three) times daily as needed for anxiety.   atorvastatin 80 MG tablet Commonly known as:  LIPITOR Take 1 tablet (80 mg total) by mouth daily.   carisoprodol 350 MG tablet Commonly known as:  SOMA Take 1 tablet (350 mg total) by mouth 3 (three) times daily as needed for muscle spasms.   glucose blood test strip Use as instructed   HYDROcodone-acetaminophen 5-325 MG tablet Commonly known as:  NORCO/VICODIN Take 1 tablet by mouth 3 (three) times daily as needed.   metoprolol succinate 50 MG 24 hr tablet Commonly known as:  TOPROL-XL TAKE 1 AND 1/2 TABLETS BY MOUTH DAILY WITH OR IMMEDIATELY FOLLOWING AMEAL   multivitamin tablet Take 1 tablet by mouth daily. Calcium, Magnesium, Zinc   omeprazole 20 MG capsule Commonly known as:  PRILOSEC Take 1 capsule (20 mg total) by mouth daily.   onetouch ultrasoft lancets Use as instructed   oxybutynin 10 MG 24 hr tablet Commonly known as:  DITROPAN-XL Take 10 mg by mouth at bedtime.   rivaroxaban 20 MG Tabs  tablet Commonly known as:  XARELTO Take 1 tablet (20 mg total) by mouth daily with supper.   vitamin B-12 500 MCG tablet Commonly known as:  CYANOCOBALAMIN Take 1,000 mcg by mouth daily.   Vitamin D 2000 units Caps Take 1 capsule by mouth daily.          Objective:   Physical Exam BP 126/68 (BP Location: Left Arm, Patient Position: Sitting, Cuff Size: Normal)   Pulse (!) 49   Temp 98 F (36.7 C) (Oral)   Resp 14   Ht 6\' 6"  (1.981 m)   Wt 286 lb (129.7 kg)   SpO2 96%   BMI 33.05 kg/m  General:   Well developed, well nourished . NAD.  HEENT:  Normocephalic . Face symmetric, atraumatic Lungs:  CTA B Normal respiratory effort, no intercostal retractions, no accessory muscle use. Heart: RRR,  no murmur.  No pretibial edema bilaterally  Skin: Not pale. Not jaundice Neurologic:  alert & oriented X3.  Speech normal, gait appropriate for age and unassisted Psych--  Cognition and judgment appear intact.  Cooperative with normal attention span and concentration.  Behavior appropriate. No anxious or depressed appearing.      Assessment & Plan:   Assessment Prediabetes Peripheral neuropathy (no pain, insensitive type) HTN Hyperlipidemia Anxiety depression -- on xanax  Chronic rhinitis- sees Dr Thornell Mule, ENT CV: Dr Stanford Breed --CAD. Stress test okay 05-2015 -- Paroxysmal Atrial fibrillation --TIA --Abdominal bruit: No AAA. Ultrasound 05/2015 OSA - + sleep study 03-2015 ,RX Cpap , not using it as off 01-2017  GU: BPH, Hypogonadism, ED. Sees urology B12 deficiency H/o pulmonary nodule, CXR 3-16 stable, no further schedule imaging Pain mngmt: has back-shoulder  pain and neuropathy  On Soma and Vicodin. Etodolac DC 2016  per Dr Larose Kells d/t concerns regards anticoagulation. Hydrocodone dose needed to be increased Has seen Dr. Erling Cruz several times since 2010:w/u  included SPE, ACE, TSH, B12 (slightly low with a normal methylmalonic acid) 08-2008 had a lumbar spine MRI with  multilevel spondylosis Dr. 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 HTN: Continue metoprolol, check a BMP and CBC Anxiety depression: On Xanax TID, sxs relatively well control. + Stress due to wife's health. Hyperlipidemia: Controlled, continue Lipitor. OSA: Has not been using the CPAP yet, encouraged to call and get that set up, QOL most likely to improve with consistent use.  Pain mngmt: Back and shoulder pain, chronic. Will refill hydrocodone. RTC, ROV, around 06-2017

## 2017-01-11 NOTE — Patient Instructions (Signed)
GO TO THE LAB : Get the blood work     GO TO THE FRONT DESK Schedule your next appointment for a  routine checkup by January 2019 Also consider Medicare wellness for next year

## 2017-01-12 NOTE — Assessment & Plan Note (Signed)
Prediabetes: Has not been eating healthier lately, counseled. HTN: Continue metoprolol, check a BMP and CBC Anxiety depression: On Xanax TID, sxs relatively well control. + Stress due to wife's health. Hyperlipidemia: Controlled, continue Lipitor. OSA: Has not been using the CPAP yet, encouraged to call and get that set up, QOL most likely to improve with consistent use. Pain mngmt: Back and shoulder pain, chronic. Will refill hydrocodone. RTC, ROV, around 06-2017

## 2017-02-11 ENCOUNTER — Other Ambulatory Visit: Payer: Self-pay | Admitting: Internal Medicine

## 2017-02-11 NOTE — Telephone Encounter (Signed)
Pt is requesting refill on Soma 350mg .   Last OV: 01/11/2017 Last Fill: 04/22/2016 #90 and 5RF   Please advise.

## 2017-02-11 NOTE — Telephone Encounter (Signed)
Rx faxed to [facility].

## 2017-02-11 NOTE — Telephone Encounter (Signed)
Rx printed, awaiting MD signature.  

## 2017-02-11 NOTE — Telephone Encounter (Signed)
Ok 90 and 5 RF

## 2017-02-19 DIAGNOSIS — M19011 Primary osteoarthritis, right shoulder: Secondary | ICD-10-CM | POA: Diagnosis not present

## 2017-02-23 ENCOUNTER — Telehealth: Payer: Self-pay | Admitting: Cardiology

## 2017-02-23 NOTE — Telephone Encounter (Signed)
Patient of Dr. Stanford Breed - returned call to triage He is having same symptoms he had in 2001 when he had stents put in  -- c/o lightheaded, dizzy, cold sweats  -- denies chest pain  -- he was "breathing hard" when he felt bad, like labored breathing He took his BP at home - BP 106/76 and HR 86 then BP 99/71 and HR 75 Checked again 112/72 HR 80 (he is feeling better) He states he felt great this AM Advised symptoms could be related to low BP  He would like to be seen in [facility], no soon available appts Will route to MD/RN - cancellation list?

## 2017-02-23 NOTE — Telephone Encounter (Signed)
Pt c/o BP issue: STAT if pt c/o blurred vision, one-sided weakness or slurred speech  1. What are your last 5 BP readings?106/76  HR 86  2. Are you having any other symptoms (ex. Dizziness, headache, blurred vision, passed out)? Dizzy no chest pain  3. What is your BP issue? high

## 2017-02-23 NOTE — Telephone Encounter (Signed)
[name]  

## 2017-02-23 NOTE — Telephone Encounter (Signed)
LMTCB  Per recalls, patient was due to see MD May 2018

## 2017-02-24 NOTE — Telephone Encounter (Signed)
Scheduled appt 9-19 w/Hao @230pm 

## 2017-02-24 NOTE — Telephone Encounter (Signed)
New Message   pt verbalized that he is returning call for rn  To see if she spoke to [facility]

## 2017-02-25 ENCOUNTER — Encounter: Payer: Self-pay | Admitting: Physician Assistant

## 2017-02-25 ENCOUNTER — Ambulatory Visit (INDEPENDENT_AMBULATORY_CARE_PROVIDER_SITE_OTHER): Payer: Medicare Other | Admitting: Physician Assistant

## 2017-02-25 VITALS — BP 108/64 | HR 66 | Ht 78.0 in | Wt 279.0 lb

## 2017-02-25 DIAGNOSIS — I10 Essential (primary) hypertension: Secondary | ICD-10-CM | POA: Diagnosis not present

## 2017-02-25 DIAGNOSIS — I481 Persistent atrial fibrillation: Secondary | ICD-10-CM | POA: Diagnosis not present

## 2017-02-25 DIAGNOSIS — I4819 Other persistent atrial fibrillation: Secondary | ICD-10-CM

## 2017-02-25 DIAGNOSIS — I25118 Atherosclerotic heart disease of native coronary artery with other forms of angina pectoris: Secondary | ICD-10-CM | POA: Diagnosis not present

## 2017-02-25 DIAGNOSIS — R7303 Prediabetes: Secondary | ICD-10-CM | POA: Diagnosis not present

## 2017-02-25 DIAGNOSIS — G4733 Obstructive sleep apnea (adult) (pediatric): Secondary | ICD-10-CM | POA: Diagnosis not present

## 2017-02-25 DIAGNOSIS — E785 Hyperlipidemia, unspecified: Secondary | ICD-10-CM | POA: Diagnosis not present

## 2017-02-25 NOTE — Patient Instructions (Signed)
Medication Instructions: Your physician recommends that you continue on your current medications as directed. Please refer to the Current Medication list given to you today.  If you need a refill on your cardiac medications before your next appointment, please call your pharmacy.   Procedures/Testing: Your physician has requested that you have an echocardiogram. Echocardiography is a painless test that uses sound waves to create images of your heart. It provides your doctor with information about the size and shape of your heart and how well your heart's chambers and valves are working. This procedure takes approximately one hour. There are no restrictions for this procedure. This will be done at [address]  Your physician has requested that you have a lexiscan myoview. For further information please visit [email or web address]. This will be done at [place], [address]   Follow-Up: Your physician wants you to follow-up in: 3-4 weeks with Almyra Deforest, PA when Dr. Stanford Breed is in the office.  Special Instructions:    Thank you for choosing Heart[facility] at [facility]!!

## 2017-02-25 NOTE — Progress Notes (Signed)
Cardiology Office Note    Date:  02/26/2017   ID:  [name], Brendan 1950/12/26, MRN [id number]  PCP:  Colon Branch, MD  Cardiologist:  Dr. Stanford Breed  Chief Complaint  Patient presents with  . Follow-up    cold swaets, dizziness, for one week, palpitations    History of Present Illness:  Brendan [name] is a 66 y.o. male with PMH of Atrial flutter, CAD, hyperlipidemia, hypertension, PAF, OSA, and prediabetes. He had heart catheterization performed in 2001 because of abnormal nuclear study showed 70% LAD, 80% left circumflex, 80% RCA. At PCI of left circumflex and RCA. EF normal. Echocardiogram obtained in June 2012 showed normal LV function, grade 2 DD, mild right atrial enlargement. Event monitoring generator 2016 showed episode of course atrial fibrillation versus flutter. Study in December 2016 showed normal EF, inferior thinning and in no ischemia. Abdominal ultrasound in December 2016 showed no aneurysm. He was last seen in the cardiology office on 04/15/2016, at which time he was doing well. During the last office visit, metoprolol was increased to 75 mg daily, diltiazem was stopped. Fenofibrate was stopped as well.  In the last few 2-3 months, he has been having significant fatigued and shortness of breath. Today's EKG shows he is back in atrial fibrillation, he does not know how long he has been in atrial fibrillation. He is actually more concerned about coronary artery disease and angina as his current symptom is similar to what he experienced prior to cardiac catheterization in 2001. I recommended a repeat echocardiogram to assess LVEF and left atrial size. I also recommended outpatient Myoview given the similarity of his current symptom was previous angina. If left atrial size is not significantly dilated and Myoview is normal, we will consider outpatient DC cardioversion. Otherwise, he has been compliant with Xarelto.   Past Medical History:  Diagnosis Date  . Allergy   .  Anxiety and depression   . Arthritis   . Atrial flutter [facility])    New diagnosis November 10, 2010, rate controlled  . B12 deficiency   . Blurred vision    episode with confusion, evaluated Dr. Erling Cruz  . BPH (benign prostatic hypertrophy)   . CAD (coronary artery disease)    stent, 2001 / nuclear, June, 2010, no ischemia  . Diverticulosis of colon   . Dyslipidemia   . ED (erectile dysfunction)   . Ejection fraction   . GERD (gastroesophageal reflux disease)    pt denies, take omeprazole d/t taking several medications not GERD per pt  . History of colonic polyps   . HTN (hypertension)   . Lumbar back pain   . Obesity   . OSA (obstructive sleep apnea)   . PAF (paroxysmal atrial fibrillation) ([place])   . Peripheral neuropathy    lower extremities  . Prediabetes   . Pulmonary nodule   . Scalp lesion    November, 2011  . Sinus bradycardia   . Stroke ([place])    tia  . Tobacco use    hx of  . Vitamin D deficiency     Past Surgical History:  Procedure Laterality Date  . arm fracture surgery     remotely  . cardica stent x2    . CLAVICLE SURGERY     remotely  . TONSILLECTOMY      Current Medications: Outpatient Medications Prior to Visit  Medication Sig Dispense Refill  . ALPRAZolam (XANAX) 0.5 MG tablet Take 0.5-1 tablets (0.25-0.5 mg total) by mouth 3 (three)  times daily as needed for anxiety. 270 tablet 1  . atorvastatin (LIPITOR) 80 MG tablet Take 1 tablet (80 mg total) by mouth daily. 90 tablet 1  . carisoprodol (SOMA) 350 MG tablet Take 1 tablet (350 mg total) by mouth 3 (three) times daily as needed for muscle spasms. 90 tablet 5  . Cholecalciferol (VITAMIN D) 2000 UNITS CAPS Take 1 capsule by mouth daily.    Marland Kitchen glucose blood test strip Use as instructed 100 each 12  . HYDROcodone-acetaminophen (NORCO/VICODIN) 5-325 MG tablet Take 1 tablet by mouth 3 (three) times daily as needed. 90 tablet 0  . Lancets (ONETOUCH ULTRASOFT) lancets Use as instructed 100 each 12  . Multiple  Vitamin (MULTIVITAMIN) tablet Take 1 tablet by mouth daily. Calcium, Magnesium, Zinc    . omeprazole (PRILOSEC) 20 MG capsule Take 1 capsule (20 mg total) by mouth daily. 90 capsule 3  . oxybutynin (DITROPAN-XL) 10 MG 24 hr tablet Take 10 mg by mouth at bedtime.    . rivaroxaban (XARELTO) 20 MG TABS tablet Take 1 tablet (20 mg total) by mouth daily with supper. 30 tablet 8  . metoprolol succinate (TOPROL-XL) 50 MG 24 hr tablet TAKE 1 AND 1/2 TABLETS BY MOUTH DAILY WITH OR IMMEDIATELY FOLLOWING AMEAL 45 tablet 1  . vitamin B-12 (CYANOCOBALAMIN) 500 MCG tablet Take 1,000 mcg by mouth daily.      No facility-administered medications prior to visit.      Allergies:   Hydrocodone-chlorpheniramine; Tussionex pennkinetic er [employer] er]; Albuterol; and Qvar [beclomethasone]   Social History   Social History  . Marital status: Married    Spouse name: N/A  . Number of children: 0  . 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 Smoking status: Current Some Day Smoker    Packs/day: 0.50    Years: 40.00    Types: Cigars  . Smokeless tobacco: Never Used     Comment: using Nicotine patches  . Alcohol use 3.0 oz/week    5 Standard drinks or equivalent per week     Comment: rare  . Drug use: No  . Sexual activity: Not Currently   Other Topics Concern  . None   Social History Narrative   Lives w/ wife, he is the caregiver, she has muscle dysthrophy   No children of his own, wife's daughter-Gk live w/ them         Family History:  The patient's family history includes Alzheimer's disease in his mother and paternal aunt; Heart failure in his father; Stroke in his father and mother.   ROS:   Please see the history of present illness.    ROS All other systems reviewed and are negative.   PHYSICAL EXAM:   VS:  BP 108/64   Pulse 66   Ht 6\' 6"  (1.981 m)   Wt 279 lb (126.6 kg)   BMI 32.24 kg/m      GEN: Well nourished, well developed, in no acute distress  HEENT: normal  Neck: no JVD, carotid bruits, or masses Cardiac: Irregularly irregular; no murmurs, rubs, or gallops,no edema  Respiratory:  clear to auscultation bilaterally, normal work of breathing GI: soft, nontender, nondistended, + BS MS: no deformity or atrophy  Skin: warm and dry, no rash Neuro:  Alert and Oriented x 3, Strength and sensation are intact Psych: euthymic mood, full affect  Wt Readings from Last 3 Encounters:  02/25/17 279  lb (126.6 kg)  01/11/17 286 lb (129.7 kg)  10/07/16 284 lb 2 oz (128.9 kg)      Studies/Labs Reviewed:   EKG:  EKG is ordered today.  The ekg ordered today demonstrates Atrial fibrillation, heart rate 66, poor R wave progression in anterior leads.  Recent Labs: 05/13/2016: ALT 25 01/11/2017: BUN 15; Creatinine, Ser 0.77; Hemoglobin 16.2; Platelets 189.0; Potassium 4.1; Sodium 137   Lipid Panel    Component Value Date/Time   CHOL 161 05/13/2016 0848   TRIG 166 (H) 05/13/2016 0848   HDL 37 (L) 05/13/2016 0848   CHOLHDL 4.4 05/13/2016 0848   VLDL 33 (H) 05/13/2016 0848   LDLCALC 91 05/13/2016 0848   LDLDIRECT 107.6 05/09/2010 0908    Additional studies/ records that were reviewed today include:   Echo 12/05/2010 - Left ventricle: The cavity size was normal. Wall thickness was normal. Systolic function was normal. The estimated ejection fraction was in the range of 55% to 65%. Wall motion was normal; there were no regional wall motion abnormalities. Features are consistent with a pseudonormal left ventricular filling pattern, with concomitant abnormal relaxation and increased filling pressure (grade 2 diastolic dysfunction). - Left atrium: The atrium was mildly dilated. - Right atrium: The atrium was mildly dilated.   ASSESSMENT:    1. Coronary artery disease involving native coronary artery of native heart with other form of angina pectoris ([place])   2.  Persistent atrial fibrillation ([place])   3. Essential hypertension   4. Hyperlipidemia, unspecified hyperlipidemia type   5. Prediabetes   6. OSA (obstructive sleep apnea)      PLAN:  In order of problems listed above:  1. CAD: Although he does not have significant chest discomfort, his current symptom is very similar to the previous angina, I recommended outpatient Myoview.  2. Persistent atrial fibrillation: EKG in 2017 shows sinus rhythm, it is unclear how long he has been back in atrial fibrillation. Heart rate is very well controlled and he is compliant with Xarelto. We will obtain echocardiogram to assess EF and left atrial size, as long as left atrial size is not significantly enlarged, we can consider outpatient DC cardioversion  3. Hypertension: Blood pressure stable  4. Hyperlipidemia: On Lipitor 80 mg daily, he will need a fasting lipid panel and LFTs on the next office visit  5. Prediabetes: We'll defer management to primary care provider    Medication Adjustments/Labs and Tests Ordered: Current medicines are reviewed at length with the patient today.  Concerns regarding medicines are outlined above.  Medication changes, Labs and Tests ordered today are listed in the Patient Instructions below. Patient Instructions  Medication Instructions: Your physician recommends that you continue on your current medications as directed. Please refer to the Current Medication list given to you today.  If you need a refill on your cardiac medications before your next appointment, please call your pharmacy.   Procedures/Testing: Your physician has requested that you have an echocardiogram. Echocardiography is a painless test that uses sound waves to create images of your heart. It provides your doctor with information about the size and shape of your heart and how well your heart's chambers and valves are working. This procedure takes approximately one hour. There are no restrictions for  this procedure. This will be done at [address], [address]  Your physician has requested that you have a lexiscan myoview. For further information please visit [email or web address]. This will be done at [place], [address]  Follow-Up: Your physician wants you to follow-up in: 3-4 weeks with Almyra Deforest, PA when Dr. Stanford Breed is in the office.  Special Instructions:    Thank you for choosing Heartcare at [facility], Almyra Deforest, [place]  02/26/2017 1:11 PM    [address], [place], [place]  [address] Phone: [phone]; Fax: [phone]

## 2017-02-26 ENCOUNTER — Encounter: Payer: Self-pay | Admitting: Physician Assistant

## 2017-02-26 ENCOUNTER — Other Ambulatory Visit: Payer: Self-pay | Admitting: Internal Medicine

## 2017-03-05 ENCOUNTER — Other Ambulatory Visit: Payer: Self-pay | Admitting: Internal Medicine

## 2017-03-05 ENCOUNTER — Telehealth (HOSPITAL_COMMUNITY): Payer: Self-pay

## 2017-03-05 NOTE — Telephone Encounter (Signed)
Okay Xarelto for one year Okay Xanax No. 270, no RF

## 2017-03-05 NOTE — Telephone Encounter (Signed)
Rx faxed to [facility].

## 2017-03-05 NOTE — Telephone Encounter (Signed)
Alprazolam Rx printed, awaiting MD signature. Xarelto 20mg  sent to [facility].

## 2017-03-05 NOTE — Telephone Encounter (Signed)
Pt is requesting refill on alprazolam 0.5mg  and Xarelto 20mg .  Last OV: 01/11/2017 Last Fill on alprazolam:  09/08/2016 #270 and 1RF UDS: 06/10/2016 Low risk; recheck 6 months?   Last fill on Xarelto 20mg : 04/22/2016 #30 and 8RF  [place]CR database printed; 12/07/2016- no issues noted  Please advise.

## 2017-03-05 NOTE — Telephone Encounter (Signed)
Encounter complete. 

## 2017-03-09 ENCOUNTER — Ambulatory Visit (HOSPITAL_COMMUNITY): Payer: Medicare Other | Attending: Cardiology

## 2017-03-09 ENCOUNTER — Other Ambulatory Visit: Payer: Self-pay

## 2017-03-09 ENCOUNTER — Telehealth: Payer: Self-pay | Admitting: Cardiology

## 2017-03-09 DIAGNOSIS — E785 Hyperlipidemia, unspecified: Secondary | ICD-10-CM | POA: Insufficient documentation

## 2017-03-09 DIAGNOSIS — I25118 Atherosclerotic heart disease of native coronary artery with other forms of angina pectoris: Secondary | ICD-10-CM | POA: Diagnosis not present

## 2017-03-09 DIAGNOSIS — I10 Essential (primary) hypertension: Secondary | ICD-10-CM | POA: Insufficient documentation

## 2017-03-09 DIAGNOSIS — I481 Persistent atrial fibrillation: Secondary | ICD-10-CM | POA: Diagnosis not present

## 2017-03-09 DIAGNOSIS — I480 Paroxysmal atrial fibrillation: Secondary | ICD-10-CM | POA: Insufficient documentation

## 2017-03-09 DIAGNOSIS — Z8673 Personal history of transient ischemic attack (TIA), and cerebral infarction without residual deficits: Secondary | ICD-10-CM | POA: Insufficient documentation

## 2017-03-09 DIAGNOSIS — I4819 Other persistent atrial fibrillation: Secondary | ICD-10-CM

## 2017-03-09 NOTE — Telephone Encounter (Signed)
°  New Prob  Pt has some questions regarding upcoming myocardial perfusion. Please call.

## 2017-03-09 NOTE — Telephone Encounter (Signed)
Called pt back and reviewed Lexiscan pre-procedure instructions with pt. Nothing to eat or drink at least 2 hours before procedure, no ETOH 24 hours prior, no smoking or caffeine or caffinated beverages 6 hours before testing. He will bring medication list and arrive early for testing. He recalls that he was called and told that he did not have to hold any of his medications.

## 2017-03-10 ENCOUNTER — Ambulatory Visit (HOSPITAL_COMMUNITY)
Admission: RE | Admit: 2017-03-10 | Discharge: 2017-03-10 | Disposition: A | Discharge status: Home / Self Care | Payer: Medicare Other | Source: Ambulatory Visit | Attending: Cardiovascular Disease | Admitting: Cardiovascular Disease

## 2017-03-10 DIAGNOSIS — I25118 Atherosclerotic heart disease of native coronary artery with other forms of angina pectoris: Secondary | ICD-10-CM | POA: Insufficient documentation

## 2017-03-10 DIAGNOSIS — I481 Persistent atrial fibrillation: Secondary | ICD-10-CM | POA: Diagnosis not present

## 2017-03-10 MED ORDER — REGADENOSON 0.4 MG/5ML IV SOLN
0.4000 mg | Freq: Once | INTRAVENOUS | Status: AC
  Administered 2017-03-10: 0.4 mg via INTRAVENOUS

## 2017-03-10 MED ORDER — TECHNETIUM TC 99M TETROFOSMIN IV KIT
31.0000 | PACK | Freq: Once | INTRAVENOUS | Status: AC | PRN
  Administered 2017-03-10: 31 via INTRAVENOUS
  Filled 2017-03-10: qty 31

## 2017-03-11 ENCOUNTER — Ambulatory Visit (HOSPITAL_COMMUNITY)
Admission: RE | Admit: 2017-03-11 | Discharge: 2017-03-11 | Disposition: A | Discharge status: Home / Self Care | Payer: Medicare Other | Source: Ambulatory Visit | Attending: Cardiology | Admitting: Cardiology

## 2017-03-11 LAB — MYOCARDIAL PERFUSION IMAGING
CHL CUP NUCLEAR SRS: 1
CHL CUP RESTING HR STRESS: 53 {beats}/min
LV dias vol: 135 mL (ref 62–150)
LVSYSVOL: 59 mL
Peak HR: 82 {beats}/min
SDS: 1
SSS: 2
TID: 0.92

## 2017-03-11 MED ORDER — TECHNETIUM TC 99M TETROFOSMIN IV KIT
30.6000 | PACK | Freq: Once | INTRAVENOUS | Status: AC | PRN
  Administered 2017-03-11: 30.6 via INTRAVENOUS

## 2017-03-12 ENCOUNTER — Encounter: Payer: Self-pay | Admitting: Physician Assistant

## 2017-03-17 DIAGNOSIS — Z23 Encounter for immunization: Secondary | ICD-10-CM | POA: Diagnosis not present

## 2017-03-18 ENCOUNTER — Ambulatory Visit: Payer: Medicare Other | Admitting: Physician Assistant

## 2017-03-22 ENCOUNTER — Ambulatory Visit (INDEPENDENT_AMBULATORY_CARE_PROVIDER_SITE_OTHER): Payer: Medicare Other | Admitting: Physician Assistant

## 2017-03-22 ENCOUNTER — Encounter: Payer: Self-pay | Admitting: Physician Assistant

## 2017-03-22 VITALS — BP 121/77 | HR 55 | Ht 78.0 in | Wt 279.0 lb

## 2017-03-22 DIAGNOSIS — I10 Essential (primary) hypertension: Secondary | ICD-10-CM | POA: Diagnosis not present

## 2017-03-22 DIAGNOSIS — I2510 Atherosclerotic heart disease of native coronary artery without angina pectoris: Secondary | ICD-10-CM | POA: Diagnosis not present

## 2017-03-22 DIAGNOSIS — R7303 Prediabetes: Secondary | ICD-10-CM | POA: Diagnosis not present

## 2017-03-22 DIAGNOSIS — I4892 Unspecified atrial flutter: Secondary | ICD-10-CM | POA: Diagnosis not present

## 2017-03-22 DIAGNOSIS — E785 Hyperlipidemia, unspecified: Secondary | ICD-10-CM

## 2017-03-22 NOTE — Progress Notes (Signed)
Cardiology Office Note    Date:  03/22/2017   ID:  Brendan [name] 05-24-1951, MRN [id number]  PCP:  Colon Branch, MD  Cardiologist:  Dr. Stanford Breed   Chief Complaint  Patient presents with  . Follow-up    follow up from ECHO and stress test     History of Present Illness:  Brendan [name] is a 66 y.o. male with PMH of atrial flutter, CAD, hyperlipidemia, hypertension, PAF, OSA, and prediabetes. He had heart catheterization performed in 2001 because of abnormal nuclear study showed 70% LAD, 80% left circumflex, 80% RCA. At PCI of left circumflex and RCA. EF normal. Echocardiogram obtained in June 2012 showed normal LV function, grade 2 DD, mild right atrial enlargement. Event monitoring in 2016 showed episode of coarse atrial fibrillation versus flutter. Study in December 2016 showed normal EF, inferior thinning and no ischemia. Abdominal ultrasound in December 2016 showed no aneurysm. His metoprolol was increased to 75 mg daily in Nov 2017, diltiazem was stopped. Fenofibrate was stopped as well.  I last saw the patient in September 2018. Significant fatigue or shortness breath. Patient's EKG showed atrial fibrillation, however he was not aware of how long he was in atrial fibrillation. He was more concerned about coronary artery disease as he is symptom was reminiscent of the previous angina. Recent echocardiogram showed normal LV function and normal left atrial size. Stress test was negative. Today he presented to cardiology office visit for further evaluation. His symptom has completely resolved. On physical exam, his heart rate is very regular, appears to be in sinus rhythm. EKG confirms he has converted back to sinus rhythm.   Past Medical History:  Diagnosis Date  . Allergy   . Anxiety and depression   . Arthritis   . Atrial flutter [facility])    New diagnosis November 10, 2010, rate controlled  . B12 deficiency   . Blurred vision    episode with confusion, evaluated Dr. Erling Cruz  .  BPH (benign prostatic hypertrophy)   . CAD (coronary artery disease)    stent, 2001 / nuclear, June, 2010, no ischemia  . Diverticulosis of colon   . Dyslipidemia   . ED (erectile dysfunction)   . Ejection fraction   . GERD (gastroesophageal reflux disease)    pt denies, take omeprazole d/t taking several medications not GERD per pt  . History of colonic polyps   . HTN (hypertension)   . Lumbar back pain   . Obesity   . OSA (obstructive sleep apnea)   . PAF (paroxysmal atrial fibrillation) ([place])   . Peripheral neuropathy    lower extremities  . Prediabetes   . Pulmonary nodule   . Scalp lesion    November, 2011  . Sinus bradycardia   . Stroke ([place])    tia  . Tobacco use    hx of  . Vitamin D deficiency     Past Surgical History:  Procedure Laterality Date  . arm fracture surgery     remotely  . cardica stent x2    . CLAVICLE SURGERY     remotely  . TONSILLECTOMY      Current Medications: Outpatient Medications Prior to Visit  Medication Sig Dispense Refill  . ALPRAZolam (XANAX) 0.5 MG tablet Take 0.5-1 tablets (0.25-0.5 mg total) by mouth 3 (three) times daily as needed for anxiety. 270 tablet 0  . atorvastatin (LIPITOR) 80 MG tablet Take 1 tablet (80 mg total) by mouth daily. 90 tablet 1  .  carisoprodol (SOMA) 350 MG tablet Take 1 tablet (350 mg total) by mouth 3 (three) times daily as needed for muscle spasms. 90 tablet 5  . Cholecalciferol (VITAMIN D) 2000 UNITS CAPS Take 1 capsule by mouth daily.    Marland Kitchen glucose blood test strip Use as instructed 100 each 12  . HYDROcodone-acetaminophen (NORCO/VICODIN) 5-325 MG tablet Take 1 tablet by mouth 3 (three) times daily as needed. 90 tablet 0  . Lancets (ONETOUCH ULTRASOFT) lancets Use as instructed 100 each 12  . Multiple Vitamin (MULTIVITAMIN) tablet Take 1 tablet by mouth daily. Calcium, Magnesium, Zinc    . omeprazole (PRILOSEC) 20 MG capsule Take 1 capsule (20 mg total) by mouth daily. 90 capsule 3  . oxybutynin  (DITROPAN-XL) 10 MG 24 hr tablet Take 10 mg by mouth at bedtime.    . rivaroxaban (XARELTO) 20 MG TABS tablet Take 1 tablet (20 mg total) by mouth daily with supper. 90 tablet 3  . metoprolol succinate (TOPROL-XL) 50 MG 24 hr tablet Take 1 and 1/2 tablets by mouth daily. Take with or immediately following a meal. (Patient not taking: Reported on 03/22/2017) 45 tablet 5   No facility-administered medications prior to visit.      Allergies:   Hydrocodone-chlorpheniramine; Tussionex pennkinetic er [employer] er]; Albuterol; and Qvar [beclomethasone]   Social History   Social History  . Marital status: Married    Spouse name: N/A  . Number of children: 0  . 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   Social History Main Topics  . Smoking status: Current Some Day Smoker    Packs/day: 0.50    Years: 40.00    Types: Cigars  . Smokeless tobacco: Never Used     Comment: using Nicotine patches  . Alcohol use 3.0 oz/week    5 Standard drinks or equivalent per week     Comment: rare  . Drug use: No  . Sexual activity: Not Currently   Other Topics Concern  . None   Social History Narrative   Lives w/ wife, he is the caregiver, she has muscle dysthrophy   No children of his own, wife's daughter-Gk live w/ them         Family History:  The patient's family history includes Alzheimer's disease in his mother and paternal aunt; Heart failure in his father; Stroke in his father and mother.   ROS:   Please see the history of present illness.    ROS All other systems reviewed and are negative.   PHYSICAL EXAM:   VS:  BP 121/77   Pulse (!) 55   Ht 6\' 6"  (1.981 m)   Wt 279 lb (126.6 kg)   BMI 32.24 kg/m    GEN: Well nourished, well developed, in no acute distress  HEENT: normal  Neck: no JVD, carotid bruits, or masses Cardiac: RRR; no murmurs, rubs, or gallops,no edema  Respiratory:  clear to auscultation bilaterally, normal  work of breathing GI: soft, nontender, nondistended, + BS MS: no deformity or atrophy  Skin: warm and dry, no rash Neuro:  Alert and Oriented x 3, Strength and sensation are intact Psych: euthymic mood, full affect  Wt Readings from Last 3 Encounters:  03/22/17 279 lb (126.6 kg)  03/10/17 279 lb (126.6 kg)  02/25/17 279 lb (126.6 kg)      Studies/Labs Reviewed:   EKG:  EKG is ordered today.  The ekg ordered today demonstrates Normal sinus rhythm with  heart rate 57, otherwise poor wave progression in anterior lead.  Recent Labs: 05/13/2016: ALT 25 01/11/2017: BUN 15; Creatinine, Ser 0.77; Hemoglobin 16.2; Platelets 189.0; Potassium 4.1; Sodium 137   Lipid Panel    Component Value Date/Time   CHOL 161 05/13/2016 0848   TRIG 166 (H) 05/13/2016 0848   HDL 37 (L) 05/13/2016 0848   CHOLHDL 4.4 05/13/2016 0848   VLDL 33 (H) 05/13/2016 0848   LDLCALC 91 05/13/2016 0848   LDLDIRECT 107.6 05/09/2010 0908    Additional studies/ records that were reviewed today include:   Echo 03/09/2017 LV EF: 55% -   60%  Study Conclusions  - Left ventricle: The cavity size was normal. Wall thickness was   increased in a pattern of mild LVH. Systolic function was normal.   The estimated ejection fraction was in the range of 55% to 60%.   Wall motion was normal; there were no regional wall motion   abnormalities. Doppler parameters are consistent with abnormal   left ventricular relaxation (grade 1 diastolic dysfunction).  Impressions:  - Normal LV systolic function; mild diastolic dysfunction; mild   LVH; trace MR and TR.     Myoview 03/11/2017 Study Highlights    Nuclear stress EF: 57%. The left ventricular ejection fraction is normal (55-65%).  The study is normal.  This is a low risk study.      ASSESSMENT:    1. Paroxysmal atrial flutter ([place])   2. Coronary artery disease involving native coronary artery of native heart without angina pectoris   3. Essential  hypertension   4. Hyperlipidemia, unspecified hyperlipidemia type   5. Prediabetes      PLAN:  In order of problems listed above:  1. Paroxysmal atrial fibrillation and atrial flutter: He has been compliant with her Xarelto. Left atrial size normal. He has converted to sinus rhythm. Will continue current rate control medication.  2. CAD: Recent echocardiogram and Myoview normal.  3. Hypertension: Blood pressure well controlled  4. Hyperlipidemia: On Lipitor 80 mg daily. He is due for fasting lipid panel and LFTs  5. Prediabetes: Defer to primary care provider.    Medication Adjustments/Labs and Tests Ordered: Current medicines are reviewed at length with the patient today.  Concerns regarding medicines are outlined above.  Medication changes, Labs and Tests ordered today are listed in the Patient Instructions below. Patient Instructions  Medication Instructions:   No changes  Labwork:   none  Testing/Procedures:  none  Follow-Up:  6 months with Dr. Stanford Breed  If you need a refill on your cardiac medications before your next appointment, please call your pharmacy.      Hilbert Corrigan, [place]  03/22/2017 10:41 PM    [facility] [place], [place],   [address] Phone: [phone]; Fax: [phone]

## 2017-03-22 NOTE — Patient Instructions (Signed)
Medication Instructions:   No changes  Labwork:   none  Testing/Procedures:  none  Follow-Up:  6 months with Dr. Stanford Breed  If you need a refill on your cardiac medications before your next appointment, please call your pharmacy.

## 2017-03-24 ENCOUNTER — Encounter: Payer: Self-pay | Admitting: Internal Medicine

## 2017-03-25 ENCOUNTER — Ambulatory Visit: Payer: Medicare Other | Admitting: Physician Assistant

## 2017-04-02 ENCOUNTER — Encounter: Payer: Self-pay | Admitting: Internal Medicine

## 2017-04-05 ENCOUNTER — Other Ambulatory Visit: Payer: Self-pay | Admitting: Internal Medicine

## 2017-04-26 DIAGNOSIS — N3281 Overactive bladder: Secondary | ICD-10-CM | POA: Diagnosis not present

## 2017-04-26 DIAGNOSIS — N401 Enlarged prostate with lower urinary tract symptoms: Secondary | ICD-10-CM | POA: Diagnosis not present

## 2017-05-13 NOTE — Progress Notes (Deleted)
Subjective:   Brendan [name] is a 66 y.o. male who presents for Medicare Annual/Subsequent preventive examination.  Review of Systems:  Physical assessment deferred to PCP.    Sleep patterns:  Home Safety/Smoke Alarms: Feels safe in home. Smoke alarms in place.   Male:   CCS-  Last 07/26/14:  Recall 10 yrs PSA-  Lab Results  Component Value Date   PSA 0.51 12/28/2014   PSA 0.42 06/22/2012   PSA 0.51 10/09/2011       Objective:    Vitals: There were no vitals taken for this visit.  There is no height or weight on file to calculate BMI.  Advanced Directives 09/13/2016 05/15/2016 04/07/2015 03/11/2015  Does Patient Have a Medical Advance Directive? Yes Yes Yes Yes  Type of Paramedic of [place];Living will Out of facility DNR (pink MOST or yellow form) - [place];Living will  Does patient want to make changes to medical advance directive? - No - Patient declined - No - Patient declined  Copy of [place] in Chart? No - copy requested - - No - copy requested    Tobacco Social History   Tobacco Use  Smoking Status Current Some Day Smoker  . Packs/day: 0.50  . Years: 40.00  . Pack years: 20.00  . Types: Cigars  Smokeless Tobacco Never Used  Tobacco Comment   using Nicotine patches     Ready to quit: Not Answered Counseling given: Not Answered Comment: using Nicotine patches    Past Medical History:  Diagnosis Date  . Allergy   . Anxiety and depression   . Arthritis   . Atrial flutter [facility])    New diagnosis November 10, 2010, rate controlled  . B12 deficiency   . Blurred vision    episode with confusion, evaluated Dr. Erling Cruz  . BPH (benign prostatic hypertrophy)   . CAD (coronary artery disease)    stent, 2001 / nuclear, June, 2010, no ischemia  . Diverticulosis of colon   . Dyslipidemia   . ED (erectile dysfunction)   . Ejection fraction   . GERD (gastroesophageal reflux disease)    pt denies,  take omeprazole d/t taking several medications not GERD per pt  . History of colonic polyps   . HTN (hypertension)   . Lumbar back pain   . Obesity   . OSA (obstructive sleep apnea)   . PAF (paroxysmal atrial fibrillation) ([place])   . Peripheral neuropathy    lower extremities  . Prediabetes   . Pulmonary nodule   . Scalp lesion    November, 2011  . Sinus bradycardia   . Stroke ([place])    tia  . Tobacco use    hx of  . Vitamin D deficiency    Past Surgical History:  Procedure Laterality Date  . arm fracture surgery     remotely  . cardica stent x2    . CLAVICLE SURGERY     remotely  . TONSILLECTOMY     Family History  Problem Relation Age of Onset  . Stroke Mother   . Alzheimer's disease Mother        Jerilynn Mages, brother   . Heart failure Father   . Stroke Father   . Alzheimer's disease Paternal Aunt   . Colon cancer Neg Hx   . Esophageal cancer Neg Hx   . Rectal cancer Neg Hx   . Stomach cancer Neg Hx   . Prostate cancer Neg Hx  Social History   Socioeconomic History  . Marital status: Married    Spouse name: Not on file  . Number of children: 0  . Years of education: Not on file  . Highest education level: Not on file  Social Needs  . Financial resource strain: Not on file  . Food insecurity - worry: Not on file  . Food insecurity - inability: Not on file  . Transportation needs - medical: Not on file  . Transportation needs - non-medical: Not on file  Occupational History  . 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 Smoking status: Current Some Day Smoker    Packs/day: 0.50    Years: 40.00    Pack years: 20.00    Types: Cigars  . Smokeless tobacco: Never Used  . Tobacco comment: using Nicotine patches  Substance and Sexual Activity  . Alcohol use: Yes    Alcohol/week: 3.0 oz    Types: 5 Standard drinks or equivalent per week    Comment: rare  . Drug use: No  . Sexual activity: Not Currently  Other Topics Concern  .  Not on file  Social History Narrative   Lives w/ wife, he is the caregiver, she has muscle dysthrophy   No children of his own, wife's daughter-Gk live w/ them        Outpatient Encounter Medications as of 05/17/2017  Medication Sig  . ALPRAZolam (XANAX) 0.5 MG tablet Take 0.5-1 tablets (0.25-0.5 mg total) by mouth 3 (three) times daily as needed for anxiety.  Marland Kitchen atorvastatin (LIPITOR) 80 MG tablet Take 1 tablet (80 mg total) by mouth daily.  . carisoprodol (SOMA) 350 MG tablet Take 1 tablet (350 mg total) by mouth 3 (three) times daily as needed for muscle spasms.  . Cholecalciferol (VITAMIN D) 2000 UNITS CAPS Take 1 capsule by mouth daily.  Marland Kitchen glucose blood test strip Use as instructed  . HYDROcodone-acetaminophen (NORCO/VICODIN) 5-325 MG tablet Take 1 tablet by mouth 3 (three) times daily as needed.  . Lancets (ONETOUCH ULTRASOFT) lancets Use as instructed  . metoprolol succinate (TOPROL-XL) 25 MG 24 hr tablet Take 1 tablet by mouth daily. Take with 50mg  tablet once a day  . metoprolol succinate (TOPROL-XL) 50 MG 24 hr tablet Take 50 mg by mouth daily. Take with or immediately following a meal. Take with 25mg  tablet once a day  . Multiple Vitamin (MULTIVITAMIN) tablet Take 1 tablet by mouth daily. Calcium, Magnesium, Zinc  . omeprazole (PRILOSEC) 20 MG capsule Take 1 capsule (20 mg total) by mouth daily.  Marland Kitchen oxybutynin (DITROPAN-XL) 10 MG 24 hr tablet Take 10 mg by mouth at bedtime.  . rivaroxaban (XARELTO) 20 MG TABS tablet Take 1 tablet (20 mg total) by mouth daily with supper.   No facility-administered encounter medications on file as of 05/17/2017.     Activities of Daily Living In your present state of health, do you have any difficulty performing the following activities: 05/15/2016  Hearing? Y  Comment Follows with Dr.Kraus--next appt [id number]  Vision? N  Difficulty concentrating or making decisions? Y  Comment From time to time per pt.  Walking or climbing stairs? N    Dressing or bathing? N  Doing errands, shopping? N  Preparing Food and eating ? N  Using the Toilet? N  In the past six months, have you accidently leaked urine? N  Do you have problems with loss of bowel control? N  Managing your Medications? N  Managing  your Finances? N  Housekeeping or managing your Housekeeping? N  Some recent data might be hidden    Patient Care Team: Colon Branch, MD as PCP - General (Internal Medicine) Rigoberto Noel, MD as Consulting Physician (Pulmonary Disease) Franchot Gallo, MD as Consulting Physician (Urology) Ladene Artist, MD as Consulting Physician (Gastroenterology) Lelon Perla, MD as Consulting Physician (Cardiology)   Assessment:    Physical assessment deferred to PCP.  Exercise Activities and Dietary recommendations   Diet (meal preparation, eat out, water intake, caffeinated beverages, dairy products, fruits and vegetables): {Desc; diets:16563} Breakfast: Lunch:  Dinner:      Goals    . Weight (lb) < 250 lb (113.4 kg)      Fall Risk Fall Risk  05/15/2016 03/02/2016 07/11/2015 12/28/2014  Falls in the past year? No No No No    PHQ 2/9 Scores 05/15/2016 03/02/2016 07/11/2015 12/28/2014  PHQ - 2 Score 2 2 0 0  PHQ- 9 Score 4 8 - -    Cognitive Function MMSE - Mini Mental State Exam 05/15/2016  Orientation to time 5  Orientation to Place 5  Registration 3  Attention/ Calculation 5  Recall 3  Language- name 2 objects 2  Language- repeat 1  Language- follow 3 step command 3  Language- read & follow direction 1  Write a sentence 1  Copy design 1  Total score 30        Immunization History  Administered Date(s) Administered  . Influenza Split 06/05/2012, 06/20/2013, 04/10/2014  . Influenza Whole 04/08/2009, 04/22/2010, 02/11/2011  . Influenza, High Dose Seasonal PF 03/02/2016  . Influenza-Unspecified 03/28/2015, 03/17/2017  . Pneumococcal Conjugate-13 04/27/2014  . Pneumococcal Polysaccharide-23 05/19/2010  . Td  05/19/2010  . Zoster 12/25/2013   Screening Tests Health Maintenance  Topic Date Due  . PNA vac Low Risk Adult (2 of 2 - PPSV23) 04/28/2019 (Originally 05/20/2015)  . TETANUS/TDAP  05/19/2020  . COLONOSCOPY  07/26/2024  . INFLUENZA VACCINE  Completed  . Hepatitis C Screening  Completed       Plan:   ***  I have personally reviewed and noted the following in the patient's chart:   . Medical and social history . Use of alcohol, tobacco or illicit drugs  . Current medications and supplements . Functional ability and status . Nutritional status . Physical activity . Advanced directives . List of other physicians . Hospitalizations, surgeries, and ER visits in previous 12 months . Vitals . Screenings to include cognitive, depression, and falls . Referrals and appointments  In addition, I have reviewed and discussed with patient certain preventive protocols, quality metrics, and best practice recommendations. A written personalized care plan for preventive services as well as general preventive health recommendations were provided to patient.     Shela Nevin, [place]  05/13/2017

## 2017-05-17 ENCOUNTER — Ambulatory Visit: Payer: Medicare Other | Admitting: *Deleted

## 2017-05-24 NOTE — Progress Notes (Addendum)
Subjective:   Brendan [name] is a 66 y.o. male who presents for Medicare Annual/Subsequent preventive examination.  Review of Systems:  No ROS.  Medicare Wellness Visit. Additional risk factors are reflected in the social history.  Cardiac Risk Factors include: advanced age (>35men, >72 women);dyslipidemia;hypertension;male gender;sedentary lifestyle;smoking/ tobacco exposure;obesity (BMI >30kg/m2) Sleep patterns: Sleeps about 5-6 hrs per night.  Home Safety/Smoke Alarms: Feels safe in home. Smoke alarms in place. 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 1 story home.  Male:   CCS- last 07/26/14: recall 10 yrs  PSA-  Lab Results  Component Value Date   PSA 0.51 12/28/2014   PSA 0.42 06/22/2012   PSA 0.51 10/09/2011       Objective:    Vitals: BP (!) 112/58 (BP Location: Left Arm, Patient Position: Sitting, Cuff Size: Normal)   Pulse 61   Wt 285 lb (129.3 kg)   SpO2 96%   BMI 32.94 kg/m   Body mass index is 32.94 kg/m.  Advanced Directives 05/25/2017 09/13/2016 05/15/2016 04/07/2015 03/11/2015  Does Patient Have a Medical Advance Directive? Yes Yes Yes Yes Yes  Type of Paramedic of [place];Living will [place];Living will Out of facility DNR (pink MOST or yellow form) - [place];Living will  Does patient want to make changes to medical advance directive? - - No - Patient declined - No - Patient declined  Copy of [place] in Chart? Yes No - copy requested - - No - copy requested    Tobacco Social History   Tobacco Use  Smoking Status Current Every Day Smoker  . Packs/day: 0.50  . Years: 40.00  . Pack years: 20.00  . Types: Cigars  Smokeless Tobacco Never Used     Ready to quit: Not Answered Counseling given: Not Answered   Clinical Intake: Pain : No/denies pain   Past Medical History:  Diagnosis Date  . Allergy   . Anxiety and depression   . Arthritis   . Atrial  flutter [facility])    New diagnosis November 10, 2010, rate controlled  . B12 deficiency   . Blurred vision    episode with confusion, evaluated Dr. Erling Cruz  . BPH (benign prostatic hypertrophy)   . CAD (coronary artery disease)    stent, 2001 / nuclear, June, 2010, no ischemia  . Diverticulosis of colon   . Dyslipidemia   . ED (erectile dysfunction)   . Ejection fraction   . GERD (gastroesophageal reflux disease)    pt denies, take omeprazole d/t taking several medications not GERD per pt  . History of colonic polyps   . HTN (hypertension)   . Lumbar back pain   . Obesity   . OSA (obstructive sleep apnea)   . PAF (paroxysmal atrial fibrillation) ([place])   . Peripheral neuropathy    lower extremities  . Prediabetes   . Pulmonary nodule   . Scalp lesion    November, 2011  . Sinus bradycardia   . Stroke ([place])    tia  . Tobacco use    hx of  . Vitamin D deficiency    Past Surgical History:  Procedure Laterality Date  . arm fracture surgery     remotely  . cardica stent x2    . CLAVICLE SURGERY     remotely  . TONSILLECTOMY     Family History  Problem Relation Age of Onset  . Stroke Mother   . Alzheimer's disease Mother  M, brother   . Heart failure Father   . Stroke Father   . Alzheimer's disease Paternal Aunt   . Colon cancer Neg Hx   . Esophageal cancer Neg Hx   . Rectal cancer Neg Hx   . Stomach cancer Neg Hx   . Prostate cancer Neg Hx    Social History   Socioeconomic History  . Marital status: Married    Spouse name: None  . Number of children: 0  . Years of education: None  . Highest education level: None  Social Needs  . Financial resource strain: None  . Food insecurity - worry: None  . Food insecurity - inability: None  . Transportation needs - medical: None  . Transportation needs - non-medical: None  Occupational History  . 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 Smoking status: Current Every Day Smoker     Packs/day: 0.50    Years: 40.00    Pack years: 20.00    Types: Cigars  . Smokeless tobacco: Never Used  Substance and Sexual Activity  . Alcohol use: Yes    Alcohol/week: 3.0 oz    Types: 5 Standard drinks or equivalent per week    Comment: rare  . Drug use: No  . Sexual activity: Not Currently  Other Topics Concern  . None  Social History Narrative   Lives w/ wife, he is the caregiver, she has muscle dysthrophy   No children of his own, wife's daughter-Gk live w/ them        Outpatient Encounter Medications as of 05/25/2017  Medication Sig  . ALPRAZolam (XANAX) 0.5 MG tablet Take 0.5-1 tablets (0.25-0.5 mg total) by mouth 3 (three) times daily as needed for anxiety.  Marland Kitchen atorvastatin (LIPITOR) 80 MG tablet Take 1 tablet (80 mg total) by mouth daily.  . carisoprodol (SOMA) 350 MG tablet Take 1 tablet (350 mg total) by mouth 3 (three) times daily as needed for muscle spasms.  . Cholecalciferol (VITAMIN D) 2000 UNITS CAPS Take 1 capsule by mouth daily.  Marland Kitchen HYDROcodone-acetaminophen (NORCO/VICODIN) 5-325 MG tablet Take 1 tablet by mouth 3 (three) times daily as needed.  . metoprolol succinate (TOPROL-XL) 50 MG 24 hr tablet Take 75 mg by mouth daily. Take with or immediately following a meal. Take with 25mg  tablet once a day   . Multiple Vitamin (MULTIVITAMIN) tablet Take 1 tablet by mouth daily. Calcium, Magnesium, Zinc  . omeprazole (PRILOSEC) 20 MG capsule Take 1 capsule (20 mg total) by mouth daily.  Marland Kitchen oxybutynin (DITROPAN-XL) 10 MG 24 hr tablet Take 10 mg by mouth at bedtime.  . rivaroxaban (XARELTO) 20 MG TABS tablet Take 1 tablet (20 mg total) by mouth daily with supper.  Marland Kitchen glucose blood test strip Use as instructed (Patient not taking: Reported on 05/25/2017)  . Lancets (ONETOUCH ULTRASOFT) lancets Use as instructed (Patient not taking: Reported on 05/25/2017)  . [DISCONTINUED] metoprolol succinate (TOPROL-XL) 25 MG 24 hr tablet Take 1 tablet by mouth daily. Take with 50mg  tablet  once a day   No facility-administered encounter medications on file as of 05/25/2017.     Activities of Daily Living In your present state of health, do you have any difficulty performing the following activities: 05/25/2017  Hearing? N  Vision? N  Comment wearing glasses. [place] yearly.  Difficulty concentrating or making decisions? N  Walking or climbing stairs? N  Dressing or bathing? N  Doing errands, shopping? N  Preparing Food and eating ?  N  Using the Toilet? N  In the past six months, have you accidently leaked urine? N  Do you have problems with loss of bowel control? N  Managing your Medications? N  Managing your Finances? N  Housekeeping or managing your Housekeeping? N  Some recent data might be hidden    Patient Care Team: Colon Branch, MD as PCP - General (Internal Medicine) Rigoberto Noel, MD as Consulting Physician (Pulmonary Disease) Franchot Gallo, MD as Consulting Physician (Urology) Ladene Artist, MD as Consulting Physician (Gastroenterology) Lelon Perla, MD as Consulting Physician (Cardiology)   Assessment:   This is a routine wellness examination for Brendan [name]. Physical assessment deferred to PCP.  Exercise Activities and Dietary recommendations Current Exercise Habits: The patient does not participate in regular exercise at present, Exercise limited by: None identified   Diet (meal preparation, eat out, water intake, caffeinated beverages, dairy products, fruits and vegetables): in general, an "unhealthy" diet   Goals    . Weight (lb) < 250 lb (113.4 kg)       Fall Risk Fall Risk  05/25/2017 05/15/2016 03/02/2016 07/11/2015 12/28/2014  Falls in the past year? No No No No No   Depression Screen PHQ 2/9 Scores 05/25/2017 05/15/2016 03/02/2016 07/11/2015  PHQ - 2 Score 1 2 2  0  PHQ- 9 Score - 4 8 -    Cognitive Function MMSE - Mini Mental State Exam 05/25/2017 05/15/2016  Orientation to time 5 5  Orientation to Place 5 5  Registration 3 3    Attention/ Calculation 5 5  Recall 3 3  Language- name 2 objects 2 2  Language- repeat 1 1  Language- follow 3 step command 3 3  Language- read & follow direction 1 1  Write a sentence 1 1  Copy design 1 1  Total score 30 30        Immunization History  Administered Date(s) Administered  . Influenza Split 06/05/2012, 06/20/2013, 04/10/2014  . Influenza Whole 04/08/2009, 04/22/2010, 02/11/2011  . Influenza, High Dose Seasonal PF 03/02/2016  . Influenza-Unspecified 03/28/2015, 03/17/2017  . Pneumococcal Conjugate-13 04/27/2014  . Pneumococcal Polysaccharide-23 05/19/2010  . Td 05/19/2010  . Zoster 12/25/2013    Screening Tests Health Maintenance  Topic Date Due  . PNA vac Low Risk Adult (2 of 2 - PPSV23) 04/28/2019 (Originally 05/20/2015)  . TETANUS/TDAP  05/19/2020  . COLONOSCOPY  07/26/2024  . INFLUENZA VACCINE  Completed  . Hepatitis C Screening  Completed      Plan:   Follow up with Dr.Paz 06/14/17.   Continue to eat heart healthy diet (full of fruits, vegetables, whole grains, lean protein, water--limit salt, fat, and sugar intake) and increase physical activity as tolerated.  Continue doing brain stimulating activities (puzzles, reading, adult coloring books, staying active) to keep memory sharp.   Bring a copy of your living will and/or healthcare power of attorney to your next office visit.  I have personally reviewed and noted the following in the patient's chart:   . Medical and social history . Use of alcohol, tobacco or illicit drugs  . Current medications and supplements . Functional ability and status . Nutritional status . Physical activity . Advanced directives . List of other physicians . Hospitalizations, surgeries, and ER visits in previous 12 months . Vitals . Screenings to include cognitive, depression, and falls . Referrals and appointments  In addition, I have reviewed and discussed with patient certain preventive protocols, quality  metrics, and best practice recommendations. A written  personalized care plan for preventive services as well as general preventive health recommendations were provided to patient.     Naaman Plummer [place], [place]  05/25/2017  [name], MD

## 2017-05-25 ENCOUNTER — Encounter: Payer: Self-pay | Admitting: *Deleted

## 2017-05-25 ENCOUNTER — Ambulatory Visit (INDEPENDENT_AMBULATORY_CARE_PROVIDER_SITE_OTHER): Payer: Medicare Other | Admitting: *Deleted

## 2017-05-25 VITALS — BP 112/58 | HR 61 | Wt 285.0 lb

## 2017-05-25 DIAGNOSIS — Z0000 Encounter for general adult medical examination without abnormal findings: Secondary | ICD-10-CM

## 2017-05-25 NOTE — Patient Instructions (Signed)
Follow up with Dr.Paz 06/14/17.   Continue to eat heart healthy diet (full of fruits, vegetables, whole grains, lean protein, water--limit salt, fat, and sugar intake) and increase physical activity as tolerated.  Continue doing brain stimulating activities (puzzles, reading, adult coloring books, staying active) to keep memory sharp.   Bring a copy of your living will and/or healthcare power of attorney to your next office visit.    Brendan [name] , Thank you for taking time to come for your Medicare Wellness Visit. I appreciate your ongoing commitment to your health goals. Please review the following plan we discussed and let me know if I can assist you in the future.   These are the goals we discussed: Goals    . Weight (lb) < 250 lb (113.4 kg)       This is a list of the screening recommended for you and due dates:  Health Maintenance  Topic Date Due  . Pneumonia vaccines (2 of 2 - PPSV23) 04/28/2019*  . Tetanus Vaccine  05/19/2020  . Colon Cancer Screening  07/26/2024  . Flu Shot  Completed  .  Hepatitis C: One time screening is recommended by Center for Disease Control  (CDC) for  adults born from [birth year] through 1967.   Completed  *Topic was postponed. The date shown is not the original due date.    Health Maintenance, Male A healthy lifestyle and preventive care is important for your health and wellness. Ask your health care provider about what schedule of regular examinations is right for you. What should I know about weight and diet? Eat a Healthy Diet  Eat plenty of vegetables, fruits, whole grains, low-fat dairy products, and lean protein.  Do not eat a lot of foods high in solid fats, added sugars, or salt.  Maintain a Healthy Weight Regular exercise can help you achieve or maintain a healthy weight. You should:  Do at least 150 minutes of exercise each week. The exercise should increase your heart rate and make you sweat (moderate-intensity exercise).  Do  strength-training exercises at least twice a week.  Watch Your Levels of Cholesterol and Blood Lipids  Have your blood tested for lipids and cholesterol every 5 years starting at 68 years of age. If you are at high risk for heart disease, you should start having your blood tested when you are 68 years old. You may need to have your cholesterol levels checked more often if: ? Your lipid or cholesterol levels are high. ? You are older than 68 years of age. ? You are at high risk for heart disease.  What should I know about cancer screening? Many types of cancers can be detected early and may often be prevented. Lung Cancer  You should be screened every year for lung cancer if: ? You are a current smoker who has smoked for at least 30 years. ? You are a former smoker who has quit within the past 15 years.  Talk to your health care provider about your screening options, when you should start screening, and how often you should be screened.  Colorectal Cancer  Routine colorectal cancer screening usually begins at 68 years of age and should be repeated every 5-10 years until you are 68 years old. You may need to be screened more often if early forms of precancerous polyps or small growths are found. Your health care provider may recommend screening at an earlier age if you have risk factors for colon cancer.  Your health care  provider may recommend using home test kits to check for hidden blood in the stool.  A small camera at the end of a tube can be used to examine your colon (sigmoidoscopy or colonoscopy). This checks for the earliest forms of colorectal cancer.  Prostate and Testicular Cancer  Depending on your age and overall health, your health care provider may do certain tests to screen for prostate and testicular cancer.  Talk to your health care provider about any symptoms or concerns you have about testicular or prostate cancer.  Skin Cancer  Check your skin from head to toe  regularly.  Tell your health care provider about any new moles or changes in moles, especially if: ? There is a change in a mole's size, shape, or color. ? You have a mole that is larger than a pencil eraser.  Always use sunscreen. Apply sunscreen liberally and repeat throughout the day.  Protect yourself by wearing long sleeves, pants, a wide-brimmed hat, and sunglasses when outside.  What should I know about heart disease, diabetes, and high blood pressure?  If you are 60-68 years of age, have your blood pressure checked every 3-5 years. If you are 92 years of age or older, have your blood pressure checked every year. You should have your blood pressure measured twice-once when you are at a hospital or clinic, and once when you are not at a hospital or clinic. Record the average of the two measurements. To check your blood pressure when you are not at a hospital or clinic, you can use: ? An automated blood pressure machine at a pharmacy. ? A home blood pressure monitor.  Talk to your health care provider about your target blood pressure.  If you are between 17-60 years old, ask your health care provider if you should take aspirin to prevent heart disease.  Have regular diabetes screenings by checking your fasting blood sugar level. ? If you are at a normal weight and have a low risk for diabetes, have this test once every three years after the age of 19. ? If you are overweight and have a high risk for diabetes, consider being tested at a younger age or more often.  A one-time screening for abdominal aortic aneurysm (AAA) by ultrasound is recommended for men aged 51-75 years who are current or former smokers. What should I know about preventing infection? Hepatitis B If you have a higher risk for hepatitis B, you should be screened for this virus. Talk with your health care provider to find out if you are at risk for hepatitis B infection. Hepatitis C Blood testing is recommended  for:  Everyone born from [birth year] through 1967.  Anyone with known risk factors for hepatitis C.  Sexually Transmitted Diseases (STDs)  You should be screened each year for STDs including gonorrhea and chlamydia if: ? You are sexually active and are younger than 68 years of age. ? You are older than 68 years of age and your health care provider tells you that you are at risk for this type of infection. ? Your sexual activity has changed since you were last screened and you are at an increased risk for chlamydia or gonorrhea. Ask your health care provider if you are at risk.  Talk with your health care provider about whether you are at high risk of being infected with HIV. Your health care provider may recommend a prescription medicine to help prevent HIV infection.  What else can I do?  Schedule regular  health, dental, and eye exams.  Stay current with your vaccines (immunizations).  Do not use any tobacco products, such as cigarettes, chewing tobacco, and e-cigarettes. If you need help quitting, ask your health care provider.  Limit alcohol intake to no more than 2 drinks per day. One drink equals 12 ounces of beer, 5 ounces of wine, or 1 ounces of hard liquor.  Do not use street drugs.  Do not share needles.  Ask your health care provider for help if you need support or information about quitting drugs.  Tell your health care provider if you often feel depressed.  Tell your health care provider if you have ever been abused or do not feel safe at home. This information is not intended to replace advice given to you by your health care provider. Make sure you discuss any questions you have with your health care provider. Document Released: 11/21/2007 Document Revised: 01/22/2016 Document Reviewed: 02/26/2015 Elsevier Interactive Patient Education  [employer].

## 2017-05-28 DIAGNOSIS — M19011 Primary osteoarthritis, right shoulder: Secondary | ICD-10-CM | POA: Diagnosis not present

## 2017-05-28 DIAGNOSIS — M19012 Primary osteoarthritis, left shoulder: Secondary | ICD-10-CM | POA: Diagnosis not present

## 2017-06-09 ENCOUNTER — Other Ambulatory Visit: Payer: Self-pay | Admitting: Internal Medicine

## 2017-06-09 NOTE — Telephone Encounter (Signed)
sent 

## 2017-06-09 NOTE — Telephone Encounter (Signed)
Pt is requesting refill on alprazolam 0.5mg   Last OV: 05/25/2017 w/ [facility] for AWV Last Fill: 03/05/2017 #270 and 0RF UDS: 06/10/2016 Low risk  NCCR printed; no issues noted  Please advise.

## 2017-06-14 ENCOUNTER — Ambulatory Visit: Payer: Medicare Other | Admitting: Internal Medicine

## 2017-07-12 ENCOUNTER — Encounter: Payer: Self-pay | Admitting: Internal Medicine

## 2017-07-12 ENCOUNTER — Ambulatory Visit (INDEPENDENT_AMBULATORY_CARE_PROVIDER_SITE_OTHER): Payer: Medicare Other | Admitting: Internal Medicine

## 2017-07-12 VITALS — BP 124/66 | HR 56 | Temp 97.5°F | Resp 14 | Ht 78.0 in | Wt 289.1 lb

## 2017-07-12 DIAGNOSIS — F341 Dysthymic disorder: Secondary | ICD-10-CM

## 2017-07-12 DIAGNOSIS — I10 Essential (primary) hypertension: Secondary | ICD-10-CM | POA: Diagnosis not present

## 2017-07-12 DIAGNOSIS — E785 Hyperlipidemia, unspecified: Secondary | ICD-10-CM | POA: Diagnosis not present

## 2017-07-12 DIAGNOSIS — R739 Hyperglycemia, unspecified: Secondary | ICD-10-CM | POA: Diagnosis not present

## 2017-07-12 DIAGNOSIS — Z79899 Other long term (current) drug therapy: Secondary | ICD-10-CM

## 2017-07-12 DIAGNOSIS — M545 Low back pain: Secondary | ICD-10-CM

## 2017-07-12 DIAGNOSIS — G8929 Other chronic pain: Secondary | ICD-10-CM | POA: Diagnosis not present

## 2017-07-12 LAB — LIPID PANEL
CHOL/HDL RATIO: 4
Cholesterol: 177 mg/dL (ref 0–200)
HDL: 48.4 mg/dL (ref >39.00)
NonHDL: 128.52
TRIGLYCERIDES: 227 mg/dL — AB (ref 0.0–149.0)
VLDL: 45.4 mg/dL — ABNORMAL HIGH (ref 0.0–40.0)

## 2017-07-12 LAB — BASIC METABOLIC PANEL
BUN: 14 mg/dL (ref 6–23)
CALCIUM: 9.6 mg/dL (ref 8.4–10.5)
CO2: 31 mEq/L (ref 19–32)
CREATININE: 0.85 mg/dL (ref 0.40–1.50)
Chloride: 102 mEq/L (ref 96–112)
GFR: 95.16 mL/min (ref >60.00)
GLUCOSE: 123 mg/dL — AB (ref 70–99)
POTASSIUM: 4.4 meq/L (ref 3.5–5.1)
Sodium: 140 mEq/L (ref 135–145)

## 2017-07-12 LAB — LDL CHOLESTEROL, DIRECT: Direct LDL: 109 mg/dL

## 2017-07-12 LAB — HEMOGLOBIN A1C: HEMOGLOBIN A1C: 6.4 % (ref 4.6–6.5)

## 2017-07-12 LAB — AST: AST: 21 U/L (ref 0–37)

## 2017-07-12 LAB — ALT: ALT: 25 U/L (ref 0–53)

## 2017-07-12 NOTE — Assessment & Plan Note (Signed)
DM: Check A1c HTN: On beta-blockers, check a BMP High cholesterol: On Lipitor, chest, FLP, AST, ALT Pain management, anxiety: On alprazolam, Soma, hydrocodone.  UDS and contract today Provider burnout: He takes care of his wife, she is essentially disabled, counseled to the best of my ability, consider to get a sitter and get some time for himself.  Information provided, see AVS.  Counseling? CAD, paroxysmal A. fib: Was seen in September 2018, echocardiogram and Myoview were normal.  Follow-up 03-2017, felt to be stable, was rec to go back to cardiology for the next 2019. MSK: Saw orthopedic surgery, got a local injection, right shoulder.  Told he has DJD in both shoulders at some point may need replacements. RTC 4-5 months.  Recommend a Medicare wellness with one of our nurses

## 2017-07-12 NOTE — Progress Notes (Addendum)
Subjective:    Patient ID: Brendan [name], male    DOB: 11/22/1950, 67 y.o.   MRN: [id number]  DOS:  07/12/2017 Type of visit - description : Routine visit Interval history: MSK, ortho note reviewed CAD: Cards note reviewed. Patient takes care of his wife, she is unable to do most ADLs has frequent falls, he is stressed about the situation  Prediabetes: Unable to exercise or eat healthy, has no time due to taking care of his wife Pain management: Due for a UDS and contract  Review of Systems Has no chest pain or difficulty breathing.  No lower extremity edema No depression per se, has on and off "blue" days. No suicidal ideas.    Past Medical History:  Diagnosis Date  . Allergy   . Anxiety and depression   . Arthritis   . Atrial flutter [facility])    New diagnosis November 10, 2010, rate controlled  . B12 deficiency   . Blurred vision    episode with confusion, evaluated Dr. Erling Cruz  . BPH (benign prostatic hypertrophy)   . CAD (coronary artery disease)    stent, 2001 / nuclear, June, 2010, no ischemia  . Diverticulosis of colon   . Dyslipidemia   . ED (erectile dysfunction)   . Ejection fraction   . GERD (gastroesophageal reflux disease)    pt denies, take omeprazole d/t taking several medications not GERD per pt  . History of colonic polyps   . HTN (hypertension)   . Lumbar back pain   . Obesity   . OSA (obstructive sleep apnea)   . PAF (paroxysmal atrial fibrillation) ([place])   . Peripheral neuropathy    lower extremities  . Prediabetes   . Pulmonary nodule   . Scalp lesion    November, 2011  . Sinus bradycardia   . Stroke ([place])    tia  . Tobacco use    hx of  . Vitamin D deficiency     Past Surgical History:  Procedure Laterality Date  . arm fracture surgery     remotely  . cardica stent x2    . CLAVICLE SURGERY     remotely  . TONSILLECTOMY      Social History   Socioeconomic History  . Marital status: Married    Spouse name: Not on file  . Number of  children: 0  . Years of education: Not on file  . Highest education level: Not on file  Social Needs  . Financial resource strain: Not on file  . Food insecurity - worry: Not on file  . Food insecurity - inability: Not on file  . Transportation needs - medical: Not on file  . Transportation needs - non-medical: Not on file  Occupational History  . 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 Smoking status: Current Every Day Smoker    Packs/day: 0.50    Years: 40.00    Pack years: 20.00    Types: Cigars  . Smokeless tobacco: Never Used  Substance and Sexual Activity  . Alcohol use: Yes    Alcohol/week: 3.0 oz    Types: 5 Standard drinks or equivalent per week    Comment: rare  . Drug use: No  . Sexual activity: Not Currently  Other Topics Concern  . Not on file  Social History Narrative   Lives w/ wife, he is the caregiver, she has muscle dysthrophy   No children of his own, wife's daughter-Gk live w/  them          Allergies as of 07/12/2017      Reactions   Tussionex Pennkinetic Er [hydrocod Polst-cpm Polst Er] Hives   Albuterol Palpitations   Qvar [beclomethasone] Palpitations      Medication List        Accurate as of 07/12/17 12:58 PM. Always use your most recent med list.          ALPRAZolam 0.5 MG tablet Commonly known as:  XANAX TAKE 1/2 TO 1 TABLET BY MOUTH THREE TIMES DAILY AS NEEDED FOR ANXIETY   atorvastatin 80 MG tablet Commonly known as:  LIPITOR Take 1 tablet (80 mg total) by mouth daily.   carisoprodol 350 MG tablet Commonly known as:  SOMA Take 1 tablet (350 mg total) by mouth 3 (three) times daily as needed for muscle spasms.   glucose blood test strip Use as instructed   HYDROcodone-acetaminophen 5-325 MG tablet Commonly known as:  NORCO/VICODIN Take 1 tablet by mouth 3 (three) times daily as needed.   metoprolol succinate 50 MG 24 hr tablet Commonly known as:  TOPROL-XL Take 75 mg by mouth daily. Take  with or immediately following a meal. Take with 25mg  tablet once a day   multivitamin tablet Take 1 tablet by mouth daily. Calcium, Magnesium, Zinc   omeprazole 20 MG capsule Commonly known as:  PRILOSEC Take 1 capsule (20 mg total) by mouth daily.   onetouch ultrasoft lancets Use as instructed   oxybutynin 10 MG 24 hr tablet Commonly known as:  DITROPAN-XL Take 10 mg by mouth at bedtime.   rivaroxaban 20 MG Tabs tablet Commonly known as:  XARELTO Take 1 tablet (20 mg total) by mouth daily with supper.   Vitamin D 2000 units Caps Take 1 capsule by mouth daily.          Objective:   Physical Exam BP 124/66 (BP Location: Left Arm, Patient Position: Sitting, Cuff Size: Normal)   Pulse (!) 56   Temp (!) 97.5 F (36.4 C) (Oral)   Resp 14   Ht 6\' 6"  (1.981 m)   Wt 289 lb 2 oz (131.1 kg)   SpO2 96%   BMI 33.41 kg/m  General:   Well developed, well nourished . NAD.  HEENT:  Normocephalic . Face symmetric, atraumatic Lungs:  CTA B Normal respiratory effort, no intercostal retractions, no accessory muscle use. Heart: RRR,  no murmur.  no pretibial edema bilaterally  Abdomen:  Not distended, soft, non-tender. No rebound or rigidity.   Skin: Not pale. Not jaundice Neurologic:  alert & oriented X3.  Speech normal, gait appropriate for age and unassisted Psych--  Cognition and judgment appear intact.  Cooperative with normal attention span and concentration.  Behavior appropriate. No anxious or depressed appearing.     Assessment & Plan:   Assessment Prediabetes Peripheral neuropathy (no pain, insensitive type) HTN Hyperlipidemia Anxiety depression -- on xanax  Chronic rhinitis- sees Dr Thornell Mule, ENT CV: Dr Stanford Breed --CAD. Stress test okay 05-2015 -- Paroxysmal Atrial fibrillation --TIA --Abdominal bruit: No AAA. Ultrasound 05/2015 OSA - + sleep study 03-2015 ,RX Cpap , not using it as off 01-2017  GU: BPH, Hypogonadism, ED. Sees urology B12 deficiency H/o  pulmonary nodule, CXR 3-16 stable, no further schedule imaging Pain mngmt: has back-shoulder  pain and neuropathy  On Soma and Vicodin. Etodolac DC 2016  per Dr Larose Kells d/t concerns regards anticoagulation. Hydrocodone dose needed to be increased Has seen Dr. Erling Cruz several times since 2010:w/u  included SPE, ACE, TSH, B12 (slightly low with a normal methylmalonic acid) 08-2008 had a lumbar spine MRI with multilevel spondylosis Dr. 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  UDS and contract today Provider burnout: He takes care of his wife, she is essentially disabled, counseled to the best of my ability, consider to get a sitter and get some time for himself.  Information provided, see AVS.  Counseling? CAD, paroxysmal A. fib: Was seen in September 2018, echocardiogram and Myoview were normal.  Follow-up 03-2017, felt to be stable, was rec to go back to cardiology for the next 2019. MSK: Saw orthopedic surgery, got a local injection, right shoulder.  Told he has DJD in both shoulders at some point may need replacements. RTC 4-5 months.  Recommend a Medicare wellness with one of our nurses

## 2017-07-12 NOTE — Patient Instructions (Addendum)
GO TO THE LAB : Get the blood work     GO TO THE FRONT DESK Schedule your next appointment for a checkup in 4-5 months.  Consider a Medicare wellness with one  of our nurses  Try to stay active, eat healthy    The 36-Hour Day a book by Eda Keys and Collier Salina Rabins is a detailed self-help guide for people caring for loved ones with Alzheimer's disease but it may be very valuable to you as well

## 2017-07-12 NOTE — Progress Notes (Signed)
Pre visit review using our clinic review tool, if applicable. No additional management support is needed unless otherwise documented below in the visit note. 

## 2017-07-15 ENCOUNTER — Other Ambulatory Visit: Payer: Self-pay

## 2017-07-15 DIAGNOSIS — R739 Hyperglycemia, unspecified: Secondary | ICD-10-CM

## 2017-07-15 DIAGNOSIS — E785 Hyperlipidemia, unspecified: Secondary | ICD-10-CM

## 2017-07-15 LAB — PAIN MGMT, PROFILE 8 W/CONF, U
6 ACETYLMORPHINE: NEGATIVE ng/mL (ref <10)
ALCOHOL METABOLITES: NEGATIVE ng/mL (ref <500)
ALPHAHYDROXYALPRAZOLAM: 71 ng/mL — AB (ref <25)
ALPHAHYDROXYMIDAZOLAM: NEGATIVE ng/mL (ref <50)
AMPHETAMINES: NEGATIVE ng/mL (ref <500)
Alphahydroxytriazolam: NEGATIVE ng/mL (ref <50)
Aminoclonazepam: NEGATIVE ng/mL (ref <25)
Benzodiazepines: POSITIVE ng/mL — AB (ref <100)
Buprenorphine, Urine: NEGATIVE ng/mL (ref <5)
COCAINE METABOLITE: NEGATIVE ng/mL (ref <150)
Creatinine: 29.6 mg/dL
Hydroxyethylflurazepam: NEGATIVE ng/mL (ref <50)
LORAZEPAM: NEGATIVE ng/mL (ref <50)
MDMA: NEGATIVE ng/mL (ref <500)
Marijuana Metabolite: NEGATIVE ng/mL (ref <20)
NORDIAZEPAM: NEGATIVE ng/mL (ref <50)
OPIATES: NEGATIVE ng/mL (ref <100)
OXIDANT: NEGATIVE ug/mL (ref <200)
Oxazepam: NEGATIVE ng/mL (ref <50)
Oxycodone: NEGATIVE ng/mL (ref <100)
PH: 6.07 (ref 4.5–9.0)
Temazepam: NEGATIVE ng/mL (ref <50)

## 2017-07-15 MED ORDER — ATORVASTATIN CALCIUM 80 MG PO TABS: 80.0000 mg | ORAL_TABLET | Freq: Every day | ORAL | 1 refills | Status: DC

## 2017-07-15 MED ORDER — EZETIMIBE 10 MG PO TABS: 10.0000 mg | ORAL_TABLET | Freq: Every day | ORAL | 3 refills | Status: DC

## 2017-07-15 NOTE — Progress Notes (Signed)
ze

## 2017-08-02 ENCOUNTER — Telehealth: Payer: Self-pay

## 2017-08-02 NOTE — Telephone Encounter (Signed)
Spoke w/ Pt, informed of recommendations. Pt verbalized understanding.  

## 2017-08-02 NOTE — Telephone Encounter (Signed)
For Brendan [name]: Rest, fluids, Tylenol, OTC cough medicines, and Flonase 2 sprays on each side of the nose congestion. We offer him a visit already. If he has high fevers, severe symptoms definitely needs to be seen. Regards his wife, needs to talk with his wife's doctor

## 2017-08-02 NOTE — Telephone Encounter (Signed)
Please advise 

## 2017-08-02 NOTE — Telephone Encounter (Signed)
Called pt and advised that he and his wife should both be seen. He wanted to inquire about OTC meds that he is taking and has requested a return call from the nurse.

## 2017-08-02 NOTE — Telephone Encounter (Signed)
Needs appt

## 2017-08-02 NOTE — Telephone Encounter (Signed)
Copied from [place] [id number]. Topic: General - Other >> Aug 02, 2017  9:47 AM Yvette Rack wrote: Reason for CRM: patient stating that he has flu like symptoms he has Headache loss of voice body aches chills fever runny nose chest congestion sneezing he would like for someone to give him a call back he also states that he is his wife care giver and she has the same symptoms

## 2017-08-03 ENCOUNTER — Ambulatory Visit: Payer: Self-pay

## 2017-08-03 ENCOUNTER — Other Ambulatory Visit: Payer: Self-pay | Admitting: Internal Medicine

## 2017-08-03 MED ORDER — OSELTAMIVIR PHOSPHATE 75 MG PO CAPS: 75.0000 mg | ORAL_CAPSULE | Freq: Two times a day (BID) | ORAL | 0 refills | Status: DC

## 2017-08-03 MED FILL — OSELTAMIVIR PHOSPHATE 75 MG: 75 | 5 days supply | Qty: 10 | Fill #0

## 2017-08-03 NOTE — Telephone Encounter (Signed)
Spoke w/ Pt, informed Tamiflu is being sent- informed I can send to [facility] that way he doesn't have to leave campus. Pt verbalized understanding. Rx sent.

## 2017-08-03 NOTE — Telephone Encounter (Signed)
Please advise 

## 2017-08-03 NOTE — Telephone Encounter (Signed)
Pt with sx for 3-4 days. Pt having sx of flu per pt. Chills, muscle aches, runny nose, headache, sneezing, sore throat. Wife is in hospital at [facility] with flu and having complications.  Asking if  Tamiflu could be called in to [facility]. Care advice given. Routing to provider.  Reason for Disposition . [1]Influenza EXPOSURE  (Close Contact) within last 7 days AND [2] NO respiratory symptoms . Influenza and antiviral drugs, questions about  Answer Assessment - Initial Assessment Questions 1. TYPE of EXPOSURE: "How were you exposed?" (e.g., close contact, not a close contact)     wife 2. DATE of EXPOSURE: "When did the exposure occur?" (e.g., hour, days, weeks)     It's pt's wife so he has been with her "all the time"- Wife was dx yesterday 3. PREGNANCY: "Is there any chance you are pregnant?" "When was your last menstrual period?"     n/a 4. HIGH RISK for COMPLICATIONS: "Do you have any heart or lung problems? Do you have a weakened immune system?" (e.g., CHF, COPD, asthma, HIV positive, chemotherapy, renal failure, diabetes mellitus, sickle cell anemia)     2 stents in 2001 5. SYMPTOMS: "Do you have any symptoms?" (e.g., cough, fever, sore throat, difficulty breathing).     Chills, muscle aches,runny nose, H/A, sneezing, sore throat  Protocols used: INFLUENZA EXPOSURE-A-AH

## 2017-08-03 NOTE — Telephone Encounter (Signed)
Patient exposed to influenza now with symptoms Recommend Tamiflu 75 mg twice a day for 5 days #10 no refills.

## 2017-08-23 NOTE — Progress Notes (Signed)
HPI: FU atrial arrhythmias. Cardiac catheterization performed in 2001 because of abnormal nuclear study showed a 70% LAD, 80% circumflex and 80% right coronary artery. LV function normal. The patient had PCI of his circumflex and right coronary artery. Event monitor January 2016 showed episode of coarse atrial fibrillation versus flutter. Abdominal ultrasound December 2016 showed no aneurysm.   Echocardiogram October 2018 showed normal LV function and grade 1 diastolic dysfunction.  Nuclear study October 2018 showed ejection fraction 57% and normal perfusion.  Since last seen, the patient denies any dyspnea on exertion, orthopnea, PND, pedal edema, palpitations, syncope or chest pain.   Current Outpatient Medications  Medication Sig Dispense Refill  . ALPRAZolam (XANAX) 0.5 MG tablet Take 0.5-1 tablets (0.25-0.5 mg total) by mouth 3 (three) times daily as needed for anxiety. 270 tablet 0  . atorvastatin (LIPITOR) 80 MG tablet Take 1 tablet (80 mg total) by mouth daily. 90 tablet 1  . carisoprodol (SOMA) 350 MG tablet Take 1 tablet (350 mg total) by mouth 3 (three) times daily as needed for muscle spasms. 90 tablet 5  . Cholecalciferol (VITAMIN D) 2000 UNITS CAPS Take 1 capsule by mouth daily.    Marland Kitchen ezetimibe (ZETIA) 10 MG tablet Take 1 tablet (10 mg total) by mouth daily. 90 tablet 3  . glucose blood test strip Use as instructed 100 each 12  . HYDROcodone-acetaminophen (NORCO/VICODIN) 5-325 MG tablet Take 1 tablet by mouth 3 (three) times daily as needed. 90 tablet 0  . Lancets (ONETOUCH ULTRASOFT) lancets Use as instructed 100 each 12  . metoprolol succinate (TOPROL-XL) 50 MG 24 hr tablet Take 75 mg by mouth daily. Take with or immediately following a meal. Take with 25mg  tablet once a day     . Multiple Vitamin (MULTIVITAMIN) tablet Take 1 tablet by mouth daily. Calcium, Magnesium, Zinc    . omeprazole (PRILOSEC) 20 MG capsule Take 1 capsule (20 mg total) by mouth daily. 90 capsule 3  .  oxybutynin (DITROPAN-XL) 10 MG 24 hr tablet Take 10 mg by mouth at bedtime.    . rivaroxaban (XARELTO) 20 MG TABS tablet Take 1 tablet (20 mg total) by mouth daily with supper. 90 tablet 3   No current facility-administered medications for this visit.      Past Medical History:  Diagnosis Date  . Allergy   . Anxiety and depression   . Arthritis   . Atrial flutter [facility])    New diagnosis November 10, 2010, rate controlled  . B12 deficiency   . Blurred vision    episode with confusion, evaluated Dr. Erling Cruz  . BPH (benign prostatic hypertrophy)   . CAD (coronary artery disease)    stent, 2001 / nuclear, June, 2010, no ischemia  . Diverticulosis of colon   . Dyslipidemia   . ED (erectile dysfunction)   . Ejection fraction   . GERD (gastroesophageal reflux disease)    pt denies, take omeprazole d/t taking several medications not GERD per pt  . History of colonic polyps   . HTN (hypertension)   . Lumbar back pain   . Obesity   . OSA (obstructive sleep apnea)   . PAF (paroxysmal atrial fibrillation) ([place])   . Peripheral neuropathy    lower extremities  . Prediabetes   . Pulmonary nodule   . Scalp lesion    November, 2011  . Sinus bradycardia   . Stroke ([place])    tia  . Tobacco use    hx of  .  Vitamin D deficiency     Past Surgical History:  Procedure Laterality Date  . arm fracture surgery     remotely  . cardica stent x2    . CLAVICLE SURGERY     remotely  . TONSILLECTOMY      Social History   Socioeconomic History  . Marital status: Married    Spouse name: Not on file  . Number of children: 0  . Years of education: Not on file  . Highest education level: Not on file  Occupational History  . Occupation: AC[employer] M[employer]    Employer: [employer]  Social Needs  . Financial resource strain: Not on file  . Food insecurity:    Worry: Not on file    Inability: Not on file  . Transportation needs:    Medical: Not on file    Non-medical: Not on file  Tobacco  Use  . Smoking status: Current Every Day Smoker    Packs/day: 0.50    Years: 40.00    Pack years: 20.00    Types: Cigars  . Smokeless tobacco: Never Used  Substance and Sexual Activity  . Alcohol use: Yes    Alcohol/week: 3.0 oz    Types: 5 Standard drinks or equivalent per week    Comment: rare  . Drug use: No  . Sexual activity: Not Currently  Lifestyle  . Physical activity:    Days per week: Not on file    Minutes per session: Not on file  . Stress: Not on file  Relationships  . Social connections:    Talks on phone: Not on file    Gets together: Not on file    Attends religious service: Not on file    Active member of club or organization: Not on file    Attends meetings of clubs or organizations: Not on file    Relationship status: Not on file  . Intimate partner violence:    Fear of current or ex partner: Not on file    Emotionally abused: Not on file    Physically abused: Not on file    Forced sexual activity: Not on file  Other Topics Concern  . Not on file  Social History Narrative   Lives w/ wife, he is the caregiver, she has muscle dysthrophy   No children of his own, wife's daughter-Gk live w/ them        Family History  Problem Relation Age of Onset  . Stroke Mother   . Alzheimer's disease Mother        Jerilynn Mages, brother   . Heart failure Father   . Stroke Father   . Alzheimer's disease Paternal Aunt   . Colon cancer Neg Hx   . Esophageal cancer Neg Hx   . Rectal cancer Neg Hx   . Stomach cancer Neg Hx   . Prostate cancer Neg Hx     ROS: no fevers or chills, productive cough, hemoptysis, dysphasia, odynophagia, melena, hematochezia, dysuria, hematuria, rash, seizure activity, orthopnea, PND, pedal edema, claudication. Remaining systems are negative.  Physical Exam: Well-developed well-nourished in no acute distress.  Skin is warm and dry.  HEENT is normal.  Neck is supple.  Chest is clear to auscultation with normal expansion.  Cardiovascular exam  is regular rate and rhythm.  Abdominal exam nontender or distended. No masses palpated. Extremities show no edema. neuro grossly intact   A/P  1 coronary artery disease-plan to continue statin.  No aspirin given need for anticoagulation.  Recent  nuclear study showed no ischemia.  2 paroxysmal atrial fibrillation-patient remains in sinus rhythm today.  Continue beta-blocker for rate control if atrial fibrillation recurs.  Continue Xarelto.  3 hypertension-blood pressure is controlled.  Continue present medications.  4 hyperlipidemia-continue statin.  5 tobacco abuse-patient counseled on discontinuing.  Kirk Ruths, MD

## 2017-08-31 ENCOUNTER — Telehealth: Payer: Self-pay | Admitting: Internal Medicine

## 2017-08-31 NOTE — Telephone Encounter (Signed)
Pt is requesting refill on alprazolam.   Last OV: 07/12/2017 Last Fill: 06/09/2017 #270 and 0RF UDS: 07/12/2017 Low risk   NCCR printed 07/12/2017 no issues noted  Please advise.

## 2017-08-31 NOTE — Telephone Encounter (Signed)
Sent!

## 2017-09-01 ENCOUNTER — Encounter: Payer: Self-pay | Admitting: Cardiology

## 2017-09-01 ENCOUNTER — Ambulatory Visit (INDEPENDENT_AMBULATORY_CARE_PROVIDER_SITE_OTHER): Payer: Medicare Other | Admitting: Cardiology

## 2017-09-01 VITALS — BP 121/78 | HR 67 | Ht 78.0 in | Wt 281.1 lb

## 2017-09-01 DIAGNOSIS — I10 Essential (primary) hypertension: Secondary | ICD-10-CM | POA: Diagnosis not present

## 2017-09-01 DIAGNOSIS — I2510 Atherosclerotic heart disease of native coronary artery without angina pectoris: Secondary | ICD-10-CM

## 2017-09-01 DIAGNOSIS — I480 Paroxysmal atrial fibrillation: Secondary | ICD-10-CM

## 2017-09-01 DIAGNOSIS — E7800 Pure hypercholesterolemia, unspecified: Secondary | ICD-10-CM

## 2017-09-01 NOTE — Patient Instructions (Signed)
Medication Instructions:  Your physician recommends that you continue on your current medications as directed. Please refer to the Current Medication list given to you today.  Labwork: -None  Testing/Procedures: -None  Follow-Up: Your physician wants you to follow-up in: 1 year with Dr. Crenshaw. You will receive a reminder letter in the mail two months in advance. If you don't receive a letter, please call our office to schedule the follow-up appointment.   Any Other Special Instructions Will Be Listed Below (If Applicable).     If you need a refill on your cardiac medications before your next appointment, please call your pharmacy.   

## 2017-09-07 ENCOUNTER — Other Ambulatory Visit: Payer: Self-pay | Admitting: Internal Medicine

## 2017-09-23 ENCOUNTER — Other Ambulatory Visit: Payer: Self-pay | Admitting: Internal Medicine

## 2017-11-02 ENCOUNTER — Telehealth: Payer: Self-pay | Admitting: Internal Medicine

## 2017-11-02 MED ORDER — HYDROCODONE-ACETAMINOPHEN 5-325 MG PO TABS: 1.0000 | ORAL_TABLET | Freq: Three times a day (TID) | ORAL | 0 refills | Status: DC | PRN

## 2017-11-02 NOTE — Telephone Encounter (Signed)
Copied from [place] [id number]. Topic: Quick Communication - Rx Refill/Question >> Nov 02, 2017  8:48 AM Scherrie Gerlach wrote: Medication: HYDROcodone-acetaminophen (NORCO/VICODIN) 5-325 MG tablet [facility] - [place],  - [address] [phone] (Phone) [phone] (Fax) Pt states he has acute arthritis in both shoulders and takes for this pain.  Pt states his wife passed  on May 4 and he wants Dr Larose Kells to know

## 2017-11-02 NOTE — Telephone Encounter (Signed)
Sent!

## 2017-11-02 NOTE — Telephone Encounter (Signed)
Pt is requesting refill on hydrocodone 5-325mg .   Last OV: 07/12/2017 Last Fill: 01/11/2017 #90 and 0RF (Pt sig: 1 tab tid prn) UDS: 07/12/2017 Low risk  NCCR printed- no discrepancies noted- sent for scanning  Please advise.

## 2017-11-08 DIAGNOSIS — M19012 Primary osteoarthritis, left shoulder: Secondary | ICD-10-CM | POA: Diagnosis not present

## 2017-11-08 DIAGNOSIS — M19011 Primary osteoarthritis, right shoulder: Secondary | ICD-10-CM | POA: Diagnosis not present

## 2017-11-11 DIAGNOSIS — H35033 Hypertensive retinopathy, bilateral: Secondary | ICD-10-CM | POA: Diagnosis not present

## 2017-11-11 DIAGNOSIS — H2513 Age-related nuclear cataract, bilateral: Secondary | ICD-10-CM | POA: Diagnosis not present

## 2017-11-11 DIAGNOSIS — H524 Presbyopia: Secondary | ICD-10-CM | POA: Diagnosis not present

## 2017-11-11 DIAGNOSIS — H52223 Regular astigmatism, bilateral: Secondary | ICD-10-CM | POA: Diagnosis not present

## 2017-11-11 DIAGNOSIS — H43813 Vitreous degeneration, bilateral: Secondary | ICD-10-CM | POA: Diagnosis not present

## 2017-12-03 ENCOUNTER — Telehealth: Payer: Self-pay | Admitting: Internal Medicine

## 2017-12-03 ENCOUNTER — Other Ambulatory Visit: Payer: Self-pay | Admitting: Internal Medicine

## 2017-12-03 NOTE — Telephone Encounter (Signed)
Refill sent.

## 2017-12-03 NOTE — Telephone Encounter (Signed)
Pt is requesting refill on alprazolam.   Last OV: 07/12/2017 Last Fill: 08/31/2017 #270 and 0RF (Pt sig: 1/2 to 1 tab tid prn) UDS: 07/12/2017 Low risk  Please advise.

## 2017-12-13 ENCOUNTER — Other Ambulatory Visit: Payer: Self-pay

## 2017-12-14 ENCOUNTER — Encounter: Payer: Self-pay | Admitting: Internal Medicine

## 2017-12-14 ENCOUNTER — Ambulatory Visit (INDEPENDENT_AMBULATORY_CARE_PROVIDER_SITE_OTHER): Payer: Medicare Other | Admitting: Internal Medicine

## 2017-12-14 ENCOUNTER — Ambulatory Visit (HOSPITAL_BASED_OUTPATIENT_CLINIC_OR_DEPARTMENT_OTHER)
Admission: RE | Admit: 2017-12-14 | Discharge: 2017-12-14 | Disposition: A | Discharge status: Home / Self Care | Payer: Medicare Other | Source: Ambulatory Visit | Attending: Internal Medicine | Admitting: Internal Medicine

## 2017-12-14 VITALS — BP 126/80 | HR 81 | Temp 98.1°F | Resp 16 | Ht 78.0 in | Wt 278.5 lb

## 2017-12-14 DIAGNOSIS — F17210 Nicotine dependence, cigarettes, uncomplicated: Secondary | ICD-10-CM | POA: Diagnosis not present

## 2017-12-14 DIAGNOSIS — F17200 Nicotine dependence, unspecified, uncomplicated: Secondary | ICD-10-CM

## 2017-12-14 DIAGNOSIS — F341 Dysthymic disorder: Secondary | ICD-10-CM

## 2017-12-14 DIAGNOSIS — Z122 Encounter for screening for malignant neoplasm of respiratory organs: Secondary | ICD-10-CM

## 2017-12-14 DIAGNOSIS — I10 Essential (primary) hypertension: Secondary | ICD-10-CM | POA: Diagnosis not present

## 2017-12-14 DIAGNOSIS — I25118 Atherosclerotic heart disease of native coronary artery with other forms of angina pectoris: Secondary | ICD-10-CM | POA: Diagnosis not present

## 2017-12-14 DIAGNOSIS — R739 Hyperglycemia, unspecified: Secondary | ICD-10-CM | POA: Diagnosis not present

## 2017-12-14 DIAGNOSIS — I2510 Atherosclerotic heart disease of native coronary artery without angina pectoris: Secondary | ICD-10-CM

## 2017-12-14 DIAGNOSIS — I700 Atherosclerosis of aorta: Secondary | ICD-10-CM | POA: Insufficient documentation

## 2017-12-14 DIAGNOSIS — J439 Emphysema, unspecified: Secondary | ICD-10-CM | POA: Insufficient documentation

## 2017-12-14 DIAGNOSIS — E785 Hyperlipidemia, unspecified: Secondary | ICD-10-CM

## 2017-12-14 LAB — CBC WITH DIFFERENTIAL/PLATELET
BASOS PCT: 0.7 % (ref 0.0–3.0)
Basophils Absolute: 0 10*3/uL (ref 0.0–0.1)
EOS ABS: 0.1 10*3/uL (ref 0.0–0.7)
Eosinophils Relative: 2.5 % (ref 0.0–5.0)
HEMATOCRIT: 48.5 % (ref 39.0–52.0)
Hemoglobin: 16.8 g/dL (ref 13.0–17.0)
LYMPHS PCT: 33.3 % (ref 12.0–46.0)
Lymphs Abs: 1.9 10*3/uL (ref 0.7–4.0)
MCHC: 34.8 g/dL (ref 30.0–36.0)
MCV: 92.7 fl (ref 78.0–100.0)
Monocytes Absolute: 0.4 10*3/uL (ref 0.1–1.0)
Monocytes Relative: 7.1 % (ref 3.0–12.0)
NEUTROS ABS: 3.2 10*3/uL (ref 1.4–7.7)
NEUTROS PCT: 56.4 % (ref 43.0–77.0)
Platelets: 181 10*3/uL (ref 150.0–400.0)
RBC: 5.23 Mil/uL (ref 4.22–5.81)
RDW: 13.1 % (ref 11.5–15.5)
WBC: 5.7 10*3/uL (ref 4.0–10.5)

## 2017-12-14 LAB — HEMOGLOBIN A1C: Hgb A1c MFr Bld: 6.8 % — ABNORMAL HIGH (ref 4.6–6.5)

## 2017-12-14 LAB — LIPID PANEL
CHOLESTEROL: 134 mg/dL (ref 0–200)
HDL: 36.5 mg/dL — ABNORMAL LOW (ref >39.00)
LDL CALC: 62 mg/dL (ref 0–99)
NonHDL: 97.35
Total CHOL/HDL Ratio: 4
Triglycerides: 177 mg/dL — ABNORMAL HIGH (ref 0.0–149.0)
VLDL: 35.4 mg/dL (ref 0.0–40.0)

## 2017-12-14 NOTE — Progress Notes (Signed)
Subjective:    Patient ID: Brendan [name], male    DOB: 1950-10-21, 67 y.o.   MRN: [id number]  DOS:  12/14/2017 Type of visit - description : rov Interval history: Since the last visit, he lost his wife, having a very difficult time.  Has been offered counseling by hospice Tobacco abuse: smoking again, more than a pack a day. CAD: Note from cardiology reviewed Pain management: Saw an orthopedic doctor, getting local injection of the shoulders, great relief.   Review of Systems Denies cough, wheezing or sputum production.    No chest pain or lower extremity edema.  Past Medical History:  Diagnosis Date  . Allergy   . Anxiety and depression   . Arthritis   . Atrial flutter [facility])    New diagnosis November 10, 2010, rate controlled  . B12 deficiency   . Blurred vision    episode with confusion, evaluated Dr. Erling Cruz  . BPH (benign prostatic hypertrophy)   . CAD (coronary artery disease)    stent, 2001 / nuclear, June, 2010, no ischemia  . Diverticulosis of colon   . Dyslipidemia   . ED (erectile dysfunction)   . Ejection fraction   . GERD (gastroesophageal reflux disease)    pt denies, take omeprazole d/t taking several medications not GERD per pt  . History of colonic polyps   . HTN (hypertension)   . Lumbar back pain   . Obesity   . OSA (obstructive sleep apnea)   . PAF (paroxysmal atrial fibrillation) ([place])   . Peripheral neuropathy    lower extremities  . Prediabetes   . Pulmonary nodule   . Scalp lesion    November, 2011  . Sinus bradycardia   . Stroke ([place])    tia  . Tobacco use    hx of  . Vitamin D deficiency     Past Surgical History:  Procedure Laterality Date  . arm fracture surgery     remotely  . cardica stent x2    . CLAVICLE SURGERY     remotely  . TONSILLECTOMY      Social History   Socioeconomic History  . Marital status: Widowed    Spouse name: Not on file  . Number of children: 0  . Years of education: Not on file  . Highest education  level: Not on file  Occupational History  . Occupation: retired---[employer]    Employer: [employer]  . Financial resource strain: Not on file  . Food insecurity:    Worry: Not on file    Inability: Not on file  . Transportation needs:    Medical: Not on file    Non-medical: Not on file  Tobacco Use  . Smoking status: Current Every Day Smoker    Packs/day: 1.00    Years: 40.00    Pack years: 40.00    Types: Cigars  . Smokeless tobacco: Never Used  Substance and Sexual Activity  . Alcohol use: Yes    Alcohol/week: 3.0 oz    Types: 5 Standard drinks or equivalent per week    Comment: rare  . Drug use: No  . Sexual activity: Not Currently  Lifestyle  . Physical activity:    Days per week: Not on file    Minutes per session: Not on file  . Stress: Not on file  Relationships  . Social connections:    Talks on phone: Not on file    Gets together: Not on file  Attends religious service: Not on file    Active member of club or organization: Not on file    Attends meetings of clubs or organizations: Not on file    Relationship status: Not on file  . Intimate partner violence:    Fear of current or ex partner: Not on file    Emotionally abused: Not on file    Physically abused: Not on file    Forced sexual activity: Not on file  Other Topics Concern  . Not on file  Social History Narrative   Lives w/ wife, he is the caregiver, she has muscle dysthrophy--lost wife ~10/2017   No children of his own, wife's daughter-Gk live w/ them          Allergies as of 12/14/2017      Reactions   Tussionex Pennkinetic Er [hydrocod Polst-cpm Polst Er] Hives   Albuterol Palpitations   Qvar [beclomethasone] Palpitations      Medication List        Accurate as of 12/14/17  5:25 PM. Always use your most recent med list.          ALPRAZolam 0.5 MG tablet Commonly known as:  XANAX TAKE 1/2 TO 1 TABLET BY MOUTH THREE TIMES DAILY AS NEEDED FOR ANXIETY     atorvastatin 80 MG tablet Commonly known as:  LIPITOR Take 1 tablet (80 mg total) by mouth daily.   carisoprodol 350 MG tablet Commonly known as:  SOMA Take 1 tablet (350 mg total) by mouth 3 (three) times daily as needed for muscle spasms.   ezetimibe 10 MG tablet Commonly known as:  ZETIA Take 1 tablet (10 mg total) by mouth daily.   glucose blood test strip Use as instructed   HYDROcodone-acetaminophen 5-325 MG tablet Commonly known as:  NORCO/VICODIN Take 1 tablet by mouth 3 (three) times daily as needed.   metoprolol succinate 50 MG 24 hr tablet Commonly known as:  TOPROL-XL Take 1.5 tablets by mouth once daily. Take with or immediately following a meal.   multivitamin tablet Take 1 tablet by mouth daily. Calcium, Magnesium, Zinc   omeprazole 20 MG capsule Commonly known as:  PRILOSEC Take 1 capsule (20 mg total) by mouth daily.   onetouch ultrasoft lancets Use as instructed   oxybutynin 10 MG 24 hr tablet Commonly known as:  DITROPAN-XL Take 10 mg by mouth at bedtime.   rivaroxaban 20 MG Tabs tablet Commonly known as:  XARELTO Take 1 tablet (20 mg total) by mouth daily with supper.   Vitamin D 2000 units Caps Take 1 capsule by mouth daily.          Objective:   Physical Exam BP 126/80 (BP Location: Left Arm, Patient Position: Sitting, Cuff Size: Normal)   Pulse 81   Temp 98.1 F (36.7 C) (Oral)   Resp 16   Ht 6\' 6"  (1.981 m)   Wt 278 lb 8 oz (126.3 kg)   SpO2 98%   BMI 32.18 kg/m  General:   Well developed, NAD, see BMI.  HEENT:  Normocephalic . Face symmetric, atraumatic Lungs:  CTA B Normal respiratory effort, no intercostal retractions, no accessory muscle use. Heart: RRR,  no murmur.  No pretibial edema bilaterally  Skin: Not pale. Not jaundice Neurologic:  alert & oriented X3.  Speech normal, gait appropriate for age and unassisted Psych--  Cognition and judgment appear intact.  Cooperative with normal attention span and  concentration.  Behavior appropriate. No anxious or depressed appearing.  Assessment & Plan:  Assessment Prediabetes Peripheral neuropathy (no pain, insensitive type) HTN Hyperlipidemia Anxiety depression -- on xanax  Chronic rhinitis- sees Dr Thornell Mule, ENT CV: Dr Stanford Breed --CAD. Stress test okay 05-2015 -- Paroxysmal Atrial fibrillation --TIA --Abdominal bruit: No AAA. Ultrasound 05/2015 OSA - + sleep study 03-2015 ,RX Cpap , not using it as off 01-2017  GU: BPH, Hypogonadism, ED. Sees urology B12 deficiency H/o pulmonary nodule, CXR 3-16 stable, no further schedule imaging Pain mngmt: has back-shoulder  pain and neuropathy  On Soma and Vicodin. Etodolac DC 2016  per Dr Larose Kells d/t concerns regards anticoagulation. Hydrocodone dose needed to be increased Has seen Dr. Erling Cruz several times since 2010:w/u  included SPE, ACE, TSH, B12 (slightly low with a normal methylmalonic acid) 08-2008 had a lumbar spine MRI with multilevel spondylosis Dr. 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 Pain management, anxiety: Since the last visit, he got local injections for shoulder DJD, got great relief, his need to take pain medication has decreased significantly. Also, lost his wife, having a very difficult time, denies suicidal ideas.  Has counseling available through hospice, encouraged to take advantage of that. DJD: As above CAD, A. fib, saw cardiology 09/01/2017, felt to be stable.  Check CBC Tobacco abuse: Ongoing, counseled, recommend screening CT chest.  Pros and cons discussed, elected to proceed. Preventive care: Immunizations reviewed, recommend  Shingrix at his pharmacy Had a colonoscopy 2016, essentially negative, was rec a  FOBT 2021, redo Cscope in 10 years RTC 4 months

## 2017-12-14 NOTE — Assessment & Plan Note (Signed)
Prediabetes: Last A1c 6.4, has not been doing well with diet and exercise lately, counseled, check a A1c High cholesterol: Last LDL 5 months ago was 109, on Lipitor, Zetia was added, check a FLP. Pain management, anxiety: Since the last visit, he got local injections for shoulder DJD, got great relief, his need to take pain medication has decreased significantly. Also, lost his wife, having a very difficult time, denies suicidal ideas.  Has counseling available through hospice, encouraged to take advantage of that. DJD: As above CAD, A. fib, saw cardiology 09/01/2017, felt to be stable.  Check CBC Tobacco abuse: Ongoing, counseled, recommend screening CT chest.  Pros and cons discussed, elected to proceed. Preventive care: Immunizations reviewed, recommend Shingrix at his pharmacy Had a colonoscopy 2016, essentially negative, was rec a  FOBT 2021, redo Cscope in 10 years RTC 4 months

## 2017-12-14 NOTE — Progress Notes (Signed)
Pre visit review using our clinic review tool, if applicable. No additional management support is needed unless otherwise documented below in the visit note. 

## 2017-12-14 NOTE — Patient Instructions (Addendum)
GO TO THE LAB : Get the blood work     GO TO THE FRONT DESK Schedule your next appointment for a checkup in 4 months.  Please consider getting a SHINGRIX shot at your pharmacy   Steps to Quit Smoking Smoking tobacco can be harmful to your health and can affect almost every organ in your body. Smoking puts you, and those around you, at risk for developing many serious chronic diseases. Quitting smoking is difficult, but it is one of the best things that you can do for your health. It is never too late to quit. What are the benefits of quitting smoking? When you quit smoking, you lower your risk of developing serious diseases and conditions, such as:  Lung cancer or lung disease, such as COPD.  Heart disease.  Stroke.  Heart attack.  Infertility.  Osteoporosis and bone fractures.  Additionally, symptoms such as coughing, wheezing, and shortness of breath may get better when you quit. You may also find that you get sick less often because your body is stronger at fighting off colds and infections. If you are pregnant, quitting smoking can help to reduce your chances of having a baby of low birth weight. How do I get ready to quit? When you decide to quit smoking, create a plan to make sure that you are successful. Before you quit:  Pick a date to quit. Set a date within the next two weeks to give you time to prepare.  Write down the reasons why you are quitting. Keep this list in places where you will see it often, such as on your bathroom mirror or in your car or wallet.  Identify the people, places, things, and activities that make you want to smoke (triggers) and avoid them. Make sure to take these actions: ? Throw away all cigarettes at home, at work, and in your car. ? Throw away smoking accessories, such as Scientist, research (medical). ? Clean your car and make sure to empty the ashtray. ? Clean your home, including curtains and carpets.  Tell your family, friends, and coworkers  that you are quitting. Support from your loved ones can make quitting easier.  Talk with your health care provider about your options for quitting smoking.  Find out what treatment options are covered by your health insurance.  What strategies can I use to quit smoking? Talk with your healthcare provider about different strategies to quit smoking. Some strategies include:  Quitting smoking altogether instead of gradually lessening how much you smoke over a period of time. Research shows that quitting "cold [place]" is more successful than gradually quitting.  Attending in-person counseling to help you build problem-solving skills. You are more likely to have success in quitting if you attend several counseling sessions. Even short sessions of 10 minutes can be effective.  Finding resources and support systems that can help you to quit smoking and remain smoke-free after you quit. These resources are most helpful when you use them often. They can include: ? Online chats with a Social worker. ? Telephone quitlines. ? Careers information officer. ? Support groups or group counseling. ? Text messaging programs. ? Mobile phone applications.  Taking medicines to help you quit smoking. (If you are pregnant or breastfeeding, talk with your health care provider first.) Some medicines contain nicotine and some do not. Both types of medicines help with cravings, but the medicines that include nicotine help to relieve withdrawal symptoms. Your health care provider may recommend: ? Nicotine patches, gum, or  lozenges. ? Nicotine inhalers or sprays. ? Non-nicotine medicine that is taken by mouth.  Talk with your health care provider about combining strategies, such as taking medicines while you are also receiving in-person counseling. Using these two strategies together makes you more likely to succeed in quitting than if you used either strategy on its own. If you are pregnant or breastfeeding, talk with  your health care provider about finding counseling or other support strategies to quit smoking. Do not take medicine to help you quit smoking unless told to do so by your health care provider. What things can I do to make it easier to quit? Quitting smoking might feel overwhelming at first, but there is a lot that you can do to make it easier. Take these important actions:  Reach out to your family and friends and ask that they support and encourage you during this time. Call telephone quitlines, reach out to support groups, or work with a counselor for support.  Ask people who smoke to avoid smoking around you.  Avoid places that trigger you to smoke, such as bars, parties, or smoke-break areas at work.  Spend time around people who do not smoke.  Lessen stress in your life, because stress can be a smoking trigger for some people. To lessen stress, try: ? Exercising regularly. ? Deep-breathing exercises. ? Yoga. ? Meditating. ? Performing a body scan. This involves closing your eyes, scanning your body from head to toe, and noticing which parts of your body are particularly tense. Purposefully relax the muscles in those areas.  Download or purchase mobile phone or tablet apps (applications) that can help you stick to your quit plan by providing reminders, tips, and encouragement. There are many free apps, such as QuitGuide from the [employer] Office manager for Disease Control and Prevention). You can find other support for quitting smoking (smoking cessation) through smokefree.gov and other websites.  How will I feel when I quit smoking? Within the first 24 hours of quitting smoking, you may start to feel some withdrawal symptoms. These symptoms are usually most noticeable 2-3 days after quitting, but they usually do not last beyond 2-3 weeks. Changes or symptoms that you might experience include:  Mood swings.  Restlessness, anxiety, or irritation.  Difficulty concentrating.  Dizziness.  Strong  cravings for sugary foods in addition to nicotine.  Mild weight gain.  Constipation.  Nausea.  Coughing or a sore throat.  Changes in how your medicines work in your body.  A depressed mood.  Difficulty sleeping (insomnia).  After the first 2-3 weeks of quitting, you may start to notice more positive results, such as:  Improved sense of smell and taste.  Decreased coughing and sore throat.  Slower heart rate.  Lower blood pressure.  Clearer skin.  The ability to breathe more easily.  Fewer sick days.  Quitting smoking is very challenging for most people. Do not get discouraged if you are not successful the first time. Some people need to make many attempts to quit before they achieve long-term success. Do your best to stick to your quit plan, and talk with your health care provider if you have any questions or concerns. This information is not intended to replace advice given to you by your health care provider. Make sure you discuss any questions you have with your health care provider. Document Released: 05/19/2001 Document Revised: 01/21/2016 Document Reviewed: 10/09/2014 Elsevier Interactive Patient Education  [employer].

## 2017-12-16 ENCOUNTER — Encounter: Payer: Self-pay | Admitting: Cardiology

## 2018-02-04 ENCOUNTER — Other Ambulatory Visit: Payer: Self-pay | Admitting: Internal Medicine

## 2018-03-02 ENCOUNTER — Telehealth: Payer: Self-pay | Admitting: Internal Medicine

## 2018-03-02 NOTE — Telephone Encounter (Signed)
Pt is requesting refill on alprazolam.   Last OV: 12/14/2017 Last Fill: 12/03/2017 #270 and 0RF UDS: 07/12/2017 Low risk  NCCR printed- no discrepancies- sent for scanning.

## 2018-03-02 NOTE — Telephone Encounter (Signed)
Sent!

## 2018-03-08 DIAGNOSIS — M19011 Primary osteoarthritis, right shoulder: Secondary | ICD-10-CM | POA: Diagnosis not present

## 2018-03-08 DIAGNOSIS — M19012 Primary osteoarthritis, left shoulder: Secondary | ICD-10-CM | POA: Diagnosis not present

## 2018-03-10 ENCOUNTER — Telehealth: Payer: Self-pay | Admitting: Internal Medicine

## 2018-03-10 DIAGNOSIS — Z23 Encounter for immunization: Secondary | ICD-10-CM | POA: Diagnosis not present

## 2018-03-10 NOTE — Telephone Encounter (Signed)
Immunization record updated.

## 2018-03-10 NOTE — Telephone Encounter (Signed)
Copied from [place] [id number]. Topic: Quick Communication - See Telephone Encounter >> Mar 10, 2018 12:51 PM Rutherford Nail, NT wrote: CRM for notification. See Telephone encounter for: 03/10/18. Patient calling and states that he just walked out of [place] [facility] from getting his flu shot. Wanted to let Dr Larose Kells know.

## 2018-03-31 ENCOUNTER — Other Ambulatory Visit: Payer: Self-pay | Admitting: Internal Medicine

## 2018-03-31 DIAGNOSIS — E785 Hyperlipidemia, unspecified: Secondary | ICD-10-CM

## 2018-04-18 ENCOUNTER — Encounter: Payer: Self-pay | Admitting: Internal Medicine

## 2018-04-18 ENCOUNTER — Ambulatory Visit (INDEPENDENT_AMBULATORY_CARE_PROVIDER_SITE_OTHER): Payer: Medicare Other | Admitting: Internal Medicine

## 2018-04-18 VITALS — BP 128/76 | HR 59 | Temp 97.8°F | Resp 16 | Ht 78.0 in | Wt 277.0 lb

## 2018-04-18 DIAGNOSIS — F341 Dysthymic disorder: Secondary | ICD-10-CM

## 2018-04-18 DIAGNOSIS — Z09 Encounter for follow-up examination after completed treatment for conditions other than malignant neoplasm: Secondary | ICD-10-CM

## 2018-04-18 DIAGNOSIS — J449 Chronic obstructive pulmonary disease, unspecified: Secondary | ICD-10-CM | POA: Diagnosis not present

## 2018-04-18 DIAGNOSIS — Z0000 Encounter for general adult medical examination without abnormal findings: Secondary | ICD-10-CM

## 2018-04-18 DIAGNOSIS — I2510 Atherosclerotic heart disease of native coronary artery without angina pectoris: Secondary | ICD-10-CM | POA: Diagnosis not present

## 2018-04-18 DIAGNOSIS — E785 Hyperlipidemia, unspecified: Secondary | ICD-10-CM | POA: Diagnosis not present

## 2018-04-18 DIAGNOSIS — I10 Essential (primary) hypertension: Secondary | ICD-10-CM | POA: Diagnosis not present

## 2018-04-18 NOTE — Progress Notes (Signed)
Subjective:    Patient ID: Brendan [name], male    DOB: Jan 03, 1951, 67 y.o.   MRN: [id number]  DOS:  04/18/2018 Type of visit - description : f/u Interval history: Pain management: Takes hydrocodone and Soma sporadically Grieving: Lost his wife, went to some counseling, it has helped significantly. Moved to a new house, doing better.  Review of Systems Denies chest pain, difficulty breathing No nausea, vomiting, diarrhea  Past Medical History:  Diagnosis Date  . Allergy   . Anxiety and depression   . Arthritis   . Atrial flutter [facility])    New diagnosis November 10, 2010, rate controlled  . B12 deficiency   . Blurred vision    episode with confusion, evaluated Dr. Erling Cruz  . BPH (benign prostatic hypertrophy)   . CAD (coronary artery disease)    stent, 2001 / nuclear, June, 2010, no ischemia  . Diverticulosis of colon   . Dyslipidemia   . ED (erectile dysfunction)   . Ejection fraction   . GERD (gastroesophageal reflux disease)    pt denies, take omeprazole d/t taking several medications not GERD per pt  . History of colonic polyps   . HTN (hypertension)   . Lumbar back pain   . Obesity   . OSA (obstructive sleep apnea)   . PAF (paroxysmal atrial fibrillation) ([place])   . Peripheral neuropathy    lower extremities  . Prediabetes   . Pulmonary nodule   . Scalp lesion    November, 2011  . Sinus bradycardia   . Stroke ([place])    tia  . Tobacco use    hx of  . Vitamin D deficiency     Past Surgical History:  Procedure Laterality Date  . arm fracture surgery     remotely  . cardica stent x2    . CLAVICLE SURGERY     remotely  . TONSILLECTOMY      Social History   Socioeconomic History  . Marital status: Widowed    Spouse name: Not on file  . Number of children: 0  . Years of education: Not on file  . Highest education level: Not on file  Occupational History  . Occupation: retired---[employer]    Employer: [employer]  . Financial  resource strain: Not on file  . Food insecurity:    Worry: Not on file    Inability: Not on file  . Transportation needs:    Medical: Not on file    Non-medical: Not on file  Tobacco Use  . Smoking status: Current Every Day Smoker    Packs/day: 1.00    Years: 40.00    Pack years: 40.00    Types: Cigars  . Smokeless tobacco: Never Used  Substance and Sexual Activity  . Alcohol use: Yes    Alcohol/week: 5.0 standard drinks    Types: 5 Standard drinks or equivalent per week    Comment: rare  . Drug use: No  . Sexual activity: Not Currently  Lifestyle  . Physical activity:    Days per week: Not on file    Minutes per session: Not on file  . Stress: Not on file  Relationships  . Social connections:    Talks on phone: Not on file    Gets together: Not on file    Attends religious service: Not on file    Active member of club or organization: Not on file    Attends meetings of clubs or organizations: Not on  file    Relationship status: Not on file  . Intimate partner violence:    Fear of current or ex partner: Not on file    Emotionally abused: Not on file    Physically abused: Not on file    Forced sexual activity: Not on file  Other Topics Concern  . Not on file  Social History Narrative   Lives w/ wife, he is the caregiver, she has muscle dysthrophy--lost wife ~10/2017   No children of his own, wife's daughter-Gk live w/ them          Allergies as of 04/18/2018      Reactions   Tussionex Pennkinetic Er [hydrocod Polst-cpm Polst Er] Hives   Albuterol Palpitations   Qvar [beclomethasone] Palpitations      Medication List        Accurate as of 04/18/18  9:27 AM. Always use your most recent med list.          ALPRAZolam 0.5 MG tablet Commonly known as:  XANAX TAKE 1/2-1 TABLET BY MOUTH 3 TIMES DAILYAS NEEDED FOR ANXIETY   atorvastatin 80 MG tablet Commonly known as:  LIPITOR Take 1 tablet (80 mg total) by mouth daily.   carisoprodol 350 MG  tablet Commonly known as:  SOMA Take 1 tablet (350 mg total) by mouth 3 (three) times daily as needed for muscle spasms.   ezetimibe 10 MG tablet Commonly known as:  ZETIA Take 1 tablet (10 mg total) by mouth daily.   glucose blood test strip Use as instructed   HYDROcodone-acetaminophen 5-325 MG tablet Commonly known as:  NORCO/VICODIN Take 1 tablet by mouth 3 (three) times daily as needed.   metoprolol succinate 50 MG 24 hr tablet Commonly known as:  TOPROL-XL TAKE 1 AND 1/2 TABLETS BY MOUTH ONCE DAILY WITH OR IMMEDIATELY FOLLOWING A MEAL   multivitamin tablet Take 1 tablet by mouth daily. Calcium, Magnesium, Zinc   omeprazole 20 MG capsule Commonly known as:  PRILOSEC Take 1 capsule (20 mg total) by mouth daily.   onetouch ultrasoft lancets Use as instructed   oxybutynin 10 MG 24 hr tablet Commonly known as:  DITROPAN-XL Take 10 mg by mouth at bedtime.   Vitamin D 50 MCG (2000 UT) Caps Take 1 capsule by mouth daily.   XARELTO 20 MG Tabs tablet Generic drug:  rivaroxaban TAKE ONE (1) TABLET BY MOUTH EVERY DAY WITH SUPPER          Objective:   Physical Exam BP 128/76 (BP Location: Left Arm, Patient Position: Sitting, Cuff Size: Small)   Pulse (!) 59   Temp 97.8 F (36.6 C) (Oral)   Resp 16   Ht 6\' 6"  (1.981 m)   Wt 277 lb (125.6 kg)   SpO2 93%   BMI 32.01 kg/m  General:   Well developed, NAD, BMI noted. HEENT:  Normocephalic . Face symmetric, atraumatic Lungs:  CTA B Normal respiratory effort, no intercostal retractions, no accessory muscle use. Heart:bradycaredic, seems regular,  no murmur.  No pretibial edema bilaterally  Skin: Not pale. Not jaundice Neurologic:  alert & oriented X3.  Speech normal, gait appropriate for age and unassisted Psych--  Cognition and judgment appear intact.  Cooperative with normal attention span and concentration.  Behavior appropriate. No anxious or depressed appearing.      Assessment & Plan:    Assessment Prediabetes Peripheral neuropathy (no pain, insensitive type) HTN Hyperlipidemia Anxiety depression -- on xanax  Chronic rhinitis- sees Dr Thornell Mule, ENT CV: Dr Stanford Breed --CAD.  Stress test okay 05-2015 -- Paroxysmal Atrial fibrillation --TIA --Abdominal bruit: No AAA. Ultrasound 05/2015 PULM:  OSA - + sleep study 03-2015 ,RX Cpap , not using it as off 01-2017  COPD: smoker, CT chest 12/2017 :changes noted   GU: BPH, Hypogonadism, ED. Sees urology B12 deficiency H/o pulmonary nodule, CXR 3-16 stable, no further schedule imaging Pain mngmt: has back-shoulder  pain and neuropathy  On Soma and Vicodin. Etodolac DC 2016  per Dr Larose Kells d/t concerns regards anticoagulation. Hydrocodone dose needed to be increased Has seen Dr. Erling Cruz several times since 2010:w/u  included SPE, ACE, TSH, B12 (slightly low with a normal methylmalonic acid) 08-2008 had a lumbar spine MRI with multilevel spondylosis Dr. 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  Recheck on RTC High cholesterol: Continue Lipitor, Zetia, last LDL well controlled Anxiety, depression: Grieving the loss of his wife, did group therapy and it helped significantly.  Feeling better.  Extensive listening therapy provided Pain management: Most of the pain the shoulders, symptoms decrease temporarily with local injections, after that he takes hydrocodone and Soma as needed DJD: As above Tobacco abuse: Ongoing, counseled COPD: New diagnosis per CT 12-2017.  No symptoms currently. RTC 3 to 4 months

## 2018-04-18 NOTE — Patient Instructions (Signed)
   GO TO THE FRONT DESK Schedule your next appointment for a  Routine check in 3-4 months

## 2018-04-18 NOTE — Progress Notes (Signed)
Pre visit review using our clinic review tool, if applicable. No additional management support is needed unless otherwise documented below in the visit note. 

## 2018-04-20 ENCOUNTER — Telehealth: Payer: Self-pay

## 2018-04-20 NOTE — Telephone Encounter (Signed)
Noted  

## 2018-04-20 NOTE — Telephone Encounter (Signed)
Copied from [place] [id number]. Topic: General - Other >> Apr 20, 2018  2:06 PM Bea Graff, NT wrote: Reason for CRM: Pt states [facility] will be sending a fax to receive authorization for his meds to begin being filled with them as on 06/08/2018. Pt gives permission. They will ask about these 4: ALPRAZolam (XANAX) 0.5 MG tablet, atorvastatin (LIPITOR) 80 MG tablet, metoprolol succinate (TOPROL-XL) 50 MG 24 hr tablet and ezetimibe (ZETIA) 10 MG tablet.

## 2018-04-28 ENCOUNTER — Other Ambulatory Visit: Payer: Self-pay

## 2018-05-04 ENCOUNTER — Other Ambulatory Visit: Payer: Self-pay

## 2018-05-04 DIAGNOSIS — E785 Hyperlipidemia, unspecified: Secondary | ICD-10-CM

## 2018-05-04 DIAGNOSIS — I10 Essential (primary) hypertension: Secondary | ICD-10-CM

## 2018-05-04 DIAGNOSIS — F341 Dysthymic disorder: Secondary | ICD-10-CM

## 2018-05-04 DIAGNOSIS — R739 Hyperglycemia, unspecified: Secondary | ICD-10-CM

## 2018-05-04 MED ORDER — ATORVASTATIN CALCIUM 80 MG PO TABS: 80.0000 mg | ORAL_TABLET | Freq: Every day | ORAL | 1 refills | Status: DC

## 2018-05-04 MED ORDER — EZETIMIBE 10 MG PO TABS: 10.0000 mg | ORAL_TABLET | Freq: Every day | ORAL | 3 refills | Status: DC

## 2018-05-04 MED ORDER — ALPRAZOLAM 0.5 MG PO TABS: ORAL_TABLET | ORAL | 0 refills | Status: DC

## 2018-05-04 MED ORDER — METOPROLOL SUCCINATE ER 50 MG PO TB24: ORAL_TABLET | ORAL | 1 refills | Status: DC

## 2018-05-04 NOTE — Telephone Encounter (Signed)
Received fax from [place] asking for refills of atorvastatin, ezetimibe, metoprolol, and alprazolam as that is pt's now preferred pharmacy. Author called [facility] to cancel all above refills left on medications requested. Routed to Dr. Larose Kells to approve alprazolam refill, order pended.

## 2018-05-04 NOTE — Telephone Encounter (Signed)
Xanax sent

## 2018-05-06 ENCOUNTER — Telehealth: Payer: Self-pay | Admitting: Internal Medicine

## 2018-05-09 ENCOUNTER — Telehealth: Payer: Self-pay

## 2018-05-09 NOTE — Telephone Encounter (Signed)
PA approved. Effective 02/08/2018 to 05/09/2019.

## 2018-05-09 NOTE — Telephone Encounter (Signed)
PA initiated via Covermymeds; KEY: [id number]. Awaiting determination.

## 2018-05-09 NOTE — Telephone Encounter (Signed)
Sent!

## 2018-05-09 NOTE — Telephone Encounter (Signed)
Pt is requesting refill on Soma.   Last OV: 04/18/2018 Last Fill: 02/11/2017 #90 and 5RF Pt sig: 1 tab tid prn UDS: 07/12/2017 Low risk  NCCR in media from 03/02/2018

## 2018-05-20 DIAGNOSIS — N3281 Overactive bladder: Secondary | ICD-10-CM | POA: Diagnosis not present

## 2018-05-20 DIAGNOSIS — N401 Enlarged prostate with lower urinary tract symptoms: Secondary | ICD-10-CM | POA: Diagnosis not present

## 2018-06-14 ENCOUNTER — Encounter: Payer: Self-pay | Admitting: Internal Medicine

## 2018-06-22 DIAGNOSIS — M19011 Primary osteoarthritis, right shoulder: Secondary | ICD-10-CM | POA: Diagnosis not present

## 2018-06-22 DIAGNOSIS — R52 Pain, unspecified: Secondary | ICD-10-CM | POA: Diagnosis not present

## 2018-07-14 ENCOUNTER — Encounter: Payer: Self-pay | Admitting: Internal Medicine

## 2018-07-18 ENCOUNTER — Other Ambulatory Visit: Payer: Self-pay | Admitting: Internal Medicine

## 2018-07-18 MED ORDER — DICLOFENAC SODIUM 1 % TD GEL: 4.0000 g | Freq: Four times a day (QID) | TRANSDERMAL | 3 refills | Status: DC | PRN

## 2018-07-20 MED ORDER — DICLOFENAC SODIUM 1 % TD GEL: 4.0000 g | Freq: Four times a day (QID) | TRANSDERMAL | 3 refills | Status: DC | PRN

## 2018-07-20 NOTE — Addendum Note (Signed)
Addended byDamita Dunnings D on: 07/20/2018 12:10 PM   Modules accepted: Orders

## 2018-07-22 ENCOUNTER — Other Ambulatory Visit: Payer: Self-pay | Admitting: Internal Medicine

## 2018-08-10 ENCOUNTER — Other Ambulatory Visit: Payer: Self-pay | Admitting: Internal Medicine

## 2018-08-10 DIAGNOSIS — R739 Hyperglycemia, unspecified: Secondary | ICD-10-CM

## 2018-08-10 MED ORDER — RIVAROXABAN 20 MG PO TABS: ORAL_TABLET | ORAL | 0 refills | Status: DC

## 2018-08-10 MED ORDER — EZETIMIBE 10 MG PO TABS: 10.0000 mg | ORAL_TABLET | Freq: Every day | ORAL | 0 refills | Status: DC

## 2018-08-10 NOTE — Telephone Encounter (Signed)
Copied from [place] [id number]. Topic: Quick Communication - Rx Refill/Question >> Aug 10, 2018 12:04 PM Margot Ables wrote: Medication: XARELTO 20 MG TABS tablet & ezetimibe (ZETIA) 10 MG tablet  - 2-3 wks left - pt has recently changed to [facility] mail order and they need new [place] notes having made several attempts to obtain RXs and no response - advised to allow 14-21 days for initial request - 90 day supply requested  Has the patient contacted their pharmacy? Yes - new RX needed/change in pharmacy Preferred Pharmacy (with phone number or street name): [place], [place] [phone] (Phone) [phone] (Fax)

## 2018-08-16 ENCOUNTER — Encounter: Payer: Self-pay | Admitting: Internal Medicine

## 2018-08-17 ENCOUNTER — Encounter: Payer: Self-pay | Admitting: Internal Medicine

## 2018-08-17 ENCOUNTER — Other Ambulatory Visit: Payer: Self-pay

## 2018-08-17 ENCOUNTER — Ambulatory Visit (INDEPENDENT_AMBULATORY_CARE_PROVIDER_SITE_OTHER): Payer: Medicare HMO | Admitting: Internal Medicine

## 2018-08-17 VITALS — BP 136/88 | HR 65 | Temp 98.0°F | Resp 16 | Ht 78.0 in | Wt 299.0 lb

## 2018-08-17 DIAGNOSIS — I10 Essential (primary) hypertension: Secondary | ICD-10-CM

## 2018-08-17 DIAGNOSIS — R739 Hyperglycemia, unspecified: Secondary | ICD-10-CM

## 2018-08-17 DIAGNOSIS — F4321 Adjustment disorder with depressed mood: Secondary | ICD-10-CM

## 2018-08-17 DIAGNOSIS — M545 Low back pain, unspecified: Secondary | ICD-10-CM

## 2018-08-17 DIAGNOSIS — E785 Hyperlipidemia, unspecified: Secondary | ICD-10-CM

## 2018-08-17 DIAGNOSIS — F341 Dysthymic disorder: Secondary | ICD-10-CM | POA: Diagnosis not present

## 2018-08-17 DIAGNOSIS — Z79899 Other long term (current) drug therapy: Secondary | ICD-10-CM | POA: Diagnosis not present

## 2018-08-17 DIAGNOSIS — G8929 Other chronic pain: Secondary | ICD-10-CM

## 2018-08-17 LAB — AST: AST: 19 U/L (ref 0–37)

## 2018-08-17 LAB — BASIC METABOLIC PANEL
BUN: 21 mg/dL (ref 6–23)
CO2: 29 mEq/L (ref 19–32)
Calcium: 9.9 mg/dL (ref 8.4–10.5)
Chloride: 102 mEq/L (ref 96–112)
Creatinine, Ser: 0.85 mg/dL (ref 0.40–1.50)
GFR: 89.24 mL/min (ref >60.00)
Glucose, Bld: 135 mg/dL — ABNORMAL HIGH (ref 70–99)
Potassium: 4.4 mEq/L (ref 3.5–5.1)
Sodium: 139 mEq/L (ref 135–145)

## 2018-08-17 LAB — ALT: ALT: 23 U/L (ref 0–53)

## 2018-08-17 LAB — TSH: TSH: 1.44 u[IU]/mL (ref 0.35–4.50)

## 2018-08-17 LAB — HEMOGLOBIN A1C: Hgb A1c MFr Bld: 6.7 % — ABNORMAL HIGH (ref 4.6–6.5)

## 2018-08-17 MED ORDER — FLUOXETINE HCL 20 MG PO TABS: ORAL_TABLET | ORAL | 3 refills | Status: DC

## 2018-08-17 NOTE — Patient Instructions (Addendum)
Please schedule Medicare Wellness with Glenard Haring.    GO TO THE LAB : Get the blood work     GO TO THE FRONT DESK Schedule your next appointment   for a checkup 4 weeks from today  Start fluoxetine 20 mg tablet: Half tablet in the morning daily for 1 week Then 1 tablet daily in the morning until you see me next  Please consider some counseling

## 2018-08-17 NOTE — Assessment & Plan Note (Signed)
DM: Last A1c was 6.8, he is gaining weight.  Check A1c.  Consider medication Significant weight gain: Check a TSH HTN: Seems controlled at the present time, continue with Toprol, checking a BMP Hyperlipidemia: Well-controlled on Lipitor, check LFTs Anxiety and depression: Lost his wife approximately 10 months ago, at the last visit he was doing okay but today he reports that he is depressed, PHQ 911.  He reports lack of motivation, feeling lonely, no desire to socialize.  No suicidal ideas. I encouraged him to get personal counseling & continue with group therapy.  We talk about medication, at this point I think ne needs pharmacological therapy.  Will start fluoxetine.  See instructions.  He rarely takes Xanax, will get a UDS and contract. DJD, pain management: Has Soma and  hydrocodone and use it rarely. He has ongoing shoulder pain, was offered surgery but eventually decided against it, pain is mostly at night, recommend to take hydrocodone at bedtime to help with nocturnal shoulder pain and promote better sleep.  UDS and contract today. He has developed knee pain lately, encouraged to see Ortho. Parking permit sign Patient wonders if CBD oil would be good option, I'm  not opposed. Social: About 3 months ago, he lost his wife of 30 years.  Counseling to the best of my ability.  See above. RTC 4 weeks

## 2018-08-17 NOTE — Progress Notes (Signed)
Pre visit review using our clinic review tool, if applicable. No additional management support is needed unless otherwise documented below in the visit note. 

## 2018-08-17 NOTE — Progress Notes (Signed)
Subjective:    Patient ID: Brendan [name], male    DOB: January 31, 1951, 68 y.o.   MRN: [id number]  DOS:  08/17/2018 Type of visit - description: rov Multiple issues to discuss Since he lost his wife, he is more depressed, decrease self motivation, not socializing, feeling lonely. Has gained weight, admits to very poor eating. Knee pain bilaterally exacerbated for the last 4 weeks.  Denies the knees been swollen or red, "they just hurt". Taking Soma and hydrocodone very infrequently. Ongoing shoulder pain, he eventually decided against surgery Has tried marijuana at one time and that makes him feel sleepy and he "forgets about" the pain.  Wt Readings from Last 3 Encounters:  08/17/18 299 lb (135.6 kg)  04/18/18 277 lb (125.6 kg)  12/14/17 278 lb 8 oz (126.3 kg)   Review of Systems Denies chest pain, difficulty breathing or lower extremity edema. No DOE No suicidal ideas No dizziness  Past Medical History:  Diagnosis Date  . Allergy   . Anxiety and depression   . Arthritis   . Atrial flutter [facility])    New diagnosis November 10, 2010, rate controlled  . B12 deficiency   . Blurred vision    episode with confusion, evaluated Dr. Erling Cruz  . BPH (benign prostatic hypertrophy)   . CAD (coronary artery disease)    stent, 2001 / nuclear, June, 2010, no ischemia  . Diverticulosis of colon   . Dyslipidemia   . ED (erectile dysfunction)   . Ejection fraction   . GERD (gastroesophageal reflux disease)    pt denies, take omeprazole d/t taking several medications not GERD per pt  . History of colonic polyps   . HTN (hypertension)   . Lumbar back pain   . Obesity   . OSA (obstructive sleep apnea)   . PAF (paroxysmal atrial fibrillation) ([place])   . Peripheral neuropathy    lower extremities  . Prediabetes   . Pulmonary nodule   . Scalp lesion    November, 2011  . Sinus bradycardia   . Stroke ([place])    tia  . Tobacco use    hx of  . Vitamin D deficiency     Past Surgical History:   Procedure Laterality Date  . arm fracture surgery     remotely  . cardica stent x2    . CLAVICLE SURGERY     remotely  . TONSILLECTOMY      Social History   Socioeconomic History  . Marital status: Widowed    Spouse name: Not on file  . Number of children: 0  . Years of education: Not on file  . Highest education level: Not on file  Occupational History  . Occupation: retired---[employer]    Employer: [employer]  . Financial resource strain: Not on file  . Food insecurity:    Worry: Not on file    Inability: Not on file  . Transportation needs:    Medical: Not on file    Non-medical: Not on file  Tobacco Use  . Smoking status: Current Every Day Smoker    Packs/day: 1.00    Years: 40.00    Pack years: 40.00    Types: Cigars  . Smokeless tobacco: Never Used  Substance and Sexual Activity  . Alcohol use: Yes    Alcohol/week: 5.0 standard drinks    Types: 5 Standard drinks or equivalent per week    Comment: rare  . Drug use: No  . Sexual activity: Not  Currently  Lifestyle  . Physical activity:    Days per week: Not on file    Minutes per session: Not on file  . Stress: Not on file  Relationships  . Social connections:    Talks on phone: Not on file    Gets together: Not on file    Attends religious service: Not on file    Active member of club or organization: Not on file    Attends meetings of clubs or organizations: Not on file    Relationship status: Not on file  . Intimate partner violence:    Fear of current or ex partner: Not on file    Emotionally abused: Not on file    Physically abused: Not on file    Forced sexual activity: Not on file  Other Topics Concern  . Not on file  Social History Narrative   Wife had muscle dystrophy, lost wife ~10/2017   Now lives in a smaller house, by himself       Allergies as of 08/17/2018      Reactions   Tussionex Pennkinetic Er [hydrocod Polst-cpm Polst Er] Hives   Albuterol  Palpitations   Qvar [beclomethasone] Palpitations      Medication List       Accurate as of August 17, 2018  9:56 AM. Always use your most recent med list.        ALPRAZolam 0.5 MG tablet Commonly known as:  XANAX TAKE 1/2-1 TABLET BY MOUTH 3 TIMES DAILYAS NEEDED FOR ANXIETY   atorvastatin 80 MG tablet Commonly known as:  LIPITOR Take 1 tablet (80 mg total) by mouth daily.   carisoprodol 350 MG tablet Commonly known as:  SOMA TAKE ONE TABLET BY MOUTH THREE TIMES A DAY AS NEEDED FOR MUSCLE SPASMS   diclofenac sodium 1 % Gel Commonly known as:  VOLTAREN Apply 4 g topically 4 (four) times daily as needed.   ezetimibe 10 MG tablet Commonly known as:  Zetia Take 1 tablet (10 mg total) by mouth daily.   glucose blood test strip Use as instructed   HYDROcodone-acetaminophen 5-325 MG tablet Commonly known as:  NORCO/VICODIN Take 1 tablet by mouth 3 (three) times daily as needed.   metoprolol succinate 50 MG 24 hr tablet Commonly known as:  TOPROL-XL TAKE 1 AND 1/2 TABLETS BY MOUTH ONCE DAILY WITH OR IMMEDIATELY FOLLOWING A MEAL   multivitamin tablet Take 1 tablet by mouth daily. Calcium, Magnesium, Zinc   omeprazole 20 MG capsule Commonly known as:  PRILOSEC Take 1 capsule (20 mg total) by mouth daily.   onetouch ultrasoft lancets Use as instructed   oxybutynin 10 MG 24 hr tablet Commonly known as:  DITROPAN-XL Take 10 mg by mouth at bedtime.   rivaroxaban 20 MG Tabs tablet Commonly known as:  Xarelto TAKE ONE (1) TABLET BY MOUTH EVERY DAY WITH SUPPER   sildenafil 100 MG tablet Commonly known as:  VIAGRA Take 100 mg by mouth daily as needed for erectile dysfunction.   Vitamin D 50 MCG (2000 UT) Caps Take 1 capsule by mouth daily.           Objective:   Physical Exam BP 136/88 (BP Location: Left Arm, Patient Position: Sitting, Cuff Size: Normal)   Pulse 65   Temp 98 F (36.7 C) (Oral)   Resp 16   Ht 6\' 6"  (1.981 m)   Wt 299 lb (135.6 kg)   SpO2  95%   BMI 34.55 kg/m  General:   Well  developed, NAD, BMI noted.  HEENT:  Normocephalic . Face symmetric, atraumatic Neck: No JVD Lungs:  CTA B Normal respiratory effort, no intercostal retractions, no accessory muscle use. Heart: RRR,  no murmur.  no pretibial edema bilaterally  Abdomen:  Not distended, soft, non-tender. No rebound or rigidity.   Skin: Not pale. Not jaundice Neurologic:  alert & oriented X3.  Speech normal, gait appropriate for age and unassisted Psych--  Cognition and judgment appear intact.  Cooperative with normal attention span and concentration.  Behavior appropriate. No anxious or depressed appearing.     Assessment     Assessment DM (A1C increased to 6.8 on 12/2017) Peripheral neuropathy (no pain, insensitive type) HTN Hyperlipidemia Anxiety depression -- on xanax  Chronic rhinitis- sees Dr Thornell Mule, ENT CV: Dr Stanford Breed --CAD. Stress test okay 05-2015 -- Paroxysmal Atrial fibrillation --TIA --Abdominal bruit: No AAA. Ultrasound 05/2015 PULM:  OSA - + sleep study 03-2015 ,RX Cpap , not using it as off 01-2017  COPD: smoker, CT chest 12/2017 :changes noted   GU: BPH, Hypogonadism, ED. Sees urology B12 deficiency H/o pulmonary nodule, CXR 3-16 stable, no further schedule imaging Pain mngmt: has back-shoulder  pain and neuropathy  On Soma and Vicodin. Etodolac DC 2016  per Dr Larose Kells d/t concerns regards anticoagulation. Hydrocodone dose needed to be increased Has seen Dr. Erling Cruz several times since 2010:w/u  included SPE, ACE, TSH, B12 (slightly low with a normal methylmalonic acid) 08-2008 had a lumbar spine MRI with multilevel spondylosis Dr. 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  Check A1c.  Consider medication Significant weight gain: Check a TSH HTN: Seems  controlled at the present time, continue with Toprol, checking a BMP Hyperlipidemia: Well-controlled on Lipitor, check LFTs Anxiety and depression: Lost his wife approximately 10 months ago, at the last visit he was doing okay but today he reports that he is depressed, PHQ 911.  He reports lack of motivation, feeling lonely, no desire to socialize.  No suicidal ideas. I encouraged him to get personal counseling & continue with group therapy.  We talk about medication, at this point I think ne needs pharmacological therapy.  Will start fluoxetine.  See instructions.  He rarely takes Xanax, will get a UDS and contract. DJD, pain management: Has Soma and  hydrocodone and use it rarely. He has ongoing shoulder pain, was offered surgery but eventually decided against it, pain is mostly at night, recommend to take hydrocodone at bedtime to help with nocturnal shoulder pain and promote better sleep.  UDS and contract today. He has developed knee pain lately, encouraged to see Ortho. Parking permit sign Patient wonders if CBD oil would be good option, I'm  not opposed. Social: About 3 months ago, he lost his wife of 30 years.  Counseling to the best of my ability.  See above. RTC 4 weeks   Today, I spent more than 32   min with the patient: >50% of the time counseling regards his multiple chronic medical issues, ordering blood tests, also counseling and listening about his anxiety, depression.

## 2018-08-23 LAB — PAIN MGMT, PROFILE 8 W/CONF, U
6 Acetylmorphine: NEGATIVE ng/mL (ref <10)
Alcohol Metabolites: NEGATIVE ng/mL (ref <500)
Alphahydroxyalprazolam: 197 ng/mL — ABNORMAL HIGH (ref <25)
Alphahydroxymidazolam: NEGATIVE ng/mL (ref <50)
Alphahydroxytriazolam: NEGATIVE ng/mL (ref <50)
Aminoclonazepam: NEGATIVE ng/mL (ref <25)
Amphetamines: NEGATIVE ng/mL (ref <500)
Benzodiazepines: POSITIVE ng/mL — AB (ref <100)
Buprenorphine, Urine: NEGATIVE ng/mL (ref <5)
Cocaine Metabolite: NEGATIVE ng/mL (ref <150)
Codeine: 53 ng/mL — ABNORMAL HIGH (ref <50)
Creatinine: 117.1 mg/dL
Hydrocodone: NEGATIVE ng/mL (ref <50)
Hydromorphone: NEGATIVE ng/mL (ref <50)
Hydroxyethylflurazepam: NEGATIVE ng/mL (ref <50)
Lorazepam: NEGATIVE ng/mL (ref <50)
MDMA: NEGATIVE ng/mL (ref <500)
MORPHINE: NEGATIVE ng/mL (ref <50)
Marijuana Metabolite: NEGATIVE ng/mL (ref <20)
Nordiazepam: NEGATIVE ng/mL (ref <50)
Norhydrocodone: NEGATIVE ng/mL (ref <50)
Opiates: POSITIVE ng/mL — AB (ref <100)
Oxazepam: NEGATIVE ng/mL (ref <50)
Oxidant: NEGATIVE ug/mL (ref <200)
Oxycodone: NEGATIVE ng/mL (ref <100)
Temazepam: NEGATIVE ng/mL (ref <50)
pH: 6.27 (ref 4.5–9.0)

## 2018-08-27 ENCOUNTER — Encounter: Payer: Self-pay | Admitting: Internal Medicine

## 2018-08-29 ENCOUNTER — Telehealth: Payer: Self-pay

## 2018-08-29 MED ORDER — FLUOXETINE HCL 10 MG PO CAPS: ORAL_CAPSULE | ORAL | 3 refills | Status: DC

## 2018-08-29 NOTE — Telephone Encounter (Signed)
Fluoxetine 10 mg capsules: 1 capsule daily for 1 week, then 2capsules daily.  #60, 3 refills. If the 10 mg capsules  are not available, we will simply have to go straight to fluoxetine 20 mg tablets 1 daily.

## 2018-08-29 NOTE — Telephone Encounter (Signed)
Rx sent. MyChart message sent to Pt to make him aware.

## 2018-08-29 NOTE — Telephone Encounter (Signed)
Fluoxetine tablets not covered by plan. Fluoxetine capsules are as well as citalopram tabs, setraline tabs, escitalopram tabs.

## 2018-08-31 ENCOUNTER — Telehealth: Payer: Self-pay | Admitting: Internal Medicine

## 2018-08-31 NOTE — Telephone Encounter (Signed)
Noted, he is moderate risk.  Will  send #90, no refills

## 2018-08-31 NOTE — Telephone Encounter (Signed)
Pt is requesting refill on hydrocodone.   Last OV: 08/17/2018  Last Fill: 11/02/2017 #90 and 0RF UDS: 08/17/2018 Moderate risk

## 2018-09-08 ENCOUNTER — Encounter: Payer: Self-pay | Admitting: Internal Medicine

## 2018-09-19 NOTE — Progress Notes (Signed)
Virtual Visit via Video Note  I connected with Brendan [name] on 09/20/18 at  2:00 PM EDT by a video enabled telemedicine application and verified that I am speaking with the correct person using two identifiers.   THIS ENCOUNTER IS A VIRTUAL VISIT DUE TO COVID-19 - PATIENT WAS NOT SEEN IN THE OFFICE. PATIENT HAS CONSENTED TO VIRTUAL VISIT / TELEMEDICINE VISIT   Location of patient: home  Location of provider: office  I discussed the limitations of evaluation and management by telemedicine and the availability of in person appointments. The patient expressed understanding and agreed to proceed.   Subjective:   Brendan [name] is a 68 y.o. male who presents for Medicare Annual/Subsequent preventive examination.  Review of Systems: No ROS.  Medicare Wellness Visit. Additional risk factors are reflected in the social history.  Cardiac Risk Factors include: advanced age (>76men, >68 women);dyslipidemia;male gender;hypertension;smoking/ tobacco exposure;obesity (BMI >30kg/m2) Sleep patterns: Takes pain meds at bedtime for shoulder pain. Home Safety/Smoke Alarms: Feels safe in home. Smoke alarms in place.  Lives in 1 story home with dog. Pt was caregiver for his wife. She had muscular dystrophy. Lost wife 10/2017  Male:   CCS-  Last 07/26/14. 10 yr recall   PSA-  Lab Results  Component Value Date   PSA 0.51 12/28/2014   PSA 0.42 06/22/2012   PSA 0.51 10/09/2011       Objective:    Vitals: Unable to assess. This visit is enabled though telemedicine due to Covid 19.   Advanced Directives 09/20/2018 05/25/2017 09/13/2016 05/15/2016 04/07/2015 03/11/2015  Does Patient Have a Medical Advance Directive? Yes Yes Yes Yes Yes Yes  Type of Paramedic of [place];Living will [place];Living will [place];Living will Out of facility DNR (pink MOST or yellow form) - [place];Living will  Does patient want to make  changes to medical advance directive? No - Patient declined - - No - Patient declined - No - Patient declined  Copy of [place] in Chart? No - copy requested Yes No - copy requested - - No - copy requested    Tobacco Social History   Tobacco Use  Smoking Status Current Every Day Smoker  . Packs/day: 0.50  . Years: 40.00  . Pack years: 20.00  . Types: Cigars  Smokeless Tobacco Never Used     Ready to quit: No Counseling given: No   Clinical Intake: Pain : No/denies pain    Past Medical History:  Diagnosis Date  . Allergy   . Anxiety and depression   . Arthritis   . Atrial flutter [facility])    New diagnosis November 10, 2010, rate controlled  . B12 deficiency   . Blurred vision    episode with confusion, evaluated Dr. Erling Cruz  . BPH (benign prostatic hypertrophy)   . CAD (coronary artery disease)    stent, 2001 / nuclear, June, 2010, no ischemia  . Diverticulosis of colon   . Dyslipidemia   . ED (erectile dysfunction)   . Ejection fraction   . GERD (gastroesophageal reflux disease)    pt denies, take omeprazole d/t taking several medications not GERD per pt  . History of colonic polyps   . HTN (hypertension)   . Lumbar back pain   . Obesity   . OSA (obstructive sleep apnea)   . PAF (paroxysmal atrial fibrillation) ([place])   . Peripheral neuropathy    lower extremities  . Prediabetes   . Pulmonary  nodule   . Scalp lesion    November, 2011  . Sinus bradycardia   . Stroke ([place])    tia  . Tobacco use    hx of  . Vitamin D deficiency    Past Surgical History:  Procedure Laterality Date  . arm fracture surgery     remotely  . cardica stent x2    . CLAVICLE SURGERY     remotely  . TONSILLECTOMY     Family History  Problem Relation Age of Onset  . Stroke Mother   . Alzheimer's disease Mother        Jerilynn Mages, brother   . Heart failure Father   . Stroke Father   . Alzheimer's disease Paternal Aunt   . Colon cancer Neg Hx   . Esophageal cancer Neg Hx    . Rectal cancer Neg Hx   . Stomach cancer Neg Hx   . Prostate cancer Neg Hx    Social History   Socioeconomic History  . Marital status: Widowed    Spouse name: Not on file  . Number of children: 0  . Years of education: Not on file  . Highest education level: Not on file  Occupational History  . Occupation: retired---[employer]    Employer: [employer]  . Financial resource strain: Not on file  . Food insecurity:    Worry: Not on file    Inability: Not on file  . Transportation needs:    Medical: Not on file    Non-medical: Not on file  Tobacco Use  . Smoking status: Current Every Day Smoker    Packs/day: 0.50    Years: 40.00    Pack years: 20.00    Types: Cigars  . Smokeless tobacco: Never Used  Substance and Sexual Activity  . Alcohol use: Yes    Alcohol/week: 5.0 standard drinks    Types: 5 Standard drinks or equivalent per week    Comment: rare  . Drug use: No  . Sexual activity: Not Currently  Lifestyle  . Physical activity:    Days per week: Not on file    Minutes per session: Not on file  . Stress: Not on file  Relationships  . Social connections:    Talks on phone: Not on file    Gets together: Not on file    Attends religious service: Not on file    Active member of club or organization: Not on file    Attends meetings of clubs or organizations: Not on file    Relationship status: Not on file  Other Topics Concern  . Not on file  Social History Narrative   Wife had muscle dystrophy, lost wife ~10/2017   Now lives in a smaller house, by himself     Outpatient Encounter Medications as of 09/20/2018  Medication Sig  . ALPRAZolam (XANAX) 0.5 MG tablet TAKE 1/2-1 TABLET BY MOUTH 3 TIMES DAILYAS NEEDED FOR ANXIETY  . atorvastatin (LIPITOR) 80 MG tablet Take 1 tablet (80 mg total) by mouth daily.  . carisoprodol (SOMA) 350 MG tablet TAKE ONE TABLET BY MOUTH THREE TIMES A DAY AS NEEDED FOR MUSCLE SPASMS  . Cholecalciferol  (VITAMIN D) 2000 UNITS CAPS Take 1 capsule by mouth daily.  . diclofenac sodium (VOLTAREN) 1 % GEL Apply 4 g topically 4 (four) times daily as needed.  . ezetimibe (ZETIA) 10 MG tablet Take 1 tablet (10 mg total) by mouth daily.  Marland Kitchen FLUoxetine (PROZAC) 20 MG capsule Take  1 capsule (20 mg total) by mouth daily.  Marland Kitchen glucose blood test strip Use as instructed  . HYDROcodone-acetaminophen (NORCO/VICODIN) 5-325 MG tablet TAKE ONE (1) TABLET BY MOUTH THREE (3) TIMES EACH DAY BY MOUTH AS NEEDED  . metoprolol succinate (TOPROL-XL) 50 MG 24 hr tablet TAKE 1 AND 1/2 TABLETS BY MOUTH ONCE DAILY WITH OR IMMEDIATELY FOLLOWING A MEAL  . Multiple Vitamin (MULTIVITAMIN) tablet Take 1 tablet by mouth daily. Calcium, Magnesium, Zinc  . omeprazole (PRILOSEC) 20 MG capsule Take 1 capsule (20 mg total) by mouth daily.  Marland Kitchen oxybutynin (DITROPAN-XL) 10 MG 24 hr tablet Take 10 mg by mouth at bedtime.  . rivaroxaban (XARELTO) 20 MG TABS tablet TAKE ONE (1) TABLET BY MOUTH EVERY DAY WITH SUPPER  . Lancets (ONETOUCH ULTRASOFT) lancets Use as instructed (Patient not taking: Reported on 09/20/2018)  . sildenafil (VIAGRA) 100 MG tablet Take 100 mg by mouth daily as needed for erectile dysfunction.  . [DISCONTINUED] FLUoxetine (PROZAC) 10 MG capsule Take 1 capsule by mouth daily for 1 week, then increase to 2 capsules daily   No facility-administered encounter medications on file as of 09/20/2018.     Activities of Daily Living In your present state of health, do you have any difficulty performing the following activities: 09/20/2018 08/17/2018  Hearing? N N  Vision? N N  Difficulty concentrating or making decisions? N N  Walking or climbing stairs? N N  Dressing or bathing? N N  Doing errands, shopping? N N  Preparing Food and eating ? N -  Using the Toilet? N -  In the past six months, have you accidently leaked urine? N -  Do you have problems with loss of bowel control? N -  Managing your Medications? N -  Managing your  Finances? N -  Housekeeping or managing your Housekeeping? N -  Some recent data might be hidden    Patient Care Team: Colon Branch, MD as PCP - General (Internal Medicine) Rigoberto Noel, MD as Consulting Physician (Pulmonary Disease) Franchot Gallo, MD as Consulting Physician (Urology) Ladene Artist, MD as Consulting Physician (Gastroenterology) Lelon Perla, MD as Consulting Physician (Cardiology)   Assessment:   This is a routine wellness examination for Brendan [name]. Physical assessment deferred to PCP.  Exercise Activities and Dietary recommendations Current Exercise Habits: Home exercise routine, Type of exercise: walking(walks pit/terrier dog 3x/ day), Time (Minutes): 30, Frequency (Times/Week): 7, Weekly Exercise (Minutes/Week): 210, Intensity: Mild, Exercise limited by: None identified   Diet (meal preparation, eat out, water intake, caffeinated beverages, dairy products, fruits and vegetables): well balanced, on average, 3 meals per day   Goals    . Weight (lb) < 250 lb (113.4 kg)     pt reports his weight as 288 lb this morning.   Fall Risk Fall Risk  04/28/2018 05/25/2017 05/15/2016 03/02/2016 07/11/2015  Falls in the past year? 0 No No No No  Comment Emmi Telephone Survey: data to providers prior to load - - - -    Depression Screen PHQ 2/9 Scores 08/17/2018 05/25/2017 05/15/2016 03/02/2016  PHQ - 2 Score 4 1 2 2   PHQ- 9 Score 15 - 4 8    Cognitive Function Ad8 score reviewed for issues:  Issues making decisions:no  Less interest in hobbies / activities:no  Repeats questions, stories (family complaining):no  Trouble using ordinary gadgets (microwave, computer, phone):no  Forgets the month or year: no  Mismanaging finances: no  Remembering appts:no  Daily problems with thinking and/or memory:no  Ad8 score is=0     MMSE - Mini Mental State Exam 05/25/2017 05/15/2016  Orientation to time 5 5  Orientation to Place 5 5  Registration 3 3   Attention/ Calculation 5 5  Recall 3 3  Language- name 2 objects 2 2  Language- repeat 1 1  Language- follow 3 step command 3 3  Language- read & follow direction 1 1  Write a sentence 1 1  Copy design 1 1  Total score 30 30        Immunization History  Administered Date(s) Administered  . Influenza Split 06/05/2012, 06/20/2013, 04/10/2014  . Influenza Whole 04/08/2009, 04/22/2010, 02/11/2011  . Influenza, High Dose Seasonal PF 03/02/2016  . Influenza-Unspecified 03/28/2015, 03/17/2017, 03/10/2018  . Pneumococcal Conjugate-13 04/27/2014  . Pneumococcal Polysaccharide-23 05/19/2010  . Td 05/19/2010  . Zoster 12/25/2013    Screening Tests Health Maintenance  Topic Date Due  . PNA vac Low Risk Adult (2 of 2 - PPSV23) 04/28/2019 (Originally 05/20/2015)  . INFLUENZA VACCINE  01/07/2019  . TETANUS/TDAP  05/19/2020  . COLONOSCOPY  07/26/2024  . Hepatitis C Screening  Completed       Plan:   See you next year!  Continue to eat heart healthy diet (full of fruits, vegetables, whole grains, lean protein, water--limit salt, fat, and sugar intake) and increase physical activity as tolerated.  Continue doing brain stimulating activities (puzzles, reading, adult coloring books, staying active) to keep memory sharp.   Bring a copy of your living will and/or healthcare power of attorney to your next office visit.   I have personally reviewed and noted the following in the patient's chart:   . Medical and social history . Use of alcohol, tobacco or illicit drugs  . Current medications and supplements . Functional ability and status . Nutritional status . Physical activity . Advanced directives . List of other physicians . Hospitalizations, surgeries, and ER visits in previous 12 months . Vitals . Screenings to include cognitive, depression, and falls . Referrals and appointments  In addition, I have reviewed and discussed with patient certain preventive protocols, quality  metrics, and best practice recommendations. A written personalized care plan for preventive services as well as general preventive health recommendations were provided to patient.     Naaman Plummer [place], [place]  09/20/2018  [name], MD

## 2018-09-20 ENCOUNTER — Ambulatory Visit (INDEPENDENT_AMBULATORY_CARE_PROVIDER_SITE_OTHER): Payer: Medicare HMO | Admitting: Internal Medicine

## 2018-09-20 ENCOUNTER — Encounter: Payer: Self-pay | Admitting: *Deleted

## 2018-09-20 ENCOUNTER — Ambulatory Visit (INDEPENDENT_AMBULATORY_CARE_PROVIDER_SITE_OTHER): Payer: Medicare HMO | Admitting: *Deleted

## 2018-09-20 ENCOUNTER — Encounter: Payer: Self-pay | Admitting: Internal Medicine

## 2018-09-20 ENCOUNTER — Other Ambulatory Visit: Payer: Self-pay

## 2018-09-20 DIAGNOSIS — M545 Low back pain: Secondary | ICD-10-CM | POA: Diagnosis not present

## 2018-09-20 DIAGNOSIS — Z0000 Encounter for general adult medical examination without abnormal findings: Secondary | ICD-10-CM | POA: Diagnosis not present

## 2018-09-20 DIAGNOSIS — G8929 Other chronic pain: Secondary | ICD-10-CM | POA: Diagnosis not present

## 2018-09-20 DIAGNOSIS — F341 Dysthymic disorder: Secondary | ICD-10-CM

## 2018-09-20 MED ORDER — FLUOXETINE HCL 20 MG PO CAPS: 20.0000 mg | ORAL_CAPSULE | Freq: Every day | ORAL | 1 refills | Status: DC

## 2018-09-20 NOTE — Progress Notes (Addendum)
Subjective:    Patient ID: Brendan [name], male    DOB: December 02, 1950, 68 y.o.   MRN: [id number]  DOS:  09/20/2018 Type of visit - description: Virtual Visit via Video Note  I connected with@ on 09/21/18 at  1:20 PM EDT by a video enabled telemedicine application and verified that I am speaking with the correct person using two identifiers.   THIS ENCOUNTER IS A VIRTUAL VISIT DUE TO COVID-19 - PATIENT WAS NOT SEEN IN THE OFFICE. PATIENT HAS CONSENTED TO VIRTUAL VISIT / TELEMEDICINE VISIT   Location of patient: home  Location of provider: office  I discussed the limitations of evaluation and management by telemedicine and the availability of in person appointments. The patient expressed understanding and agreed to proceed.  History of Present Illness: Follow-up visit See last visit, patient was a started on fluoxetine.  Good compliance, no apparent side effects, feeling much improved. Has not reach out for counseling and wonders if that is a still necessary    Review of Systems Denies suicidal ideas Decreased motivation has improved Chronic pain well controlled. Sleep has improved.   Past Medical History:  Diagnosis Date  . Allergy   . Anxiety and depression   . Arthritis   . Atrial flutter [facility])    New diagnosis November 10, 2010, rate controlled  . B12 deficiency   . Blurred vision    episode with confusion, evaluated Dr. Erling Cruz  . BPH (benign prostatic hypertrophy)   . CAD (coronary artery disease)    stent, 2001 / nuclear, June, 2010, no ischemia  . Diverticulosis of colon   . Dyslipidemia   . ED (erectile dysfunction)   . Ejection fraction   . GERD (gastroesophageal reflux disease)    pt denies, take omeprazole d/t taking several medications not GERD per pt  . History of colonic polyps   . HTN (hypertension)   . Lumbar back pain   . Obesity   . OSA (obstructive sleep apnea)   . PAF (paroxysmal atrial fibrillation) ([place])   . Peripheral neuropathy    lower  extremities  . Prediabetes   . Pulmonary nodule   . Scalp lesion    November, 2011  . Sinus bradycardia   . Stroke ([place])    tia  . Tobacco use    hx of  . Vitamin D deficiency     Past Surgical History:  Procedure Laterality Date  . arm fracture surgery     remotely  . cardica stent x2    . CLAVICLE SURGERY     remotely  . TONSILLECTOMY      Social History   Socioeconomic History  . Marital status: Widowed    Spouse name: Not on file  . Number of children: 0  . Years of education: Not on file  . Highest education level: Not on file  Occupational History  . Occupation: retired---[employer]    Employer: [employer]  . Financial resource strain: Not on file  . Food insecurity:    Worry: Not on file    Inability: Not on file  . Transportation needs:    Medical: Not on file    Non-medical: Not on file  Tobacco Use  . Smoking status: Current Every Day Smoker    Packs/day: 0.50    Years: 40.00    Pack years: 20.00    Types: Cigars  . Smokeless tobacco: Never Used  Substance and Sexual Activity  . Alcohol use: Yes  Alcohol/week: 5.0 standard drinks    Types: 5 Standard drinks or equivalent per week    Comment: rare  . Drug use: No  . Sexual activity: Not Currently  Lifestyle  . Physical activity:    Days per week: Not on file    Minutes per session: Not on file  . Stress: Not on file  Relationships  . Social connections:    Talks on phone: Not on file    Gets together: Not on file    Attends religious service: Not on file    Active member of club or organization: Not on file    Attends meetings of clubs or organizations: Not on file    Relationship status: Not on file  . Intimate partner violence:    Fear of current or ex partner: Not on file    Emotionally abused: Not on file    Physically abused: Not on file    Forced sexual activity: Not on file  Other Topics Concern  . Not on file  Social History Narrative   Wife had  muscle dystrophy, lost wife ~10/2017   Now lives in a smaller house, by himself       Allergies as of 09/20/2018      Reactions   Tussionex Pennkinetic Er [hydrocod Polst-cpm Polst Er] Hives   Albuterol Palpitations   Qvar [beclomethasone] Palpitations      Medication List       Accurate as of September 20, 2018 11:59 PM. Always use your most recent med list.        ALPRAZolam 0.5 MG tablet Commonly known as:  XANAX TAKE 1/2-1 TABLET BY MOUTH 3 TIMES DAILYAS NEEDED FOR ANXIETY   atorvastatin 80 MG tablet Commonly known as:  LIPITOR Take 1 tablet (80 mg total) by mouth daily.   carisoprodol 350 MG tablet Commonly known as:  SOMA TAKE ONE TABLET BY MOUTH THREE TIMES A DAY AS NEEDED FOR MUSCLE SPASMS   diclofenac sodium 1 % Gel Commonly known as:  VOLTAREN Apply 4 g topically 4 (four) times daily as needed.   ezetimibe 10 MG tablet Commonly known as:  Zetia Take 1 tablet (10 mg total) by mouth daily.   FLUoxetine 20 MG capsule Commonly known as:  PROZAC Take 1 capsule (20 mg total) by mouth daily.   glucose blood test strip Use as instructed   HYDROcodone-acetaminophen 5-325 MG tablet Commonly known as:  NORCO/VICODIN TAKE ONE (1) TABLET BY MOUTH THREE (3) TIMES EACH DAY BY MOUTH AS NEEDED   metoprolol succinate 50 MG 24 hr tablet Commonly known as:  TOPROL-XL TAKE 1 AND 1/2 TABLETS BY MOUTH ONCE DAILY WITH OR IMMEDIATELY FOLLOWING A MEAL   multivitamin tablet Take 1 tablet by mouth daily. Calcium, Magnesium, Zinc   omeprazole 20 MG capsule Commonly known as:  PRILOSEC Take 1 capsule (20 mg total) by mouth daily.   onetouch ultrasoft lancets Use as instructed   oxybutynin 10 MG 24 hr tablet Commonly known as:  DITROPAN-XL Take 10 mg by mouth at bedtime.   rivaroxaban 20 MG Tabs tablet Commonly known as:  Xarelto TAKE ONE (1) TABLET BY MOUTH EVERY DAY WITH SUPPER   sildenafil 100 MG tablet Commonly known as:  VIAGRA Take 100 mg by mouth daily as needed  for erectile dysfunction.   Vitamin D 50 MCG (2000 UT) Caps Take 1 capsule by mouth daily.           Objective:   Physical Exam There were no  vitals taken for this visit. This is a virtual video visit, he is alert and oriented x3, he seems to be in good spirits    Assessment     Assessment DM (A1C increased to 6.8 on 12/2017) Peripheral neuropathy (no pain, insensitive type) HTN Hyperlipidemia Anxiety depression -- on xanax  Chronic rhinitis- sees Dr Thornell Mule, ENT CV: Dr Stanford Breed --CAD. Stress test okay 05-2015 -- Paroxysmal Atrial fibrillation --TIA --Abdominal bruit: No AAA. Ultrasound 05/2015 PULM:  OSA - + sleep study 03-2015 ,RX Cpap , not using it as off 01-2017  COPD: smoker, CT chest 12/2017 :changes noted   GU: BPH, Hypogonadism, ED. Sees urology B12 deficiency H/o pulmonary nodule, CXR 3-16 stable, no further schedule imaging Pain mngmt: has back-shoulder  pain and neuropathy  On Soma and Vicodin. Etodolac DC 2016  per Dr Larose Kells d/t concerns regards anticoagulation. Hydrocodone dose needed to be increased Has seen Dr. Erling Cruz several times since 2010:w/u  included SPE, ACE, TSH, B12 (slightly low with a normal methylmalonic acid) 08-2008 had a lumbar spine MRI with multilevel spondylosis Dr. 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  He wonders if he still needs counseling : if it is within his means that will be very positive for him.  I encouraged him to pursue that. Plan: Refill fluoxetine 20 mg daily.  Follow-up in 3 months. DJD, pain management: Currently taking 1 hydrocodone along with a Soma at night, pain is controlled at this point and the quality of his sleep has improved. DM: Control it, see last  A1c.  He is more active, walking his dog, has lost some weight.  Praised it. RTC 3 months, patient will call in a couple of months to schedule.  Call sooner if needed.  I discussed the assessment and treatment plan with the patient. The patient was provided an opportunity to ask questions and all were answered. The patient agreed with the plan and demonstrated an understanding of the instructions.   The patient was advised to call back or seek an in-person evaluation if the symptoms worsen or if the condition fails to improve as anticipated.

## 2018-09-20 NOTE — Patient Instructions (Signed)
See you next year!  Continue to eat heart healthy diet (full of fruits, vegetables, whole grains, lean protein, water--limit salt, fat, and sugar intake) and increase physical activity as tolerated.  Continue doing brain stimulating activities (puzzles, reading, adult coloring books, staying active) to keep memory sharp.   Bring a copy of your living will and/or healthcare power of attorney to your next office visit.   Brendan [name] , Thank you for taking time to come for your Medicare Wellness Visit. I appreciate your ongoing commitment to your health goals. Please review the following plan we discussed and let me know if I can assist you in the future.   These are the goals we discussed: Goals    . Weight (lb) < 250 lb (113.4 kg)       This is a list of the screening recommended for you and due dates:  Health Maintenance  Topic Date Due  . Pneumonia vaccines (2 of 2 - PPSV23) 04/28/2019*  . Flu Shot  01/07/2019  . Tetanus Vaccine  05/19/2020  . Colon Cancer Screening  07/26/2024  .  Hepatitis C: One time screening is recommended by Center for Disease Control  (CDC) for  adults born from [birth year] through 1967.   Completed  *Topic was postponed. The date shown is not the original due date.    Health Maintenance After Age 32 After age 66, you are at a higher risk for certain long-term diseases and infections as well as injuries from falls. Falls are a major cause of broken bones and head injuries in people who are older than age 19. Getting regular preventive care can help to keep you healthy and well. Preventive care includes getting regular testing and making lifestyle changes as recommended by your health care provider. Talk with your health care provider about:  Which screenings and tests you should have. A screening is a test that checks for a disease when you have no symptoms.  A diet and exercise plan that is right for you. What should I know about screenings and tests to prevent  falls? Screening and testing are the best ways to find a health problem early. Early diagnosis and treatment give you the best chance of managing medical conditions that are common after age 30. Certain conditions and lifestyle choices may make you more likely to have a fall. Your health care provider may recommend:  Regular vision checks. Poor vision and conditions such as cataracts can make you more likely to have a fall. If you wear glasses, make sure to get your prescription updated if your vision changes.  Medicine review. Work with your health care provider to regularly review all of the medicines you are taking, including over-the-counter medicines. Ask your health care provider about any side effects that may make you more likely to have a fall. Tell your health care provider if any medicines that you take make you feel dizzy or sleepy.  Osteoporosis screening. Osteoporosis is a condition that causes the bones to get weaker. This can make the bones weak and cause them to break more easily.  Blood pressure screening. Blood pressure changes and medicines to control blood pressure can make you feel dizzy.  Strength and balance checks. Your health care provider may recommend certain tests to check your strength and balance while standing, walking, or changing positions.  Foot health exam. Foot pain and numbness, as well as not wearing proper footwear, can make you more likely to have a fall.  Depression screening.  You may be more likely to have a fall if you have a fear of falling, feel emotionally low, or feel unable to do activities that you used to do.  Alcohol use screening. Using too much alcohol can affect your balance and may make you more likely to have a fall. What actions can I take to lower my risk of falls? General instructions  Talk with your health care provider about your risks for falling. Tell your health care provider if: ? You fall. Be sure to tell your health care  provider about all falls, even ones that seem minor. ? You feel dizzy, sleepy, or off-balance.  Take over-the-counter and prescription medicines only as told by your health care provider. These include any supplements.  Eat a healthy diet and maintain a healthy weight. A healthy diet includes low-fat dairy products, low-fat (lean) meats, and fiber from whole grains, beans, and lots of fruits and vegetables. Home safety  Remove any tripping hazards, such as rugs, cords, and clutter.  Install safety equipment such as grab bars in bathrooms and safety rails on stairs.  Keep rooms and walkways well-lit. Activity   Follow a regular exercise program to stay fit. This will help you maintain your balance. Ask your health care provider what types of exercise are appropriate for you.  If you need a cane or walker, use it as recommended by your health care provider.  Wear supportive shoes that have nonskid soles. Lifestyle  Do not drink alcohol if your health care provider tells you not to drink.  If you drink alcohol, limit how much you have: ? 0-1 drink a day for women. ? 0-2 drinks a day for men.  Be aware of how much alcohol is in your drink. In the [place] one drink equals one typical bottle of beer (12 oz), one-half glass of wine (5 oz), or one shot of hard liquor (1 oz).  Do not use any products that contain nicotine or tobacco, such as cigarettes and e-cigarettes. If you need help quitting, ask your health care provider. Summary  Having a healthy lifestyle and getting preventive care can help to protect your health and wellness after age 65.  Screening and testing are the best way to find a health problem early and help you avoid having a fall. Early diagnosis and treatment give you the best chance for managing medical conditions that are more common for people who are older than age 31.  Falls are a major cause of broken bones and head injuries in people who are older than age 103.  Take precautions to prevent a fall at home.  Work with your health care provider to learn what changes you can make to improve your health and wellness and to prevent falls. This information is not intended to replace advice given to you by your health care provider. Make sure you discuss any questions you have with your health care provider. Document Released: 04/07/2017 Document Revised: 04/07/2017 Document Reviewed: 04/07/2017 Elsevier Interactive Patient Education  2019 [employer].

## 2018-09-21 NOTE — Assessment & Plan Note (Signed)
Anxiety, depression: Since the last office visit he is much improved, good compliance with fluoxetine, just started to take 20 mg daily.  He wonders if he still needs counseling : if it is within his means that will be very positive for him.  I encouraged him to pursue that. Plan: Refill fluoxetine 20 mg daily.  Follow-up in 3 months. DJD, pain management: Currently taking 1 hydrocodone along with a Soma at night, pain is controlled at this point and the quality of his sleep has improved. DM: Control it, see last A1c.  He is more active, walking his dog, has lost some weight.  Praised it. RTC 3 months, patient will call in a couple of months to schedule.  Call sooner if needed.

## 2018-09-29 ENCOUNTER — Other Ambulatory Visit: Payer: Self-pay | Admitting: Internal Medicine

## 2018-09-29 DIAGNOSIS — F341 Dysthymic disorder: Secondary | ICD-10-CM

## 2018-09-29 NOTE — Telephone Encounter (Signed)
Requested medication (s) are due for refill today: yes  Requested medication (s) are on the active medication list: yes  Last refill:  05/04/18  Future visit scheduled: no  Notes to clinic:  Medication not delegated to NT to refill   Requested Prescriptions  Pending Prescriptions Disp Refills   ALPRAZolam (XANAX) 0.5 MG tablet 270 tablet 0    Sig: TAKE 1/2-1 TABLET BY MOUTH 3 TIMES DAILYAS NEEDED FOR ANXIETY     Not Delegated - Psychiatry:  Anxiolytics/Hypnotics Failed - 09/29/2018  1:28 PM      Failed - This refill cannot be delegated      Passed - Urine Drug Screen completed in last 360 days.      Passed - Valid encounter within last 6 months    Recent Outpatient Visits          1 week ago [facility] [place], MD   1 month ago Essential hypertension   [facility] [place], MD   5 months ago Essential hypertension   [facility] [place], MD   9 months ago Hyperglycemia   [facility] [place], MD   1 year ago Essential hypertension   [facility] [place], MD      Future Appointments            In 11 months Vevelyn Royals, Parthenia Ames, RN [employer], [place]

## 2018-09-30 ENCOUNTER — Telehealth: Payer: Self-pay

## 2018-09-30 DIAGNOSIS — F341 Dysthymic disorder: Secondary | ICD-10-CM

## 2018-09-30 MED ORDER — ALPRAZOLAM 0.5 MG PO TABS: ORAL_TABLET | ORAL | 0 refills | Status: DC

## 2018-09-30 NOTE — Telephone Encounter (Signed)
Sent!

## 2018-09-30 NOTE — Telephone Encounter (Signed)
Refill request for alprazolam.   Last OV: 09/20/2018 Last Fill: 05/04/2018 #270 and 0RF UDS: 08/17/2018 Low risk

## 2018-10-03 ENCOUNTER — Telehealth: Payer: Self-pay | Admitting: Internal Medicine

## 2018-10-04 NOTE — Telephone Encounter (Signed)
Pt is requesting refill on hydrocodone.   Last OV: 09/20/2018 Last Fill: 08/31/2018 #90 and 0RF Pt sig: 1 tab tid prn UDS: 08/17/2018 Low risk

## 2018-10-04 NOTE — Telephone Encounter (Signed)
Sent!

## 2018-10-07 ENCOUNTER — Telehealth: Payer: Self-pay

## 2018-10-07 ENCOUNTER — Other Ambulatory Visit: Payer: Self-pay | Admitting: Internal Medicine

## 2018-10-07 DIAGNOSIS — I10 Essential (primary) hypertension: Secondary | ICD-10-CM

## 2018-10-07 DIAGNOSIS — R739 Hyperglycemia, unspecified: Secondary | ICD-10-CM

## 2018-10-07 DIAGNOSIS — E785 Hyperlipidemia, unspecified: Secondary | ICD-10-CM

## 2018-10-07 MED ORDER — ATORVASTATIN CALCIUM 80 MG PO TABS: 80.0000 mg | ORAL_TABLET | Freq: Every day | ORAL | 1 refills | Status: DC

## 2018-10-07 MED ORDER — EZETIMIBE 10 MG PO TABS: 10.0000 mg | ORAL_TABLET | Freq: Every day | ORAL | 1 refills | Status: DC

## 2018-10-07 MED ORDER — RIVAROXABAN 20 MG PO TABS: 20.0000 mg | ORAL_TABLET | Freq: Every day | ORAL | 1 refills | Status: DC

## 2018-10-07 NOTE — Telephone Encounter (Signed)
Copied from [place] [id number]. Topic: Quick Communication - Rx Refill/Question >> Oct 07, 2018  8:23 AM Alanda Slim E wrote: Medication: atorvastatin (LIPITOR) 80 MG tablet  ALPRAZolam (XANAX) 0.5 MG tablet ezetimibe (ZETIA) 10 MG tablet  rivaroxaban (XARELTO) 20 MG TABS tablet  Pt is asking that if possible the above meds be sent in 3 month supply or have a few refills on them for faster and simpler refill process with his pharmacy. He is not in need of any of these for a couple of months but just wants Dr. Larose Kells to know he would like to help make the process easier. He feels that it takes a while going back and forth between his pharmacy and the office. [facility] pharmacy also send the Pt a notice saying they dont hear anything from the office after sending request/ please advise

## 2018-10-07 NOTE — Telephone Encounter (Signed)
Pt uses mail order for alprazolam- already a "3 month supply." Other meds requested sent w/ refills.

## 2018-10-18 ENCOUNTER — Encounter: Payer: Self-pay | Admitting: Internal Medicine

## 2018-10-30 ENCOUNTER — Other Ambulatory Visit: Payer: Self-pay | Admitting: Internal Medicine

## 2018-11-01 ENCOUNTER — Encounter: Payer: Self-pay | Admitting: Internal Medicine

## 2018-11-02 ENCOUNTER — Telehealth: Payer: Self-pay | Admitting: Cardiology

## 2018-11-02 NOTE — Telephone Encounter (Signed)
Spoke with pt, he is going to download the xarelto patient assistance application and fill it out and send it to me. Aware no samples available now.

## 2018-11-02 NOTE — Telephone Encounter (Signed)
Patient calling the office for samples of medication: ° ° °1.  What medication and dosage are you requesting samples for? Xarelto  ° °2.  Are you currently out of this medication? yes ° ° °

## 2018-11-17 ENCOUNTER — Telehealth: Payer: Self-pay | Admitting: Internal Medicine

## 2018-11-17 NOTE — Telephone Encounter (Signed)
Pt came in office and dropped off document to be filled out by provider Psychologist, prison and probation services- 14 pages) Pt stated not sure if document only needs page 3 filled out, please verify. Pt would like document to be faxed to [phone] and to call pt when document is fax, pt tel [phone]. Document put at front office tray under providers name.

## 2018-11-18 DIAGNOSIS — Z0279 Encounter for issue of other medical certificate: Secondary | ICD-10-CM

## 2018-11-18 MED ORDER — RIVAROXABAN 20 MG PO TABS: 20.0000 mg | ORAL_TABLET | Freq: Every day | ORAL | 3 refills | Status: DC

## 2018-11-18 NOTE — Telephone Encounter (Signed)
Mychart message sent to Pt letting him know form has been faxed and can take several weeks for determination. Fax confirmation received.

## 2018-11-18 NOTE — Telephone Encounter (Signed)
Form received, completed and faxed to [place] at [phone]. Forms sent for scanning. Awaiting determination.

## 2018-11-22 NOTE — Telephone Encounter (Signed)
Patient assistance denied at this time. Denial letter sent to scanning.

## 2018-12-12 NOTE — Progress Notes (Signed)
HPI: FU atrial fibrillation and CAD. Cardiac catheterization performed in 2001 because of abnormal nuclear study showed a 70% LAD, 80% circumflex and 80% right coronary artery. LV function normal. The patient had PCI of his circumflex and right coronary artery. Event monitor January 2016 showed episode of coarse atrial fibrillation versus flutter. Abdominal ultrasound December 2016 showed no aneurysm.  Echocardiogram October 2018 showed normal LV function and grade 1 diastolic dysfunction.  Nuclear study October 2018 showed ejection fraction 57% and normal perfusion.  Since last seen,denies dyspnea, chest pain, palpitations or syncope.  Current Outpatient Medications  Medication Sig Dispense Refill  . ALPRAZolam (XANAX) 0.5 MG tablet TAKE 1/2-1 TABLET BY MOUTH 3 TIMES DAILYAS NEEDED FOR ANXIETY 270 tablet 0  . atorvastatin (LIPITOR) 80 MG tablet Take 1 tablet (80 mg total) by mouth daily. 90 tablet 1  . carisoprodol (SOMA) 350 MG tablet TAKE ONE TABLET BY MOUTH THREE TIMES A DAY AS NEEDED FOR MUSCLE SPASMS 90 tablet 5  . Cholecalciferol (VITAMIN D) 2000 UNITS CAPS Take 1 capsule by mouth daily.    . diclofenac sodium (VOLTAREN) 1 % GEL Apply 4 g topically 4 (four) times daily as needed. 100 g 3  . ezetimibe (ZETIA) 10 MG tablet Take 1 tablet (10 mg total) by mouth daily. 90 tablet 1  . FLUoxetine (PROZAC) 20 MG capsule Take 1 capsule (20 mg total) by mouth daily. 90 capsule 1  . glucose blood test strip Use as instructed 100 each 12  . HYDROcodone-acetaminophen (NORCO/VICODIN) 5-325 MG tablet TAKE 1 TABLET 3 TIMES A DAY AS NEEDED FOR PAIN 90 tablet 0  . metoprolol succinate (TOPROL-XL) 50 MG 24 hr tablet TAKE 1 AND 1/2 TABLETS BY MOUTH ONCE DAILY WITH OR IMMEDIATELY FOLLOWING A MEAL 135 tablet 1  . Multiple Vitamin (MULTIVITAMIN) tablet Take 1 tablet by mouth daily. Calcium, Magnesium, Zinc    . omeprazole (PRILOSEC) 20 MG capsule Take 1 capsule (20 mg total) by mouth daily. 90 capsule 3   . oxybutynin (DITROPAN-XL) 10 MG 24 hr tablet Take 10 mg by mouth at bedtime.    . rivaroxaban (XARELTO) 20 MG TABS tablet Take 1 tablet (20 mg total) by mouth daily with supper. 90 tablet 3  . sildenafil (VIAGRA) 100 MG tablet Take 100 mg by mouth daily as needed for erectile dysfunction.     No current facility-administered medications for this visit.      Past Medical History:  Diagnosis Date  . Allergy   . Anxiety and depression   . Arthritis   . Atrial flutter [facility])    New diagnosis November 10, 2010, rate controlled  . B12 deficiency   . Blurred vision    episode with confusion, evaluated Dr. Erling Cruz  . BPH (benign prostatic hypertrophy)   . CAD (coronary artery disease)    stent, 2001 / nuclear, June, 2010, no ischemia  . Diverticulosis of colon   . Dyslipidemia   . ED (erectile dysfunction)   . Ejection fraction   . GERD (gastroesophageal reflux disease)    pt denies, take omeprazole d/t taking several medications not GERD per pt  . History of colonic polyps   . HTN (hypertension)   . Lumbar back pain   . Obesity   . OSA (obstructive sleep apnea)   . PAF (paroxysmal atrial fibrillation) ([place])   . Peripheral neuropathy    lower extremities  . Prediabetes   . Pulmonary nodule   . Scalp lesion  November, 2011  . Sinus bradycardia   . Stroke ([place])    tia  . Tobacco use    hx of  . Vitamin D deficiency     Past Surgical History:  Procedure Laterality Date  . arm fracture surgery     remotely  . cardica stent x2    . CLAVICLE SURGERY     remotely  . TONSILLECTOMY      Social History   Socioeconomic History  . Marital status: Widowed    Spouse name: Not on file  . Number of children: 0  . Years of education: Not on file  . Highest education level: Not on file  Occupational History  . Occupation: retired---[employer]    Employer: [employer]  . Financial resource strain: Not on file  . Food insecurity    Worry: Not on file     Inability: Not on file  . Transportation needs    Medical: Not on file    Non-medical: Not on file  Tobacco Use  . Smoking status: Current Every Day Smoker    Packs/day: 0.50    Years: 40.00    Pack years: 20.00    Types: Cigars  . Smokeless tobacco: Never Used  Substance and Sexual Activity  . Alcohol use: Yes    Alcohol/week: 5.0 standard drinks    Types: 5 Standard drinks or equivalent per week    Comment: rare  . Drug use: No  . Sexual activity: Not Currently  Lifestyle  . Physical activity    Days per week: Not on file    Minutes per session: Not on file  . Stress: Not on file  Relationships  . Social Herbalist on phone: Not on file    Gets together: Not on file    Attends religious service: Not on file    Active member of club or organization: Not on file    Attends meetings of clubs or organizations: Not on file    Relationship status: Not on file  . Intimate partner violence    Fear of current or ex partner: Not on file    Emotionally abused: Not on file    Physically abused: Not on file    Forced sexual activity: Not on file  Other Topics Concern  . Not on file  Social History Narrative   Wife had muscle dystrophy, lost wife ~10/2017   Now lives in a smaller house, by himself     Family History  Problem Relation Age of Onset  . Stroke Mother   . Alzheimer's disease Mother        Jerilynn Mages, brother   . Heart failure Father   . Stroke Father   . Alzheimer's disease Paternal Aunt   . Colon cancer Neg Hx   . Esophageal cancer Neg Hx   . Rectal cancer Neg Hx   . Stomach cancer Neg Hx   . Prostate cancer Neg Hx     ROS: Appropriate grief from recent loss of wife but no fevers or chills, productive cough, hemoptysis, dysphasia, odynophagia, melena, hematochezia, dysuria, hematuria, rash, seizure activity, orthopnea, PND, pedal edema, claudication. Remaining systems are negative.  Physical Exam: Well-developed well-nourished in no acute distress.  Skin  is warm and dry.  HEENT is normal.  Neck is supple.  Chest is clear to auscultation with normal expansion.  Cardiovascular exam is regular rate and rhythm.  Abdominal exam nontender or distended. No masses palpated. Extremities  show no edema. neuro grossly intact  ECG-sinus rhythm at a rate of 58, no ST changes.  Personally reviewed  A/P  1 coronary artery disease-most recent nuclear study showed no ischemia.  Continue medical therapy.  Continue statin.  No aspirin given need for Xarelto.  2 paroxysmal atrial fibrillation-patient remains in sinus rhythm.  Continue beta-blocker and Xarelto.  Check hemoglobin and renal function.  3 hyperlipidemia-continue statin.  Check lipids and liver.  4 hypertension-patient's blood pressure is controlled today.  Continue present medications and follow.  5 tobacco abuse-patient counseled on discontinuing.  Kirk Ruths, MD

## 2018-12-14 ENCOUNTER — Encounter: Payer: Self-pay | Admitting: Cardiology

## 2018-12-14 ENCOUNTER — Ambulatory Visit (INDEPENDENT_AMBULATORY_CARE_PROVIDER_SITE_OTHER): Payer: Medicare HMO | Admitting: Cardiology

## 2018-12-14 VITALS — BP 114/70 | HR 58 | Ht 78.0 in | Wt 282.4 lb

## 2018-12-14 DIAGNOSIS — I2510 Atherosclerotic heart disease of native coronary artery without angina pectoris: Secondary | ICD-10-CM

## 2018-12-14 DIAGNOSIS — E7800 Pure hypercholesterolemia, unspecified: Secondary | ICD-10-CM | POA: Diagnosis not present

## 2018-12-14 DIAGNOSIS — I480 Paroxysmal atrial fibrillation: Secondary | ICD-10-CM | POA: Diagnosis not present

## 2018-12-14 DIAGNOSIS — I10 Essential (primary) hypertension: Secondary | ICD-10-CM

## 2018-12-14 NOTE — Patient Instructions (Signed)
Medication Instructions:  NO CHANGE If you need a refill on your cardiac medications before your next appointment, please call your pharmacy.   Lab work: Your physician recommends that you HAVE LAB WORK TODAY If you have labs (blood work) drawn today and your tests are completely normal, you will receive your results only by: Marland Kitchen MyChart Message (if you have MyChart) OR . A paper copy in the mail If you have any lab test that is abnormal or we need to change your treatment, we will call you to review the results.  Follow-Up: At [facility], you and your health needs are our priority.  As part of our continuing mission to provide you with exceptional heart care, we have created designated Provider Care Teams.  These Care Teams include your primary Cardiologist (physician) and Advanced Practice Providers (APPs -  Physician Assistants and Nurse Practitioners) who all work together to provide you with the care you need, when you need it. You will need a follow up appointment in 12 months.  Please call our office 2 months in advance to schedule this appointment.  You will see Kirk Ruths MD.

## 2018-12-15 LAB — CBC
Hematocrit: 51 % (ref 37.5–51.0)
Hemoglobin: 17.3 g/dL (ref 13.0–17.7)
MCH: 31.6 pg (ref 26.6–33.0)
MCHC: 33.9 g/dL (ref 31.5–35.7)
MCV: 93 fL (ref 79–97)
Platelets: 222 10*3/uL (ref 150–450)
RBC: 5.48 x10E6/uL (ref 4.14–5.80)
RDW: 12.8 % (ref 11.6–15.4)
WBC: 7.1 10*3/uL (ref 3.4–10.8)

## 2018-12-15 LAB — COMPREHENSIVE METABOLIC PANEL
ALT: 20 IU/L (ref 0–44)
AST: 18 IU/L (ref 0–40)
Albumin/Globulin Ratio: 1.9 (ref 1.2–2.2)
Albumin: 4.2 g/dL (ref 3.8–4.8)
Alkaline Phosphatase: 97 IU/L (ref 39–117)
BUN/Creatinine Ratio: 19 (ref 10–24)
BUN: 17 mg/dL (ref 8–27)
Bilirubin Total: 0.5 mg/dL (ref 0.0–1.2)
CO2: 23 mmol/L (ref 20–29)
Calcium: 9.8 mg/dL (ref 8.6–10.2)
Chloride: 103 mmol/L (ref 96–106)
Creatinine, Ser: 0.88 mg/dL (ref 0.76–1.27)
GFR calc Af Amer: 101 mL/min/{1.73_m2} (ref >59)
GFR calc non Af Amer: 88 mL/min/{1.73_m2} (ref >59)
Globulin, Total: 2.2 g/dL (ref 1.5–4.5)
Glucose: 126 mg/dL — ABNORMAL HIGH (ref 65–99)
Potassium: 4.6 mmol/L (ref 3.5–5.2)
Sodium: 141 mmol/L (ref 134–144)
Total Protein: 6.4 g/dL (ref 6.0–8.5)

## 2018-12-15 LAB — LIPID PANEL
Chol/HDL Ratio: 4.4 ratio (ref 0.0–5.0)
Cholesterol, Total: 128 mg/dL (ref 100–199)
HDL: 29 mg/dL — ABNORMAL LOW (ref >39)
LDL Calculated: 60 mg/dL (ref 0–99)
Triglycerides: 196 mg/dL — ABNORMAL HIGH (ref 0–149)
VLDL Cholesterol Cal: 39 mg/dL (ref 5–40)

## 2019-01-03 ENCOUNTER — Telehealth: Payer: Self-pay

## 2019-01-03 DIAGNOSIS — F341 Dysthymic disorder: Secondary | ICD-10-CM

## 2019-01-03 MED ORDER — ALPRAZOLAM 0.5 MG PO TABS: ORAL_TABLET | ORAL | 0 refills | Status: DC

## 2019-01-03 NOTE — Telephone Encounter (Signed)
Pt called back to let us know that per [facility] his refill must be 30 days and not 90 days. Please advise  Best contact: [phone]

## 2019-01-03 NOTE — Telephone Encounter (Signed)
FYI

## 2019-01-03 NOTE — Telephone Encounter (Signed)
Noted, I already sent #270.

## 2019-01-03 NOTE — Telephone Encounter (Signed)
Alprazolam refill.   Last ov; 09/20/2018 Last fill: 09/30/2018 #270 and 6IT Uds: 08/17/2018 Low risk

## 2019-01-03 NOTE — Telephone Encounter (Signed)
sent 

## 2019-01-04 ENCOUNTER — Telehealth: Payer: Self-pay | Admitting: Internal Medicine

## 2019-01-04 DIAGNOSIS — F341 Dysthymic disorder: Secondary | ICD-10-CM

## 2019-01-04 MED ORDER — ALPRAZOLAM 0.5 MG PO TABS: ORAL_TABLET | ORAL | 0 refills | Status: DC

## 2019-01-04 NOTE — Telephone Encounter (Signed)
Medication Refill - Medication: ALPRAZolam (XANAX) 0.5 MG tablet  Short term supply - 10 day supply Patient is completely out of medication  Preferred Pharmacy (with phone number or street name):  [place], [place] - [address] [phone] (Phone) [phone] (Fax)

## 2019-01-04 NOTE — Telephone Encounter (Signed)
Requesting refill on Soma and Hydrocodone.   Last OV: 09/20/2018 Last Fill on Soma: 05/09/2018 #90 and 5RF Pt sig: 1 tab tid prn Last Fill on Hydrocodone: 10/04/2018 #90 and 0RF Pt sig: 1 tab tid prn UDS; 08/17/2018 Moderate risk   He is also requesting a 10 day supply of alprazolam be sent to his local pharmacy he is completely out. You sent #270 tablets to his mail order pharmacy yesterday.

## 2019-01-04 NOTE — Telephone Encounter (Signed)
Prescriptions sent.  Last UDS moderate risk, will recheck when he comes back in few days.

## 2019-01-08 ENCOUNTER — Encounter: Payer: Self-pay | Admitting: Internal Medicine

## 2019-01-16 ENCOUNTER — Other Ambulatory Visit: Payer: Self-pay

## 2019-01-16 ENCOUNTER — Ambulatory Visit (INDEPENDENT_AMBULATORY_CARE_PROVIDER_SITE_OTHER): Payer: Medicare HMO | Admitting: Internal Medicine

## 2019-01-16 DIAGNOSIS — F341 Dysthymic disorder: Secondary | ICD-10-CM

## 2019-01-16 DIAGNOSIS — Z794 Long term (current) use of insulin: Secondary | ICD-10-CM

## 2019-01-16 DIAGNOSIS — E118 Type 2 diabetes mellitus with unspecified complications: Secondary | ICD-10-CM | POA: Diagnosis not present

## 2019-01-16 DIAGNOSIS — J449 Chronic obstructive pulmonary disease, unspecified: Secondary | ICD-10-CM | POA: Diagnosis not present

## 2019-01-16 DIAGNOSIS — E1142 Type 2 diabetes mellitus with diabetic polyneuropathy: Secondary | ICD-10-CM | POA: Diagnosis not present

## 2019-01-16 NOTE — Progress Notes (Signed)
Subjective:    Patient ID: Brendan [name], male    DOB: [birth year], 68 y.o.   MRN: [id number]  DOS:  01/16/2019 Type of visit - description: Virtual Visit via Video Note  I connected with@   by a video enabled telemedicine application and verified that I am speaking with the correct person using two identifiers.   THIS ENCOUNTER IS A VIRTUAL VISIT DUE TO COVID-19 - PATIENT WAS NOT SEEN IN THE OFFICE. PATIENT HAS CONSENTED TO VIRTUAL VISIT / TELEMEDICINE VISIT   Location of patient: home  Location of provider: office  I discussed the limitations of evaluation and management by telemedicine and the availability of in person appointments. The patient expressed understanding and agreed to proceed.  History of Present Illness:   Follow-up Since the last office visit he is doing well. Labs done by cardiology reviewed. Anxiety: Well managed, currently taking Xanax twice a day. Pain management: Relatively well controlled   Review of Systems Admits he is not exercising much and not eating healthy mostly due to the quarantine. Sometimes when he wakes up and get out of bed he feels dizzy for 10 seconds, no headaches, diplopia, slurred speech or motor deficits  Past Medical History:  Diagnosis Date  . Allergy   . Anxiety and depression   . Arthritis   . Atrial flutter [facility])    New diagnosis November 10, 2010, rate controlled  . B12 deficiency   . Blurred vision    episode with confusion, evaluated Dr. Erling Cruz  . BPH (benign prostatic hypertrophy)   . CAD (coronary artery disease)    stent, 2001 / nuclear, June, 2010, no ischemia  . Diverticulosis of colon   . Dyslipidemia   . ED (erectile dysfunction)   . Ejection fraction   . GERD (gastroesophageal reflux disease)    pt denies, take omeprazole d/t taking several medications not GERD per pt  . History of colonic polyps   . HTN (hypertension)   . Lumbar back pain   . Obesity   . OSA (obstructive sleep apnea)   . PAF (paroxysmal  atrial fibrillation) ([place])   . Peripheral neuropathy    lower extremities  . Prediabetes   . Pulmonary nodule   . Scalp lesion    November, 2011  . Sinus bradycardia   . Stroke ([place])    tia  . Tobacco use    hx of  . Vitamin D deficiency     Past Surgical History:  Procedure Laterality Date  . arm fracture surgery     remotely  . cardica stent x2    . CLAVICLE SURGERY     remotely  . TONSILLECTOMY      Social History   Socioeconomic History  . Marital status: Widowed    Spouse name: Not on file  . Number of children: 0  . Years of education: Not on file  . Highest education level: Not on file  Occupational History  . Occupation: retired---[employer]    Employer: [employer]  . Financial resource strain: Not on file  . Food insecurity    Worry: Not on file    Inability: Not on file  . Transportation needs    Medical: Not on file    Non-medical: Not on file  Tobacco Use  . Smoking status: Current Every Day Smoker    Packs/day: 0.50    Years: 40.00    Pack years: 20.00    Types: Cigars  . Smokeless  tobacco: Never Used  Substance and Sexual Activity  . Alcohol use: Yes    Alcohol/week: 5.0 standard drinks    Types: 5 Standard drinks or equivalent per week    Comment: rare  . Drug use: No  . Sexual activity: Not Currently  Lifestyle  . Physical activity    Days per week: Not on file    Minutes per session: Not on file  . Stress: Not on file  Relationships  . Social Herbalist on phone: Not on file    Gets together: Not on file    Attends religious service: Not on file    Active member of club or organization: Not on file    Attends meetings of clubs or organizations: Not on file    Relationship status: Not on file  . Intimate partner violence    Fear of current or ex partner: Not on file    Emotionally abused: Not on file    Physically abused: Not on file    Forced sexual activity: Not on file  Other Topics  Concern  . Not on file  Social History Narrative   Wife had muscle dystrophy, lost wife ~10/2017   Now lives in a smaller house, by himself       Allergies as of 01/16/2019      Reactions   Tussionex Pennkinetic Er [hydrocod Polst-cpm Polst Er] Hives   Albuterol Palpitations   Qvar [beclomethasone] Palpitations      Medication List       Accurate as of January 16, 2019 11:59 PM. If you have any questions, ask your nurse or doctor.        ALPRAZolam 0.5 MG tablet Commonly known as: XANAX Take 1 tablet (0.5 mg total) by mouth 2 (two) times daily as needed for anxiety. What changed:   how much to take  how to take this  when to take this  reasons to take this  additional instructions Changed by: [name], MD   atorvastatin 80 MG tablet Commonly known as: LIPITOR Take 1 tablet (80 mg total) by mouth daily.   carisoprodol 350 MG tablet Commonly known as: SOMA TAKE ONE TABLET BY MOUTH THREE TIMES A DAY AS NEEDED FOR MUSCLE SPASMS   diclofenac sodium 1 % Gel Commonly known as: VOLTAREN Apply 4 g topically 4 (four) times daily as needed.   ezetimibe 10 MG tablet Commonly known as: Zetia Take 1 tablet (10 mg total) by mouth daily.   FLUoxetine 20 MG capsule Commonly known as: PROZAC Take 1 capsule (20 mg total) by mouth daily.   glucose blood test strip Use as instructed   HYDROcodone-acetaminophen 5-325 MG tablet Commonly known as: NORCO/VICODIN Take 1 tablet by mouth at bedtime. What changed: See the new instructions. Changed by: [name], MD   metoprolol succinate 50 MG 24 hr tablet Commonly known as: TOPROL-XL TAKE 1 AND 1/2 TABLETS BY MOUTH ONCE DAILY WITH OR IMMEDIATELY FOLLOWING A MEAL   multivitamin tablet Take 1 tablet by mouth daily. Calcium, Magnesium, Zinc   omeprazole 20 MG capsule Commonly known as: PRILOSEC Take 1 capsule (20 mg total) by mouth daily.   oxybutynin 10 MG 24 hr tablet Commonly known as: DITROPAN-XL Take 10 mg by mouth  at bedtime.   rivaroxaban 20 MG Tabs tablet Commonly known as: Xarelto Take 1 tablet (20 mg total) by mouth daily with supper.   sildenafil 100 MG tablet Commonly known as: VIAGRA Take 100 mg by mouth  daily as needed for erectile dysfunction.   Vitamin D 50 MCG (2000 UT) Caps Take 1 capsule by mouth daily.           Objective:   Physical Exam There were no vitals taken for this visit. This is a virtual video visit, alert oriented x3, no apparent distress    Assessment      Assessment DM (A1C increased to 6.8 on 12/2017) Peripheral neuropathy (no pain, insensitive type) HTN Hyperlipidemia Anxiety depression -- on xanax  Chronic rhinitis- sees Dr Thornell Mule, ENT CV: Dr Stanford Breed --CAD. Stress test okay 05-2015 -- Paroxysmal Atrial fibrillation --TIA --Abdominal bruit: No AAA. Ultrasound 05/2015 PULM:  OSA - + sleep study 03-2015 ,RX Cpap , not using it as off 01-2017  COPD: smoker, CT chest 12/2017 :changes noted   GU: BPH, Hypogonadism, ED. Sees urology B12 deficiency H/o pulmonary nodule, CXR 3-16 stable, no further schedule imaging Pain mngmt: has back-shoulder  pain and neuropathy  On Soma and Vicodin. Etodolac DC 2016  per Dr Larose Kells d/t concerns regards anticoagulation. Hydrocodone dose needed to be increased Has seen Dr. Erling Cruz several times since 2010:w/u  included SPE, ACE, TSH, B12 (slightly low with a normal methylmalonic acid) 08-2008 had a lumbar spine MRI with multilevel spondylosis Dr. 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  Patient states "I do not think I have diabetes, just prediabetes".  Advised patient that A1c more than 6.5 thus has  officially diabetes.  Encouraged healthy diet and stay active.  Labs on RTC Anxiety: Depression: He ran out of Xanax not long ago and felt very jittery.   States he did not like that feeling and  self decrease Xanax to twice a day and so far that is working well for him. DJD, pain management: On hydrocodone at bedtime along with Soma.  Symptoms are relatively well controlled.  UDS showed codeine in the urine, denies taking codeine.  Recheck a UDS on RTC Encourage a flu shot this fall RTC 4 months CPX.      I discussed the assessment and treatment plan with the patient. The patient was provided an opportunity to ask questions and all were answered. The patient agreed with the plan and demonstrated an understanding of the instructions.   The patient was advised to call back or seek an in-person evaluation if the symptoms worsen or if the condition fails to improve as anticipated.

## 2019-01-17 DIAGNOSIS — E118 Type 2 diabetes mellitus with unspecified complications: Secondary | ICD-10-CM | POA: Insufficient documentation

## 2019-01-17 NOTE — Assessment & Plan Note (Signed)
DM: Diet controlled, last A1c was 6.7.  Patient states "I do not think I have diabetes, just prediabetes".  Advised patient that A1c more than 6.5 thus has  officially diabetes.  Encouraged healthy diet and stay active.  Labs on RTC Anxiety: Depression: He ran out of Xanax not long ago and felt very jittery.  States he did not like that feeling and  self decrease Xanax to twice a day and so far that is working well for him. DJD, pain management: On hydrocodone at bedtime along with Soma.  Symptoms are relatively well controlled.  UDS showed codeine in the urine, denies taking codeine.  Recheck a UDS on RTC Encourage a flu shot this fall RTC 4 months CPX.

## 2019-01-26 ENCOUNTER — Other Ambulatory Visit: Payer: Self-pay

## 2019-01-26 MED ORDER — OMEPRAZOLE 20 MG PO CPDR: 20.0000 mg | DELAYED_RELEASE_CAPSULE | Freq: Every day | ORAL | 3 refills | Status: DC

## 2019-01-31 ENCOUNTER — Encounter: Payer: Self-pay | Admitting: Internal Medicine

## 2019-02-10 ENCOUNTER — Ambulatory Visit (INDEPENDENT_AMBULATORY_CARE_PROVIDER_SITE_OTHER): Payer: Medicare HMO

## 2019-02-10 ENCOUNTER — Other Ambulatory Visit: Payer: Self-pay

## 2019-02-10 DIAGNOSIS — Z23 Encounter for immunization: Secondary | ICD-10-CM

## 2019-02-17 ENCOUNTER — Telehealth: Payer: Self-pay | Admitting: Internal Medicine

## 2019-02-17 NOTE — Telephone Encounter (Signed)
Pharmacy:  [place], [place] - [address] [phone] (Phone) [phone] (Fax)     Pt says that he was told to have shingles shot done at his pharmacy. Pt says the pharmacy advised that he need to have a Rx, pt would like to have one sent in

## 2019-02-20 MED ORDER — SHINGRIX 50 MCG/0.5ML IM SUSR: 0.5000 mL | Freq: Once | INTRAMUSCULAR | 1 refills | Status: AC

## 2019-02-20 NOTE — Telephone Encounter (Signed)
Rx sent 

## 2019-03-08 ENCOUNTER — Telehealth: Payer: Self-pay | Admitting: Internal Medicine

## 2019-03-08 DIAGNOSIS — F341 Dysthymic disorder: Secondary | ICD-10-CM

## 2019-03-08 MED ORDER — FLUOXETINE HCL 20 MG PO CAPS: 20.0000 mg | ORAL_CAPSULE | Freq: Every day | ORAL | 1 refills | Status: DC

## 2019-03-08 NOTE — Telephone Encounter (Signed)
Denied, 1 month too early

## 2019-03-08 NOTE — Telephone Encounter (Signed)
Medication Refill - Medication: ALPRAZolam (XANAX) 0.5 MG tablet/FLUoxetine (PROZAC) 20 MG capsule  Has the patient contacted their pharmacy? Yes.   (Agent: If no, request that the patient contact the pharmacy for the refill.) (Agent: If yes, when and what did the pharmacy advise?)  Preferred Pharmacy (with phone number or street name): [place], [place] [phone] (Phone) [phone] (Fax)     Agent: Please be advised that RX refills may take up to 3 business days. We ask that you follow-up with your pharmacy.

## 2019-03-08 NOTE — Telephone Encounter (Signed)
Alprazolam refill.   Last OV: 01/16/2019 Last Fill: 01/04/2019 #270 and 0RF Pt sig: 1/2-1 tab tid prn New sig on med list is 1 tab bid prn UDS: 08/17/2018 Moderate risk

## 2019-03-08 NOTE — Telephone Encounter (Signed)
Pt informed via mychart

## 2019-03-15 ENCOUNTER — Other Ambulatory Visit: Payer: Self-pay | Admitting: Internal Medicine

## 2019-03-15 DIAGNOSIS — F341 Dysthymic disorder: Secondary | ICD-10-CM

## 2019-03-15 MED ORDER — ALPRAZOLAM 0.5 MG PO TABS: 0.5000 mg | ORAL_TABLET | Freq: Two times a day (BID) | ORAL | 0 refills | Status: DC | PRN

## 2019-04-01 ENCOUNTER — Other Ambulatory Visit: Payer: Self-pay | Admitting: Internal Medicine

## 2019-04-03 ENCOUNTER — Other Ambulatory Visit: Payer: Self-pay

## 2019-04-03 ENCOUNTER — Other Ambulatory Visit: Payer: Self-pay | Admitting: Internal Medicine

## 2019-04-03 DIAGNOSIS — M545 Low back pain, unspecified: Secondary | ICD-10-CM

## 2019-04-03 DIAGNOSIS — Z79899 Other long term (current) drug therapy: Secondary | ICD-10-CM

## 2019-04-03 DIAGNOSIS — G8929 Other chronic pain: Secondary | ICD-10-CM

## 2019-04-03 DIAGNOSIS — F341 Dysthymic disorder: Secondary | ICD-10-CM

## 2019-04-03 NOTE — Telephone Encounter (Signed)
Last UDS moderate risk. advised patient to get a UDS today or tomorrow.

## 2019-04-03 NOTE — Telephone Encounter (Signed)
Hydrocodone refill.   Last OV: 01/16/2019 Last Fill: 01/04/2019 #90 and 0RF Pt sig: 1 tab tid prn UDS: 08/17/2018 Moderate risk

## 2019-04-03 NOTE — Addendum Note (Signed)
Addended by: Kelle Darting A on: 04/03/2019 11:03 AM   Modules accepted: Orders

## 2019-04-05 ENCOUNTER — Other Ambulatory Visit (INDEPENDENT_AMBULATORY_CARE_PROVIDER_SITE_OTHER): Payer: Medicare HMO

## 2019-04-05 ENCOUNTER — Other Ambulatory Visit: Payer: Self-pay

## 2019-04-05 DIAGNOSIS — F341 Dysthymic disorder: Secondary | ICD-10-CM | POA: Diagnosis not present

## 2019-04-05 DIAGNOSIS — G8929 Other chronic pain: Secondary | ICD-10-CM

## 2019-04-05 DIAGNOSIS — Z79899 Other long term (current) drug therapy: Secondary | ICD-10-CM | POA: Diagnosis not present

## 2019-04-05 DIAGNOSIS — M545 Low back pain: Secondary | ICD-10-CM | POA: Diagnosis not present

## 2019-04-07 LAB — PAIN MGMT, PROFILE 8 W/CONF, U
6 Acetylmorphine: NEGATIVE ng/mL
Alcohol Metabolites: NEGATIVE ng/mL
Alphahydroxyalprazolam: 78 ng/mL
Alphahydroxymidazolam: NEGATIVE ng/mL
Alphahydroxytriazolam: NEGATIVE ng/mL
Aminoclonazepam: NEGATIVE ng/mL
Amphetamines: NEGATIVE ng/mL
Benzodiazepines: POSITIVE ng/mL
Buprenorphine, Urine: NEGATIVE ng/mL
Cocaine Metabolite: NEGATIVE ng/mL
Codeine: NEGATIVE ng/mL
Creatinine: 52.1 mg/dL
Hydrocodone: 162 ng/mL
Hydromorphone: NEGATIVE ng/mL
Hydroxyethylflurazepam: NEGATIVE ng/mL
Lorazepam: NEGATIVE ng/mL
MDMA: NEGATIVE ng/mL
Marijuana Metabolite: NEGATIVE ng/mL
Morphine: NEGATIVE ng/mL
Nordiazepam: NEGATIVE ng/mL
Norhydrocodone: 193 ng/mL
Opiates: POSITIVE ng/mL
Oxazepam: NEGATIVE ng/mL
Oxidant: NEGATIVE ug/mL
Oxycodone: NEGATIVE ng/mL
Temazepam: NEGATIVE ng/mL
pH: 5.4 (ref 4.5–9.0)

## 2019-04-10 ENCOUNTER — Other Ambulatory Visit: Payer: Self-pay | Admitting: Internal Medicine

## 2019-04-10 MED ORDER — HYDROCODONE-ACETAMINOPHEN 5-325 MG PO TABS: 1.0000 | ORAL_TABLET | Freq: Every day | ORAL | 0 refills | Status: DC

## 2019-04-21 ENCOUNTER — Other Ambulatory Visit: Payer: Self-pay | Admitting: Internal Medicine

## 2019-04-21 MED ORDER — HYDROCODONE-ACETAMINOPHEN 5-325 MG PO TABS: 1.0000 | ORAL_TABLET | Freq: Every day | ORAL | 0 refills | Status: DC

## 2019-04-21 NOTE — Telephone Encounter (Signed)
I agree, the previous Rx was  likely printed.  We will send it electronically today

## 2019-04-21 NOTE — Telephone Encounter (Signed)
This looks like this was printed and not sent to pharmacy can you send to pharmacy.

## 2019-04-21 NOTE — Telephone Encounter (Signed)
He would like it sent to [facility].

## 2019-04-21 NOTE — Telephone Encounter (Signed)
Requested medication (s) are due for refill today: yes  Requested medication (s) are on the active medication list: yes  Last refill:  04/10/2019  Future visit scheduled: yes  Notes to clinic:  Refill cannot be delegated  Pt states the mail order pharmacy does not have record of this medication. Pt is requesting to have this sent in. Pt states he is completely out of the medication x1wk. Please advise.    Requested Prescriptions  Pending Prescriptions Disp Refills   HYDROcodone-acetaminophen (NORCO/VICODIN) 5-325 MG tablet 90 tablet 0    Sig: Take 1 tablet by mouth at bedtime.     Not Delegated - Analgesics:  Opioid Agonist Combinations Failed - 04/21/2019  9:30 AM      Failed - This refill cannot be delegated      Passed - Urine Drug Screen completed in last 360 days.      Passed - Valid encounter within last 6 months    Recent Outpatient Visits          3 months ago [facility] [facility] [place], MD   7 months ago [facility] [place], MD   8 months ago Essential hypertension   [facility] [place], MD   1 year ago Essential hypertension   [facility] [place], MD   1 year ago Hyperglycemia   [facility] [place], MD      Future Appointments            In 5 months Vevelyn Royals, Parthenia [place], RN [facility], [place]

## 2019-04-21 NOTE — Telephone Encounter (Signed)
Medication Refill - Medication: Hydrocodone  Has the patient contacted their pharmacy? Yes.   Pt states the mail order pharmacy does not have record of this medication. Pt is requesting to have this sent in. Pt states he is completely out of the medication x1wk. Please advise.  (Agent: If no, request that the patient contact the pharmacy for the refill.) (Agent: If yes, when and what did the pharmacy advise?)  Preferred Pharmacy (with phone number or street name):  [place], [place] - [address]  [address] [place] [address]  Phone: [phone] Fax: [phone]  Not a 24 hour pharmacy; exact hours not known.     Agent: Please be advised that RX refills may take up to 3 business days. We ask that you follow-up with your pharmacy.

## 2019-05-08 ENCOUNTER — Telehealth: Payer: Self-pay | Admitting: Internal Medicine

## 2019-05-08 DIAGNOSIS — F341 Dysthymic disorder: Secondary | ICD-10-CM

## 2019-05-08 MED ORDER — CARISOPRODOL 350 MG PO TABS: ORAL_TABLET | ORAL | 1 refills | Status: DC

## 2019-05-08 NOTE — Telephone Encounter (Signed)
Refill request for Alprazolam, hydrocodone, and Soma.   Last OV: 01/16/2019  Last Fill on alprazolam: 03/15/2019 #180 and 0RF Pt sig: 1 tab bid prn  Last Fill on hydrocodone: 04/21/2019 #90 and 0RF Pt sig: 1 tab qhs  Last Fill on Soma: 01/04/2019 #90 and 1RF Pt sig: 1 tab tid prn

## 2019-05-08 NOTE — Telephone Encounter (Signed)
Xanax and hydrocodone too early to refill. Soma refill sent

## 2019-05-17 ENCOUNTER — Other Ambulatory Visit: Payer: Self-pay | Admitting: Internal Medicine

## 2019-05-17 DIAGNOSIS — I10 Essential (primary) hypertension: Secondary | ICD-10-CM

## 2019-05-22 DIAGNOSIS — N3281 Overactive bladder: Secondary | ICD-10-CM | POA: Diagnosis not present

## 2019-05-28 ENCOUNTER — Other Ambulatory Visit: Payer: Self-pay | Admitting: Internal Medicine

## 2019-05-28 DIAGNOSIS — R739 Hyperglycemia, unspecified: Secondary | ICD-10-CM

## 2019-06-03 ENCOUNTER — Encounter: Payer: Self-pay | Admitting: Internal Medicine

## 2019-06-07 ENCOUNTER — Other Ambulatory Visit: Payer: Self-pay | Admitting: Internal Medicine

## 2019-06-07 DIAGNOSIS — E785 Hyperlipidemia, unspecified: Secondary | ICD-10-CM

## 2019-06-15 DIAGNOSIS — M19011 Primary osteoarthritis, right shoulder: Secondary | ICD-10-CM | POA: Diagnosis not present

## 2019-06-19 ENCOUNTER — Other Ambulatory Visit: Payer: Self-pay | Admitting: Internal Medicine

## 2019-06-19 ENCOUNTER — Encounter: Payer: Self-pay | Admitting: Internal Medicine

## 2019-06-19 DIAGNOSIS — F341 Dysthymic disorder: Secondary | ICD-10-CM

## 2019-06-19 MED ORDER — FLUOXETINE HCL 20 MG PO CAPS: 20.0000 mg | ORAL_CAPSULE | Freq: Every day | ORAL | 1 refills | Status: DC

## 2019-06-19 MED ORDER — ALPRAZOLAM 0.5 MG PO TABS: 0.5000 mg | ORAL_TABLET | Freq: Two times a day (BID) | ORAL | 0 refills | Status: DC | PRN

## 2019-06-19 NOTE — Telephone Encounter (Signed)
I have sent in the fluoxetine.  alprazolam Last written: 03/15/19 Last ov: 01/16/19  Next ov:  none Contract: 08/17/18 UDS: 08/17/18

## 2019-06-19 NOTE — Telephone Encounter (Signed)
Last written: 04/21/19 Last ov: 01/16/19 Next ov: nothing schedule Contract: 08/17/18 UDS: 08/17/18 moderate risk

## 2019-06-20 ENCOUNTER — Encounter: Payer: Self-pay | Admitting: *Deleted

## 2019-06-20 NOTE — Telephone Encounter (Signed)
Pt called saying [employer] will only fill the generic xanax one month at a time.  He ask that Dr. Larose Kells send the 3 moth of xanax to [facility].  CB#  [phone]

## 2019-06-21 ENCOUNTER — Other Ambulatory Visit: Payer: Self-pay | Admitting: Internal Medicine

## 2019-06-21 ENCOUNTER — Encounter: Payer: Self-pay | Admitting: Internal Medicine

## 2019-06-21 DIAGNOSIS — F341 Dysthymic disorder: Secondary | ICD-10-CM

## 2019-06-21 MED ORDER — ALPRAZOLAM 0.5 MG PO TABS: 0.5000 mg | ORAL_TABLET | Freq: Two times a day (BID) | ORAL | 0 refills | Status: DC | PRN

## 2019-06-23 ENCOUNTER — Other Ambulatory Visit: Payer: Self-pay

## 2019-06-23 ENCOUNTER — Telehealth: Payer: Self-pay | Admitting: *Deleted

## 2019-06-23 NOTE — Telephone Encounter (Signed)
Pharmacy sent a fax wanting to clarify directions for hydrocodone.  Patient usually takes tid and it was written for 1 at bedtime.

## 2019-06-24 NOTE — Telephone Encounter (Signed)
Advise pharmacy: The prescription reflects what the patient is doing, he takes 1 tablet at bedtime.

## 2019-06-26 ENCOUNTER — Other Ambulatory Visit: Payer: Self-pay

## 2019-06-26 ENCOUNTER — Ambulatory Visit (INDEPENDENT_AMBULATORY_CARE_PROVIDER_SITE_OTHER): Payer: Medicare HMO | Admitting: Internal Medicine

## 2019-06-26 ENCOUNTER — Encounter: Payer: Self-pay | Admitting: Internal Medicine

## 2019-06-26 VITALS — BP 106/66 | HR 65 | Temp 96.0°F | Resp 12 | Ht 78.0 in | Wt 271.4 lb

## 2019-06-26 DIAGNOSIS — E538 Deficiency of other specified B group vitamins: Secondary | ICD-10-CM

## 2019-06-26 DIAGNOSIS — F341 Dysthymic disorder: Secondary | ICD-10-CM | POA: Diagnosis not present

## 2019-06-26 DIAGNOSIS — Z794 Long term (current) use of insulin: Secondary | ICD-10-CM | POA: Diagnosis not present

## 2019-06-26 DIAGNOSIS — G8929 Other chronic pain: Secondary | ICD-10-CM

## 2019-06-26 DIAGNOSIS — E118 Type 2 diabetes mellitus with unspecified complications: Secondary | ICD-10-CM | POA: Diagnosis not present

## 2019-06-26 DIAGNOSIS — I10 Essential (primary) hypertension: Secondary | ICD-10-CM | POA: Diagnosis not present

## 2019-06-26 DIAGNOSIS — G4452 New daily persistent headache (NDPH): Secondary | ICD-10-CM

## 2019-06-26 DIAGNOSIS — N400 Enlarged prostate without lower urinary tract symptoms: Secondary | ICD-10-CM | POA: Diagnosis not present

## 2019-06-26 DIAGNOSIS — Z79899 Other long term (current) drug therapy: Secondary | ICD-10-CM

## 2019-06-26 DIAGNOSIS — M545 Low back pain: Secondary | ICD-10-CM

## 2019-06-26 DIAGNOSIS — E1159 Type 2 diabetes mellitus with other circulatory complications: Secondary | ICD-10-CM | POA: Diagnosis not present

## 2019-06-26 LAB — COMPREHENSIVE METABOLIC PANEL
ALT: 26 U/L (ref 0–53)
AST: 19 U/L (ref 0–37)
Albumin: 4.1 g/dL (ref 3.5–5.2)
Alkaline Phosphatase: 77 U/L (ref 39–117)
BUN: 15 mg/dL (ref 6–23)
CO2: 31 mEq/L (ref 19–32)
Calcium: 9.7 mg/dL (ref 8.4–10.5)
Chloride: 103 mEq/L (ref 96–112)
Creatinine, Ser: 0.83 mg/dL (ref 0.40–1.50)
GFR: 91.5 mL/min (ref >60.00)
Glucose, Bld: 111 mg/dL — ABNORMAL HIGH (ref 70–99)
Potassium: 4.9 mEq/L (ref 3.5–5.1)
Sodium: 140 mEq/L (ref 135–145)
Total Bilirubin: 0.8 mg/dL (ref 0.2–1.2)
Total Protein: 6.4 g/dL (ref 6.0–8.3)

## 2019-06-26 LAB — HEMOGLOBIN A1C: Hgb A1c MFr Bld: 6.2 % (ref 4.6–6.5)

## 2019-06-26 LAB — SEDIMENTATION RATE: Sed Rate: 5 mm/hr (ref 0–20)

## 2019-06-26 LAB — VITAMIN B12: Vitamin B-12: 569 pg/mL (ref 211–911)

## 2019-06-26 LAB — PSA: PSA: 0.53 ng/mL (ref 0.10–4.00)

## 2019-06-26 NOTE — Progress Notes (Signed)
Subjective:    Patient ID: Brendan [name], male    DOB: 15-Sep-1950, 69 y.o.   MRN: [id number]  DOS:  06/26/2019 Type of visit - description: Follow-up Today we address his diabetes, pain management, anxiety, BPH, new onset of headaches and B12 deficiency.  He reports that 3 months ago started to have headaches typically every 2 weeks, located either at the bilateral top of the head or at the nuchal area. The HA last 15 minutes and self resolv. At times the headaches are intense but not the worst of his life. Denies fever, chills,  visual disturbances. Modest weight loss noted. Headache not associated with nausea, vomiting, light or noise intolerance. No neck pain per se.  Wt Readings from Last 3 Encounters:  06/26/19 271 lb 6.4 oz (123.1 kg)  12/14/18 282 lb 6.4 oz (128.1 kg)  08/17/18 299 lb (135.6 kg)     Review of Systems See above  Also denies chest pain, difficulty breathing, blood in the urine or blood in the stools.  Past Medical History:  Diagnosis Date  . Allergy   . Anxiety and depression   . Arthritis   . Atrial flutter [facility])    New diagnosis November 10, 2010, rate controlled  . B12 deficiency   . Blurred vision    episode with confusion, evaluated Dr. Erling Cruz  . BPH (benign prostatic hypertrophy)   . CAD (coronary artery disease)    stent, 2001 / nuclear, June, 2010, no ischemia  . Diverticulosis of colon   . Dyslipidemia   . ED (erectile dysfunction)   . Ejection fraction   . GERD (gastroesophageal reflux disease)    pt denies, take omeprazole d/t taking several medications not GERD per pt  . History of colonic polyps   . HTN (hypertension)   . Lumbar back pain   . Obesity   . OSA (obstructive sleep apnea)   . PAF (paroxysmal atrial fibrillation) ([place])   . Peripheral neuropathy    lower extremities  . Prediabetes   . Pulmonary nodule   . Scalp lesion    November, 2011  . Sinus bradycardia   . Stroke ([place])    tia  . Tobacco use    hx of  .  Vitamin D deficiency     Past Surgical History:  Procedure Laterality Date  . arm fracture surgery     remotely  . cardica stent x2    . CLAVICLE SURGERY     remotely  . TONSILLECTOMY        Objective:   Physical Exam BP 106/66 (BP Location: Left Arm, Cuff Size: Large)   Pulse 65   Temp (!) 96 F (35.6 C) (Temporal)   Resp 12   Ht 6\' 6"  (1.981 m)   Wt 271 lb 6.4 oz (123.1 kg)   SpO2 95%   BMI 31.36 kg/m  General:   Well developed, NAD, BMI noted. HEENT:  Normocephalic . Face symmetric, atraumatic Lungs:  CTA B Normal respiratory effort, no intercostal retractions, no accessory muscle use. Heart: RRR,  no murmur.  No pretibial edema bilaterally DM foot exam: + Toe deformities, some calluses mostly at the big toes, decreased sensitivity distally and symmetrically.  Good pulses, no edema Skin: Not pale. Not jaundice Neurologic:  alert & oriented X3.  Speech normal, gait appropriate for age and unassisted EOMI, face symmetric.  Motor symmetric.  DTRs symmetric except for decrease bilateral ankle jerk. Psych--  Cognition and judgment appear intact.  Cooperative with  normal attention span and concentration.  Behavior appropriate. No anxious or depressed appearing.      Assessment    Assessment DM (A1C increased to 6.8 on 12/2017) Peripheral neuropathy (no pain, insensitive type) HTN Hyperlipidemia Anxiety depression -- on xanax  Chronic rhinitis; Dr Thornell Mule, ENT CV: Dr Stanford Breed --CAD. Stress test okay 05-2015 -- Paroxysmal Atrial fibrillation --TIA --Abdominal bruit: No AAA. Ultrasound 05/2015 PULM:  OSA - + sleep study 03-2015 ,RX Cpap  intolerant   COPD: smoker, CT chest 12/2017 :changes noted   GU: BPH, Hypogonadism, ED. Sees urology B12 deficiency H/o pulmonary nodule, CXR 3-16 stable, no further schedule imaging Pain mngmt: has back-shoulder  pain and neuropathy  On Soma and Vicodin. Etodolac DC 2016  per Dr Larose Kells d/t concerns regards anticoagulation.  Hydrocodone dose needed to be increased Has seen Dr. Erling Cruz several times since 2010:w/u  included SPE, ACE, TSH, B12 (slightly low with a normal methylmalonic acid) 08-2008 had a lumbar spine MRI with multilevel spondylosis. Dr. 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 Currently diet controlled, foot exam consistent with deformity, calluses and decreased sensitivity.  Feet care discussed.  Check A1c HTN: Seems controlled on metoprolol.  Check a CMP Anxiety depression: On fluoxetine, Xanax twice daily as needed.  Check a UDS CAD: Asymptomatic BPH: Sees urology regularly, request a PSA to be sent to his urologist.  Will do B12 deficiency: Not on supplements 4 months, check levels. New onset of headache: The patient is 69 years old, new onset of recurrent headache, neuro exam showing neuropathy and decreased ankle jerks bilaterally.  Check a MRI and a sed rate. DJD, pain management: On hydrocodone nightly.  Check UDS RTC 3 months     This visit occurred during the SARS-CoV-2 public health emergency.  Safety protocols were in place, including screening questions prior to the visit, additional usage of staff PPE, and extensive cleaning of exam room while observing appropriate contact time as indicated for disinfecting solutions.

## 2019-06-26 NOTE — Patient Instructions (Addendum)
Per our records you are due for an eye exam. Please contact your eye doctor to schedule an appointment. Please have them send copies of your office visit notes to [place]. Our fax number is [phone].   GO TO THE LAB : Get the blood work     GO TO THE FRONT DESK Schedule your next appointment   for a checkup in 3 months    Diabetes Mellitus and [place] care is an important part of your health, especially when you have diabetes. Diabetes may cause you to have problems because of poor blood flow (circulation) to your feet and legs, which can cause your skin to:  Become thinner and drier.  Break more easily.  Heal more slowly.  Peel and crack. You may also have nerve damage (neuropathy) in your legs and feet, causing decreased feeling in them. This means that you may not notice minor injuries to your feet that could lead to more serious problems. Noticing and addressing any potential problems early is the best way to prevent future foot problems. How to care for your feet Foot hygiene  Wash your feet daily with warm water and mild soap. Do not use hot water. Then, pat your feet and the areas between your toes until they are completely dry. Do not soak your feet as this can dry your skin.  Trim your toenails straight across. Do not dig under them or around the cuticle. File the edges of your nails with an emery board or nail file.  Apply a moisturizing lotion or petroleum jelly to the skin on your feet and to dry, brittle toenails. Use lotion that does not contain alcohol and is unscented. Do not apply lotion between your toes. Shoes and socks  Wear clean socks or stockings every day. Make sure they are not too tight. Do not wear knee-high stockings since they may decrease blood flow to your legs.  Wear shoes that fit properly and have enough cushioning. Always look in your shoes before you put them on to be sure there are no objects inside.  To break in new shoes, wear them for  just a few hours a day. This prevents injuries on your feet. Wounds, scrapes, corns, and calluses  Check your feet daily for blisters, cuts, bruises, sores, and redness. If you cannot see the bottom of your feet, use a mirror or ask someone for help.  Do not cut corns or calluses or try to remove them with medicine.  If you find a minor scrape, cut, or break in the skin on your feet, keep it and the skin around it clean and dry. You may clean these areas with mild soap and water. Do not clean the area with peroxide, alcohol, or iodine.  If you have a wound, scrape, corn, or callus on your foot, look at it several times a day to make sure it is healing and not infected. Check for: ? Redness, swelling, or pain. ? Fluid or blood. ? Warmth. ? Pus or a bad smell. General instructions  Do not cross your legs. This may decrease blood flow to your feet.  Do not use heating pads or hot water bottles on your feet. They may burn your skin. If you have lost feeling in your feet or legs, you may not know this is happening until it is too late.  Protect your feet from hot and cold by wearing shoes, such as at the beach or on hot pavement.  Schedule  a complete foot exam at least once a year (annually) or more often if you have foot problems. If you have foot problems, report any cuts, sores, or bruises to your health care provider immediately. Contact a health care provider if:  You have a medical condition that increases your risk of infection and you have any cuts, sores, or bruises on your feet.  You have an injury that is not healing.  You have redness on your legs or feet.  You feel burning or tingling in your legs or feet.  You have pain or cramps in your legs and feet.  Your legs or feet are numb.  Your feet always feel cold.  You have pain around a toenail. Get help right away if:  You have a wound, scrape, corn, or callus on your foot and: ? You have pain, swelling, or redness  that gets worse. ? You have fluid or blood coming from the wound, scrape, corn, or callus. ? Your wound, scrape, corn, or callus feels warm to the touch. ? You have pus or a bad smell coming from the wound, scrape, corn, or callus. ? You have a fever. ? You have a red line going up your leg. Summary  Check your feet every day for cuts, sores, red spots, swelling, and blisters.  Moisturize feet and legs daily.  Wear shoes that fit properly and have enough cushioning.  If you have foot problems, report any cuts, sores, or bruises to your health care provider immediately.  Schedule a complete foot exam at least once a year (annually) or more often if you have foot problems. This information is not intended to replace advice given to you by your health care provider. Make sure you discuss any questions you have with your health care provider. Document Revised: 02/15/2019 Document Reviewed: 06/26/2016 Elsevier Patient Education  [place].

## 2019-06-26 NOTE — Assessment & Plan Note (Signed)
DM, complicated by neuropathy, CAD. Currently diet controlled, foot exam consistent with deformity, calluses and decreased sensitivity.  Feet care discussed.  Check A1c HTN: Seems controlled on metoprolol.  Check a CMP Anxiety depression: On fluoxetine, Xanax twice daily as needed.  Check a UDS CAD: Asymptomatic BPH: Sees urology regularly, request a PSA to be sent to his urologist.  Will do B12 deficiency: Not on supplements 4 months, check levels. New onset of headache: The patient is 69 years old, new onset of recurrent headache, neuro exam showing neuropathy and decreased ankle jerks bilaterally.  Check a MRI and a sed rate. DJD, pain management: On hydrocodone nightly.  Check UDS RTC 3 months

## 2019-06-26 NOTE — Telephone Encounter (Signed)
Pharmacy notified.

## 2019-06-27 ENCOUNTER — Encounter: Payer: Self-pay | Admitting: Internal Medicine

## 2019-06-28 LAB — PAIN MGMT, PROFILE 8 W/CONF, U
6 Acetylmorphine: NEGATIVE ng/mL
Alcohol Metabolites: NEGATIVE ng/mL (ref <500)
Alphahydroxyalprazolam: 296 ng/mL
Alphahydroxymidazolam: NEGATIVE ng/mL
Alphahydroxytriazolam: NEGATIVE ng/mL
Aminoclonazepam: NEGATIVE ng/mL
Amphetamines: NEGATIVE ng/mL
Benzodiazepines: POSITIVE ng/mL
Buprenorphine, Urine: NEGATIVE ng/mL
Cocaine Metabolite: NEGATIVE ng/mL
Codeine: NEGATIVE ng/mL
Creatinine: 200.2 mg/dL
Hydrocodone: 568 ng/mL
Hydromorphone: NEGATIVE ng/mL
Hydroxyethylflurazepam: NEGATIVE ng/mL
Lorazepam: NEGATIVE ng/mL
MDMA: NEGATIVE ng/mL
Marijuana Metabolite: NEGATIVE ng/mL
Morphine: NEGATIVE ng/mL
Nordiazepam: NEGATIVE ng/mL
Norhydrocodone: 935 ng/mL
Opiates: POSITIVE ng/mL
Oxazepam: NEGATIVE ng/mL
Oxidant: NEGATIVE ug/mL
Oxycodone: NEGATIVE ng/mL
Temazepam: NEGATIVE ng/mL
pH: 5.2 (ref 4.5–9.0)

## 2019-06-29 ENCOUNTER — Encounter: Payer: Self-pay | Admitting: Internal Medicine

## 2019-06-29 ENCOUNTER — Ambulatory Visit: Payer: Medicare HMO | Attending: Internal Medicine

## 2019-06-29 DIAGNOSIS — Z23 Encounter for immunization: Secondary | ICD-10-CM | POA: Insufficient documentation

## 2019-06-29 NOTE — Progress Notes (Signed)
   Covid-19 Vaccination Clinic  Name:  CHLOE MAGGIORE    MRN: [id number] DOB: 29-Jul-1950  06/29/2019  Mr. Deptula was observed post Covid-19 immunization for 15 minutes without incidence. He was provided with Vaccine Information Sheet and instruction to access the V-Safe system.   Mr. Riggan was instructed to call 911 with any severe reactions post vaccine: Marland Kitchen Difficulty breathing  . Swelling of your face and throat  . A fast heartbeat  . A bad rash all over your body  . Dizziness and weakness    Immunizations Administered    Name Date Dose VIS Date Route   Pfizer COVID-19 Vaccine 06/29/2019  4:53 PM 0.3 mL 05/19/2019 Intramuscular   Manufacturer: [place]   Lot: [id number]   [place]: [id number]

## 2019-07-08 ENCOUNTER — Ambulatory Visit (HOSPITAL_BASED_OUTPATIENT_CLINIC_OR_DEPARTMENT_OTHER)
Admission: RE | Admit: 2019-07-08 | Discharge: 2019-07-08 | Disposition: A | Discharge status: Home / Self Care | Payer: Medicare HMO | Source: Ambulatory Visit | Attending: Internal Medicine | Admitting: Internal Medicine

## 2019-07-08 ENCOUNTER — Other Ambulatory Visit: Payer: Self-pay

## 2019-07-08 DIAGNOSIS — G4452 New daily persistent headache (NDPH): Secondary | ICD-10-CM | POA: Insufficient documentation

## 2019-07-08 DIAGNOSIS — R519 Headache, unspecified: Secondary | ICD-10-CM | POA: Diagnosis not present

## 2019-07-10 DIAGNOSIS — H35033 Hypertensive retinopathy, bilateral: Secondary | ICD-10-CM | POA: Diagnosis not present

## 2019-07-10 DIAGNOSIS — H2513 Age-related nuclear cataract, bilateral: Secondary | ICD-10-CM | POA: Diagnosis not present

## 2019-07-10 DIAGNOSIS — H02831 Dermatochalasis of right upper eyelid: Secondary | ICD-10-CM | POA: Diagnosis not present

## 2019-07-10 DIAGNOSIS — H43813 Vitreous degeneration, bilateral: Secondary | ICD-10-CM | POA: Diagnosis not present

## 2019-07-10 LAB — HM DIABETES EYE EXAM

## 2019-07-11 DIAGNOSIS — H5213 Myopia, bilateral: Secondary | ICD-10-CM | POA: Diagnosis not present

## 2019-07-11 DIAGNOSIS — H524 Presbyopia: Secondary | ICD-10-CM | POA: Diagnosis not present

## 2019-07-11 DIAGNOSIS — H52209 Unspecified astigmatism, unspecified eye: Secondary | ICD-10-CM | POA: Diagnosis not present

## 2019-07-12 ENCOUNTER — Encounter: Payer: Self-pay | Admitting: Internal Medicine

## 2019-07-19 ENCOUNTER — Ambulatory Visit: Payer: Medicare HMO | Attending: Internal Medicine

## 2019-07-19 DIAGNOSIS — Z23 Encounter for immunization: Secondary | ICD-10-CM | POA: Insufficient documentation

## 2019-07-19 NOTE — Progress Notes (Signed)
   Covid-19 Vaccination Clinic  Name:  Brendan [name]    MRN: [id number] DOB: [birth year]  07/19/2019  [name] was observed post Covid-19 immunization for 15 minutes without incidence. He was provided with Vaccine Information Sheet and instruction to access the V-Safe system.   [name] was instructed to call 911 with any severe reactions post vaccine: Marland Kitchen Difficulty breathing  . Swelling of your face and throat  . A fast heartbeat  . A bad rash all over your body  . Dizziness and weakness    Immunizations Administered    Name Date Dose VIS Date Route   Pfizer COVID-19 Vaccine 07/19/2019  2:50 PM 0.3 mL 05/19/2019 Intramuscular   Manufacturer: [place]   Lot: [id number]   [place]: [id number]

## 2019-08-14 ENCOUNTER — Telehealth: Payer: Self-pay | Admitting: Internal Medicine

## 2019-08-14 NOTE — Telephone Encounter (Signed)
Advise patient, too early, he is supposed to have enough until 09/24/2019. I was unable to obtain PDMP

## 2019-08-14 NOTE — Telephone Encounter (Signed)
Hydrocodone refill.   Last OV: 06/26/2019 Last Fill: 06/19/2019 #90 and 0RF Pt sig: 1 tab qhs  UDS: 06/27/2019 Low risk

## 2019-08-17 NOTE — Telephone Encounter (Signed)
PT states that he only got a 30 day supply of HYDROcodone-acetaminophen (NORCO/VICODIN) 5-325 MG   back in January. His last pill will be taken on This coming Sunday.  Please contact patient to advise

## 2019-08-18 MED ORDER — HYDROCODONE-ACETAMINOPHEN 5-325 MG PO TABS: 1.0000 | ORAL_TABLET | Freq: Every day | ORAL | 0 refills | Status: DC

## 2019-08-18 NOTE — Telephone Encounter (Signed)
PDMP reviewed, was dispensed number 30 tablets of hydrocodone 07/17/2019. We will send two prescription for 30 tablets each for March and April.

## 2019-08-18 NOTE — Telephone Encounter (Signed)
Patient notified that rx has been sent in. 

## 2019-08-28 ENCOUNTER — Other Ambulatory Visit: Payer: Self-pay | Admitting: Internal Medicine

## 2019-08-28 DIAGNOSIS — E785 Hyperlipidemia, unspecified: Secondary | ICD-10-CM

## 2019-09-21 ENCOUNTER — Encounter: Payer: Self-pay | Admitting: Internal Medicine

## 2019-09-21 ENCOUNTER — Ambulatory Visit: Payer: Medicare HMO | Admitting: *Deleted

## 2019-09-21 ENCOUNTER — Ambulatory Visit (INDEPENDENT_AMBULATORY_CARE_PROVIDER_SITE_OTHER): Payer: Medicare HMO | Admitting: Internal Medicine

## 2019-09-21 ENCOUNTER — Other Ambulatory Visit: Payer: Self-pay

## 2019-09-21 VITALS — BP 129/84 | HR 64 | Temp 97.6°F | Resp 18 | Ht 78.0 in | Wt 271.5 lb

## 2019-09-21 DIAGNOSIS — I10 Essential (primary) hypertension: Secondary | ICD-10-CM

## 2019-09-21 DIAGNOSIS — F341 Dysthymic disorder: Secondary | ICD-10-CM

## 2019-09-21 DIAGNOSIS — M545 Low back pain, unspecified: Secondary | ICD-10-CM

## 2019-09-21 DIAGNOSIS — G8929 Other chronic pain: Secondary | ICD-10-CM | POA: Diagnosis not present

## 2019-09-21 DIAGNOSIS — F17200 Nicotine dependence, unspecified, uncomplicated: Secondary | ICD-10-CM

## 2019-09-21 DIAGNOSIS — E785 Hyperlipidemia, unspecified: Secondary | ICD-10-CM

## 2019-09-21 DIAGNOSIS — Z122 Encounter for screening for malignant neoplasm of respiratory organs: Secondary | ICD-10-CM

## 2019-09-21 DIAGNOSIS — E118 Type 2 diabetes mellitus with unspecified complications: Secondary | ICD-10-CM

## 2019-09-21 MED ORDER — HYDROCODONE-ACETAMINOPHEN 5-325 MG PO TABS: 1.0000 | ORAL_TABLET | Freq: Every day | ORAL | 0 refills | Status: DC

## 2019-09-21 MED ORDER — ALPRAZOLAM 0.5 MG PO TABS: 0.5000 mg | ORAL_TABLET | Freq: Two times a day (BID) | ORAL | 0 refills | Status: DC | PRN

## 2019-09-21 NOTE — Assessment & Plan Note (Signed)
  Diabetes: Last A1c 6.8, diet controlled.  Recheck on RTC High cholesterol: Continue Lipitor, Zetia, last LDL well controlled Anxiety, depression: Grieving the loss of his wife, did group therapy and it helped significantly.  Feeling better.  Extensive listening therapy provided Pain management: Most of the pain the shoulders, symptoms decrease temporarily with local injections, after that he takes hydrocodone and Soma as needed DJD: As above Tobacco abuse: Ongoing, counseled COPD: New diagnosis per CT 12-2017.  No symptoms currently. RTC 3 to 4 months

## 2019-09-21 NOTE — Progress Notes (Signed)
Pre visit review using our clinic review tool, if applicable. No additional management support is needed unless otherwise documented below in the visit note. 

## 2019-09-21 NOTE — Patient Instructions (Addendum)
Please schedule Medicare Wellness with Glenard Haring.     GO TO THE LAB : Get the blood work     GO TO THE FRONT DESK, [place] Come back for   a physical exam in 4 months

## 2019-09-21 NOTE — Progress Notes (Signed)
Nurse connected with patient 09/22/19 at  1:00 PM EDT by a telephone enabled telemedicine application and verified that I am speaking with the correct person using two identifiers. Patient stated full name and DOB. Patient gave permission to continue with virtual visit. Patient's location was at home and Nurse's location was at [facility] office.  Subjective:   Brendan [name] is a 69 y.o. male who presents for Medicare Annual/Subsequent preventive examination.  Review of Systems:  Home Safety/Smoke Alarms: Feels safe in home. Smoke alarms in place.  Lives w/dog in 1 story home.   Male:   CCS-  Next due 07/2024   PSA-  Lab Results  Component Value Date   PSA 0.53 06/26/2019   PSA 0.51 12/28/2014   PSA 0.42 06/22/2012       Objective:    Vitals: Unable to assess. This visit is enabled though telemedicine due to Covid 19.   Advanced Directives 09/22/2019 09/20/2018 05/25/2017 09/13/2016 05/15/2016 04/07/2015 03/11/2015  Does Patient Have a Medical Advance Directive? Yes Yes Yes Yes Yes Yes Yes  Type of Paramedic of [place];Living will [place];Living will [place];Living will [place];Living will Out of facility DNR (pink MOST or yellow form) - [place];Living will  Does patient want to make changes to medical advance directive? No - Patient declined No - Patient declined - - No - Patient declined - No - Patient declined  Copy of [place] in Chart? No - copy requested No - copy requested Yes No - copy requested - - No - copy requested    Tobacco Social History   Tobacco Use  Smoking Status Current Every Day Smoker  . Packs/day: 0.50  . Years: 40.00  . Pack years: 20.00  . Types: Cigars  . Start date: 1970  Smokeless Tobacco Never Used     Ready to quit: No Counseling given: No   Clinical Intake: Pain : No/denies pain     Past Medical History:   Diagnosis Date  . Allergy   . Anxiety and depression   . Arthritis   . Atrial flutter [facility])    New diagnosis November 10, 2010, rate controlled  . B12 deficiency   . Blurred vision    episode with confusion, evaluated Dr. Erling Cruz  . BPH (benign prostatic hypertrophy)   . CAD (coronary artery disease)    stent, 2001 / nuclear, June, 2010, no ischemia  . Diverticulosis of colon   . Dyslipidemia   . ED (erectile dysfunction)   . Ejection fraction   . GERD (gastroesophageal reflux disease)    pt denies, take omeprazole d/t taking several medications not GERD per pt  . History of colonic polyps   . HTN (hypertension)   . Lumbar back pain   . Obesity   . OSA (obstructive sleep apnea)   . PAF (paroxysmal atrial fibrillation) ([place])   . Peripheral neuropathy    lower extremities  . Prediabetes   . Pulmonary nodule   . Scalp lesion    November, 2011  . Sinus bradycardia   . Stroke ([place])    tia  . Tobacco use    hx of  . Vitamin D deficiency    Past Surgical History:  Procedure Laterality Date  . arm fracture surgery     remotely  . cardica stent x2    . CLAVICLE SURGERY     remotely  . TONSILLECTOMY  Family History  Problem Relation Age of Onset  . Stroke Mother   . Alzheimer's disease Mother        Jerilynn Mages, brother   . Heart failure Father   . Stroke Father   . Alzheimer's disease Paternal Aunt   . Colon cancer Neg Hx   . Esophageal cancer Neg Hx   . Rectal cancer Neg Hx   . Stomach cancer Neg Hx   . Prostate cancer Neg Hx    Social History   Socioeconomic History  . Marital status: Widowed    Spouse name: Not on file  . Number of children: 0  . Years of education: Not on file  . Highest education level: Not on file  Occupational History  . Occupation: retired---[employer]    Employer: [employer]  Tobacco Use  . Smoking status: Current Every Day Smoker    Packs/day: 0.50    Years: 40.00    Pack years: 20.00    Types: Cigars    Start date: 75  .  Smokeless tobacco: Never Used  Substance and Sexual Activity  . Alcohol use: Yes    Alcohol/week: 5.0 standard drinks    Types: 5 Standard drinks or equivalent per week    Comment: rare  . Drug use: No  . Sexual activity: Not Currently  Other Topics Concern  . Not on file  Social History Narrative   Wife had muscle dystrophy, lost wife ~10/2017   Now lives in a smaller house, by himself    Social Determinants of Health   Financial Resource Strain: Low Risk   . Difficulty of Paying Living Expenses: Not hard at all  Food Insecurity: No Food Insecurity  . Worried About Charity fundraiser in the Last Year: Never true  . Ran Out of Food in the Last Year: Never true  Transportation Needs: No Transportation Needs  . Lack of Transportation (Medical): No  . Lack of Transportation (Non-Medical): No  Physical Activity:   . Days of Exercise per Week:   . Minutes of Exercise per Session:   Stress:   . Feeling of Stress :   Social Connections:   . Frequency of Communication with Friends and Family:   . Frequency of Social Gatherings with Friends and Family:   . Attends Religious Services:   . Active Member of Clubs or Organizations:   . Attends Archivist Meetings:   [name] Kitchen Marital Status:     Outpatient Encounter Medications as of 09/22/2019  Medication Sig  . ALPRAZolam (XANAX) 0.5 MG tablet Take 1 tablet (0.5 mg total) by mouth 2 (two) times daily as needed for anxiety.  . Ascorbic Acid (VITAMIN C) 500 MG CAPS Take by mouth.  [name] Kitchen atorvastatin (LIPITOR) 80 MG tablet Take 1 tablet (80 mg total) by mouth daily.  . calcium carbonate (OSCAL) 1500 (600 Ca) MG TABS tablet Take 600 mg of elemental calcium by mouth daily with breakfast.  . carisoprodol (SOMA) 350 MG tablet TAKE ONE TABLET BY MOUTH THREE TIMES A DAY AS NEEDED FOR MUSCLE SPASMS  . chlorpheniramine (EQ CHLORTABS) 4 MG tablet Take 8 mg by mouth daily as needed for allergies or rhinitis.  . Cholecalciferol (VITAMIN D3 PO) Take  5,000 Units by mouth daily.  . diclofenac sodium (VOLTAREN) 1 % GEL Apply 4 g topically 4 (four) times daily as needed.  . ezetimibe (ZETIA) 10 MG tablet Take 1 tablet (10 mg total) by mouth daily.  [name] Kitchen FLUoxetine (PROZAC) 20 MG capsule  Take 1 capsule (20 mg total) by mouth daily.  [name] Kitchen glucose blood test strip Use as instructed  . HYDROcodone-acetaminophen (NORCO/VICODIN) 5-325 MG tablet Take 1 tablet by mouth at bedtime.  . metoprolol succinate (TOPROL-XL) 50 MG 24 hr tablet TAKE 1 AND 1/2 TABLETS ONE TIME DAILY WITH OR IMMEDIATELY FOLLOWING A MEAL  . Multiple Vitamin (MULTIVITAMIN) tablet Take 1 tablet by mouth daily. Calcium, Magnesium, Zinc  . omeprazole (PRILOSEC) 20 MG capsule Take 1 capsule (20 mg total) by mouth daily.  [name] Kitchen oxybutynin (DITROPAN-XL) 10 MG 24 hr tablet Take 10 mg by mouth at bedtime.  . rivaroxaban (XARELTO) 20 MG TABS tablet Take 1 tablet (20 mg total) by mouth daily with supper.  . sildenafil (VIAGRA) 100 MG tablet Take 100 mg by mouth daily as needed for erectile dysfunction.  . [DISCONTINUED] ALPRAZolam (XANAX) 0.5 MG tablet Take 1 tablet (0.5 mg total) by mouth 2 (two) times daily as needed for anxiety.  . [DISCONTINUED] HYDROcodone-acetaminophen (NORCO/VICODIN) 5-325 MG tablet Take 1 tablet by mouth at bedtime.   No facility-administered encounter medications on file as of 09/22/2019.    Activities of Daily Living In your present state of health, do you have any difficulty performing the following activities: 09/22/2019 09/21/2019  Hearing? N N  Vision? N N  Difficulty concentrating or making decisions? N N  Walking or climbing stairs? N N  Dressing or bathing? N N  Doing errands, shopping? N N  Preparing Food and eating ? N -  Using the Toilet? N -  In the past six months, have you accidently leaked urine? N -  Do you have problems with loss of bowel control? N -  Managing your Medications? N -  Managing your Finances? N -  Housekeeping or managing your  Housekeeping? N -  Some recent data might be hidden    Patient Care Team: Colon Branch, MD as PCP - General (Internal Medicine) Rigoberto Noel, MD as Consulting Physician (Pulmonary Disease) Franchot Gallo, MD as Consulting Physician (Urology) Ladene Artist, MD as Consulting Physician (Gastroenterology) Stanford Breed Denice Bors, MD as Consulting Physician (Cardiology) Shawnie Dapper, DO as Consulting Physician (Optometry)   Assessment:   This is a routine wellness examination for Richad. Physical assessment deferred to PCP.  Exercise Activities and Dietary recommendations Current Exercise Habits: Home exercise routine, Time (Minutes): 10, Frequency (Times/Week): 7, Weekly Exercise (Minutes/Week): 70, Intensity: Mild, Exercise limited by: None identified Diet (meal preparation, eat out, water intake, caffeinated beverages, dairy products, fruits and vegetables): well balanced   Goals    . Weight (lb) < 250 lb (113.4 kg)       Fall Risk Fall Risk  09/22/2019 09/21/2019 09/20/2018 04/28/2018 05/25/2017  Falls in the past year? 0 0 0 0 No  Comment - - - Emmi Telephone Survey: data to providers prior to load -  Number falls in past yr: 0 0 - - -  Injury with Fall? 0 0 - - -  Follow up Education provided;Falls prevention discussed Falls evaluation completed - - -   Depression Screen PHQ 2/9 Scores 06/26/2019 09/20/2018 08/17/2018 05/25/2017  PHQ - 2 Score 0 1 4 1   PHQ- 9 Score 5 - 15 -    Cognitive Function Ad8 score reviewed for issues:  Issues making decisions:no  Less interest in hobbies / activities:no  Repeats questions, stories (family complaining):no  Trouble using ordinary gadgets (microwave, computer, phone):no  Forgets the month or year: no  Mismanaging finances: no  Remembering  appts:no  Daily problems with thinking and/or memory:no Ad8 score is=0     MMSE - Mini Mental State Exam 05/25/2017 05/15/2016  Orientation to time 5 5  Orientation to Place 5 5   Registration 3 3  Attention/ Calculation 5 5  Recall 3 3  Language- name 2 objects 2 2  Language- repeat 1 1  Language- follow 3 step command 3 3  Language- read & follow direction 1 1  Write a sentence 1 1  Copy design 1 1  Total score 30 30        Immunization History  Administered Date(s) Administered  . Fluad Quad(high Dose 65+) 02/10/2019  . Influenza Split 06/05/2012, 06/20/2013, 04/10/2014  . Influenza Whole 04/08/2009, 04/22/2010, 02/11/2011  . Influenza, High Dose Seasonal PF 03/02/2016  . Influenza-Unspecified 03/28/2015, 03/17/2017, 03/10/2018  . PFIZER SARS-COV-2 Vaccination 06/29/2019, 07/19/2019  . Pneumococcal Conjugate-13 04/27/2014  . Pneumococcal Polysaccharide-23 05/19/2010  . Td 05/19/2010  . Zoster 12/25/2013  . Zoster Recombinat (Shingrix) 04/06/2019, 06/18/2019    Screening Tests Health Maintenance  Topic Date Due  . PNA vac Low Risk Adult (2 of 2 - PPSV23) 09/19/2024 (Originally 05/20/2015)  . INFLUENZA VACCINE  01/07/2020  . HEMOGLOBIN A1C  03/22/2020  . TETANUS/TDAP  05/19/2020  . FOOT EXAM  06/25/2020  . OPHTHALMOLOGY EXAM  07/09/2020  . URINE MICROALBUMIN  09/20/2020  . COLONOSCOPY  07/26/2024  . Hepatitis C Screening  Completed     Plan:    Please schedule your next medicare wellness visit with me in 1 yr.  Continue to eat heart healthy diet (full of fruits, vegetables, whole grains, lean protein, water--limit salt, fat, and sugar intake) and increase physical activity as tolerated.  Continue doing brain stimulating activities (puzzles, reading, adult coloring books, staying active) to keep memory sharp.    I have personally reviewed and noted the following in the patient's chart:   . Medical and social history . Use of alcohol, tobacco or illicit drugs  . Current medications and supplements . Functional ability and status . Nutritional status . Physical activity . Advanced directives . List of other physicians .  Hospitalizations, surgeries, and ER visits in previous 12 months . Vitals . Screenings to include cognitive, depression, and falls . Referrals and appointments  In addition, I have reviewed and discussed with patient certain preventive protocols, quality metrics, and best practice recommendations. A written personalized care plan for preventive services as well as general preventive health recommendations were provided to patient.     Naaman Plummer [place], [place]  09/22/2019

## 2019-09-21 NOTE — Progress Notes (Signed)
Subjective:    Patient ID: Brendan [name], male    DOB: Sep 04, 1950, 69 y.o.   MRN: [id number]  DOS:  09/21/2019 Type of visit - description: Follow-up Today we talk about pain management, anxiety, CAD. The last time he was here, he had a headache, that resolved.   Review of Systems Currently with no chest pain no difficulty breathing, no edema.  Past Medical History:  Diagnosis Date  . Allergy   . Anxiety and depression   . Arthritis   . Atrial flutter [facility])    New diagnosis November 10, 2010, rate controlled  . B12 deficiency   . Blurred vision    episode with confusion, evaluated Dr. Erling Cruz  . BPH (benign prostatic hypertrophy)   . CAD (coronary artery disease)    stent, 2001 / nuclear, June, 2010, no ischemia  . Diverticulosis of colon   . Dyslipidemia   . ED (erectile dysfunction)   . Ejection fraction   . GERD (gastroesophageal reflux disease)    pt denies, take omeprazole d/t taking several medications not GERD per pt  . History of colonic polyps   . HTN (hypertension)   . Lumbar back pain   . Obesity   . OSA (obstructive sleep apnea)   . PAF (paroxysmal atrial fibrillation) ([place])   . Peripheral neuropathy    lower extremities  . Prediabetes   . Pulmonary nodule   . Scalp lesion    November, 2011  . Sinus bradycardia   . Stroke ([place])    tia  . Tobacco use    hx of  . Vitamin D deficiency     Past Surgical History:  Procedure Laterality Date  . arm fracture surgery     remotely  . cardica stent x2    . CLAVICLE SURGERY     remotely  . TONSILLECTOMY      Allergies as of 09/21/2019      Reactions   Tussionex Pennkinetic Er [hydrocod Polst-cpm Polst Er] Hives   Albuterol Palpitations   Qvar [beclomethasone] Palpitations      Medication List       Accurate as of September 21, 2019 11:59 PM. If you have any questions, ask your nurse or doctor.        ALPRAZolam 0.5 MG tablet Commonly known as: XANAX Take 1 tablet (0.5 mg total) by mouth 2 (two)  times daily as needed for anxiety.   atorvastatin 80 MG tablet Commonly known as: LIPITOR Take 1 tablet (80 mg total) by mouth daily.   calcium carbonate 1500 (600 Ca) MG Tabs tablet Commonly known as: OSCAL Take 600 mg of elemental calcium by mouth daily with breakfast.   carisoprodol 350 MG tablet Commonly known as: SOMA TAKE ONE TABLET BY MOUTH THREE TIMES A DAY AS NEEDED FOR MUSCLE SPASMS   diclofenac sodium 1 % Gel Commonly known as: VOLTAREN Apply 4 g topically 4 (four) times daily as needed.   EQ Chlortabs 4 MG tablet Generic drug: chlorpheniramine Take 8 mg by mouth daily as needed for allergies or rhinitis.   ezetimibe 10 MG tablet Commonly known as: ZETIA Take 1 tablet (10 mg total) by mouth daily.   FLUoxetine 20 MG capsule Commonly known as: PROZAC Take 1 capsule (20 mg total) by mouth daily.   glucose blood test strip Use as instructed   HYDROcodone-acetaminophen 5-325 MG tablet Commonly known as: NORCO/VICODIN Take 1 tablet by mouth at bedtime.   metoprolol succinate 50 MG 24 hr tablet Commonly  known as: TOPROL-XL TAKE 1 AND 1/2 TABLETS ONE TIME DAILY WITH OR IMMEDIATELY FOLLOWING A MEAL   multivitamin tablet Take 1 tablet by mouth daily. Calcium, Magnesium, Zinc   omeprazole 20 MG capsule Commonly known as: PRILOSEC Take 1 capsule (20 mg total) by mouth daily.   oxybutynin 10 MG 24 hr tablet Commonly known as: DITROPAN-XL Take 10 mg by mouth at bedtime.   rivaroxaban 20 MG Tabs tablet Commonly known as: Xarelto Take 1 tablet (20 mg total) by mouth daily with supper.   sildenafil 100 MG tablet Commonly known as: VIAGRA Take 100 mg by mouth daily as needed for erectile dysfunction.   Vitamin C 500 MG Caps Take by mouth.   VITAMIN D3 PO Take 5,000 Units by mouth daily.          Objective:   Physical Exam BP 129/84 (BP Location: Left Arm, Patient Position: Sitting, Cuff Size: Normal)   Pulse 64   Temp 97.6 F (36.4 C) (Temporal)    Resp 18   Ht 6\' 6"  (1.981 m)   Wt 271 lb 8 oz (123.2 kg)   SpO2 97%   BMI 31.37 kg/m  General:   Well developed, NAD, BMI noted.  HEENT:  Normocephalic . Face symmetric, atraumatic Lungs:  CTA B Normal respiratory effort, no intercostal retractions, no accessory muscle use. Heart: RRR,  no murmur.  Abdomen:  Not distended, soft, non-tender. No rebound or rigidity.   Skin: Not pale. Not jaundice Lower extremities: no pretibial edema bilaterally  Neurologic:  alert & oriented X3.  Speech normal, gait appropriate for age and unassisted Psych--  Cognition and judgment appear intact.  Cooperative with normal attention span and concentration.  Behavior appropriate. No anxious or depressed appearing.     Assessment   Assessment DM (A1C increased to 6.8 on 12/2017) Peripheral neuropathy (no pain, insensitive type) HTN Hyperlipidemia Anxiety depression -- on xanax  Chronic rhinitis; Dr Thornell Mule, ENT CV: Dr Stanford Breed --CAD. Stress test okay 05-2015 -- Paroxysmal Atrial fibrillation --TIA --Abdominal bruit: No AAA. Ultrasound 05/2015 PULM:  OSA - + sleep study 03-2015 ,RX Cpap  intolerant   COPD: smoker, CT chest 12/2017 :changes noted   GU: BPH, Hypogonadism, ED. Sees urology B12 deficiency H/o pulmonary nodule, CXR 3-16 stable, no further schedule imaging Pain mngmt: has back-shoulder  pain and neuropathy  On Soma and Vicodin. Etodolac DC 2016  per Dr Larose Kells d/t concerns regards anticoagulation. Hydrocodone dose needed to be increased Has seen Dr. Erling Cruz several times since 2010:w/u  included SPE, ACE, TSH, B12 (slightly low with a normal methylmalonic acid) 08-2008 had a lumbar spine MRI with multilevel spondylosis. Dr. 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   Check BMP, CBC Anxiety, depression: On fluoxetine, refill Xanax CAD: Next visit with cardiology 12-2019.  Asymptomatic. Paroxysmal A. fib: Rate controlled, anticoagulated.  Asymptomatic. Tobacco abuse: Counseled at, smoking less but not ready to quit. DJD, pain management: Refill hydrocodone. Headache: See last visit, MRI normal for age, symptoms resolved. BPH: Per urology. Screening, lung CA:  order the CT chest RTC CPX 4 months   This visit occurred during the SARS-CoV-2 public health emergency.  Safety protocols were in place, including screening questions prior to the visit, additional usage of staff PPE, and extensive cleaning of exam  room while observing appropriate contact time as indicated for disinfecting solutions.

## 2019-09-22 ENCOUNTER — Ambulatory Visit: Payer: Medicare HMO | Admitting: Internal Medicine

## 2019-09-22 ENCOUNTER — Encounter: Payer: Self-pay | Admitting: *Deleted

## 2019-09-22 ENCOUNTER — Ambulatory Visit (INDEPENDENT_AMBULATORY_CARE_PROVIDER_SITE_OTHER): Payer: Medicare HMO | Admitting: *Deleted

## 2019-09-22 DIAGNOSIS — Z0000 Encounter for general adult medical examination without abnormal findings: Secondary | ICD-10-CM | POA: Diagnosis not present

## 2019-09-22 LAB — BASIC METABOLIC PANEL
BUN: 14 mg/dL (ref 6–23)
CO2: 25 mEq/L (ref 19–32)
Calcium: 9.5 mg/dL (ref 8.4–10.5)
Chloride: 103 mEq/L (ref 96–112)
Creatinine, Ser: 0.8 mg/dL (ref 0.40–1.50)
GFR: 95.41 mL/min (ref >60.00)
Glucose, Bld: 108 mg/dL — ABNORMAL HIGH (ref 70–99)
Potassium: 4.4 mEq/L (ref 3.5–5.1)
Sodium: 137 mEq/L (ref 135–145)

## 2019-09-22 LAB — CBC WITH DIFFERENTIAL/PLATELET
Basophils Absolute: 0.1 10*3/uL (ref 0.0–0.1)
Basophils Relative: 1 % (ref 0.0–3.0)
Eosinophils Absolute: 0.2 10*3/uL (ref 0.0–0.7)
Eosinophils Relative: 3.6 % (ref 0.0–5.0)
HCT: 48.4 % (ref 39.0–52.0)
Hemoglobin: 16.8 g/dL (ref 13.0–17.0)
Lymphocytes Relative: 31.5 % (ref 12.0–46.0)
Lymphs Abs: 2 10*3/uL (ref 0.7–4.0)
MCHC: 34.6 g/dL (ref 30.0–36.0)
MCV: 93.8 fl (ref 78.0–100.0)
Monocytes Absolute: 0.4 10*3/uL (ref 0.1–1.0)
Monocytes Relative: 6.9 % (ref 3.0–12.0)
Neutro Abs: 3.6 10*3/uL (ref 1.4–7.7)
Neutrophils Relative %: 57 % (ref 43.0–77.0)
Platelets: 205 10*3/uL (ref 150.0–400.0)
RBC: 5.16 Mil/uL (ref 4.22–5.81)
RDW: 13.2 % (ref 11.5–15.5)
WBC: 6.3 10*3/uL (ref 4.0–10.5)

## 2019-09-22 LAB — MICROALBUMIN / CREATININE URINE RATIO
Creatinine,U: 95.6 mg/dL
Microalb Creat Ratio: 0.7 mg/g (ref 0.0–30.0)
Microalb, Ur: <0.7 mg/dL (ref 0.0–1.9)

## 2019-09-22 LAB — HEMOGLOBIN A1C: Hgb A1c MFr Bld: 6.2 % (ref 4.6–6.5)

## 2019-09-22 NOTE — Patient Instructions (Signed)
Please schedule your next medicare wellness visit with me in 1 yr.  Continue to eat heart healthy diet (full of fruits, vegetables, whole grains, lean protein, water--limit salt, fat, and sugar intake) and increase physical activity as tolerated.  Continue doing brain stimulating activities (puzzles, reading, adult coloring books, staying active) to keep memory sharp.    [name] , Thank you for taking time to come for your Medicare Wellness Visit. I appreciate your ongoing commitment to your health goals. Please review the following plan we discussed and let me know if I can assist you in the future.   These are the goals we discussed: Goals    . Weight (lb) < 250 lb (113.4 kg)       This is a list of the screening recommended for you and due dates:  Health Maintenance  Topic Date Due  . Pneumonia vaccines (2 of 2 - PPSV23) 09/19/2024*  . Flu Shot  01/07/2020  . Hemoglobin A1C  03/22/2020  . Tetanus Vaccine  05/19/2020  . Complete foot exam   06/25/2020  . Eye exam for diabetics  07/09/2020  . Urine Protein Check  09/20/2020  . Colon Cancer Screening  07/26/2024  .  Hepatitis C: One time screening is recommended by Center for Disease Control  (CDC) for  adults born from 1945 through 1965.   Completed  *Topic was postponed. The date shown is not the original due date.    Preventive Care 65 Years and Older, Male Preventive care refers to lifestyle choices and visits with your health care provider that can promote health and wellness. This includes:  A yearly physical exam. This is also called an annual well check.  Regular dental and eye exams.  Immunizations.  Screening for certain conditions.  Healthy lifestyle choices, such as diet and exercise. What can I expect for my preventive care visit? Physical exam Your health care provider will check:  Height and weight. These may be used to calculate body mass index (BMI), which is a measurement that tells if you are at a  healthy weight.  Heart rate and blood pressure.  Your skin for abnormal spots. Counseling Your health care provider may ask you questions about:  Alcohol, tobacco, and drug use.  Emotional well-being.  Home and relationship well-being.  Sexual activity.  Eating habits.  History of falls.  Memory and ability to understand (cognition).  Work and work environment. What immunizations do I need?  Influenza (flu) vaccine  This is recommended every year. Tetanus, diphtheria, and pertussis (Tdap) vaccine  You may need a Td booster every 10 years. Varicella (chickenpox) vaccine  You may need this vaccine if you have not already been vaccinated. Zoster (shingles) vaccine  You may need this after age 60. Pneumococcal conjugate (PCV13) vaccine  One dose is recommended after age 71. Pneumococcal polysaccharide (PPSV23) vaccine  One dose is recommended after age 71. Measles, mumps, and rubella (MMR) vaccine  You may need at least one dose of MMR if you were born in 1957 or later. You may also need a second dose. Meningococcal conjugate (MenACWY) vaccine  You may need this if you have certain conditions. Hepatitis A vaccine  You may need this if you have certain conditions or if you travel or work in places where you may be exposed to hepatitis A. Hepatitis B vaccine  You may need this if you have certain conditions or if you travel or work in places where you may be exposed to hepatitis   B. Haemophilus influenzae type b (Hib) vaccine  You may need this if you have certain conditions. You may receive vaccines as individual doses or as more than one vaccine together in one shot (combination vaccines). Talk with your health care provider about the risks and benefits of combination vaccines. What tests do I need? Blood tests  Lipid and cholesterol levels. These may be checked every 5 years, or more frequently depending on your overall health.  Hepatitis C  test.  Hepatitis B test. Screening  Lung cancer screening. You may have this screening every year starting at age 55 if you have a 30-pack-year history of smoking and currently smoke or have quit within the past 15 years.  Colorectal cancer screening. All adults should have this screening starting at age 50 and continuing until age 75. Your health care provider may recommend screening at age 45 if you are at increased risk. You will have tests every 1-10 years, depending on your results and the type of screening test.  Prostate cancer screening. Recommendations will vary depending on your family history and other risks.  Diabetes screening. This is done by checking your blood sugar (glucose) after you have not eaten for a while (fasting). You may have this done every 1-3 years.  Abdominal aortic aneurysm (AAA) screening. You may need this if you are a current or former smoker.  Sexually transmitted disease (STD) testing. Follow these instructions at home: Eating and drinking  Eat a diet that includes fresh fruits and vegetables, whole grains, lean protein, and low-fat dairy products. Limit your intake of foods with high amounts of sugar, saturated fats, and salt.  Take vitamin and mineral supplements as recommended by your health care provider.  Do not drink alcohol if your health care provider tells you not to drink.  If you drink alcohol: ? Limit how much you have to 0-2 drinks a day. ? Be aware of how much alcohol is in your drink. In the [place] one drink equals one 12 oz bottle of beer (355 mL), one 5 oz glass of wine (148 mL), or one 1 oz glass of hard liquor (44 mL). Lifestyle  Take daily care of your teeth and gums.  Stay active. Exercise for at least 30 minutes on 5 or more days each week.  Do not use any products that contain nicotine or tobacco, such as cigarettes, e-cigarettes, and chewing tobacco. If you need help quitting, ask your health care provider.  If you are  sexually active, practice safe sex. Use a condom or other form of protection to prevent STIs (sexually transmitted infections).  Talk with your health care provider about taking a low-dose aspirin or statin. What's next?  Visit your health care provider once a year for a well check visit.  Ask your health care provider how often you should have your eyes and teeth checked.  Stay up to date on all vaccines. This information is not intended to replace advice given to you by your health care provider. Make sure you discuss any questions you have with your health care provider. Document Revised: 05/19/2018 Document Reviewed: 05/19/2018 Elsevier Patient Education  2020 [employer]vier Inc[employer]  

## 2019-09-24 NOTE — Assessment & Plan Note (Addendum)
DM: Diet controlled, check A1c and micro HTN: BP seems controlled, on metoprolol.  Check BMP, CBC Anxiety, depression: On fluoxetine, refill Xanax CAD: Next visit with cardiology 12-2019.  Asymptomatic. Paroxysmal A. fib: Rate controlled, anticoagulated.  Asymptomatic. Tobacco abuse: Counseled at, smoking less but not ready to quit. DJD, pain management: Refill hydrocodone. Headache: See last visit, MRI normal for age, symptoms resolved. BPH: Per urology. Screening, lung CA:  order the CT chest RTC CPX 4 months

## 2019-10-12 ENCOUNTER — Telehealth: Payer: Self-pay | Admitting: Internal Medicine

## 2019-10-12 NOTE — Telephone Encounter (Signed)
Slightly early refill but PDMP okay.  RF sent

## 2019-10-18 ENCOUNTER — Telehealth: Payer: Self-pay | Admitting: Internal Medicine

## 2019-10-18 NOTE — Progress Notes (Signed)
  Chronic Care Management   Note  10/18/2019 Name: MARKLEY MUJICA MRN: [id number] DOB: 1951-04-13  Belinda Block Weidert is a 69 y.o. year old male who is a primary care patient of Larose Kells, Alda Berthold, MD. I reached out to [name] by phone today in response to a referral sent by Mr. Belinda Block Haupert's PCP, Colon Branch, MD.   Mr. Grayer was given information about Chronic Care Management services today including:  1. CCM service includes personalized support from designated clinical staff supervised by his physician, including individualized plan of care and coordination with other care providers 2. 24/7 contact phone numbers for assistance for urgent and routine care needs. 3. Service will only be billed when office clinical staff spend 20 minutes or more in a month to coordinate care. 4. Only one practitioner may furnish and bill the service in a calendar month. 5. The patient may stop CCM services at any time (effective at the end of the month) by phone call to the office staff.   Patient agreed to services and verbal consent obtained.   This note is not being shared with the patient for the following reason: To respect privacy (The patient or proxy has requested that the information not be shared).  Follow up plan:   Earney [place] [employer] Scheduler

## 2019-10-19 ENCOUNTER — Telehealth: Payer: Self-pay | Admitting: Internal Medicine

## 2019-10-19 DIAGNOSIS — G629 Polyneuropathy, unspecified: Secondary | ICD-10-CM

## 2019-10-19 DIAGNOSIS — G4452 New daily persistent headache (NDPH): Secondary | ICD-10-CM

## 2019-10-19 NOTE — Telephone Encounter (Signed)
C/o HA 06/2019, brain MRI was neg, sx resolved. He is anticoagulated  If HA have resurfaced, I recommend: ER if severe, different, have neck stiffness  or "worse HA of his life". Also if has any stroke like sxs He does have hydrocodone, to use prn Refer to neurology

## 2019-10-19 NOTE — Telephone Encounter (Signed)
Spoke w/ Pt- informed of recommendations. Pt verbalized understanding. Neurology referral placed to Dr. Jaynee Eagles.

## 2019-10-19 NOTE — Telephone Encounter (Signed)
Caller : Brendan [name]  Call Back # [phone]  Patient states that he is having a headache, patient states that he has spoken with Dr Larose Kells in regards to headache. Patient would like something for pain.    Please Advise

## 2019-10-19 NOTE — Telephone Encounter (Signed)
Please advise 

## 2019-10-23 ENCOUNTER — Telehealth: Payer: Self-pay | Admitting: Internal Medicine

## 2019-10-23 NOTE — Telephone Encounter (Signed)
Referral updated

## 2019-10-23 NOTE — Telephone Encounter (Signed)
Patient states he was referred to a Neurology for his headaches. Patient states he was told there would also be able to assist him with his feet neuropathy.  Patient was able to schedule an appointment with the Neurology for the headaches was told to contact his primary doctor so they could add to the order for them to see him also for his feet neuropathy. .  Patient phone number [phone]

## 2019-11-10 ENCOUNTER — Other Ambulatory Visit: Payer: Self-pay | Admitting: Internal Medicine

## 2019-11-10 DIAGNOSIS — I10 Essential (primary) hypertension: Secondary | ICD-10-CM

## 2019-11-20 ENCOUNTER — Telehealth: Payer: Self-pay

## 2019-11-20 DIAGNOSIS — F341 Dysthymic disorder: Secondary | ICD-10-CM

## 2019-11-20 MED ORDER — ALPRAZOLAM 0.5 MG PO TABS: 0.5000 mg | ORAL_TABLET | Freq: Two times a day (BID) | ORAL | 0 refills | Status: DC | PRN

## 2019-11-20 NOTE — Telephone Encounter (Signed)
Per PDMP, [facility] Rossetti is sending him 60 tablets at a time.  Last time it was dispensed to him was 10/27/2019. We will send #60, no refills.

## 2019-11-20 NOTE — Telephone Encounter (Signed)
Alprazolam refill.   Last OV: 09/21/2019 Last Fill: 09/21/2019 #180 and 0RF Pt sig: 1 tab bid prn UDS: 06/26/2019 Low risk

## 2019-11-27 DIAGNOSIS — M19011 Primary osteoarthritis, right shoulder: Secondary | ICD-10-CM | POA: Diagnosis not present

## 2019-12-01 ENCOUNTER — Other Ambulatory Visit: Payer: Self-pay | Admitting: Internal Medicine

## 2019-12-01 DIAGNOSIS — E785 Hyperlipidemia, unspecified: Secondary | ICD-10-CM

## 2019-12-04 NOTE — Chronic Care Management (AMB) (Deleted)
Chronic Care Management Pharmacy  Name: Brendan [name]  MRN: [id number] DOB: February 16, 1951   Chief Complaint/ HPI  Brendan [name],  69 y.o. , male presents for their Initial CCM visit with the clinical pharmacist via telephone due to COVID-19 Pandemic.  PCP : Colon Branch, MD  Their chronic conditions include: Hypertension, Hyperlipidemia, Diabetes, Atrial Fibrillation, Coronary Artery Disease, COPD, Anxiety, Tobacco use and BPH    Office Visits: 09/22/19: Patient presented to Naaman Plummer, RN for AWV.  09/21/19: Patient presented to Dr. Larose Kells for follow-up. No medication changes made.  06/26/19: Patient presented to Dr. Larose Kells for follow-up. Patient changed vitamin D   Consult Visit: 11/27/19: Patient received right shoulder injection.  07/11/19: Patient presented to Dr. Lazaro Arms (optometry)  07/10/19: patient presented to Dr. Jerline Pain for cataracts.      Allergies  Allergen Reactions  . Tussionex Pennkinetic Er [Hydrocod Polst-Cpm Polst Er] Hives  . Albuterol Palpitations  . Qvar [Beclomethasone] Palpitations    Medications: Outpatient Encounter Medications as of 12/05/2019  Medication Sig  . ALPRAZolam (XANAX) 0.5 MG tablet Take 1 tablet (0.5 mg total) by mouth 2 (two) times daily as needed for anxiety.  . Ascorbic Acid (VITAMIN C) 500 MG CAPS Take by mouth.  Marland Kitchen atorvastatin (LIPITOR) 80 MG tablet Take 1 tablet (80 mg total) by mouth daily.  . calcium carbonate (OSCAL) 1500 (600 Ca) MG TABS tablet Take 600 mg of elemental calcium by mouth daily with breakfast.  . carisoprodol (SOMA) 350 MG tablet TAKE ONE TABLET BY MOUTH THREE TIMES A DAY AS NEEDED FOR MUSCLE SPASMS  . chlorpheniramine (EQ CHLORTABS) 4 MG tablet Take 8 mg by mouth daily as needed for allergies or rhinitis.  . Cholecalciferol (VITAMIN D3 PO) Take 5,000 Units by mouth daily.  . diclofenac sodium (VOLTAREN) 1 % GEL Apply 4 g topically 4 (four) times daily as needed.  . ezetimibe (ZETIA) 10 MG tablet Take 1 tablet (10 mg  total) by mouth daily.  Marland Kitchen FLUoxetine (PROZAC) 20 MG capsule Take 1 capsule (20 mg total) by mouth daily.  Marland Kitchen glucose blood test strip Use as instructed  . HYDROcodone-acetaminophen (NORCO/VICODIN) 5-325 MG tablet TAKE 1 TABLET BY MOUTH AT BEDTIME  . metoprolol succinate (TOPROL-XL) 50 MG 24 hr tablet Take 1.5 tablets (75 mg total) by mouth daily. With or immediately following a meal.  . Multiple Vitamin (MULTIVITAMIN) tablet Take 1 tablet by mouth daily. Calcium, Magnesium, Zinc  . omeprazole (PRILOSEC) 20 MG capsule Take 1 capsule (20 mg total) by mouth daily.  Marland Kitchen oxybutynin (DITROPAN-XL) 10 MG 24 hr tablet Take 10 mg by mouth at bedtime.  . rivaroxaban (XARELTO) 20 MG TABS tablet Take 1 tablet (20 mg total) by mouth daily with supper.  . sildenafil (VIAGRA) 100 MG tablet Take 100 mg by mouth daily as needed for erectile dysfunction.   No facility-administered encounter medications on file as of 12/05/2019.     Current Diagnosis/Assessment:    Goals Addressed   None     AFIB   Patient is currently {CHL HP BP RATE/RHYTHM:[phone]} controlled. HR *** BPM  Patient has failed these meds in past: *** Patient is currently {CHL Controlled/Uncontrolled:[phone]} on the following medications: *** . Metoprolol XL 50 mg 1.5 tabs daily  . Xarelto 20 mg daily  We discussed:  {CHL HP Upstream Pharmacy discussion:[phone]}  Plan  Continue {CHL HP Upstream Pharmacy [phone]}   and  COPD    Last spirometry score: 91% (11/22/14)  Gold Grade: Gold 1 (  FEV1>80%) Current COPD Classification:  {CHL HP Upstream Pharm COPD Classification:[phone]}  Eosinophil count:   Lab Results  Component Value Date/Time   EOSPCT 3.6 09/21/2019 02:36 PM  %                               Eos (Absolute):  Lab Results  Component Value Date/Time   EOSABS 0.2 09/21/2019 02:36 PM    Tobacco Status:  Social History   Tobacco Use  Smoking Status Current Every Day Smoker  . Packs/day: 0.50    . Years: 40.00  . Pack years: 20.00  . Types: Cigars  . Start date: 1970  Smokeless Tobacco Never Used    Patient has failed these meds in past: *** Patient is currently {CHL Controlled/Uncontrolled:[id number]} on the following medications: None  Using maintenance inhaler regularly? {yes/no:20286} Frequency of rescue inhaler use:  {CHL HP Upstream Pharm Inhaler GOTL:[phone]}  We discussed:  {CHL HP Upstream Pharmacy discussion:[phone]}  Plan  Continue {CHL HP Upstream Pharmacy RCBUL:[id number]}   Tobacco Abuse   Tobacco Status:  Social History   Tobacco Use  Smoking Status Current Every Day Smoker  . Packs/day: 0.50  . Years: 40.00  . Pack years: 20.00  . Types: Cigars  . Start date: 1970  Smokeless Tobacco Never Used    Patient smokes {Time to first cigarette:23873} Patient triggers include: {Smoking Triggers:23882} On a scale of 1-10, reports MOTIVATION to quit is *** On a scale of 1-10, reports CONFIDENCE in quitting is ***  Patient has failed these meds in past: *** Patient is currently {CHL Controlled/Uncontrolled:6787172781} on the following medications:  . ***  We discussed:  {Smoking Cessation Counseling:23883}  Plan  Continue {CHL HP Upstream Pharmacy Plans:[phone]}   Hypertension   BP goal is:  {CHL HP UPSTREAM Pharmacist BP ranges:[id number]}  Office blood pressures are  BP Readings from Last 3 Encounters:  09/21/19 129/84  06/26/19 106/66  12/14/18 114/70   CMP Latest Ref Rng & Units 09/21/2019 06/26/2019 12/14/2018  Glucose 70 - 99 mg/dL 108(H) 111(H) 126(H)  BUN 6 - 23 mg/dL _0 Creatinine 0.40 - 1.50 mg/dL 0.80 0.83 0.88  Sodium 135 - 145 mEq/L 137 140 141  Potassium 3.5 - 5.1 mEq/L 4.4 4.9 4.6  Chloride 96 - 112 mEq/L 103 103 103  CO2 19 - 32 mEq/L _1 Calcium 8.4 - 10.5 mg/dL 9.5 9.7 9.8  Total Protein 6.0 - 8.3 g/dL - 6.4 6.4  Total Bilirubin 0.2 - 1.2 mg/dL - 0.8 0.5  Alkaline Phos 39 - 117 U/L - 77 97  AST  0 - 37 U/L - 19 18  ALT 0 - 53 U/L - 26 20   Patient checks BP at home {CHL HP BP Monitoring Frequency:[phone]} Patient home BP readings are ranging: ***  Patient has failed these meds in the past: *** Patient is currently {CHL Controlled/Uncontrolled:[id number]} on the following medications:  Marland Kitchen Metoprolol XL 50 mg 1.5 tabs daily   We discussed {CHL HP Upstream Pharmacy discussion:[phone]}  Plan  Continue {CHL HP Upstream Pharmacy Plans:[phone]}   Hyperlipidemia   LDL goal < ***  Lipid Panel     Component Value Date/Time   CHOL 128 12/14/2018 1120   TRIG 196 (H) 12/14/2018 1120   HDL 29 (L) 12/14/2018 1120   LDLCALC 60 12/14/2018 1120   LDLDIRECT 109.0 07/12/2017 1104    Hepatic Function Latest Ref Rng & Units 06/26/2019 12/14/2018  08/17/2018  Total Protein 6.0 - 8.3 g/dL 6.4 6.4 -  Albumin 3.5 - 5.2 g/dL 4.1 4.2 -  AST 0 - 37 U/L _0 ALT 0 - 53 U/L _1 Alk Phosphatase 39 - 117 U/L 77 97 -  Total Bilirubin 0.2 - 1.2 mg/dL 0.8 0.5 -  Bilirubin, Direct <=0.2 mg/dL - - -     The ASCVD Risk score Mikey Bussing DC Jr., et al., 2013) failed to calculate for the following reasons:   The valid total cholesterol range is 130 to 320 mg/dL   Patient has failed these meds in past: *** Patient is currently {CHL Controlled/[phone]} on the following medications:  . Atorvastatin 80 mg daily  . Ezetimibe 10 mg daily   We discussed:  {CHL HP Upstream Pharmacy discussion:[phone]}  Plan  Continue {CHL HP Upstream Pharmacy [phone]}  Depression / Anxiety   PHQ9 Score:  PHQ9 SCORE ONLY 09/22/2019 06/26/2019 09/20/2018  PHQ-9 Total Score 0 5 1   GAD7 Score: No flowsheet data found.  Patient has failed these meds in past: *** Patient is currently {CHL Controlled/[phone]} on the following medications:  . Alprazolam 0.5 mg BID PRN  . Fluoxetine 20 mg daily   We discussed:  ***  Plan  Continue {CHL HP Upstream Pharmacy  OZHYQ:[id number]}  BPH   PSA  Date Value Ref Range Status  06/26/2019 0.53 0.10 - 4.00 ng/mL Final    Comment:    Test performed using Access Hybritech PSA Assay, a parmagnetic partical, chemiluminecent immunoassay.  12/28/2014 0.51 0.10 - 4.00 ng/mL Final  06/22/2012 0.42 0.10 - 4.00 ng/mL Final     Patient has failed these meds in past: *** Patient is currently {CHL Controlled/[phone]} on the following medications:  . Oxybutynin XL 10 mg QHS   We discussed:  ***  Plan  Continue {CHL HP Upstream Pharmacy BMWUX:[id number]}   GERD   Patient has failed these meds in past: *** Patient is currently {CHL Controlled/[phone]} on the following medications:  . Omeprazole 20 mg daily   We discussed:  ***  Plan  Continue {CHL HP Upstream Pharmacy [phone]}  Chronic Pain   History of peripheral neuropathy  History of lumbar back pain   Patient has failed these meds in past: *** Patient is currently {CHL Controlled/[phone]} on the following medications:  . Carisoprodol 350 mg TID PRN  . Voltaren 1% gel QID PRN  . Hydrocodone-APAP 5-235 mg QHS   We discussed:  ***  Plan  Continue {CHL HP Upstream Pharmacy [phone]}     Misc / OTC    Patient has failed these meds in past: *** Patient is currently {CHL Controlled/[phone]} on the following medications:  Marland Kitchen Vitamin C 500 mg  . Calcium carbonate 600 mg daily  . Vitamin D 5000 units daily  . Multivitamin daily . Sildenafil 100 mg daily PRN  . Chlorpheniramine 4 mg 2 tabs daily PRN   We discussed:  ***  Plan  Continue {CHL HP Upstream Pharmacy ZDGUY:[id number]}  Vaccines   Reviewed and discussed patient's vaccination history.    Immunization History  Administered Date(s) Administered  . Fluad Quad(high Dose 65+) 02/10/2019  . Influenza Split 06/05/2012, 06/20/2013, 04/10/2014  . Influenza Whole 04/08/2009, 04/22/2010, 02/11/2011    . Influenza, High Dose Seasonal PF 03/02/2016  . Influenza-Unspecified 03/28/2015, 03/17/2017, 03/10/2018  . PFIZER SARS-COV-2 Vaccination 06/29/2019, 07/19/2019  . Pneumococcal Conjugate-13 04/27/2014  . Pneumococcal Polysaccharide-23 05/19/2010  . Td 05/19/2010  . Zoster 12/25/2013  . Zoster  Recombinat (Shingrix) 04/06/2019, 06/18/2019    Plan  Recommended patient receive *** vaccine in *** office.   Medication Management   Pt uses [facility] for all medications Uses pill box? {Yes or If no, why not?:20788} Pt endorses ***% compliance  We discussed: ***  Plan  {US Pharmacy SEGB:15176}   Follow up: *** month phone visit  ***

## 2019-12-05 ENCOUNTER — Ambulatory Visit: Payer: Medicare HMO | Admitting: Pharmacist

## 2019-12-05 ENCOUNTER — Other Ambulatory Visit: Payer: Self-pay

## 2019-12-05 DIAGNOSIS — R739 Hyperglycemia, unspecified: Secondary | ICD-10-CM

## 2019-12-05 DIAGNOSIS — E785 Hyperlipidemia, unspecified: Secondary | ICD-10-CM

## 2019-12-05 DIAGNOSIS — I10 Essential (primary) hypertension: Secondary | ICD-10-CM

## 2019-12-05 NOTE — Chronic Care Management (AMB) (Signed)
Chronic Care Management Pharmacy  Name: Brendan [name]  MRN: [id number] DOB: 02/23/1951   Chief Complaint/ HPI  Brendan [name],  69 y.o. , male presents for their Initial CCM visit with the clinical pharmacist via telephone due to COVID-19 Pandemic.  PCP : Brendan Branch, MD  Their chronic conditions include: Aflutter, Pre-Diabetes, COPD, Tobacco Use Disorder, Hypertension, Hyperlipidemia/CAD, Depression/Anxiety, Overactive Bladder, GERD, Chronic Pain  Office Visits: 09/22/19: Patient presented to Naaman Plummer, RN for AWV.   09/21/19: Patient presented to Dr. Larose Kells for follow-up. No medication changes made.   06/26/19: Patient presented to Dr. Larose Kells for follow-up. Patient changed vitamin D   Consult Visit: 11/27/19: Ortho visit w/ Dr. Lanae Boast - Patient received right shoulder injection (triamcinolone).   07/11/19: Patient presented to Dr. Lazaro Arms (optometry)   07/10/19: patient presented to Dr. Jerline Pain for cataracts.    Allergies  Allergen Reactions  . Tussionex Pennkinetic Er [Hydrocod Polst-Cpm Polst Er] Hives  . Albuterol Palpitations  . Qvar [Beclomethasone] Palpitations    Medications: Outpatient Encounter Medications as of 12/05/2019  Medication Sig Note  . ALPRAZolam (XANAX) 0.5 MG tablet Take 1 tablet (0.5 mg total) by mouth 2 (two) times daily as needed for anxiety.   Marland Kitchen atorvastatin (LIPITOR) 80 MG tablet Take 1 tablet (80 mg total) by mouth daily.   . carisoprodol (SOMA) 350 MG tablet TAKE ONE TABLET BY MOUTH THREE TIMES A Stefania Goulart AS NEEDED FOR MUSCLE SPASMS   . chlorpheniramine (EQ CHLORTABS) 4 MG tablet Take 8 mg by mouth daily as needed for allergies or rhinitis.   . Cholecalciferol (VITAMIN D3 PO) Take 5,000 Units by mouth daily.   . diclofenac sodium (VOLTAREN) 1 % GEL Apply 4 g topically 4 (four) times daily as needed.   . ezetimibe (ZETIA) 10 MG tablet Take 1 tablet (10 mg total) by mouth daily.   Marland Kitchen FLUoxetine (PROZAC) 20 MG capsule Take 1 capsule (20 mg total) by mouth  daily.   Marland Kitchen HYDROcodone-acetaminophen (NORCO/VICODIN) 5-325 MG tablet TAKE 1 TABLET BY MOUTH AT BEDTIME   . metoprolol succinate (TOPROL-XL) 50 MG 24 hr tablet Take 1.5 tablets (75 mg total) by mouth daily. With or immediately following a meal.   . Multiple Vitamin (MULTIVITAMIN) tablet Take 1 tablet by mouth daily. Calcium, Magnesium, Zinc   . omeprazole (PRILOSEC) 20 MG capsule Take 1 capsule (20 mg total) by mouth daily.   Marland Kitchen oxybutynin (DITROPAN XL) 15 MG 24 hr tablet Take 15 mg by mouth at bedtime. Increased from 39m by Urologist   . rivaroxaban (XARELTO) 20 MG TABS tablet Take 1 tablet (20 mg total) by mouth daily with supper.   . sildenafil (VIAGRA) 100 MG tablet Take 100 mg by mouth daily as needed for erectile dysfunction.   . Ascorbic Acid (VITAMIN C) 500 MG CAPS Take by mouth. (Patient not taking: Reported on 12/05/2019) 12/05/2019: Takes during Flu season  . calcium carbonate (OSCAL) 1500 (600 Ca) MG TABS tablet Take 600 mg of elemental calcium by mouth daily with breakfast. (Patient not taking: Reported on 12/05/2019) 12/05/2019: Feels he gets what he needs from multivitamin  . glucose blood test strip Use as instructed    No facility-administered encounter medications on file as of 12/05/2019.   SDOH Screenings   Alcohol Screen:   . Last Alcohol Screening Score (AUDIT):   Depression (PHQ2-9): Low Risk   . PHQ-2 Score: 0  Financial Resource Strain: Low Risk   . Difficulty of Paying Living Expenses: Not hard at  all  Food Insecurity: No Food Insecurity  . Worried About Charity fundraiser in the Last Year: Never true  . Ran Out of Food in the Last Year: Never true  Housing: Low Risk   . Last Housing Risk Score: 0  Physical Activity:   . Days of Exercise per Week:   . Minutes of Exercise per Session:   Social Connections:   . Frequency of Communication with Friends and Family:   . Frequency of Social Gatherings with Friends and Family:   . Attends Religious Services:   . Active  Member of Clubs or Organizations:   . Attends Archivist Meetings:   Marland Kitchen Marital Status:   Stress:   . Feeling of Stress :   Tobacco Use: High Risk  . Smoking Tobacco Use: Current Every [name] Smoker  . Smokeless Tobacco Use: Never Used  Transportation Needs: No Transportation Needs  . Lack of Transportation (Medical): No  . Lack of Transportation (Non-Medical): No     Current Diagnosis/Assessment:  Social Hx:  Has been a patient since 25 or 55. Lives alone so feels like he can do what he wants. [name] for 35 years then moved to this area for work. Completely retired. 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  Wife had a genetic disorder. She died 2 years ago. They were married 29 years.  No children together. Wife had 3 children. He has no children of his own.  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 He enjoys working with children.  Mom was born in [place], but he never visited the state. Born in [place], [place].    Goals Addressed            This Visit's Progress   . Chronic Care Management Pharmacy Care Plan       CARE PLAN ENTRY (see longitudinal plan of care for additional care plan information)  Current Barriers:  . Chronic Disease Management support, education, and care coordination needs related to Aflutter, Pre-Diabetes, COPD, Tobacco Use Disorder, Hypertension, Hyperlipidemia/CAD, Depression/Anxiety, Overactive Bladder, GERD, Chronic Pain   Hypertension BP Readings from Last 3 Encounters:  09/21/19 129/84  06/26/19 106/66  12/14/18 114/70   . Pharmacist Clinical Goal(s): o Over the next 90 days, patient will work with PharmD and providers to maintain BP goal <140/90 . Current regimen:  o Metoprolol XL 50 mg 1.5 tabs daily  . Patient self care activities - Over the next 90 days, patient will: o Maintain hypertension medication regimen.  Hyperlipidemia Lab Results  Component Value Date/Time   LDLCALC 60  12/14/2018 11:20 AM   LDLDIRECT 109.0 07/12/2017 11:04 AM   . Pharmacist Clinical Goal(s): o Over the next 90 days, patient will work with PharmD and providers to maintain LDL goal < 70 . Current regimen:  . Atorvastatin 80 mg daily . Ezetimibe 10 mg daily  . Interventions: o Discussed LDL goal . Patient self care activities - Over the next 90 days, patient will: o Maintain cholesterol medication regimen. o   Diabetes Lab Results  Component Value Date/Time   HGBA1C 6.2 09/21/2019 02:36 PM   HGBA1C 6.2 06/26/2019 09:35 AM   . Pharmacist Clinical Goal(s): o Over the next 90 days, patient will work with PharmD and providers to maintain A1c goal <6.5% . Current regimen:  o Diet and exercise management   . Interventions: o Discussed how long term corticosteroid use could increase a1c . Patient self care activities - Over the next 90  days, patient will: o Maintain a1c less than 6.5%  GERD . Pharmacist Clinical Goal(s) o Over the next 90 days, patient will work with PharmD and providers to reduce symptoms of GERD and reduce polypharmacy . Current regimen:  o Omeprazole 20 mg daily  . Interventions: o Discussed the risk/benefit of continuation vs discontinuation of long term PPI use  o Consider PPI tapering plan if agreeable with Dr. Larose Kells o Week 1: Take every other Shakoya Gilmore o Week 2: Take every 3rd Toniesha Zellner (2 days between doses) o Week 3: Take every 4th Jelina Paulsen/Twice a week (3 days between doses) o Week: 4: Take once a week o Week 5: Discontinue . Patient self care activities - Over the next 90 days, patient will: o Implement tapering plan if agreeable with Dr. Larose Kells  Overactive Bladder . Pharmacist Clinical Goal(s) o Over the next 90 days, patient will work with PharmD and providers to reduce symptoms of overactive bladder . Current regimen:  o Oxybutynin XL 15 mg at bedtime . Interventions: o Discussed risk/benefit of increasing oxybutynin dose o Patient to discuss with urologist his  thoughts on increasing oxybutynin dose noting risk/benefit . Patient self care activities - Over the next 90 days, patient will: o Discuss with urologist his thoughts on increasing oxybutynin dose noting risk/benefit  Health Maintenance  . Pharmacist Clinical Goal(s) o Over the next 180 days, patient will work with PharmD and providers to complete health maintenance screenings/vaccinations . Interventions: o Recommend patient receive Td booster around 05/2020 o Discussion about long term steroid use and PPI on bone health  o Consider getting DEXA Scan  . Patient self care activities - Over the next 180 days, patient will: o Get Td booster  Medication management . Pharmacist Clinical Goal(s): o Over the next 90 days, patient will work with PharmD and providers to maintain optimal medication adherence . Current pharmacy: [facility] . Interventions o Comprehensive medication review performed. o Continue current medication management strategy . Patient self care activities - Over the next 90 days, patient will: o Focus on medication adherence by filling and taking medications appropriately  o Take medications as prescribed o Report any questions or concerns to PharmD and/or provider(s)  Initial goal documentation        AFlutter   Patient is currently rate controlled.  Followed by cardio (Dr. Stanford Breed)  Patient has failed these meds in past: Diltiazem (cost) Patient is currently controlled on the following medications: Marland Kitchen Metoprolol XL 50 mg 1.5 tabs daily Lunch . Xarelto 20 mg daily Lunch  Had 2 stents placed with 3 blockages. Warfarin didn't work for him. "Xarelto is my miracle drug". Has only had 2 palpitations since he started the medication.  Pays about ~$45 for 90 DS "Xarelto and alprazolam are the two most important meds I take"  Plan -Continue current medications   Pre-Diabetes   A1c <6.5%  Recent Relevant Labs: Lab Results  Component Value  Date/Time   HGBA1C 6.2 09/21/2019 02:36 PM   HGBA1C 6.2 06/26/2019 09:35 AM   MICROALBUR <0.7 09/21/2019 02:36 PM    Patient has failed these meds in past: None Patient is currently controlled on the following medications: None  Last diabetic Eye exam:  Lab Results  Component Value Date/Time   HMDIABEYEEXA No Retinopathy 07/10/2019 12:00 AM    Last diabetic Foot exam: No results found for: HMDIABFOOTEX   Feels like his a1c has remained stable due to stopping sodas and using stevia as sweetener. Feels the sodas affected  his teeth. Uses no sugar juices and feels better.  Exercise: Walks 0.12m per Navea Woodrow walking dog. This is a leisure stroll due to dog needing to take stops. Hasn't been motivated to due more rigorous walking. Wonders if Dr. PLarose Kellsor Cardio could recommend exercise class. Worried about getting into something that would trigger Afib. Was doing golf, but his wrists have pain.    We discussed: How long term use of corticosteroids could increase a1c over time  Plan -Continue control with diet and exercise   COPD    Did not fully assess during this visit. Will assess at next visit  Last spirometry score: 91% (11/22/14)  Gold Grade: Gold 1 (FEV1>80%) Current COPD Classification:    Eosinophil count:   Lab Results  Component Value Date/Time   EOSPCT 3.6 09/21/2019 02:36 PM  %                               Eos (Absolute):  Lab Results  Component Value Date/Time   EOSABS 0.2 09/21/2019 02:36 PM    Tobacco Status:  Social History   Tobacco Use  Smoking Status Current Every Quinisha Mould Smoker  . Packs/Tyreon Frigon: 0.50  . Years: 40.00  . Pack years: 20.00  . Types: Cigars  . Start date: 1970  Smokeless Tobacco Never Used    Patient has failed these meds in past: Albuterol, QVAR (palpitations) Patient is currently controlled on the following medications: None  Using maintenance inhaler regularly? No (no maintenance inhaler prescribed) Frequency of rescue inhaler use:   Assess at next visit  Plan -Continue current management  Tobacco Use Disorder   Tobacco Status:  Social History   Tobacco Use  Smoking Status Current Every Bert Givans Smoker  . Packs/Ramonda Galyon: 0.50  . Years: 40.00  . Pack years: 20.00  . Types: Cigars  . Start date: 1970  Smokeless Tobacco Never Used    Patient smokes Within 30 minutes of waking ("It's like Christmas!")  "losing my wife was the hardest thing that happened to me." Used cigarettes to escape.  Patient triggers include: going to a social event and seeing someone else smoke (Drinking with friends, around other smokers, celebrations, sex) On a scale of 1-10, reports MOTIVATION to quit is 0 (likes smoking and doesn't want to give it up) On a scale of 1-10, reports CONFIDENCE in quitting is 0  Longest quit time: 8 months (restarted after a hectic Floella Ensz and saw a pack of cigarettes on the deck) Did the 1-800 quit line before  When wife was sick a cigarette break was like a vacation. Tries to limit himself to 1/2 pack per Teandre Hamre.  Patient has failed these meds in past:  Patient is currently uncontrolled on the following medications:  . None  Chantix - heard about side effects, was given this when he wanted to reduce pill burden  Patches - was able to stop for 8 months. If he wants to attempt cessation again. He would go this route. Hard to use during the summer due to sweating.  We discussed:  Implementing smoking cessation plan when patient is ready  Plan -Consider realistic quit date   Hypertension   BP goal is:  <140/90  Office blood pressures are  BP Readings from Last 3 Encounters:  09/21/19 129/84  06/26/19 106/66  12/14/18 114/70   CMP Latest Ref Rng & Units 09/21/2019 06/26/2019 12/14/2018  Glucose 70 - 99 mg/dL 108(H) 111(H) 126(H)  BUN  6 - 23 mg/dL 14 15 17   Creatinine 0.40 - 1.50 mg/dL 0.80 0.83 0.88  Sodium 135 - 145 mEq/L 137 140 141  Potassium 3.5 - 5.1 mEq/L 4.4 4.9 4.6  Chloride 96 - 112 mEq/L 103 103 103    CO2 19 - 32 mEq/L 25 31 23   Calcium 8.4 - 10.5 mg/dL 9.5 9.7 9.8  Total Protein 6.0 - 8.3 g/dL - 6.4 6.4  Total Bilirubin 0.2 - 1.2 mg/dL - 0.8 0.5  Alkaline Phos 39 - 117 U/L - 77 97  AST 0 - 37 U/L - 19 18  ALT 0 - 53 U/L - 26 20   Kidney Function Lab Results  Component Value Date/Time   CREATININE 0.80 09/21/2019 02:36 PM   CREATININE 0.83 06/26/2019 09:35 AM   CREATININE 0.88 05/13/2016 08:48 AM   CREATININE 1.05 05/01/2015 10:56 AM   GFR 95.41 09/21/2019 02:36 PM   GFRNONAA 88 12/14/2018 11:20 AM   GFRAA 101 12/14/2018 11:20 AM   K 4.4 09/21/2019 02:36 PM   K 4.9 06/26/2019 09:35 AM   Patient checks BP at home Will discuss frequency at next visit Patient home BP readings are ranging: Unable to assess  Patient has failed these meds in the past: Diltiazem (cost) Patient is currently controlled on the following medications:  Marland Kitchen Metoprolol XL 50 mg 1.5 tabs daily   Plan -Continue current medications   Hyperlipidemia/CAD   LDL goal < 70  Lipid Panel     Component Value Date/Time   CHOL 128 12/14/2018 1120   TRIG 196 (H) 12/14/2018 1120   HDL 29 (L) 12/14/2018 1120   LDLCALC 60 12/14/2018 1120   LDLDIRECT 109.0 07/12/2017 1104    Hepatic Function Latest Ref Rng & Units 06/26/2019 12/14/2018 08/17/2018  Total Protein 6.0 - 8.3 g/dL 6.4 6.4 -  Albumin 3.5 - 5.2 g/dL 4.1 4.2 -  AST 0 - 37 U/L 19 18 19   ALT 0 - 53 U/L 26 20 23   Alk Phosphatase 39 - 117 U/L 77 97 -  Total Bilirubin 0.2 - 1.2 mg/dL 0.8 0.5 -  Bilirubin, Direct <=0.2 mg/dL - - -     The ASCVD Risk score ([place]., et al., 2013) failed to calculate for the following reasons:   The valid total cholesterol range is 130 to 320 mg/dL   Patient has failed these meds in past: Fenofibrate (cost?) Patient is currently controlled except for TG on the following medications:  . Atorvastatin 80 mg daily Lunch . Ezetimibe 10 mg daily Lunch  We discussed:  LDL goal  Plan -Continue current  medications  Depression / Anxiety   PHQ9 Score:  PHQ9 SCORE ONLY 09/22/2019 06/26/2019 09/20/2018  PHQ-9 Total Score 0 5 1   GAD7 Score: No flowsheet data found.  Patient has failed these meds in past: None noted  Patient is currently controlled on the following medications:  . Alprazolam 0.5 mg BID PRN  . Fluoxetine 20 mg daily AM  Patient received this when he lost his wife. He's not sure if this helps him or not. Alprazolam: Was using three times daily and patient wanted to move to twice daily. Originally prescribed by Dr. Michelle Nasuti due to concern for anxiety attack when patient states he was actually having Aflutter. He did notice benefit of them. He is interested in going back to three daily potentially. Feels "antsy".  Interested in D/C fluoxetine pending clinical team decision.  Will perform PHQ9 at next visit and consult with  Dr. Larose Kells about potential tapering plan.   Plan -Continue current medications   Future Plan -Perform PHQ9 -Implement fluoxetine tapering plan if agreeable with Dr. Larose Kells  Overactive Bladder   PSA  Date Value Ref Range Status  06/26/2019 0.53 0.10 - 4.00 ng/mL Final    Comment:    Test performed using Access Hybritech PSA Assay, a parmagnetic partical, chemiluminecent immunoassay.  12/28/2014 0.51 0.10 - 4.00 ng/mL Final  06/22/2012 0.42 0.10 - 4.00 ng/mL Final     Patient has failed these meds in past: tamsulosin (ineffective?), tolterodine (listed in D/C meds. No apparent reason for D/C) Patient is currently controlled on the following medications:  . Oxybutynin XL 15 mg QHS (updated dose in chart; was increased around 06/2019 per pt)  Prescribed by urology Overnight Urination: Wishes it would work a little better. Has dripping.  Feels he has a hereditary bladder condition. His brother and father both struggle with bladder issues. Notes his PSA has been normal.  Does stomach exercises that helps him from dripping at times.  We discussed:   Possibility of potentially increasing dose noting the risk/benefit of anticholingeric medications  Plan -Discuss possibility of increasing oxybutynin with urologist -Continue current medications   GERD   Patient has failed these meds in past: None noted  Patient is currently controlled on the following medications:  . Omeprazole 20 mg daily   Does not have any GERD symptoms. Feels he received these from Dr. Michelle Nasuti due to taking a lot of medications and not wanting to get upset stomach.  Pt is interested in tapering off of PPI Not taking long term NSAID  We discussed:  the risk/benefit of continuation vs discontinuation of long term PPI use  Plan -Consider tapering plan Week 1: Take every other Tonnie Stillman Week 2: Take every 3rd Josey Dettmann (2 days between doses) Week 3: Take every 4th Tullio Chausse/Twice a week (3 days between doses) Week: 4: Take once a week Week 5: Discontinue  -Continue current medications  Chronic Pain   History of peripheral neuropathy (seeing neuro on 12/14/19) History of lumbar back pain   Patient has failed these meds in past: Etodolac (concern with anticoag) Patient is currently controlled on the following medications:  . Carisoprodol 350 mg TID PRN  (uses once daily at night) . Voltaren 1% gel QID PRN  . Hydrocodone-APAP 5-235 mg QHS  . Tylenol 565m #1 twice daily AM, Lunch  Pt has knee, wrist, and shoulder pain. This would prevent him from sleeping. Acute osteoarthritis in both shoulders. Was told he needs to have both shoulders replaced, but was told it would take 13 weeks to recover and he wouldn't like to do that.  He is receiving corticosteroid injections. These work great. He has no pain for 4-5 months.   Neuropathy: Can't feel anything beneath his ankles.  Read side effects of steroid injections:  -Sleep disturbances: sleep patter is off, sometimes does not go to sleep until 5am. Tried CPAP and did not tolerate it.  -Headaches: They come and go. Happens  rarely. Last 3-4 days, but doesn't recur for 3-4 months. Feels he has cluster headaches based on his research. He can feel it coming on and feel it leaving. Not light sensitive.  -Dizziness: Feels this when he hops out of bed. Realizes he needs to get out of bed slowly. Does not feel this is very problematic.   We discussed:  The risk/benefit of long term corticosteroid injections  Plan -Continue current medications  Misc / OTC    .  Vitamin C 500 mg (during flu season) . Vitamin D 5000 units daily (last vitamin D 10/14/15 = 43) . Multivitamin daily (pt states recommended by Dr. Larose Kells and cardio) . Sildenafil 100 mg daily PRN  . Chlorpheniramine 4 mg 2 tabs daily PRN (during allergy season. Works well for him) . Keto 1533m #2 daily (reduced sugar intake with coffee and has lost weight. Has not noticed any side effects. Would like to get down to 235lbs)  Discussed my philosophy on supplementation:  1) Is there evidence that it helps with the condition/symptom you are attempting to treat/resolve? (efficacy) 2) Does it cause side effects? (harm) 3) Does it interact with your other medications? (interactions) 4) Are you able to afford this in your budget? (affordability)  If all things are satisfied then the patient can make their choice whether they would like to continue with supplementation noting there may not be a benefit to the supplementation.  Plan -Continue current medications   Vaccines   Reviewed patient's vaccination history.    Immunization History  Administered Date(s) Administered  . Fluad Quad(high Dose 65+) 02/10/2019  . Influenza Split 06/05/2012, 06/20/2013, 04/10/2014  . Influenza Whole 04/08/2009, 04/22/2010, 02/11/2011  . Influenza, High Dose Seasonal PF 03/02/2016  . Influenza-Unspecified 03/28/2015, 03/17/2017, 03/10/2018  . PFIZER SARS-COV-2 Vaccination 06/29/2019, 07/19/2019  . Pneumococcal Conjugate-13 04/27/2014  . Pneumococcal Polysaccharide-23 05/19/2010   . Td 05/19/2010  . Zoster 12/25/2013  . Zoster Recombinat (Shingrix) 04/06/2019, 06/18/2019    Plan -Recommend patient receive Td vaccine in pharmacy/office around 05/2020.  Medication Management   [facility] U[facility] pill [facility]? Yes  We discussed: Patient wanting to reduce medication regimen  Plan  Continue current medication management strategy   Meds to D/C from list   Calcium carbonate 600 mg daily

## 2019-12-05 NOTE — Patient Instructions (Addendum)
Visit Information  Goals Addressed            This Visit's Progress   . Chronic Care Management Pharmacy Care Plan       CARE PLAN ENTRY (see longitudinal plan of care for additional care plan information)  Current Barriers:  . Chronic Disease Management support, education, and care coordination needs related to [place], Pre-Diabetes, COPD, Tobacco Use Disorder, Hypertension, Hyperlipidemia/CAD, Depression/Anxiety, Overactive Bladder, GERD, Chronic Pain   Hypertension BP Readings from Last 3 Encounters:  09/21/19 129/84  06/26/19 106/66  12/14/18 114/70   . Pharmacist Clinical Goal(s): o Over the next 90 days, patient will work with PharmD and providers to maintain BP goal <140/90 . Current regimen:  o Metoprolol XL 50 mg 1.5 tabs daily  . Patient self care activities - Over the next 90 days, patient will: o Maintain hypertension medication regimen.  Hyperlipidemia Lab Results  Component Value Date/Time   LDLCALC 60 12/14/2018 11:20 AM   LDLDIRECT 109.0 07/12/2017 11:04 AM   . Pharmacist Clinical Goal(s): o Over the next 90 days, patient will work with PharmD and providers to maintain LDL goal < 70 . Current regimen:  . Atorvastatin 80 mg daily . Ezetimibe 10 mg daily  . Interventions: o Discussed LDL goal . Patient self care activities - Over the next 90 days, patient will: o Maintain cholesterol medication regimen. o   Diabetes Lab Results  Component Value Date/Time   HGBA1C 6.2 09/21/2019 02:36 PM   HGBA1C 6.2 06/26/2019 09:35 AM   . Pharmacist Clinical Goal(s): o Over the next 90 days, patient will work with PharmD and providers to maintain A1c goal <6.5% . Current regimen:  o Diet and exercise management   . Interventions: o Discussed how long term corticosteroid use could increase a1c . Patient self care activities - Over the next 90 days, patient will: o Maintain a1c less than 6.5%  GERD . Pharmacist Clinical Goal(s) o Over the next 90 days,  patient will work with PharmD and providers to reduce symptoms of GERD and reduce polypharmacy . Current regimen:  o Omeprazole 20 mg daily  . Interventions: o Discussed the risk/benefit of continuation vs discontinuation of long term PPI use  o Consider PPI tapering plan if agreeable with Dr. Larose Kells o Week 1: Take every other Clydell Alberts o Week 2: Take every 3rd Joaovictor Krone (2 days between doses) o Week 3: Take every 4th Olsen Mccutchan/Twice a week (3 days between doses) o Week: 4: Take once a week o Week 5: Discontinue . Patient self care activities - Over the next 90 days, patient will: o Implement tapering plan if agreeable with Dr. Larose Kells  Overactive Bladder . Pharmacist Clinical Goal(s) o Over the next 90 days, patient will work with PharmD and providers to reduce symptoms of overactive bladder . Current regimen:  o Oxybutynin XL 15 mg at bedtime . Interventions: o Discussed risk/benefit of increasing oxybutynin dose o Patient to discuss with urologist his thoughts on increasing oxybutynin dose noting risk/benefit . Patient self care activities - Over the next 90 days, patient will: o Discuss with urologist his thoughts on increasing oxybutynin dose noting risk/benefit  Health Maintenance  . Pharmacist Clinical Goal(s) o Over the next 180 days, patient will work with PharmD and providers to complete health maintenance screenings/vaccinations . Interventions: o Recommend patient receive Td booster around 05/2020 o Discussion about long term steroid use and PPI on bone health  o Consider getting DEXA Scan  . Patient self care activities -  Over the next 180 days, patient will: o Get Td booster  Medication management . Pharmacist Clinical Goal(s): o Over the next 90 days, patient will work with PharmD and providers to maintain optimal medication adherence . Current pharmacy: [facility] . Interventions o Comprehensive medication review performed. o Continue current medication management  strategy . Patient self care activities - Over the next 90 days, patient will: o Focus on medication adherence by filling and taking medications appropriately  o Take medications as prescribed o Report any questions or concerns to PharmD and/or provider(s)  Initial goal documentation        Mr. Clipper was given information about Chronic Care Management services today including:  1. CCM service includes personalized support from designated clinical staff supervised by his physician, including individualized plan of care and coordination with other care providers 2. 24/7 contact phone numbers for assistance for urgent and routine care needs. 3. Standard insurance, coinsurance, copays and deductibles apply for chronic care management only during months in which we provide at least 20 minutes of these services. Most insurances cover these services at 100%, however patients may be responsible for any copay, coinsurance and/or deductible if applicable. This service may help you avoid the need for more expensive face-to-face services. 4. Only one practitioner may furnish and bill the service in a calendar month. 5. The patient may stop CCM services at any time (effective at the end of the month) by phone call to the office staff.  Patient agreed to services and verbal consent obtained.   The patient verbalized understanding of instructions provided today and agreed to receive a mailed copy of patient instruction and/or educational materials. Telephone follow up appointment with pharmacy team member scheduled for: 12/20/2019  Melvenia Beam Damiah Mcdonald, PharmD Clinical Pharmacist [facility] [phone]     Smoking and Musculoskeletal Health Smoking is bad for your health. Most people know that smoking causes lung disease, heart disease, and cancer. But people may not realize that it also affects their bones, muscles, and joints (musculoskeletal system). When you smoke, the  effects on your lungs and heart result in less oxygen for your musculoskeletal system. This can lead to poor bone and joint health. How can smoking affect my musculoskeletal health? Smoking can:  Increase your risk of having weak, thin bones (osteoporosis). Elderly smokers are at higher risk for bone fractures related to osteoporosis.  Decrease the ability of bone-forming cells to make and replace bone (in addition to reducing oxygen and blood flow).  Reduce your body's ability to absorb calcium from your diet. Less calcium means weaker bones.  Interfere with the breakdown of the male hormone estrogen. Smoking lowers estrogen, which is a hormone that helps keep bones strong. Women who smoke may have earlier menopause. Menopause is a risk factor for osteoporosis.  Weaken the tissues that attach bones to muscles (tendons). This can lead to shoulder, back, and other joint injuries.  Increase your risk of rheumatoid arthritis or make the condition worse if you already have it.  Slow down healing and increase your risk of infection and other complications if you have a bone fracture or surgery that involves your musculoskeletal system.  Make you get out of breath easily. This can keep you from getting the exercise you need to keep your bones and joints healthy.  Decrease your appetite and body mass. You may lose weight and muscle strength. This can put you at higher risk for muscle injury, joint injury, and  broken bones. What actions can I take to prevent musculoskeletal problems? Quit smoking      Do not start smoking. Quit if you already do. Even stopping later in life can improve musculoskeletal health.  Do not use any products that contain nicotine or tobacco. Do not replace cigarette smoking with e-cigarettes. The safety of e-cigarettes is not known, and some may contain harmful chemicals.  Make a plan to quit smoking and commit to it. Look for programs to help you, and ask your  health care provider for recommendations and ideas.  Talk with your health care provider about using nicotine replacement medicines to help you quit, such as gum, lozenges, patches, sprays, or pills. Make other lifestyle changes   Eat a healthy diet that includes calcium and vitamin D. These nutrients are important for bone health. ? Calcium is found in dairy foods and green leafy vegetables. ? Vitamin D is found in eggs, fish, and liver. ? Many foods also have vitamin D and calcium added to them (are fortified). ? Ask your health care provider if you would benefit from taking a supplement.  Get out in the sunshine for a short time every [name]. This increases production of vitamin D.  Get 30 minutes of exercise at least 5 days a week. Weight-bearing and strength exercises are best for musculoskeletal health. Ask your health care provider what type of exercise is safe for you.  Do not drink alcohol if: ? Your health care provider tells you not to drink. ? You are pregnant, may be pregnant, or are planning to become pregnant.  If you drink alcohol, limit how much you have: ? 0-1 drink a [place] Youngblood for women. ? 0-2 drinks a [place]ge[place] Stiger for men.  Be aware of how much alcohol is in your drink. In the [place] one drink equals one 12 oz bottle of beer (355 mL), one 5 oz glass of wine (148 mL), or one 1 oz glass of hard liquor (44 mL). Where to find more information You may find more information about smoking, musculoskeletal health, and quitting smoking from:  [place] [employer]: orthoinfo.aaos.[place], Osteoporosis and Related [place]: bones.SouthExposed.es  HelpGuide.org: helpguide.org  https://hall.com/: smokefree.gov  American Lung Association: lung.org Contact a health care provider if:  You need help to quit smoking. Summary  When you smoke, the effects on your lungs and heart result in less oxygen for your musculoskeletal  system.  Even stopping smoking later in life can improve musculoskeletal health.  Do not use any products that contain nicotine or tobacco, such as cigarettes and e-cigarettes.  If you need help quitting, ask your health care provider. This information is not intended to replace advice given to you by your health care provider. Make sure you discuss any questions you have with your health care provider. Document Revised: 02/17/2019 Document Reviewed: 09/20/2017 Elsevier Patient Education  [place].

## 2019-12-13 NOTE — Progress Notes (Signed)
[facility]    Provider:  Dr Jaynee Eagles Requesting Provider: Colon Branch, MD Primary Care Provider:  Colon Branch, MD  CC:  headache  HPI:  Brendan [name] is a 69 y.o. male here as requested by Colon Branch, MD for headache and peripheral neuropathy. PMHx   Past medical history anxiety and depression, arthritis, a flutter/fib, B12 deficiency, blurred vision, coronary artery disease, dyslipidemia, erectile dysfunction, hypertension, obesity, obstructive sleep apnea, peripheral neuropathy, prediabetes, TIA, tobacco use history.  I reviewed Dr. Ethel Rana notes, patient is complaining of headache January 2021, MRI of the brain was negative, symptoms resolved, he is anticoagulated, if headache has resurfaced he recommended emergency room if severe different has neck stiffness or worse headache of his life or strokelike symptoms and he was referred to neurology.  In January patient saw Dr. Past, at the time he reported 3 months of headaches typically every 2 weeks, located either at the bilateral top of the head or the nuchal area, the headaches lasted 15 minutes and self resolved, at times the headaches are intense but not the worst of his life, headaches not associated with nausea, vomiting light or noise intolerance, no neck pain.  He is intolerant to CPAP and has obstructive sleep apnea.  He is a smoker.  Peripheral neuropathy, more numbness no pain in sensitive type, B12 deficiency.  He is on chronic pain management.  Dr. Erling Cruz saw him in the past for work-up for his neuropathy which included SPEP, ACE, TSH, B12 which was slightly low with a normal MMA, he also has lumbar spine MRI with multilevel spondylosis, Dr. 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  Patient has already had a workup by neurology for his peripheral neuropathy. I will request Dr. Tressia Danas notes to review. Per pcp, Dr. Erling Cruz thought patient may have CMT so we will order genetic testing today. His father and  his brothers also have neuropathy and "messed up feet" and "hammer toes". We discussed genetic testing. He declines. As far as his headaches, he has never had bad headaches but did have sinus problems and sinus headaches. A few years ago his wife died and it wa traumatic but unsure if any inciting event. He wasn't doing anything, no inciting event, more on the top of the head, several months since it happened last. He has had 2-3 headaches over the last few years. Pressure, painful, throbbing, no light or sound sensitivity, no nausea, severe for about 10-20 minutes and then it resolves. Seems to be more on the left side. Not exertional or positional. He has been more dizzy lately. Foot symptoms ongoing for maybe even 30 years with FHx. Numbness no pain. His feet feel cold a lot.  Imbalance. No hand involvement. Weak ankles, sprained them a lot. He has untreated sleep apnea.    Reviewed notes, labs and imaging from outside physicians, which showed:  Found a note from Dr. Larose Kells 02/2015 where patient describes similar headache, severe, left parietal, sudden onset and severe for 5 minutes. So headaches ongoing longer than patient remembers.   /15/2021: hgba1c 6.2 (6.7 1 year ago). Cbc,bmp unremarkable.B12 569.sed rate 5.   Personally reviewed MRI brain images July 08, 2019 and agree with the following: IMPRESSION: "No acute intracranial abnormality.Stable since 2017 and largely unremarkable for age noncontrast MRI appearance of the brain." However I would add that he has a large right maxillary cyst, looks very large on Coronal imaging, less so on axial.  I also reviewed images  from MRIs from 2017 and 2009, 1 appears that this mucous cyst has doubled in size since 2009.  I requested Dr. Tressia Danas notes and we were able to find them from the archives, patient was initially seen in 2011 by Dr. Erling Cruz at that time patient was concerned about Alzheimer's disease, he had an episode of confusion where he became  confused driving to work, MRI study appeared to be unremarkable, he noted the onset of vertical double vision which resolved, at the time he had no recurrent TIA symptoms work-up for the patient for his neuropathy included ACE, TSH, fasting glucose, B12 level and at the time the B12 a was 273 and methylmalonic acid was normal.  MRI of the lumbar spine in 2010 showed multilevel spondylosis.  He snores loudly at night and his wife cannot sleep in the same room.  Dr. Erling Cruz recommended an EMG nerve conduction study and a polysomnogram which at the time patient declined.  Patient was seen again in February 2011, at that time he did not have any recurrent TIAs, and Dr. Erling Cruz discussed with him that with his family history he most likely has Charcot-Marie-Tooth and Dr. Erling Cruz is also concerned about obstructive sleep apnea but he declined further testing.  Vitamin D deficiency was addressed.  Review of Systems: Patient complains of symptoms per HPI as well as the following symptoms: fatigue, chronic pain. Pertinent negatives and positives per HPI. All others negative.   Social History   Socioeconomic History  . Marital status: Widowed    Spouse name: Not on file  . Number of children: 0  . Years of education: Not on file  . Highest education level: Not on file  Occupational History  . Occupation: retired---[employer]    Employer: [employer]  Tobacco Use  . Smoking status: Current Every Day Smoker    Packs/day: 0.50    Years: 40.00    Pack years: 20.00    Types: Cigarettes    Start date: 67  . Smokeless tobacco: Never Used  Substance and Sexual Activity  . Alcohol use: Yes    Alcohol/week: 4.0 standard drinks    Types: 4 Standard drinks or equivalent per week    Comment: social  . Drug use: No  . Sexual activity: Not Currently  Other Topics Concern  . Not on file  Social History Narrative   Wife had muscle dystrophy, lost wife ~10/2017   Now lives in a smaller house, by himself     Right handed   Caffeine: 1/2 caf 2-3 large cups coffee everyday   Social Determinants of Health   Financial Resource Strain: Low Risk   . Difficulty of Paying Living Expenses: Not hard at all  Food Insecurity: No Food Insecurity  . Worried About Charity fundraiser in the Last Year: Never true  . Ran Out of Food in the Last Year: Never true  Transportation Needs: No Transportation Needs  . Lack of Transportation (Medical): No  . Lack of Transportation (Non-Medical): No  Physical Activity:   . Days of Exercise per Week:   . Minutes of Exercise per Session:   Stress:   . Feeling of Stress :   Social Connections:   . Frequency of Communication with Friends and Family:   . Frequency of Social Gatherings with Friends and Family:   . Attends Religious Services:   . Active Member of Clubs or Organizations:   . Attends Archivist Meetings:   Marland Kitchen Marital Status:  Intimate Partner Violence:   . Fear of Current or Ex-Partner:   . Emotionally Abused:   Marland Kitchen Physically Abused:   . Sexually Abused:     Family History  Problem Relation Age of Onset  . Stroke Mother   . Alzheimer's disease Mother        M, brother; passed from this   . Heart failure Father   . Stroke Father   . Alzheimer's disease Paternal Aunt   . Alzheimer's disease Brother        passed from this   . Alzheimer's disease Maternal Grandmother        passed from this   . Dementia Brother   . Colon cancer Neg Hx   . Esophageal cancer Neg Hx   . Rectal cancer Neg Hx   . Stomach cancer Neg Hx   . Prostate cancer Neg Hx   . IgA nephropathy Neg Hx   . Headache Neg Hx     Past Medical History:  Diagnosis Date  . Allergy   . Anxiety and depression   . Arthritis   . Atrial flutter [facility])    New diagnosis November 10, 2010, rate controlled  . B12 deficiency   . Blurred vision    episode with confusion, evaluated Dr. Erling Cruz  . BPH (benign prostatic hypertrophy)   . CAD (coronary artery disease)    stent, 2001  / nuclear, June, 2010, no ischemia  . Diverticulosis of colon   . Dyslipidemia   . ED (erectile dysfunction)   . Ejection fraction   . GERD (gastroesophageal reflux disease)    pt denies, take omeprazole d/t taking several medications not GERD per pt  . History of colonic polyps   . HTN (hypertension)   . Lumbar back pain   . Obesity   . OSA (obstructive sleep apnea)   . PAF (paroxysmal atrial fibrillation) ([place])   . Peripheral neuropathy    lower extremities  . Prediabetes   . Pulmonary nodule   . Scalp lesion    November, 2011  . Sinus bradycardia   . Stroke ([place])    tia  . Tobacco use    hx of  . Vitamin D deficiency     Patient Active Problem List   Diagnosis Date Noted  . Nonintractable episodic headache 12/14/2019  . Controlled diabetes mellitus type 2 with complications ([place]) 66/11/3014  . COPD mixed type ([place]) 04/18/2018  . OSA (obstructive sleep apnea) 10/16/2015  . Bruit 04/24/2015  . PCP NOTES >>>>>> 02/25/2015  . Hx of colonic polyps 06/25/2014  . Sinus bradycardia 06/19/2014  . Annual physical exam >>>>>>>>>>>>>>>>>> 04/28/2014  . Ejection fraction   . GERD (gastroesophageal reflux disease) 02/11/2011  . Paroxysmal atrial flutter ([place])   . HTN (hypertension)   . CAD (coronary artery disease)   . Dyslipidemia   . Obesity   . B12 deficiency   . VITAMIN D DEFICIENCY 05/20/2009  . Polyneuropathy 09/10/2008  . BACK PAIN, LUMBAR--UDS 08/03/2008  . ERECTILE DYSFUNCTION 02/09/2008  . BPH (benign prostatic hyperplasia) 02/09/2008  . COLONIC POLYPS 07/23/2007  . CIGARETTE SMOKER 07/23/2007  . Solitary pulmonary nodule-chest x-ray stable 08-2014, no further schedule imaging 07/23/2007  . DIVERTICULOSIS OF COLON 07/23/2007  . ANXIETY DEPRESSION 07/08/2007    Past Surgical History:  Procedure Laterality Date  . arm fracture surgery     remotely  . cardica stent x2    . CLAVICLE SURGERY     remotely  . TONSILLECTOMY  Current Outpatient Medications   Medication Sig Dispense Refill  . ALPRAZolam (XANAX) 0.5 MG tablet Take 1 tablet (0.5 mg total) by mouth 2 (two) times daily as needed for anxiety. 60 tablet 0  . atorvastatin (LIPITOR) 80 MG tablet Take 1 tablet (80 mg total) by mouth daily. 90 tablet 1  . carisoprodol (SOMA) 350 MG tablet TAKE ONE TABLET BY MOUTH THREE TIMES A DAY AS NEEDED FOR MUSCLE SPASMS 90 tablet 1  . chlorpheniramine (EQ CHLORTABS) 4 MG tablet Take 8 mg by mouth daily as needed for allergies or rhinitis.    . Cholecalciferol (VITAMIN D3 PO) Take 2,000-5,000 Units by mouth daily.     . diclofenac sodium (VOLTAREN) 1 % GEL Apply 4 g topically 4 (four) times daily as needed. 100 g 3  . ezetimibe (ZETIA) 10 MG tablet Take 1 tablet (10 mg total) by mouth daily. 90 tablet 3  . FLUoxetine (PROZAC) 20 MG capsule Take 1 capsule (20 mg total) by mouth daily. 90 capsule 1  . glucose blood test strip Use as instructed 100 each 12  . HYDROcodone-acetaminophen (NORCO/VICODIN) 5-325 MG tablet TAKE 1 TABLET BY MOUTH AT BEDTIME 30 tablet 0  . metoprolol succinate (TOPROL-XL) 50 MG 24 hr tablet Take 1.5 tablets (75 mg total) by mouth daily. With or immediately following a meal. 135 tablet 1  . Multiple Vitamin (MULTIVITAMIN) tablet Take 1 tablet by mouth daily. Calcium, Magnesium, Zinc    . omeprazole (PRILOSEC) 20 MG capsule Take 1 capsule (20 mg total) by mouth daily. 90 capsule 3  . oxybutynin (DITROPAN XL) 15 MG 24 hr tablet Take 15 mg by mouth at bedtime. Increased from 10mg  by Urologist    . rivaroxaban (XARELTO) 20 MG TABS tablet Take 1 tablet (20 mg total) by mouth daily with supper. 90 tablet 3  . sildenafil (VIAGRA) 100 MG tablet Take 100 mg by mouth daily as needed for erectile dysfunction.     No current facility-administered medications for this visit.    Allergies as of 12/14/2019 - Review Complete 12/14/2019  Allergen Reaction Noted  . Tussionex pennkinetic er [hydrocod polst-cpm polst er] Hives 07/26/2014  . Albuterol  Palpitations 04/06/2014  . Qvar [beclomethasone] Palpitations 04/06/2014    Vitals: BP 126/87 (BP Location: Left Arm, Patient Position: Sitting)   Pulse (!) 57   Ht 6\' 6"  (1.981 m)   Wt 267 lb (121.1 kg)   BMI 30.85 kg/m  Last Weight:  Wt Readings from Last 1 Encounters:  12/14/19 267 lb (121.1 kg)   Last Height:   Ht Readings from Last 1 Encounters:  12/14/19 6\' 6"  (1.981 m)     Physical exam: Exam: Gen: NAD, conversant, well nourised, obese, well groomed                     CV: RRR, no MRG. No Carotid Bruits. No peripheral edema, warm, nontender Eyes: Conjunctivae clear without exudates or hemorrhage  Neuro: Detailed Neurologic Exam  Speech:    Speech is normal; fluent and spontaneous with normal comprehension.  Cognition:    The patient is oriented to person, place, and time;     recent and remote memory intact;     language fluent;     normal attention, concentration,     fund of knowledge Cranial Nerves:    The pupils are equal, round, and reactive to light.could not visualize fundi.  Visual fields are full to finger confrontation. Extraocular movements are intact. Trigeminal sensation is intact  and the muscles of mastication are normal. Left ptosis(chronic). The palate elevates in the midline. Hearing intact. Voice is normal. Shoulder shrug is normal. The tongue has normal motion without fasciculations.   Coordination:    No ataxia or dysmetria. Gait:    Heel-toe and tandem gait with difficulty due to weakness and imbalance.  Slightly wide based.  Motor Observation:    Hammer toes Tone:    Normal muscle tone.    Posture:    Posture is normal. normal erect    Strength: distal weakness plantar flexion and dorsiflexion. Otherwise strength is V/V in the upper and lower limbs.      Sensation: absent pin prick,temp below the ankles and impaired to the knees, vibration at great toe a few secons very minimal, absent proprioception great toe     Reflex  Exam:  DTR's: Absent Ajs and patellars. Uppers normal.     Toes:    The toes are equivocal bilaterally.   Clonus:    Clonus is absent.    Assessment/Plan:  69 y.o. male here as requested by Colon Branch, MD for headache and peripheral neuropathy. PMHx   Past medical history anxiety and depression, arthritis, a flutter/fib, B12 deficiency, blurred vision, coronary artery disease, dyslipidemia, erectile dysfunction, hypertension, obesity, obstructive sleep apnea, peripheral neuropathy, prediabetes, TIA, tobacco use history.  Patient has a headache history since at least 2016, very curious episodes of brief (minutes), severe, worst headache of life, left-sided headaches.  He also has hearing changes and tinnitus.  MRIs of the brain have shown no etiology.  I do think it probably is important to get a CTA of the head to make sure that there is no aneurysms or other vascular etiology.  Patient also reports he has dizziness and facial fullness: MRIs of the brain have been essentially normal for age however I would add that he has a large right maxillary cyst, looks very large on Coronal imaging, less so on axial.  I also reviewed images from MRIs from 2017 and 2009, 1 appears that this mucous cyst has doubled in size since 2009.  He also has dizziness and MRI did see some new fluid around the left mastoid.  I will send him to ear nose and throat for evaluation of the cyst removal.  Untreated sleep apnea (reviewed report, AHI 15/h with hypoxia).  We discussed sequelae of untreated sleep apnea and he declines following up although I did encourage him to.  I also did mention that if he does have Charcot-Marie-Tooth, there is a more of an incidence of sleep apnea in these patients.  With his longstanding nonpainful polyneuropathy and family history I do think Charcot-Marie-Tooth is likely.  He has declined EMG nerve conduction study in the past.  We discussed the pros and cons of genetic testing and he  declines, he has no children and does not feel the need to pursue this.  Orders Placed This Encounter  Procedures  . CT ANGIO HEAD W OR WO CONTRAST  . Ambulatory referral to ENT     Cc: Colon Branch, MD  Sarina Ill, MD  [facility] [address] [place], [place] [address]  Phone [phone] Fax [phone]  I spent 90 minutes of face-to-face and non-face-to-face time with patient on the  1. Severe headache   2. Mucous retention cyst of maxillary sinus   3. Sinus pressure   4. Mastoiditis of left side   5. Other vascular headache   6. [place]  headache   7. Worst headache of life   8. OSA (obstructive sleep apnea)   9. Polyneuropathy   10. Nonintractable episodic headache, unspecified headache type    diagnosis.  This included previsit chart review, lab review, study review, order entry, electronic health record documentation, patient education on the different diagnostic and therapeutic options, counseling and coordination of care, risks and benefits of management, compliance, or risk factor reduction

## 2019-12-14 ENCOUNTER — Other Ambulatory Visit: Payer: Self-pay

## 2019-12-14 ENCOUNTER — Ambulatory Visit (INDEPENDENT_AMBULATORY_CARE_PROVIDER_SITE_OTHER): Payer: Medicare HMO | Admitting: Neurology

## 2019-12-14 ENCOUNTER — Encounter: Payer: Self-pay | Admitting: Neurology

## 2019-12-14 VITALS — BP 126/87 | HR 57 | Ht 78.0 in | Wt 267.0 lb

## 2019-12-14 DIAGNOSIS — G441 Vascular headache, not elsewhere classified: Secondary | ICD-10-CM | POA: Diagnosis not present

## 2019-12-14 DIAGNOSIS — G4733 Obstructive sleep apnea (adult) (pediatric): Secondary | ICD-10-CM | POA: Diagnosis not present

## 2019-12-14 DIAGNOSIS — J3489 Other specified disorders of nose and nasal sinuses: Secondary | ICD-10-CM | POA: Diagnosis not present

## 2019-12-14 DIAGNOSIS — R519 Headache, unspecified: Secondary | ICD-10-CM | POA: Diagnosis not present

## 2019-12-14 DIAGNOSIS — H7092 Unspecified mastoiditis, left ear: Secondary | ICD-10-CM | POA: Diagnosis not present

## 2019-12-14 DIAGNOSIS — G629 Polyneuropathy, unspecified: Secondary | ICD-10-CM

## 2019-12-14 DIAGNOSIS — G4453 Primary thunderclap headache: Secondary | ICD-10-CM | POA: Diagnosis not present

## 2019-12-14 DIAGNOSIS — J341 Cyst and mucocele of nose and nasal sinus: Secondary | ICD-10-CM

## 2019-12-14 NOTE — Patient Instructions (Signed)
CT of the arteries of the head and neck Send you to ENT Consider genetic testing   Charcot-Marie-Tooth Disease, Adult  Charcot-Marie-Tooth disease (CMT) is a group of genetic nervous system diseases that cause the arms and legs to lose feeling and strength over time. The condition usually develops in childhood or early adolescence, but it may not be diagnosed until adulthood. CMT affects the peripheral nerves, which are the nerves outside the brain and spinal cord. There are several types of CMT. Over time, the arm and leg muscles may shrink (atrophy). Symptoms of CMT can range from mild to severe. What are the causes? This condition is caused by genes that are usually passed down through families (inherited). CMT is caused by mutations in the genes that affect the insulation around peripheral nerves (myelin). Over time, these mutations cause the myelin to break down, leading to numbness and weakness in the feet and hands. Rarely, a change in the genes (spontaneous mutation) can cause this condition even if there is no family history of it. What increases the risk? You may be at higher risk of CMT if one or both of your parents carry the gene for CMT. Some forms of CMT can develop from inheriting the gene from one parent, and other forms of CMT develop from inheriting the gene from both parents. What are the signs or symptoms? Symptoms of CMT may vary depending on the form of CMT you have. Symptoms start gradually and get worse over the course of years. Symptoms include:  Muscle weakness in the foot or lower leg.  Loss of feeling in the foot and lower leg.  Trouble walking or an unusual pace and stride (gait).  Frequent falls.  Deformity of the lower legs and feet, such as high arches.  Mild to severe pain. This is not common. As the disease progresses, it often affects other parts of the body. Later symptoms include:  Weakness in the hands.  Loss of fine motor skills  (dexterity).  Loss of feeling in the hands. Rare types of CMT can lead to:  Hearing and vision loss.  Shortness of breath.  Vocal cord weakness. How is this diagnosed? This condition may be diagnosed based on:  Your symptoms and a physical exam.  Your medical and family history.  An exam done by a health care provider who specializes in diseases of the nervous system (neurologist). You may also need to have tests, including:  Electromyography (EMG).  Nerve conduction studies.  Genetic testing. How is this treated? There is no cure for CMT. Treatment depends on the severity of your symptoms. Your team of health care providers will develop a treatment plan that is best for you. Treatment may include:  Exercises (physical therapy) to: ? Strengthen muscles. ? Reduce pain through stretching. ? Increase stamina. ? Prevent falls.  Occupational therapy to help you do your normal activities.  Assistive devices, such as shoe inserts (orthotics), ankle braces, and thumb splints.  Surgery to repair deformities in your feet or joints.  Medicine to relieve pain. Follow these instructions at home:  Lifestyle  Do any physical or occupational therapy exercises at home as instructed.  Use your assistive devices as needed to help with mobility and to prevent accidents and injuries.  Consider joining a support group for CMT disease. General instructions  Take over-the-counter and prescription medicines only as told by your health care provider.  Learn as much as you can about your condition and work closely with your team of  health care providers.  Keep all follow-up visits as told by your health care provider. This is important. Contact a health care provider if you:  Develop new or worsening symptoms.  Develop new wounds on your feet or legs.  Need more support at home.  Are falling.  Have hearing loss. Get help right away if you have:  Severe pain.  Shortness of  breath.  Severe pain or injury after a fall. Summary  Charcot-Marie-Tooth disease (CMT) is a group of genetic nervous system diseases that cause the arms and legs to lose feeling and strength over time. The condition usually develops in childhood or early adolescence.  CMT is caused by inheriting a genetic mutation.  Symptoms of CMT start gradually and get worse over the course of years. They can range from mild to severe.  There is no cure for CMT. Treatment may include physical therapy, braces, and pain management. This information is not intended to replace advice given to you by your health care provider. Make sure you discuss any questions you have with your health care provider. Document Revised: 09/07/2018 Document Reviewed: 09/07/2018 Elsevier Patient Education  [place].

## 2019-12-18 ENCOUNTER — Telehealth: Payer: Self-pay | Admitting: Neurology

## 2019-12-18 ENCOUNTER — Telehealth: Payer: Self-pay | Admitting: Internal Medicine

## 2019-12-18 DIAGNOSIS — N5089 Other specified disorders of the male genital organs: Secondary | ICD-10-CM

## 2019-12-18 NOTE — Telephone Encounter (Signed)
Richardson Landry: [id number] 9exp. 12/18/19 to 01/17/20) order sent to GI. They will reach out to the patient to schedule.

## 2019-12-18 NOTE — Telephone Encounter (Signed)
Recommendations for the patient after he have a visit with our clinical pharmacist:  1. Pt would like to reduce his medication regimen and would like to taper off of omeprazole, I agree, please advise patient is to do the following.  We would do a 4 week tapering plan:  Week 1: Take every other day  Week 2: Take every 3rd day (2 days between doses)  Week 3: Take every 4th day/Twice a week (3 days between doses)  Week: 4: Take once a week  Week 5: Discontinue   2. Order a bone density test if the patient agreeable, he has taken the steroid injections before and is in long-term PPI

## 2019-12-19 NOTE — Telephone Encounter (Signed)
Bone density test ordered. Message sent to [facility] to discuss d/c of PPI.

## 2019-12-19 NOTE — Telephone Encounter (Signed)
I would love to help with this! Please let me know if I can assist with anything further!   De Blanch, PharmD Clinical Pharmacist [facility] [phone]

## 2019-12-20 ENCOUNTER — Other Ambulatory Visit: Payer: Self-pay

## 2019-12-20 ENCOUNTER — Ambulatory Visit: Payer: Medicare HMO | Admitting: Pharmacist

## 2019-12-20 DIAGNOSIS — F341 Dysthymic disorder: Secondary | ICD-10-CM

## 2019-12-20 DIAGNOSIS — K219 Gastro-esophageal reflux disease without esophagitis: Secondary | ICD-10-CM

## 2019-12-20 NOTE — Chronic Care Management (AMB) (Signed)
Chronic Care Management Pharmacy  Name: Brendan [name]  MRN: [id number] DOB: [birth year]   Chief Complaint/ HPI  Brendan [name],  69 y.o. , male presents for their Follow-Up CCM visit with the clinical pharmacist via telephone due to COVID-19 Pandemic.  PCP : Brendan Branch, MD  Their chronic conditions include: Aflutter, Pre-Diabetes, COPD, Tobacco Use Disorder, Hypertension, Hyperlipidemia/CAD, Depression/Anxiety, Overactive Bladder, GERD, Chronic Pain  Office Visits: 12/18/19: Dr. Larose [name] agreeable with omeprazole taper and ordered DEXA Scan  Consult Visit: 12/14/19: Neuro visit w/ Dr. Jaynee [name] - New patient visit. Probable dx of CMT. No med changes noted. Recommended CT and ENT.   Allergies  Allergen Reactions  . Tussionex Pennkinetic Er [Hydrocod Polst-Cpm Polst Er] Hives  . Albuterol Palpitations  . Qvar [Beclomethasone] Palpitations    Medications: Outpatient Encounter Medications as of 12/20/2019  Medication Sig  . ALPRAZolam (XANAX) 0.5 MG tablet Take 1 tablet (0.5 mg total) by mouth 2 (two) times daily as needed for anxiety.  Marland Kitchen atorvastatin (LIPITOR) 80 MG tablet Take 1 tablet (80 mg total) by mouth daily.  . carisoprodol (SOMA) 350 MG tablet TAKE ONE TABLET BY MOUTH THREE TIMES A Brendan [name] AS NEEDED FOR MUSCLE SPASMS  . chlorpheniramine (EQ CHLORTABS) 4 MG tablet Take 8 mg by mouth daily as needed for allergies or rhinitis.  . Cholecalciferol (VITAMIN D3 PO) Take 2,000-5,000 Units by mouth daily.   . diclofenac sodium (VOLTAREN) 1 % GEL Apply 4 g topically 4 (four) times daily as needed.  . ezetimibe (ZETIA) 10 MG tablet Take 1 tablet (10 mg total) by mouth daily.  Marland Kitchen FLUoxetine (PROZAC) 20 MG capsule Take 1 capsule (20 mg total) by mouth daily.  Marland Kitchen glucose blood test strip Use as instructed  . HYDROcodone-acetaminophen (NORCO/VICODIN) 5-325 MG tablet TAKE 1 TABLET BY MOUTH AT BEDTIME  . metoprolol succinate (TOPROL-XL) 50 MG 24 hr tablet Take 1.5 tablets (75 mg total) by mouth  daily. With or immediately following a meal.  . Multiple Vitamin (MULTIVITAMIN) tablet Take 1 tablet by mouth daily. Calcium, Magnesium, Zinc  . omeprazole (PRILOSEC) 20 MG capsule Take 1 capsule (20 mg total) by mouth daily.  Marland Kitchen oxybutynin (DITROPAN XL) 15 MG 24 hr tablet Take 15 mg by mouth at bedtime. Increased from 10mg  by Urologist  . rivaroxaban (XARELTO) 20 MG TABS tablet Take 1 tablet (20 mg total) by mouth daily with supper.  . sildenafil (VIAGRA) 100 MG tablet Take 100 mg by mouth daily as needed for erectile dysfunction.   No facility-administered encounter medications on file as of 12/20/2019.   SDOH Screenings   Alcohol Screen:   . Last Alcohol Screening Score (AUDIT):   Depression (PHQ2-9): Low Risk   . PHQ-2 Score: 3  Financial Resource Strain: Low Risk   . Difficulty of Paying Living Expenses: Not hard at all  Food Insecurity: No Food Insecurity  . Worried About Charity fundraiser in the Last Year: Never true  . Ran Out of Food in the Last Year: Never true  Housing: Low Risk   . Last Housing Risk Score: 0  Physical Activity:   . Days of Exercise per [name]:   . Minutes of Exercise per Session:   Social Connections:   . Frequency of Communication with Friends and Family:   . Frequency of Social Gatherings with Friends and Family:   . Attends Religious Services:   . Active Member of Clubs or Organizations:   . Attends Archivist Meetings:   .  Marital Status:   Stress:   . Feeling of Stress :   Tobacco Use: High Risk  . Smoking Tobacco Use: Current Every Brendan [name] Smoker  . Smokeless Tobacco Use: Never Used  Transportation Needs: No Transportation Needs  . Lack of Transportation (Medical): No  . Lack of Transportation (Non-Medical): No     Current Diagnosis/Assessment:  Social Hx:  Has been a patient since 89 or 68. Lives alone so feels like he can do what he wants. Brendan [name] for 35 years then moved to this area for work. Completely retired. 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  Wife had a genetic disorder. She died 2 years ago. They were married 29 years.  No children together. Wife had 3 children. He has no children of his own.  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 He enjoys working with children.  Mom was born in [place], but he never visited the state. Born in [place], [place].    Goals Addressed            This Visit's Progress   . Chronic Care Management Pharmacy Care Plan       CARE PLAN ENTRY (see longitudinal plan of care for additional care plan information)  Current Barriers:  . Chronic Disease Management support, education, and care coordination needs related to Aflutter, Pre-Diabetes, COPD, Tobacco Use Disorder, Hypertension, Hyperlipidemia/CAD, Depression/Anxiety, Overactive Bladder, GERD, Chronic Pain   Hypertension BP Readings from Last 3 Encounters:  09/21/19 129/84  06/26/19 106/66  12/14/18 114/70   . Pharmacist Clinical Goal(s): o Over the next 90 days, patient will work with PharmD and providers to maintain BP goal <140/90 . Current regimen:  o Metoprolol XL 50 mg 1.5 tabs daily  . Patient self care activities - Over the next 90 days, patient will: o Maintain hypertension medication regimen.  Hyperlipidemia Lab Results  Component Value Date/Time   LDLCALC 60 12/14/2018 11:20 AM   LDLDIRECT 109.0 07/12/2017 11:04 AM   . Pharmacist Clinical Goal(s): o Over the next 90 days, patient will work with PharmD and providers to maintain LDL goal < 70 . Current regimen:  . Atorvastatin 80 mg daily . Ezetimibe 10 mg daily  . Interventions: o Discussed LDL goal . Patient self care activities - Over the next 90 days, patient will: o Maintain cholesterol medication regimen. o   Diabetes Lab Results  Component Value Date/Time   HGBA1C 6.2 09/21/2019 02:36 PM   HGBA1C 6.2 06/26/2019 09:35 AM   . Pharmacist Clinical Goal(s): o Over the next 90 days, patient will  work with PharmD and providers to maintain A1c goal <6.5% . Current regimen:  o Diet and exercise management   . Interventions: o Discussed how long term corticosteroid use could increase a1c . Patient self care activities - Over the next 90 days, patient will: o Maintain a1c less than 6.5%  GERD . Pharmacist Clinical Goal(s) o Over the next 90 days, patient will work with PharmD and providers to reduce symptoms of GERD and reduce polypharmacy . Current regimen:  o Omeprazole 20 mg daily  . Interventions: o Discussed the risk/benefit of continuation vs discontinuation of long term PPI use  o Consider PPI tapering plan after DEXA Scan noting to use famotidine on days not taking omeprazole o [name] 1: Take every other Brendan [name] o [name] 2: Take every 3rd Brendan [name] (2 days between doses) o [name] 3: Take every 4th Brendan [name] (3 days between doses) o [name]: 4: Take once  a [name] o [name] 5: Discontinue . Patient self care activities - Over the next 90 days, patient will: o Implement tapering plan if willing after reviewing DEXA results  Depression/Anxiety . Pharmacist Clinical Goal(s) o Over the next 90 days, patient will work with PharmD and providers to reduce symptoms of depression/anxiety . Current regimen:  . Alprazolam 0.5 mg BID PRN  . Fluoxetine 20 mg daily AM . Interventions: o Consider tapering plan after stabilization of upcoming appointments/assessments with neuro . Patient self care activities - Over the next 90 days, patient will: o Maintain depression/anxiety medication regimen.    Overactive Bladder . Pharmacist Clinical Goal(s) o Over the next 90 days, patient will work with PharmD and providers to reduce symptoms of overactive bladder . Current regimen:  o Oxybutynin XL 15 mg at bedtime . Interventions: o Discussed risk/benefit of increasing oxybutynin dose o Patient to discuss with urologist his thoughts on increasing oxybutynin dose noting risk/benefit . Patient self care  activities - Over the next 90 days, patient will: o Discuss with urologist his thoughts on increasing oxybutynin dose noting risk/benefit  Health Maintenance  . Pharmacist Clinical Goal(s) o Over the next 180 days, patient will work with PharmD and providers to complete health maintenance screenings/vaccinations . Interventions: o Recommend patient receive Td booster around 05/2020 o Discussion about long term steroid use and PPI on bone health  o Consider getting DEXA Scan  . Patient self care activities - Over the next 180 days, patient will: o Get Td booster  Medication management . Pharmacist Clinical Goal(s): o Over the next 90 days, patient will work with PharmD and providers to maintain optimal medication adherence . Current pharmacy: [facility] . Interventions o Comprehensive medication review performed. o Continue current medication management strategy . Patient self care activities - Over the next 90 days, patient will: o Focus on medication adherence by filling and taking medications appropriately  o Take medications as prescribed o Report any questions or concerns to PharmD and/or provider(s)  Please see past updates related to this goal by clicking on the "Past Updates" button in the selected goal         Depression / Anxiety   PHQ9 Score:  PHQ9 SCORE ONLY 12/20/2019 09/22/2019 06/26/2019  PHQ-9 Total Score 3 0 5   GAD7 Score: No flowsheet data found.  Patient has failed these meds in past: None noted  Patient is currently controlled on the following medications:  . Alprazolam 0.5 mg BID PRN  . Fluoxetine 20 mg daily AM  Patient received this when he lost his wife. He's not sure if this helps him or not. Alprazolam: Was using three times daily and patient wanted to move to twice daily. Originally prescribed by Dr. Michelle Nasuti due to concern for anxiety attack when patient states he was actually having Aflutter. He did notice benefit of them. He is interested  in going back to three daily potentially. Feels "antsy".  Interested in D/C fluoxetine pending clinical team decision.  Will perform PHQ9 at next visit and consult with Dr. Larose [name] about potential tapering plan.   Update 12/20/19 PHQ9: 3 Will wait to consider tapering plan until further analysis with upcoming visits.   Plan -Continue current medications   Future Plan -Consider fluoxetine taper after stabilization with other health concerns.   GERD   Patient has failed these meds in past: None noted  Patient is currently controlled on the following medications:  . Omeprazole 20 mg daily   Does not  have any GERD symptoms. Feels he received these from Dr. Michelle Nasuti due to taking a lot of medications and not wanting to get upset stomach.  Pt is interested in tapering off of PPI Not taking long term NSAID  We discussed:  the risk/benefit of continuation vs discontinuation of long term PPI use  Update 12/20/19 When he skipped a Jencarlos Nicolson in between he felt uncomfortable stomach feelings.  He felt even more discomfort when he tried to skip 2 days between doses.  Restarted omeprazole on Sunday 7/11 or 7/12.   Could consider restarting taper using famotidine 20mg  daily to twice daily during days he does not take omeprazole.  Informed pt about order to get DEXA Scan. Provided him number to get this scheduled. Will re-attempt tapering plan pending DEXA Scan. Encouraged patient to get this scheduled   -Continue current medications    Future Plan -Implement tapering plan noting to use famotidine on days not taking omeprazole as patient did not tolerate initial tapering plan. [name] 1: Take every other Joell Usman [name] 2: Take every 3rd Madeline Pho (2 days between doses) [name] 3: Take every 4th Brendan [name]/Twice a [name] (3 days between doses) [name]: 4: Take once a [name] [name] 5: Discontinue   Vaccines   Reviewed patient's vaccination history.    Immunization History  Administered Date(s) Administered  . Fluad  Quad(high Dose 65+) 02/10/2019  . Influenza Split 06/05/2012, 06/20/2013, 04/10/2014  . Influenza Whole 04/08/2009, 04/22/2010, 02/11/2011  . Influenza, High Dose Seasonal PF 03/02/2016  . Influenza-Unspecified 03/28/2015, 03/17/2017, 03/10/2018  . PFIZER SARS-COV-2 Vaccination 06/29/2019, 07/19/2019  . Pneumococcal Conjugate-13 04/27/2014  . Pneumococcal Polysaccharide-23 05/19/2010  . Td 05/19/2010  . Zoster 12/25/2013  . Zoster Recombinat (Shingrix) 04/06/2019, 06/18/2019    Plan -Recommend patient receive Td vaccine in pharmacy/office around 05/2020.  Medication Management   Pt uses [facility] for all medications Uses pill box? Yes  We discussed: Patient wanting to reduce medication regimen  Plan  Continue current medication management strategy   Meds to D/C from list   Calcium carbonate 600 mg daily

## 2019-12-20 NOTE — Patient Instructions (Signed)
Visit Information  Goals Addressed            This Visit's Progress   . Chronic Care Management Pharmacy Care Plan       CARE PLAN ENTRY (see longitudinal plan of care for additional care plan information)  Current Barriers:  . Chronic Disease Management support, education, and care coordination needs related to Aflutter, Pre-Diabetes, COPD, Tobacco Use Disorder, Hypertension, Hyperlipidemia/CAD, Depression/Anxiety, Overactive Bladder, GERD, Chronic Pain   Hypertension BP Readings from Last 3 Encounters:  09/21/19 129/84  06/26/19 106/66  12/14/18 114/70   . Pharmacist Clinical Goal(s): o Over the next 90 days, patient will work with PharmD and providers to maintain BP goal <140/90 . Current regimen:  o Metoprolol XL 50 mg 1.5 tabs daily  . Patient self care activities - Over the next 90 days, patient will: o Maintain hypertension medication regimen.  Hyperlipidemia Lab Results  Component Value Date/Time   LDLCALC 60 12/14/2018 11:20 AM   LDLDIRECT 109.0 07/12/2017 11:04 AM   . Pharmacist Clinical Goal(s): o Over the next 90 days, patient will work with PharmD and providers to maintain LDL goal < 70 . Current regimen:  . Atorvastatin 80 mg daily . Ezetimibe 10 mg daily  . Interventions: o Discussed LDL goal . Patient self care activities - Over the next 90 days, patient will: o Maintain cholesterol medication regimen. o   Diabetes Lab Results  Component Value Date/Time   HGBA1C 6.2 09/21/2019 02:36 PM   HGBA1C 6.2 06/26/2019 09:35 AM   . Pharmacist Clinical Goal(s): o Over the next 90 days, patient will work with PharmD and providers to maintain A1c goal <6.5% . Current regimen:  o Diet and exercise management   . Interventions: o Discussed how long term corticosteroid use could increase a1c . Patient self care activities - Over the next 90 days, patient will: o Maintain a1c less than 6.5%  GERD . Pharmacist Clinical Goal(s) o Over the next 90 days,  patient will work with PharmD and providers to reduce symptoms of GERD and reduce polypharmacy . Current regimen:  o Omeprazole 20 mg daily  . Interventions: o Discussed the risk/benefit of continuation vs discontinuation of long term PPI use  o Consider PPI tapering plan after DEXA Scan noting to use famotidine on days not taking omeprazole o Week 1: Take every other Lynford Espinoza o Week 2: Take every 3rd Itzayana Pardy (2 days between doses) o Week 3: Take every 4th Ellowyn Rieves/Twice a week (3 days between doses) o Week: 4: Take once a week o Week 5: Discontinue . Patient self care activities - Over the next 90 days, patient will: o Implement tapering plan if willing after reviewing DEXA results  Depression/Anxiety . Pharmacist Clinical Goal(s) o Over the next 90 days, patient will work with PharmD and providers to reduce symptoms of depression/anxiety . Current regimen:  . Alprazolam 0.5 mg BID PRN  . Fluoxetine 20 mg daily AM . Interventions: o Consider tapering plan after stabilization of upcoming appointments/assessments with neuro . Patient self care activities - Over the next 90 days, patient will: o Maintain depression/anxiety medication regimen.    Overactive Bladder . Pharmacist Clinical Goal(s) o Over the next 90 days, patient will work with PharmD and providers to reduce symptoms of overactive bladder . Current regimen:  o Oxybutynin XL 15 mg at bedtime . Interventions: o Discussed risk/benefit of increasing oxybutynin dose o Patient to discuss with urologist his thoughts on increasing oxybutynin dose noting risk/benefit . Patient self care  activities - Over the next 90 days, patient will: o Discuss with urologist his thoughts on increasing oxybutynin dose noting risk/benefit  Health Maintenance  . Pharmacist Clinical Goal(s) o Over the next 180 days, patient will work with PharmD and providers to complete health maintenance screenings/vaccinations . Interventions: o Recommend patient receive  Td booster around 05/2020 o Discussion about long term steroid use and PPI on bone health  o Consider getting DEXA Scan  . Patient self care activities - Over the next 180 days, patient will: o Get Td booster  Medication management . Pharmacist Clinical Goal(s): o Over the next 90 days, patient will work with PharmD and providers to maintain optimal medication adherence . Current pharmacy: [facility] . Interventions o Comprehensive medication review performed. o Continue current medication management strategy . Patient self care activities - Over the next 90 days, patient will: o Focus on medication adherence by filling and taking medications appropriately  o Take medications as prescribed o Report any questions or concerns to PharmD and/or provider(s)  Please see past updates related to this goal by clicking on the "Past Updates" button in the selected goal         The patient verbalized understanding of instructions provided today and agreed to receive a mailed copy of patient instruction and/or educational materials.  Telephone follow up appointment with pharmacy team member scheduled for: 01/29/2020  De Blanch, PharmD Clinical Pharmacist [facility] [phone]

## 2019-12-26 ENCOUNTER — Telehealth: Payer: Self-pay | Admitting: Neurology

## 2019-12-26 NOTE — Telephone Encounter (Signed)
Called the patient back he is keeping the upcoming apt with Dr Lucia Gaskins. Dr Thornell Mule is in [facility] and he is awaiting a call back but unless he says otherwise he will keep upcoming apt with Dr Lucia Gaskins. Pt was appreciative for the call back.

## 2019-12-26 NOTE — Telephone Encounter (Signed)
Pt is NOT second guessing Dr. Jaynee Eagles he is asking.. Pt was referred to Dr. Radene Journey ENT for additional insight. Pt has a long history with Dr.Eric Thornell Mule ENT and wanted to know if he could see Dr Thornell Mule for what she is inquiring because he has been happy with Dr Thornell Mule, He mentioned some diagnosis he had(cyst) and that could be why Dr Jaynee Eagles wants him to see Dr. Lucia Gaskins. Can you call him to advise. He has an appt Monday so wanted to know in advance.

## 2019-12-29 ENCOUNTER — Ambulatory Visit
Admission: RE | Admit: 2019-12-29 | Discharge: 2019-12-29 | Disposition: A | Discharge status: Home / Self Care | Payer: Medicare HMO | Source: Ambulatory Visit | Attending: Neurology | Admitting: Neurology

## 2019-12-29 DIAGNOSIS — G4453 Primary thunderclap headache: Secondary | ICD-10-CM

## 2019-12-29 DIAGNOSIS — J341 Cyst and mucocele of nose and nasal sinus: Secondary | ICD-10-CM | POA: Diagnosis not present

## 2019-12-29 DIAGNOSIS — R519 Headache, unspecified: Secondary | ICD-10-CM

## 2019-12-29 DIAGNOSIS — I6523 Occlusion and stenosis of bilateral carotid arteries: Secondary | ICD-10-CM | POA: Diagnosis not present

## 2019-12-29 DIAGNOSIS — I63211 Cerebral infarction due to unspecified occlusion or stenosis of right vertebral arteries: Secondary | ICD-10-CM | POA: Diagnosis not present

## 2019-12-29 DIAGNOSIS — G441 Vascular headache, not elsewhere classified: Secondary | ICD-10-CM

## 2019-12-29 DIAGNOSIS — J3489 Other specified disorders of nose and nasal sinuses: Secondary | ICD-10-CM | POA: Diagnosis not present

## 2019-12-29 MED ORDER — IOPAMIDOL (ISOVUE-370) INJECTION 76%
75.0000 mL | Freq: Once | INTRAVENOUS | Status: AC | PRN
  Administered 2019-12-29: 75 mL via INTRAVENOUS

## 2020-01-01 ENCOUNTER — Encounter (INDEPENDENT_AMBULATORY_CARE_PROVIDER_SITE_OTHER): Payer: Self-pay | Admitting: Otolaryngology

## 2020-01-01 ENCOUNTER — Other Ambulatory Visit: Payer: Self-pay

## 2020-01-01 ENCOUNTER — Ambulatory Visit (INDEPENDENT_AMBULATORY_CARE_PROVIDER_SITE_OTHER): Payer: Medicare HMO | Admitting: Otolaryngology

## 2020-01-01 VITALS — Temp 97.2°F

## 2020-01-01 DIAGNOSIS — J341 Cyst and mucocele of nose and nasal sinus: Secondary | ICD-10-CM

## 2020-01-01 DIAGNOSIS — J310 Chronic rhinitis: Secondary | ICD-10-CM | POA: Diagnosis not present

## 2020-01-01 NOTE — Progress Notes (Signed)
HPI: Brendan [name] is a 69 y.o. male who presents is referred by Dr. Jaynee Eagles For evaluation of retention cyst noted in the right maxillary sinus on recent CT scan.  He also complains of congestion in the head.  He has been seeing neurology for evaluation of headaches.  Patient also has history of peripheral neuropathy as well as obstructive sleep apnea. Apparently the right maxillary sinus retention cyst has enlarged in size since it was initially seen on MRI scan performed in 2009.  On review of the recent CT scan of his head performed on December 29, 2019 this showed the same maxillary cyst which occupies about a third of the right maxillary sinus more posteriorly inferiorly.  This is not obstructing the sinus ostia.  There is no air-fluid level.  Remaining sinuses are clear.  Past Medical History:  Diagnosis Date  . Allergy   . Anxiety and depression   . Arthritis   . Atrial flutter [facility])    New diagnosis November 10, 2010, rate controlled  . B12 deficiency   . Blurred vision    episode with confusion, evaluated Dr. Erling Cruz  . BPH (benign prostatic hypertrophy)   . CAD (coronary artery disease)    stent, 2001 / nuclear, June, 2010, no ischemia  . Diverticulosis of colon   . Dyslipidemia   . ED (erectile dysfunction)   . Ejection fraction   . GERD (gastroesophageal reflux disease)    pt denies, take omeprazole d/t taking several medications not GERD per pt  . History of colonic polyps   . HTN (hypertension)   . Lumbar back pain   . Obesity   . OSA (obstructive sleep apnea)   . PAF (paroxysmal atrial fibrillation) ([place])   . Peripheral neuropathy    lower extremities  . Prediabetes   . Pulmonary nodule   . Scalp lesion    November, 2011  . Sinus bradycardia   . Stroke ([place])    tia  . Tobacco use    hx of  . Vitamin D deficiency    Past Surgical History:  Procedure Laterality Date  . arm fracture surgery     remotely  . cardica stent x2    . CLAVICLE SURGERY     remotely  .  TONSILLECTOMY     Social History   Socioeconomic History  . Marital status: Widowed    Spouse name: Not on file  . Number of children: 0  . Years of education: Not on file  . Highest education level: Not on file  Occupational History  . Occupation: retired---[employer]    Employer: [employer]  Tobacco Use  . Smoking status: Current Every Day Smoker    Packs/day: 0.50    Years: 51.00    Pack years: 25.50    Types: Cigarettes    Start date: 32  . Smokeless tobacco: Never Used  Substance and Sexual Activity  . Alcohol use: Yes    Alcohol/week: 4.0 standard drinks    Types: 4 Standard drinks or equivalent per week    Comment: social  . Drug use: No  . Sexual activity: Not Currently  Other Topics Concern  . Not on file  Social History Narrative   Wife had muscle dystrophy, lost wife ~10/2017   Now lives in a smaller house, by himself    Right handed   Caffeine: 1/2 caf 2-3 large cups coffee everyday   Social Determinants of Health   Financial Resource Strain: Low Risk   .  Difficulty of Paying Living Expenses: Not hard at all  Food Insecurity: No Food Insecurity  . Worried About Charity fundraiser in the Last Year: Never true  . Ran Out of Food in the Last Year: Never true  Transportation Needs: No Transportation Needs  . Lack of Transportation (Medical): No  . Lack of Transportation (Non-Medical): No  Physical Activity:   . Days of Exercise per Week:   . Minutes of Exercise per Session:   Stress:   . Feeling of Stress :   Social Connections:   . Frequency of Communication with Friends and Family:   . Frequency of Social Gatherings with Friends and Family:   . Attends Religious Services:   . Active Member of Clubs or Organizations:   . Attends Archivist Meetings:   Marland Kitchen Marital Status:    Family History  Problem Relation Age of Onset  . Stroke Mother   . Alzheimer's disease Mother        M, brother; passed from this   . Heart failure  Father   . Stroke Father   . Alzheimer's disease Paternal Aunt   . Alzheimer's disease Brother        passed from this   . Alzheimer's disease Maternal Grandmother        passed from this   . Dementia Brother   . Colon cancer Neg Hx   . Esophageal cancer Neg Hx   . Rectal cancer Neg Hx   . Stomach cancer Neg Hx   . Prostate cancer Neg Hx   . IgA nephropathy Neg Hx   . Headache Neg Hx    Allergies  Allergen Reactions  . Tussionex Pennkinetic Er [Hydrocod Polst-Cpm Polst Er] Hives  . Albuterol Palpitations  . Qvar [Beclomethasone] Palpitations   Prior to Admission medications   Medication Sig Start Date End Date Taking? Authorizing Provider  ALPRAZolam Duanne Moron) 0.5 MG tablet Take 1 tablet (0.5 mg total) by mouth 2 (two) times daily as needed for anxiety. 11/20/19  Yes Paz, Alda Berthold, MD  atorvastatin (LIPITOR) 80 MG tablet Take 1 tablet (80 mg total) by mouth daily. 12/01/19  Yes Colon Branch, MD  carisoprodol (SOMA) 350 MG tablet TAKE ONE TABLET BY MOUTH THREE TIMES A DAY AS NEEDED FOR MUSCLE SPASMS 05/08/19  Yes Paz, Alda Berthold, MD  chlorpheniramine (EQ CHLORTABS) 4 MG tablet Take 8 mg by mouth daily as needed for allergies or rhinitis.   Yes [provider]  Cholecalciferol (VITAMIN D3 PO) Take 2,000-5,000 Units by mouth daily.    Yes [provider]  diclofenac sodium (VOLTAREN) 1 % GEL Apply 4 g topically 4 (four) times daily as needed. 07/20/18  Yes Paz, Alda Berthold, MD  ezetimibe (ZETIA) 10 MG tablet Take 1 tablet (10 mg total) by mouth daily. 05/29/19  Yes Paz, Alda Berthold, MD  FLUoxetine (PROZAC) 20 MG capsule Take 1 capsule (20 mg total) by mouth daily. 06/19/19  Yes Paz, Jacqulyn Bath E, MD  glucose blood test strip Use as instructed 04/16/14  Yes Colon Branch, MD  HYDROcodone-acetaminophen (NORCO/VICODIN) 5-325 MG tablet TAKE 1 TABLET BY MOUTH AT BEDTIME 10/12/19  Yes Paz, Alda Berthold, MD  metoprolol succinate (TOPROL-XL) 50 MG 24 hr tablet Take 1.5 tablets (75 mg total) by mouth daily. With or  immediately following a meal. 11/10/19  Yes Paz, Alda Berthold, MD  Multiple Vitamin (MULTIVITAMIN) tablet Take 1 tablet by mouth daily. Calcium, Magnesium, Zinc   Yes [provider]  omeprazole (PRILOSEC) 20 MG capsule Take 1 capsule (20 mg total) by mouth daily. 01/26/19  Yes Paz, Alda Berthold, MD  oxybutynin (DITROPAN XL) 15 MG 24 hr tablet Take 15 mg by mouth at bedtime. Increased from 10mg  by Urologist   Yes [provider]  rivaroxaban (XARELTO) 20 MG TABS tablet Take 1 tablet (20 mg total) by mouth daily with supper. 04/03/19  Yes Paz, Alda Berthold, MD  sildenafil (VIAGRA) 100 MG tablet Take 100 mg by mouth daily as needed for erectile dysfunction.   Yes [provider]     Positive ROS: Otherwise negative  All other systems have been reviewed and were otherwise negative with the exception of those mentioned in the HPI and as above.  Physical Exam: Constitutional: Alert, well-appearing, no acute distress Ears: External ears without lesions or tenderness.  Patient with a large amount of wax in both ear canals.  The wax on the left side had been pushed down with a Q-tip adjacent to the TM.  Ear canals were cleaned bilaterally with curette and forceps.  TMs are clear bilaterally.  On tuning fork testing patient has mild SNHL with AC > BC bilaterally. Nasal: External nose without lesions. Septum slightly deviated to the right with mild rhinitis.. Clear nasal passages otherwise.  Both middle meatus regions were clear. Oral: Lips and gums without lesions. Tongue and palate mucosa without lesions. Posterior oropharynx clear. Neck: No palpable adenopathy or masses Respiratory: Breathing comfortably  Skin: No facial/neck lesions or rash noted.  Procedures  Assessment: Benign right maxillary sinus retention cyst which is gradually gotten slightly larger over the past 12 years as it was initially seen on MRI scan in 2009.  This is nonobstructing the maxillary sinus ostia and is not causing  any sinus disease.  This is not causing pressure or congestion in the sinuses. Patient with mild rhinitis and would recommend regular use of nasal steroid spray such as Nasacort or Flonase as well as saline irrigation for excessive mucus production  Plan: Recommended regular use of Nasacort or Flonase 2 sprays each nostril at night to help with nasal congestion. I do not feel like the maxillary sinus retention cyst is causing any significant symptoms and does not warrant surgical excision.   Radene Journey, MD   CC:

## 2020-01-02 ENCOUNTER — Telehealth: Payer: Self-pay | Admitting: *Deleted

## 2020-01-02 DIAGNOSIS — F341 Dysthymic disorder: Secondary | ICD-10-CM

## 2020-01-02 MED ORDER — ALPRAZOLAM 0.5 MG PO TABS: 0.5000 mg | ORAL_TABLET | Freq: Two times a day (BID) | ORAL | 0 refills | Status: DC | PRN

## 2020-01-02 NOTE — Telephone Encounter (Signed)
Received fax from [facility] mail order requesting refill of:  Last Alprazolam RX:  11/20/19, #60 Last OV:  09/21/19 Next OV:  01/22/20 UDS:  06/27/19 CSC: 08/17/18

## 2020-01-02 NOTE — Telephone Encounter (Signed)
Rx sent 

## 2020-01-03 ENCOUNTER — Other Ambulatory Visit: Payer: Self-pay | Admitting: Internal Medicine

## 2020-01-03 ENCOUNTER — Other Ambulatory Visit: Payer: Self-pay

## 2020-01-03 ENCOUNTER — Encounter: Payer: Self-pay | Admitting: Internal Medicine

## 2020-01-03 MED ORDER — FLUOXETINE HCL 20 MG PO CAPS: 20.0000 mg | ORAL_CAPSULE | Freq: Every day | ORAL | 1 refills | Status: DC

## 2020-01-03 MED ORDER — OMEPRAZOLE 20 MG PO CPDR: 20.0000 mg | DELAYED_RELEASE_CAPSULE | Freq: Every day | ORAL | 1 refills | Status: DC

## 2020-01-04 ENCOUNTER — Encounter (HOSPITAL_BASED_OUTPATIENT_CLINIC_OR_DEPARTMENT_OTHER): Payer: Self-pay

## 2020-01-04 ENCOUNTER — Other Ambulatory Visit (HOSPITAL_BASED_OUTPATIENT_CLINIC_OR_DEPARTMENT_OTHER): Payer: Medicare HMO

## 2020-01-08 ENCOUNTER — Telehealth: Payer: Self-pay | Admitting: Internal Medicine

## 2020-01-08 DIAGNOSIS — H17812 Minor opacity of cornea, left eye: Secondary | ICD-10-CM | POA: Diagnosis not present

## 2020-01-08 DIAGNOSIS — H02831 Dermatochalasis of right upper eyelid: Secondary | ICD-10-CM | POA: Diagnosis not present

## 2020-01-08 DIAGNOSIS — H43813 Vitreous degeneration, bilateral: Secondary | ICD-10-CM | POA: Diagnosis not present

## 2020-01-08 DIAGNOSIS — H52212 Irregular astigmatism, left eye: Secondary | ICD-10-CM | POA: Diagnosis not present

## 2020-01-08 DIAGNOSIS — H43822 Vitreomacular adhesion, left eye: Secondary | ICD-10-CM | POA: Diagnosis not present

## 2020-01-08 DIAGNOSIS — H52221 Regular astigmatism, right eye: Secondary | ICD-10-CM | POA: Diagnosis not present

## 2020-01-08 DIAGNOSIS — H2513 Age-related nuclear cataract, bilateral: Secondary | ICD-10-CM | POA: Diagnosis not present

## 2020-01-08 DIAGNOSIS — H35033 Hypertensive retinopathy, bilateral: Secondary | ICD-10-CM | POA: Diagnosis not present

## 2020-01-08 NOTE — Telephone Encounter (Signed)
Hydrocodone refill.   Last OV: 09/21/2019 Last Fill: 10/12/2019 #30 and 0RF Pt sig: 1 tab qhs UDS: 06/26/2019 Low risk

## 2020-01-08 NOTE — Telephone Encounter (Signed)
PDMP okay, Rx sent 

## 2020-01-21 ENCOUNTER — Other Ambulatory Visit: Payer: Self-pay | Admitting: Internal Medicine

## 2020-01-22 ENCOUNTER — Other Ambulatory Visit: Payer: Self-pay

## 2020-01-22 ENCOUNTER — Encounter: Payer: Self-pay | Admitting: Internal Medicine

## 2020-01-22 ENCOUNTER — Ambulatory Visit (INDEPENDENT_AMBULATORY_CARE_PROVIDER_SITE_OTHER): Payer: Medicare HMO | Admitting: Internal Medicine

## 2020-01-22 VITALS — BP 122/79 | HR 52 | Temp 98.0°F | Resp 16 | Ht 78.0 in | Wt 270.1 lb

## 2020-01-22 DIAGNOSIS — Z23 Encounter for immunization: Secondary | ICD-10-CM

## 2020-01-22 DIAGNOSIS — E785 Hyperlipidemia, unspecified: Secondary | ICD-10-CM

## 2020-01-22 DIAGNOSIS — E118 Type 2 diabetes mellitus with unspecified complications: Secondary | ICD-10-CM | POA: Diagnosis not present

## 2020-01-22 DIAGNOSIS — F17200 Nicotine dependence, unspecified, uncomplicated: Secondary | ICD-10-CM

## 2020-01-22 DIAGNOSIS — Z0000 Encounter for general adult medical examination without abnormal findings: Secondary | ICD-10-CM

## 2020-01-22 LAB — LIPID PANEL
Cholesterol: 136 mg/dL (ref 0–200)
HDL: 35 mg/dL — ABNORMAL LOW (ref >39.00)
LDL Cholesterol: 70 mg/dL (ref 0–99)
NonHDL: 101.27
Total CHOL/HDL Ratio: 4
Triglycerides: 154 mg/dL — ABNORMAL HIGH (ref 0.0–149.0)
VLDL: 30.8 mg/dL (ref 0.0–40.0)

## 2020-01-22 LAB — AST: AST: 17 U/L (ref 0–37)

## 2020-01-22 LAB — HEMOGLOBIN A1C: Hgb A1c MFr Bld: 6.2 % (ref 4.6–6.5)

## 2020-01-22 LAB — ALT: ALT: 21 U/L (ref 0–53)

## 2020-01-22 MED ORDER — CARISOPRODOL 350 MG PO TABS: ORAL_TABLET | ORAL | 1 refills | Status: DC

## 2020-01-22 NOTE — Patient Instructions (Addendum)
Get a flu shot this fall We will arrange for a CT scan of your chest I am glad you decide to quit tobacco  GO TO THE LAB : Get the blood work     [place], [place] back for a checkup in 4 to 6 months

## 2020-01-22 NOTE — Progress Notes (Signed)
Subjective:    Patient ID: Brendan [name], male    DOB: 1951-01-14, 69 y.o.   MRN: [id number]  DOS:  01/22/2020 Type of visit - description: CPX Multiple issues discussed. In general he seems to be stable.    Review of Systems Denies shortness of breath, lower extremity edema or palpitation. From time to time he has had left-sided chest pain only at rest, only in certain positions.  Able to be active without any problem. Had a bout of diarrhea few days ago, symptoms completely resolved. Denies LUTS  Other than above, a 14 point review of systems is negative      Past Medical History:  Diagnosis Date  . Allergy   . Anxiety and depression   . Arthritis   . Atrial flutter [facility])    New diagnosis November 10, 2010, rate controlled  . B12 deficiency   . Blurred vision    episode with confusion, evaluated Dr. Erling Cruz  . BPH (benign prostatic hypertrophy)   . CAD (coronary artery disease)    stent, 2001 / nuclear, June, 2010, no ischemia  . Diverticulosis of colon   . Dyslipidemia   . ED (erectile dysfunction)   . Ejection fraction   . GERD (gastroesophageal reflux disease)    pt denies, take omeprazole d/t taking several medications not GERD per pt  . History of colonic polyps   . HTN (hypertension)   . Lumbar back pain   . Obesity   . OSA (obstructive sleep apnea)   . PAF (paroxysmal atrial fibrillation) ([place])   . Peripheral neuropathy    lower extremities  . Prediabetes   . Pulmonary nodule   . Scalp lesion    November, 2011  . Sinus bradycardia   . Stroke ([place])    tia  . Tobacco use    hx of  . Vitamin D deficiency     Past Surgical History:  Procedure Laterality Date  . arm fracture surgery     remotely  . cardica stent x2    . CLAVICLE SURGERY     remotely  . TONSILLECTOMY      Allergies as of 01/22/2020      Reactions   Tussionex Pennkinetic Er [hydrocod Polst-cpm Polst Er] Hives   Albuterol Palpitations   Qvar [beclomethasone] Palpitations       Medication List       Accurate as of January 22, 2020 11:59 PM. If you have any questions, ask your nurse or doctor.        ALPRAZolam 0.5 MG tablet Commonly known as: XANAX Take 1 tablet (0.5 mg total) by mouth 2 (two) times daily as needed for anxiety.   atorvastatin 80 MG tablet Commonly known as: LIPITOR Take 1 tablet (80 mg total) by mouth daily.   carisoprodol 350 MG tablet Commonly known as: SOMA TAKE ONE TABLET BY MOUTH THREE TIMES A DAY AS NEEDED FOR MUSCLE SPASMS   diclofenac sodium 1 % Gel Commonly known as: VOLTAREN Apply 4 g topically 4 (four) times daily as needed.   EQ Chlortabs 4 MG tablet Generic drug: chlorpheniramine Take 8 mg by mouth daily as needed for allergies or rhinitis.   ezetimibe 10 MG tablet Commonly known as: ZETIA Take 1 tablet (10 mg total) by mouth daily.   FLUoxetine 20 MG capsule Commonly known as: PROZAC Take 1 capsule (20 mg total) by mouth daily.   glucose blood test strip Use as instructed   HYDROcodone-acetaminophen 5-325 MG tablet Commonly known  as: NORCO/VICODIN TAKE 1 TABLET BY MOUTH AT BEDTIME   metoprolol succinate 50 MG 24 hr tablet Commonly known as: TOPROL-XL Take 1.5 tablets (75 mg total) by mouth daily. With or immediately following a meal.   multivitamin tablet Take 1 tablet by mouth daily. Calcium, Magnesium, Zinc   omeprazole 20 MG capsule Commonly known as: PRILOSEC Take 1 capsule (20 mg total) by mouth daily.   oxybutynin 15 MG 24 hr tablet Commonly known as: DITROPAN XL Take 15 mg by mouth at bedtime. Increased from 10mg  by Urologist   rivaroxaban 20 MG Tabs tablet Commonly known as: Xarelto Take 1 tablet (20 mg total) by mouth daily with supper.   sildenafil 100 MG tablet Commonly known as: VIAGRA Take 100 mg by mouth daily as needed for erectile dysfunction.   VITAMIN D3 PO Take 2,000-5,000 Units by mouth daily.          Objective:   Physical Exam BP 122/79 (BP Location: Left Arm,  Patient Position: Sitting, Cuff Size: Normal)   Pulse (!) 52   Temp 98 F (36.7 C) (Oral)   Resp 16   Ht 6\' 6"  (1.981 m)   Wt 270 lb 2 oz (122.5 kg)   SpO2 97%   BMI 31.22 kg/m  General: Well developed, NAD, BMI noted Neck: No  thyromegaly  HEENT:  Normocephalic . Face symmetric, atraumatic Lungs:  CTA B Normal respiratory effort, no intercostal retractions, no accessory muscle use. Heart: RRR,  no murmur.  Abdomen:  Not distended, soft, non-tender. No rebound or rigidity.   Lower extremities: no pretibial edema bilaterally  Skin: Exposed areas without rash. Not pale. Not jaundice Neurologic:  alert & oriented X3.  Speech normal, gait appropriate for age and unassisted Strength symmetric and appropriate for age.  Psych: Cognition and judgment appear intact.  Cooperative with normal attention span and concentration.  Behavior appropriate. No anxious or depressed appearing.     Assessment    Assessment DM (A1C increased to 6.8 on 12/2017) Peripheral neuropathy (no pain, insensitive type) HTN Hyperlipidemia Anxiety depression -- on xanax  Chronic rhinitis:  Sees ENT CV: Dr Stanford Breed --CAD. Stress test okay 05-2015 -- Paroxysmal Atrial fibrillation --TIA --Abdominal bruit: No AAA. Ultrasound 05/2015 PULM:  OSA - + sleep study 03-2015 ,Cpap  intolerant   COPD: smoker, CT chest 12/2017 :changes noted   GU: BPH, Hypogonadism, ED. Sees urology B12 deficiency Pain mngmt: has back-shoulder  pain and neuropathy  On Soma and Vicodin. Etodolac DC 2016  per Dr Larose Kells d/t concerns regards anticoagulation. Hydrocodone dose needed to be increased Has seen Dr. Erling Cruz several times since 2010:w/u  included SPE, ACE, TSH, B12 (slightly low with a normal methylmalonic acid) 08-2008 had a lumbar spine MRI with multilevel spondylosis. Dr. 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   High cholesterol: On Lipitor, checking labs Anxiety depression: Continue Xanax Pain management: Refill Soma Tobacco: Decided to quit [birth year], on his birthday.  He is already seeking help elsewhere.  Knows to call here if needed. GERD: Unable to stop PPIs as recommended by our clinical pharmacist. Cardiovascular: Seems to be doing okay, has a follow-up with cardiology in October. RTC 4 to 6 months    This visit occurred during the SARS-CoV-2 public health emergency.  Safety protocols  were in place, including screening questions prior to the visit, additional usage of staff PPE, and extensive cleaning of exam room while observing appropriate contact time as indicated for disinfecting solutions.

## 2020-01-22 NOTE — Progress Notes (Signed)
Pre visit review using our clinic review tool, if applicable. No additional management support is needed unless otherwise documented below in the visit note. 

## 2020-01-23 ENCOUNTER — Encounter: Payer: Self-pay | Admitting: Internal Medicine

## 2020-01-23 NOTE — Assessment & Plan Note (Signed)
Tdap:05/19/10 PNM 13: 04/27/14 PNM 23: 2011, booster 01/22/2020  zoster: 12/25/13  Shingrix completed S/p Covid vaccination  CCS: CCS: 07/26/14 with Lucio Edward, MD- Diverticulosis, otherwise normal f/u 10 years Prostate cancer screening: Last visit with urology 05-2018, he recommend yearly PSA.  Last PSA here was 06-2019: Normal. Lung cancer screening: Schedule CT Bone health: On PPIs chronically, declining DEXA Labs: AST, ALT, FLP, A1c. Diet and exercise: Discussed

## 2020-01-23 NOTE — Assessment & Plan Note (Signed)
Here for CPX, several chronic issues assessed DM: Diet controlled, check A1c.   High cholesterol: On Lipitor, checking labs Anxiety depression: Continue Xanax Pain management: Refill Soma Tobacco: Decided to quit [birth year], on his birthday.  He is already seeking help elsewhere.  Knows to call here if needed. GERD: Unable to stop PPIs as recommended by our clinical pharmacist. Cardiovascular: Seems to be doing okay, has a follow-up with cardiology in October. RTC 4 to 6 months

## 2020-01-29 ENCOUNTER — Ambulatory Visit: Payer: Medicare HMO | Admitting: Pharmacist

## 2020-01-29 DIAGNOSIS — E118 Type 2 diabetes mellitus with unspecified complications: Secondary | ICD-10-CM

## 2020-01-29 DIAGNOSIS — E785 Hyperlipidemia, unspecified: Secondary | ICD-10-CM

## 2020-01-29 DIAGNOSIS — F341 Dysthymic disorder: Secondary | ICD-10-CM

## 2020-01-29 NOTE — Chronic Care Management (AMB) (Signed)
 Chronic Care Management Pharmacy  Name: Brendan [name]  MRN: [id number] DOB: 01/04/1951   Chief Complaint/ HPI  Brendan [name],  69 y.o. , male presents for their Follow-Up CCM visit with the clinical pharmacist via telephone due to COVID-19 Pandemic.  PCP : Paz, Jose E, MD  Their chronic conditions include: Aflutter, Pre-Diabetes, COPD, Tobacco Use Disorder, Hypertension, Hyperlipidemia/CAD, Depression/Anxiety, Overactive Bladder, GERD, Chronic Pain  Office Visits: 01/22/20: Visit w/ Dr. Paz - Annual physical exam. Labs ordered. Smoking quit date 02/21/20. Pt unable to taper PPI. F/U w/ cardio in October. No med changes noted.   01/03/20: Patient decided to cancel Bone Density Scan  Consult Visit: 01/01/20: Otolarynology visit w/ Dr. Newman - Benign right maxiallary sinus retention cyst. Has gotten slightly bigger over last 12 years. Recommend regular use of nasal steroid spray such as nasacort or flonase as well as saline irrigation for excessive mucus production. No surgical excision recommended.    Allergies  Allergen Reactions  . Tussionex Pennkinetic Er [Hydrocod Polst-Cpm Polst Er] Hives  . Albuterol Palpitations  . Qvar [Beclomethasone] Palpitations    Medications: Outpatient Encounter Medications as of 01/29/2020  Medication Sig  . ALPRAZolam (XANAX) 0.5 MG tablet Take 1 tablet (0.5 mg total) by mouth 2 (two) times daily as needed for anxiety.  . atorvastatin (LIPITOR) 80 MG tablet Take 1 tablet (80 mg total) by mouth daily.  . carisoprodol (SOMA) 350 MG tablet TAKE ONE TABLET BY MOUTH THREE TIMES A DAY AS NEEDED FOR MUSCLE SPASMS  . chlorpheniramine (EQ CHLORTABS) 4 MG tablet Take 8 mg by mouth daily as needed for allergies or rhinitis.  . Cholecalciferol (VITAMIN D3 PO) Take 2,000-5,000 Units by mouth daily.   . diclofenac sodium (VOLTAREN) 1 % GEL Apply 4 g topically 4 (four) times daily as needed.  . ezetimibe (ZETIA) 10 MG tablet Take 1 tablet (10 mg total) by  mouth daily.  . FLUoxetine (PROZAC) 20 MG capsule Take 1 capsule (20 mg total) by mouth daily.  . glucose blood test strip Use as instructed (Patient not taking: Reported on 01/22/2020)  . HYDROcodone-acetaminophen (NORCO/VICODIN) 5-325 MG tablet TAKE 1 TABLET BY MOUTH AT BEDTIME  . metoprolol succinate (TOPROL-XL) 50 MG 24 hr tablet Take 1.5 tablets (75 mg total) by mouth daily. With or immediately following a meal.  . Multiple Vitamin (MULTIVITAMIN) tablet Take 1 tablet by mouth daily. Calcium, Magnesium, Zinc  . omeprazole (PRILOSEC) 20 MG capsule Take 1 capsule (20 mg total) by mouth daily.  . oxybutynin (DITROPAN XL) 15 MG 24 hr tablet Take 15 mg by mouth at bedtime. Increased from 10mg by Urologist  . rivaroxaban (XARELTO) 20 MG TABS tablet Take 1 tablet (20 mg total) by mouth daily with supper.  . sildenafil (VIAGRA) 100 MG tablet Take 100 mg by mouth daily as needed for erectile dysfunction.   No facility-administered encounter medications on file as of 01/29/2020.   SDOH Screenings   Alcohol Screen:   . Last Alcohol Screening Score (AUDIT): Not on file  Depression (PHQ2-9): Low Risk   . PHQ-2 Score: 3  Financial Resource Strain: Medium Risk  . Difficulty of Paying Living Expenses: Somewhat hard  Food Insecurity: No Food Insecurity  . Worried About Running Out of Food in the Last Year: Never true  . Ran Out of Food in the Last Year: Never true  Housing: Low Risk   . Last Housing Risk Score: 0  Physical Activity:   . Days of Exercise per Week:   Not on file  . Minutes of Exercise per Session: Not on file  Social Connections:   . Frequency of Communication with Friends and Family: Not on file  . Frequency of Social Gatherings with Friends and Family: Not on file  . Attends Religious Services: Not on file  . Active Member of Clubs or Organizations: Not on file  . Attends Club or Organization Meetings: Not on file  . Marital Status: Not on file  Stress:   . Feeling of Stress :  Not on file  Tobacco Use: High Risk  . Smoking Tobacco Use: Current Every Day Smoker  . Smokeless Tobacco Use: Never Used  Transportation Needs: No Transportation Needs  . Lack of Transportation (Medical): No  . Lack of Transportation (Non-Medical): No     Current Diagnosis/Assessment:  Social Hx:  Has been a patient since 1985 or 86. Lives alone so feels like he can do what he wants. [place] for 35 years then moved to this area for work. Completely retired. 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  Wife had a genetic disorder. She died 2 years ago. They were married 29 years.  No children together. Wife had 3 children. He has no children of his own.  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 He enjoys working with children.  Mom was born in [place], but he never visited the state. Born in [place], [place].  Goals Addressed            This Visit's Progress   . Chronic Care Management Pharmacy Care Plan       CARE PLAN ENTRY (see longitudinal plan of care for additional care plan information)  Current Barriers:  . Chronic Disease Management support, education, and care coordination needs related to Aflutter, Pre-Diabetes, COPD, Tobacco Use Disorder, Hypertension, Hyperlipidemia/CAD, Depression/Anxiety, Overactive Bladder, GERD, Chronic Pain   Hypertension BP Readings from Last 3 Encounters:  09/21/19 129/84  06/26/19 106/66  12/14/18 114/70   . Pharmacist Clinical Goal(s): o Over the next 90 days, patient will work with PharmD and providers to maintain BP goal <140/90 . Current regimen:  o Metoprolol XL 50 mg 1.5 tabs daily  . Patient self care activities - Over the next 90 days, patient will: o Maintain hypertension medication regimen.  Hyperlipidemia Lab Results  Component Value Date/Time   LDLCALC 60 12/14/2018 11:20 AM   LDLDIRECT 109.0 07/12/2017 11:04 AM   . Pharmacist Clinical Goal(s): o Over the next 90 days, patient  will work with PharmD and providers to maintain LDL goal < 70 . Current regimen:  . Atorvastatin 80 mg daily . Ezetimibe 10 mg daily  . Interventions: o Discussed LDL goal . Patient self care activities - Over the next 90 days, patient will: o Maintain cholesterol medication regimen. o   Diabetes Lab Results  Component Value Date/Time   HGBA1C 6.2 09/21/2019 02:36 PM   HGBA1C 6.2 06/26/2019 09:35 AM   . Pharmacist Clinical Goal(s): o Over the next 90 days, patient will work with PharmD and providers to maintain A1c goal <6.5% . Current regimen:  o Diet and exercise management   . Interventions: o Discussed how long term corticosteroid use could increase a1c . Patient self care activities - Over the next 90 days, patient will: o Maintain a1c less than 6.5%  GERD . Pharmacist Clinical Goal(s) o Over the next 90 days, patient will work with PharmD and providers to reduce symptoms of GERD and reduce polypharmacy . Current regimen:    o Omeprazole 20 mg daily  . Interventions: o Discussed the risk/benefit of continuation vs discontinuation of long term PPI use  o Consider PPI tapering plan after DEXA Scan noting to use famotidine on days not taking omeprazole o Week 1: Take every other Sariyah Corcino o Week 2: Take every 3rd Magdalynn Davilla (2 days between doses) o Week 3: Take every 4th Elvie Maines/Twice a week (3 days between doses) o Week: 4: Take once a week o Week 5: Discontinue . Patient self care activities - Over the next 90 days, patient will: o Implement tapering plan if willing after reviewing DEXA results  Depression/Anxiety . Pharmacist Clinical Goal(s) o Over the next 90 days, patient will work with PharmD and providers to reduce symptoms of depression/anxiety . Current regimen:  . Alprazolam 0.5 mg BID PRN  . Fluoxetine 20 mg daily AM . Interventions: o Consider tapering plan after stabilization of smoking cessation . Patient self care activities - Over the next 90 days, patient  will: o Maintain depression/anxiety medication regimen.    Tobacco Use Disorder . Pharmacist Clinical Goal(s) o Over the next 90 days, patient will work with PharmD and providers to stop smoking . Current regimen:  o Nicotine patches and gum through NCquits . Interventions: o Will help patient with accountability during smoking cessation process . Patient self care activities - Over the next 90 days, patient will: o Stop smoking   Health Maintenance  . Pharmacist Clinical Goal(s) o Over the next 180 days, patient will work with PharmD and providers to complete health maintenance screenings/vaccinations . Interventions: o Recommend patient receive Td booster around 05/2020 o Discussion about long term steroid use and PPI on bone health  o Consider getting DEXA Scan  . Patient self care activities - Over the next 180 days, patient will: o Get Td booster  Medication management . Pharmacist Clinical Goal(s): o Over the next 90 days, patient will work with PharmD and providers to maintain optimal medication adherence . Current pharmacy: [facility] . Interventions o Comprehensive medication review performed. o Continue current medication management strategy . Patient self care activities - Over the next 90 days, patient will: o Focus on medication adherence by filling and taking medications appropriately  o Take medications as prescribed o Report any questions or concerns to PharmD and/or provider(s)  Please see past updates related to this goal by clicking on the "Past Updates" button in the selected goal        Diabetes   A1c goal <7%  Recent Relevant Labs: Lab Results  Component Value Date/Time   HGBA1C 6.2 01/22/2020 10:28 AM   HGBA1C 6.2 09/21/2019 02:36 PM   GFR 95.41 09/21/2019 02:36 PM   GFR 91.50 06/26/2019 09:35 AM   MICROALBUR <0.7 09/21/2019 02:36 PM    Last diabetic Eye exam:  Lab Results  Component Value Date/Time   HMDIABEYEEXA No Retinopathy  07/10/2019 12:00 AM    Last diabetic Foot exam: No results found for: HMDIABFOOTEX   Patient has failed these meds in past: None noted  Patient is currently controlled and stable from previous on the following medications: . None  Congratulated patient on a1c stability.    Plan -Continue control with diet and exercise   Hyperlipidemia/CAD   LDL goal <70  Lipid Panel     Component Value Date/Time   CHOL 136 01/22/2020 1028   CHOL 128 12/14/2018 1120   TRIG 154.0 (H) 01/22/2020 1028   HDL 35.00 (L) 01/22/2020 1028   HDL 29 (  L) 12/14/2018 1120   LDLCALC 70 01/22/2020 1028   LDLCALC 60 12/14/2018 1120   LDLDIRECT 109.0 07/12/2017 1104    Hepatic Function Latest Ref Rng & Units 01/22/2020 06/26/2019 12/14/2018  Total Protein 6.0 - 8.3 g/dL - 6.4 6.4  Albumin 3.5 - 5.2 g/dL - 4.1 4.2  AST 0 - 37 U/L 17 19 18  ALT 0 - 53 U/L 21 26 20  Alk Phosphatase 39 - 117 U/L - 77 97  Total Bilirubin 0.2 - 1.2 mg/dL - 0.8 0.5  Bilirubin, Direct <=0.2 mg/dL - - -     The 10-year ASCVD risk score (Goff DC Jr., et al., 2013) is: 38.9%   Values used to calculate the score:     Age: 68 years     Sex: Male     Is Non-Hispanic African American: No     Diabetic: Yes     Tobacco smoker: Yes     Systolic Blood Pressure: 122 mmHg     Is BP treated: Yes     HDL Cholesterol: 35 mg/dL     Total Cholesterol: 136 mg/dL   Patient has failed these meds in past: fenofibrate (cost?) Patient is currently borderline controlled (LDL increase from 60 to 70) on the following medications:   Atorvastatin 80 mg daily Lunch  Ezetimibe 10 mg daily Lunch  We discussed:  lipid panel  Plan -Continue current medications  Depression / Anxiety   PHQ9 Score:  PHQ9 SCORE ONLY 12/20/2019 09/22/2019 06/26/2019  PHQ-9 Total Score 3 0 5   GAD7 Score: No flowsheet data found.  Patient has failed these meds in past: None noted  Patient is currently controlled on the following medications:  . Alprazolam 0.5 mg BID  PRN  . Fluoxetine 20 mg daily AM  Patient received this when he lost his wife. He's not sure if this helps him or not. Alprazolam: Was using three times daily and patient wanted to move to twice daily. Originally prescribed by Dr. Nadell due to concern for anxiety attack when patient states he was actually having Aflutter. He did notice benefit of them. He is interested in going back to three daily potentially. Feels "antsy".  Interested in D/C fluoxetine pending clinical team decision.  Will perform PHQ9 at next visit and consult with Dr. Paz about potential tapering plan.   Update 12/20/19 PHQ9: 3 Will wait to consider tapering plan until further analysis with upcoming visits.   Update 01/29/20 We discussed tapering option of fluoxetine and his smoking cessation plan.  He is fearful of gaining weight with smoking cessation. He is using NCquits with free nicotine patches and gums.  Discussed that he should probably wait to taper fluoxetine until after he has stopped smoking for 3-4 months. Patient is agreeable to this plan.   Plan -Continue current medications   Future Plan -Consider fluoxetine taper after stabilization with smoking cessation.   GERD   Patient has failed these meds in past: None noted  Patient is currently controlled on the following medications:  . Omeprazole 20 mg daily   Does not have any GERD symptoms. Feels he received these from Dr. Nadell due to taking a lot of medications and not wanting to get upset stomach.  Pt is interested in tapering off of PPI Not taking long term NSAID  We discussed:  the risk/benefit of continuation vs discontinuation of long term PPI use  Update 12/20/19 When he skipped a day in between he felt uncomfortable stomach feelings.  He   felt even more discomfort when he tried to skip 2 days between doses.  Restarted omeprazole on Sunday 7/11 or 7/12.   Could consider restarting taper using famotidine 44m daily to twice daily  during days he does not take omeprazole.  Informed pt about order to get DEXA Scan. Provided him number to get this scheduled. Will re-attempt tapering plan pending DEXA Scan. Encouraged patient to get this scheduled   Update 01/29/20 DEXA ordered, but patient has not completed it.  Patient is concerned about the cost of DEXA Scan noting his many appts with other providers.  We discussed the risk/benefit of continuation vs discontinuation of long term PPI use.  Plan -Continue current medications    Future Plan -Implement tapering plan noting to use famotidine on days not taking omeprazole as patient did not tolerate initial tapering plan. Week 1: Take every other Aryel Edelen Week 2: Take every 3rd Everly Rubalcava (2 days between doses) Week 3: Take every 4th Shakendra Griffeth/Twice a week (3 days between doses) Week: 4: Take once a week Week 5: Discontinue  Pain   Patient has failed these meds in past: Etodolac (concern with anticoag) Patient is currently uncontrolled on the following medications:  Carisoprodol 350 mg TID PRN  (uses once daily at night)  Voltaren 1% gel QID PRN   Hydrocodone-APAP 5-235 mg QHS   Tylenol 5047m#1 twice daily AM, Lunch  Problem Story Pt has knee, wrist, and shoulder pain. This would prevent him from sleeping. Acute osteoarthritis in both shoulders. Was told he needs to have both shoulders replaced, but was told it would take 13 weeks to recover and he wouldn't like to do that.  He is receiving corticosteroid injections. These work great. He has no pain for 4-5 months.   Update 01/29/20 Feels steroid injections are not working as well as they did in the past. 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 We discussed possible benefit, but that patient should have specific questions answered at his free consultation. (covered by insurance, length of therapy, what specific therapies used, etc).   Plan -Continue current medications

## 2020-01-29 NOTE — Patient Instructions (Signed)
Visit Information  Goals Addressed            This Visit's Progress   . Chronic Care Management Pharmacy Care Plan       CARE PLAN ENTRY (see longitudinal plan of care for additional care plan information)  Current Barriers:  . Chronic Disease Management support, education, and care coordination needs related to Aflutter, Pre-Diabetes, COPD, Tobacco Use Disorder, Hypertension, Hyperlipidemia/CAD, Depression/Anxiety, Overactive Bladder, GERD, Chronic Pain   Hypertension BP Readings from Last 3 Encounters:  09/21/19 129/84  06/26/19 106/66  12/14/18 114/70   . Pharmacist Clinical Goal(s): o Over the next 90 days, patient will work with PharmD and providers to maintain BP goal <140/90 . Current regimen:  o Metoprolol XL 50 mg 1.5 tabs daily  . Patient self care activities - Over the next 90 days, patient will: o Maintain hypertension medication regimen.  Hyperlipidemia Lab Results  Component Value Date/Time   LDLCALC 60 12/14/2018 11:20 AM   LDLDIRECT 109.0 07/12/2017 11:04 AM   . Pharmacist Clinical Goal(s): o Over the next 90 days, patient will work with PharmD and providers to maintain LDL goal < 70 . Current regimen:  . Atorvastatin 80 mg daily . Ezetimibe 10 mg daily  . Interventions: o Discussed LDL goal . Patient self care activities - Over the next 90 days, patient will: o Maintain cholesterol medication regimen. o   Diabetes Lab Results  Component Value Date/Time   HGBA1C 6.2 09/21/2019 02:36 PM   HGBA1C 6.2 06/26/2019 09:35 AM   . Pharmacist Clinical Goal(s): o Over the next 90 days, patient will work with PharmD and providers to maintain A1c goal <6.5% . Current regimen:  o Diet and exercise management   . Interventions: o Discussed how long term corticosteroid use could increase a1c . Patient self care activities - Over the next 90 days, patient will: o Maintain a1c less than 6.5%  GERD . Pharmacist Clinical Goal(s) o Over the next 90 days,  patient will work with PharmD and providers to reduce symptoms of GERD and reduce polypharmacy . Current regimen:  o Omeprazole 20 mg daily  . Interventions: o Discussed the risk/benefit of continuation vs discontinuation of long term PPI use  o Consider PPI tapering plan after DEXA Scan noting to use famotidine on days not taking omeprazole o Week 1: Take every other Kerria Sapien o Week 2: Take every 3rd Roxanna Mcever (2 days between doses) o Week 3: Take every 4th Jatziry Wechter/Twice a week (3 days between doses) o Week: 4: Take once a week o Week 5: Discontinue . Patient self care activities - Over the next 90 days, patient will: o Implement tapering plan if willing after reviewing DEXA results  Depression/Anxiety . Pharmacist Clinical Goal(s) o Over the next 90 days, patient will work with PharmD and providers to reduce symptoms of depression/anxiety . Current regimen:  . Alprazolam 0.5 mg BID PRN  . Fluoxetine 20 mg daily AM . Interventions: o Consider tapering plan after stabilization of smoking cessation . Patient self care activities - Over the next 90 days, patient will: o Maintain depression/anxiety medication regimen.    Tobacco Use Disorder . Pharmacist Clinical Goal(s) o Over the next 90 days, patient will work with PharmD and providers to stop smoking . Current regimen:  o Nicotine patches and gum through NCquits . Interventions: o Will help patient with accountability during smoking cessation process . Patient self care activities - Over the next 90 days, patient will: o Stop smoking   Health  Maintenance  . Pharmacist Clinical Goal(s) o Over the next 180 days, patient will work with PharmD and providers to complete health maintenance screenings/vaccinations . Interventions: o Recommend patient receive Td booster around 05/2020 o Discussion about long term steroid use and PPI on bone health  o Consider getting DEXA Scan  . Patient self care activities - Over the next 180 days, patient  will: o Get Td booster  Medication management . Pharmacist Clinical Goal(s): o Over the next 90 days, patient will work with PharmD and providers to maintain optimal medication adherence . Current pharmacy: [facility] . Interventions o Comprehensive medication review performed. o Continue current medication management strategy . Patient self care activities - Over the next 90 days, patient will: o Focus on medication adherence by filling and taking medications appropriately  o Take medications as prescribed o Report any questions or concerns to PharmD and/or provider(s)  Please see past updates related to this goal by clicking on the "Past Updates" button in the selected goal         The patient verbalized understanding of instructions provided today and agreed to receive a mailed copy of patient instruction and/or educational materials.  Telephone follow up appointment with pharmacy team member scheduled for: 02/28/2020  De Blanch, PharmD Clinical Pharmacist [facility] [phone]

## 2020-02-01 ENCOUNTER — Other Ambulatory Visit: Payer: Self-pay

## 2020-02-01 ENCOUNTER — Ambulatory Visit (HOSPITAL_BASED_OUTPATIENT_CLINIC_OR_DEPARTMENT_OTHER)
Admission: RE | Admit: 2020-02-01 | Discharge: 2020-02-01 | Disposition: A | Discharge status: Home / Self Care | Payer: Medicare HMO | Source: Ambulatory Visit | Attending: Internal Medicine | Admitting: Internal Medicine

## 2020-02-01 DIAGNOSIS — I700 Atherosclerosis of aorta: Secondary | ICD-10-CM | POA: Diagnosis not present

## 2020-02-01 DIAGNOSIS — I2510 Atherosclerotic heart disease of native coronary artery without angina pectoris: Secondary | ICD-10-CM | POA: Insufficient documentation

## 2020-02-01 DIAGNOSIS — F17210 Nicotine dependence, cigarettes, uncomplicated: Secondary | ICD-10-CM | POA: Insufficient documentation

## 2020-02-01 DIAGNOSIS — Z122 Encounter for screening for malignant neoplasm of respiratory organs: Secondary | ICD-10-CM | POA: Insufficient documentation

## 2020-02-01 DIAGNOSIS — J432 Centrilobular emphysema: Secondary | ICD-10-CM | POA: Insufficient documentation

## 2020-02-08 ENCOUNTER — Encounter: Payer: Self-pay | Admitting: Internal Medicine

## 2020-02-09 ENCOUNTER — Other Ambulatory Visit: Payer: Self-pay | Admitting: Internal Medicine

## 2020-02-09 NOTE — Telephone Encounter (Signed)
Hydrocodone refill. Brendan [name] Pt.   Last OV: 01/22/2020 Last Fill: 01/08/2020 #30 and 0RF Pt sig: 1 tab qhs UDS: 06/26/2019 Low risk

## 2020-02-28 ENCOUNTER — Ambulatory Visit: Payer: Medicare HMO | Admitting: Pharmacist

## 2020-02-28 DIAGNOSIS — F17200 Nicotine dependence, unspecified, uncomplicated: Secondary | ICD-10-CM

## 2020-02-28 DIAGNOSIS — F341 Dysthymic disorder: Secondary | ICD-10-CM

## 2020-02-28 NOTE — Patient Instructions (Addendum)
Visit Information  Goals Addressed            This Visit's Progress   . Chronic Care Management Pharmacy Care Plan       CARE PLAN ENTRY (see longitudinal plan of care for additional care plan information)  Current Barriers:  . Chronic Disease Management support, education, and care coordination needs related to Aflutter, Pre-Diabetes, COPD, Tobacco Use Disorder, Hypertension, Hyperlipidemia/CAD, Depression/Anxiety, Overactive Bladder, GERD, Chronic Pain   Hypertension BP Readings from Last 3 Encounters:  01/22/20 122/79  12/14/19 126/87  09/21/19 129/84   . Pharmacist Clinical Goal(s): o Over the next 90 days, patient will work with PharmD and providers to maintain BP goal <140/90 . Current regimen:  o Metoprolol XL 50 mg 1.5 tabs daily  . Patient self care activities - Over the next 90 days, patient will: o Maintain hypertension medication regimen.  Hyperlipidemia Lab Results  Component Value Date/Time   LDLCALC 70 01/22/2020 10:28 AM   LDLCALC 60 12/14/2018 11:20 AM   LDLDIRECT 109.0 07/12/2017 11:04 AM   . Pharmacist Clinical Goal(s): o Over the next 90 days, patient will work with PharmD and providers to maintain LDL goal < 70 . Current regimen:  . Atorvastatin 80 mg daily . Ezetimibe 10 mg daily  . Interventions: o Discussed LDL goal . Patient self care activities - Over the next 90 days, patient will: o Maintain cholesterol medication regimen. o   Diabetes Lab Results  Component Value Date/Time   HGBA1C 6.2 01/22/2020 10:28 AM   HGBA1C 6.2 09/21/2019 02:36 PM   . Pharmacist Clinical Goal(s): o Over the next 90 days, patient will work with PharmD and providers to maintain A1c goal <6.5% . Current regimen:  o Diet and exercise management   . Interventions: o Discussed how long term corticosteroid use could increase a1c . Patient self care activities - Over the next 90 days, patient will: o Maintain a1c less than 6.5%  GERD . Pharmacist Clinical  Goal(s) o Over the next 90 days, patient will work with PharmD and providers to reduce symptoms of GERD and reduce polypharmacy . Current regimen:  o Omeprazole 20 mg daily  . Interventions: o Discussed the risk/benefit of continuation vs discontinuation of long term PPI use  o Consider completing DEXA Scan . Patient self care activities - Over the next 90 days, patient will: o Consider repeat DEXA  Depression/Anxiety . Pharmacist Clinical Goal(s) o Over the next 90 days, patient will work with PharmD and providers to reduce symptoms of depression/anxiety . Current regimen:  . Alprazolam 0.5 mg BID PRN  . Fluoxetine 20 mg daily AM . Interventions: o Continue tapering plan as tolerated . Patient self care activities - Over the next 90 days, patient will: o Maintain depression/anxiety medication regimen.    Tobacco Use Disorder . Pharmacist Clinical Goal(s) o Over the next 90 days, patient will work with PharmD and providers to stop smoking . Current regimen:  . Nicotine 21mg  patches (#14) . Nicotine 4mg  gum (#110) . Interventions: o Will help patient with accountability during smoking cessation process  o New quit date of 03/08/2020 . Patient self care activities - Over the next 90 days, patient will: o Stop smoking   Health Maintenance  . Pharmacist Clinical Goal(s) o Over the next 180 days, patient will work with PharmD and providers to complete health maintenance screenings/vaccinations . Interventions: o Recommend patient receive Td booster around 05/2020 o Discussion about long term steroid use and PPI on bone health  o Consider getting DEXA Scan  . Patient self care activities - Over the next 180 days, patient will: o Get Td booster  Medication management . Pharmacist Clinical Goal(s): o Over the next 90 days, patient will work with PharmD and providers to maintain optimal medication adherence . Current pharmacy: [facility] . Interventions o Comprehensive  medication review performed. o Continue current medication management strategy . Patient self care activities - Over the next 90 days, patient will: o Focus on medication adherence by filling and taking medications appropriately  o Take medications as prescribed o Report any questions or concerns to PharmD and/or provider(s)  Please see past updates related to this goal by clicking on the "Past Updates" button in the selected goal         The patient verbalized understanding of instructions provided today and agreed to receive a mailed copy of patient instruction and/or educational materials.  Telephone follow up appointment with pharmacy team member scheduled for: 03/29/2020  Melvenia Beam Doryce Mcgregory, PharmD Clinical Pharmacist [facility] [phone]

## 2020-02-28 NOTE — Chronic Care Management (AMB) (Signed)
Chronic Care Management Pharmacy  Name: Brendan [name]  MRN: [id number] DOB: [birth year]   Chief Complaint/ HPI  Brendan [name],  69 y.o. , male presents for their Follow-Up CCM visit with the clinical pharmacist via telephone due to COVID-19 Pandemic.  PCP : Colon Branch, MD  Their chronic conditions include: Aflutter, Pre-Diabetes, COPD, Tobacco Use Disorder, Hypertension, Hyperlipidemia/CAD, Depression/Anxiety, Overactive Bladder, GERD, Chronic Pain  Office Visits: None since last CCM visit on 01/29/20.   Consult Visit: None since last CCM visit on 01/29/20.    Allergies  Allergen Reactions  . Tussionex Pennkinetic Er [Hydrocod Polst-Cpm Polst Er] Hives  . Albuterol Palpitations  . Qvar [Beclomethasone] Palpitations    Medications: Outpatient Encounter Medications as of 02/28/2020  Medication Sig  . ALPRAZolam (XANAX) 0.5 MG tablet Take 1 tablet (0.5 mg total) by mouth 2 (two) times daily as needed for anxiety.  Marland Kitchen atorvastatin (LIPITOR) 80 MG tablet Take 1 tablet (80 mg total) by mouth daily.  . carisoprodol (SOMA) 350 MG tablet TAKE ONE TABLET BY MOUTH THREE TIMES A [name] AS NEEDED FOR MUSCLE SPASMS  . chlorpheniramine (EQ CHLORTABS) 4 MG tablet Take 8 mg by mouth daily as needed for allergies or rhinitis.  . Cholecalciferol (VITAMIN D3 PO) Take 2,000-5,000 Units by mouth daily.   . diclofenac sodium (VOLTAREN) 1 % GEL Apply 4 g topically 4 (four) times daily as needed.  . ezetimibe (ZETIA) 10 MG tablet Take 1 tablet (10 mg total) by mouth daily.  Marland Kitchen FLUoxetine (PROZAC) 20 MG capsule Take 1 capsule (20 mg total) by mouth daily.  Marland Kitchen glucose blood test strip Use as instructed (Patient not taking: Reported on 01/22/2020)  . HYDROcodone-acetaminophen (NORCO/VICODIN) 5-325 MG tablet TAKE 1 TABLET BY MOUTH AT BEDTIME  . metoprolol succinate (TOPROL-XL) 50 MG 24 hr tablet Take 1.5 tablets (75 mg total) by mouth daily. With or immediately following a meal.  . Multiple Vitamin  (MULTIVITAMIN) tablet Take 1 tablet by mouth daily. Calcium, Magnesium, Zinc  . omeprazole (PRILOSEC) 20 MG capsule Take 1 capsule (20 mg total) by mouth daily.  Marland Kitchen oxybutynin (DITROPAN XL) 15 MG 24 hr tablet Take 15 mg by mouth at bedtime. Increased from 10mg  by Urologist  . rivaroxaban (XARELTO) 20 MG TABS tablet Take 1 tablet (20 mg total) by mouth daily with supper.  . sildenafil (VIAGRA) 100 MG tablet Take 100 mg by mouth daily as needed for erectile dysfunction.   No facility-administered encounter medications on file as of 02/28/2020.   SDOH Screenings   Alcohol Screen:   . Last Alcohol Screening Score (AUDIT): Not on file  Depression (PHQ2-9): Low Risk   . PHQ-2 Score: 3  Financial Resource Strain: Medium Risk  . Difficulty of Paying Living Expenses: Somewhat hard  Food Insecurity: No Food Insecurity  . Worried About Charity fundraiser in the Last Year: Never true  . Ran Out of Food in the Last Year: Never true  Housing: Low Risk   . Last Housing Risk Score: 0  Physical Activity:   . Days of Exercise per Week: Not on file  . Minutes of Exercise per Session: Not on file  Social Connections:   . Frequency of Communication with Friends and Family: Not on file  . Frequency of Social Gatherings with Friends and Family: Not on file  . Attends Religious Services: Not on file  . Active Member of Clubs or Organizations: Not on file  . Attends Archivist Meetings: Not on  file  . Marital Status: Not on file  Stress:   . Feeling of Stress : Not on file  Tobacco Use: High Risk  . Smoking Tobacco Use: Current Every Brendan [name] Smoker  . Smokeless Tobacco Use: Never Used  Transportation Needs: No Transportation Needs  . Lack of Transportation (Medical): No  . Lack of Transportation (Non-Medical): No     Current Diagnosis/Assessment:  Social Hx:  Has been a patient since 30 or 62. Lives alone so feels like he can do what he wants. [name] years then moved to this  area for work. Completely retired. 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  Wife had a genetic disorder. She died 2 years ago. They were married 29 years.  No children together. Wife had 3 children. He has no children of his own.  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 He enjoys working with children.  Mom was born in [place], but he never visited the state. Born in [place], [place].  Update 02/28/20 Has a lady that he is considering to allow to move in to help him with recovery post shoulder surgery.  Goals Addressed            This Visit's Progress   . Chronic Care Management Pharmacy Care Plan       CARE PLAN ENTRY (see longitudinal plan of care for additional care plan information)  Current Barriers:  . Chronic Disease Management support, education, and care coordination needs related to Aflutter, Pre-Diabetes, COPD, Tobacco Use Disorder, Hypertension, Hyperlipidemia/CAD, Depression/Anxiety, Overactive Bladder, GERD, Chronic Pain   Hypertension BP Readings from Last 3 Encounters:  01/22/20 122/79  12/14/19 126/87  09/21/19 129/84   . Pharmacist Clinical Goal(s): o Over the next 90 days, patient will work with PharmD and providers to maintain BP goal <140/90 . Current regimen:  o Metoprolol XL 50 mg 1.5 tabs daily  . Patient self care activities - Over the next 90 days, patient will: o Maintain hypertension medication regimen.  Hyperlipidemia Lab Results  Component Value Date/Time   LDLCALC 70 01/22/2020 10:28 AM   LDLCALC 60 12/14/2018 11:20 AM   LDLDIRECT 109.0 07/12/2017 11:04 AM   . Pharmacist Clinical Goal(s): o Over the next 90 days, patient will work with PharmD and providers to maintain LDL goal < 70 . Current regimen:  . Atorvastatin 80 mg daily . Ezetimibe 10 mg daily  . Interventions: o Discussed LDL goal . Patient self care activities - Over the next 90 days, patient will: o Maintain cholesterol medication  regimen. o   Diabetes Lab Results  Component Value Date/Time   HGBA1C 6.2 01/22/2020 10:28 AM   HGBA1C 6.2 09/21/2019 02:36 PM   . Pharmacist Clinical Goal(s): o Over the next 90 days, patient will work with PharmD and providers to maintain A1c goal <6.5% . Current regimen:  o Diet and exercise management   . Interventions: o Discussed how long term corticosteroid use could increase a1c . Patient self care activities - Over the next 90 days, patient will: o Maintain a1c less than 6.5%  GERD . Pharmacist Clinical Goal(s) o Over the next 90 days, patient will work with PharmD and providers to reduce symptoms of GERD and reduce polypharmacy . Current regimen:  o Omeprazole 20 mg daily  . Interventions: o Discussed the risk/benefit of continuation vs discontinuation of long term PPI use  o Consider completing DEXA Scan . Patient self care activities - Over the next 90 days, patient will:  o Consider repeat DEXA  Depression/Anxiety . Pharmacist Clinical Goal(s) o Over the next 90 days, patient will work with PharmD and providers to reduce symptoms of depression/anxiety . Current regimen:  . Alprazolam 0.5 mg BID PRN  . Fluoxetine 20 mg daily AM . Interventions: o Continue tapering plan as tolerated . Patient self care activities - Over the next 90 days, patient will: o Maintain depression/anxiety medication regimen.    Tobacco Use Disorder . Pharmacist Clinical Goal(s) o Over the next 90 days, patient will work with PharmD and providers to stop smoking . Current regimen:  . Nicotine 21mg  patches (#14) . Nicotine 4mg  gum (#110) . Interventions: o Will help patient with accountability during smoking cessation process  o New quit date of 03/08/2020 . Patient self care activities - Over the next 90 days, patient will: o Stop smoking   Health Maintenance  . Pharmacist Clinical Goal(s) o Over the next 180 days, patient will work with PharmD and providers to complete health  maintenance screenings/vaccinations . Interventions: o Recommend patient receive Td booster around 05/2020 o Discussion about long term steroid use and PPI on bone health  o Consider getting DEXA Scan  . Patient self care activities - Over the next 180 days, patient will: o Get Td booster  Medication management . Pharmacist Clinical Goal(s): o Over the next 90 days, patient will work with PharmD and providers to maintain optimal medication adherence . Current pharmacy: [facility] . Interventions o Comprehensive medication review performed. o Continue current medication management strategy . Patient self care activities - Over the next 90 days, patient will: o Focus on medication adherence by filling and taking medications appropriately  o Take medications as prescribed o Report any questions or concerns to PharmD and/or provider(s)  Please see past updates related to this goal by clicking on the "Past Updates" button in the selected goal         Tobacco Abuse   Tobacco Status:  Social History   Tobacco Use  Smoking Status Current Every Jodeci Roarty Smoker  . Packs/Kyri Dai: 0.50  . Years: 51.00  . Pack years: 25.50  . Types: Cigarettes  . Start date: 1970  Smokeless Tobacco Never Used  Tobacco Comment   Still smoking max of #15/Lanetta Figuero    Patient smokes After 30 minutes of waking (within in hour) Patient triggers include: driving and drinking coffee On a scale of 1-10, reports MOTIVATION to quit is 6-7 (feels he needs to more than he wants to) On a scale of 1-10, reports CONFIDENCE in quitting is 8-9 (feeling he needs to score of 10)  Previous quit attempts included: None noted  Patient is currently uncontrolled on the following medications:  . Nicotine 21mg  patches (#14) . Nicotine 4mg  gum (#110)  States he enjoyed his birthday, but did not stop smoking due to people gifting him cigarettes. This is his first real attempt at quitting. Has only used one patch. He is not a  huge fan of gum at current moment, but has it available for cravings. Feels it is difficult to use the "park" method for the gum. Is connected with [place] [facility].  Cleaned out ash trays and lighters.  He is considering October 1st as his new quit date.   We discussed:  Patient set quit date of 03/08/2020. Counseled on patch placement, side effects, and option to remove at night if they experience trouble sleeping or bad dreams.  Counseled on park & chew method for NRT gum.  Plan -  Continue current medications   Depression / Anxiety   PHQ9 Score:  PHQ9 SCORE ONLY 12/20/2019 09/22/2019 06/26/2019  PHQ-9 Total Score 3 0 5   GAD7 Score: No flowsheet data found.  Patient has failed these meds in past: None noted  Patient is currently controlled on the following medications:  . Alprazolam 0.5 mg BID PRN  . Fluoxetine 20 mg daily AM  Patient received this when he lost his wife. He's not sure if this helps him or not. Alprazolam: Was using three times daily and patient wanted to move to twice daily. Originally prescribed by Dr. Michelle Nasuti due to concern for anxiety attack when patient states he was actually having Aflutter. He did notice benefit of them. He is interested in going back to three daily potentially. Feels "antsy".  Interested in D/C fluoxetine pending clinical team decision.  Will perform PHQ9 at next visit and consult with Dr. Larose Kells about potential tapering plan.   Update 12/20/19 PHQ9: 3 Will wait to consider tapering plan until further analysis with upcoming visits.   Update 01/29/20 We discussed tapering option of fluoxetine and his smoking cessation plan.  He is fearful of gaining weight with smoking cessation. He is using NCquits with free nicotine patches and gums.  Discussed that he should probably wait to taper fluoxetine until after he has stopped smoking for 3-4 months. Patient is agreeable to this plan.   Update 02/28/20 Has started taking fluoxetine every other Kue Fox and  feels fine.  Denies feeling any increase in anxiety or depression  Plan -Continue fluoxetine taper as tolerated. Will follow closely with patient for this and smoking cessation.   GERD   Patient has failed these meds in past: None noted  Patient is currently controlled on the following medications:  . Omeprazole 20 mg daily   Problem Story Patient didn't feel he had specific GERD symptoms Feels he received omeprazole from Dr. Michelle Nasuti due to taking a lot of medications and not wanting to get upset stomach. Pt was interested in tapering off of PPI, but did not tolerate taper. Recommended DEXA Scan, but pt concern with cost  Update 02/28/20 Still taking omeprazole daily  Plan -Continue current medications    Future Plan -Implement tapering plan noting to use famotidine on days not taking omeprazole as patient did not tolerate initial tapering plan. Week 1: Take every other Poseidon [name] Week 2: Take every 3rd Creedon Danielski (2 days between doses) Week 3: Take every 4th Brandie Lopes/Twice a week (3 days between doses) Week: 4: Take once a week Week 5: Discontinue  Pain   Patient has failed these meds in past: Etodolac (concern with anticoag) Patient is currently uncontrolled on the following medications:  Carisoprodol 350 mg TID PRN  (uses once daily at night)  Voltaren 1% gel QID PRN   Hydrocodone-APAP 5-235 mg QHS   Tylenol 500mg  #1 twice daily AM, Lunch  Problem Story Pt has knee, wrist, and shoulder pain. This would prevent him from sleeping. Acute osteoarthritis in both shoulders. Was told he needs to have both shoulders replaced, but was told it would take 13 weeks to recover and he wouldn't like to do that.  He is receiving corticosteroid injections. These work great. He has no pain for 4-5 months.   Update 01/29/20 Feels steroid injections are not working as well as they did in the past. He is interested in Financial risk analyst with [employer] [facility]. We discussed possible benefit, but that patient should  have specific questions answered at his free consultation. (covered  by insurance, length of therapy, what specific therapies used, etc).   Update 02/28/20 Went to [facility]. Was told it would cost $8,000-11,000 with no guaranteed benefits. Patient is now considering shoulder procedure. R>L. He is hesitant about procedure. He doesn't want to go to rehab center and be limited. He is hoping procedure can be done outpatient and that he can rehab at home.  Plan -Continue current medications   Vaccines   Reviewed and discussed patient's vaccination history.    Immunization History  Administered Date(s) Administered  . Fluad Quad(high Dose 65+) 02/10/2019  . Influenza Split 06/05/2012, 06/20/2013, 04/10/2014  . Influenza Whole 04/08/2009, 04/22/2010, 02/11/2011  . Influenza, High Dose Seasonal PF 03/02/2016  . Influenza-Unspecified 03/28/2015, 03/17/2017, 03/10/2018  . PFIZER SARS-COV-2 Vaccination 06/29/2019, 07/19/2019  . Pneumococcal Conjugate-13 04/27/2014  . Pneumococcal Polysaccharide-23 05/19/2010, 01/22/2020  . Td 05/19/2010  . Zoster 12/25/2013  . Zoster Recombinat (Shingrix) 04/06/2019, 06/18/2019    Plan -Recommended patient receive Td vaccine in pharmacy/office around 05/2020.   [place], PharmD Clinical Pharmacist [facility] at [facility] [phone]

## 2020-03-01 DIAGNOSIS — M12811 Other specific arthropathies, not elsewhere classified, right shoulder: Secondary | ICD-10-CM | POA: Diagnosis not present

## 2020-03-01 DIAGNOSIS — M75101 Unspecified rotator cuff tear or rupture of right shoulder, not specified as traumatic: Secondary | ICD-10-CM | POA: Diagnosis not present

## 2020-03-01 DIAGNOSIS — M19011 Primary osteoarthritis, right shoulder: Secondary | ICD-10-CM | POA: Diagnosis not present

## 2020-03-07 ENCOUNTER — Telehealth: Payer: Self-pay

## 2020-03-07 DIAGNOSIS — F341 Dysthymic disorder: Secondary | ICD-10-CM

## 2020-03-07 MED ORDER — ALPRAZOLAM 0.5 MG PO TABS: 0.5000 mg | ORAL_TABLET | Freq: Two times a day (BID) | ORAL | 0 refills | Status: DC | PRN

## 2020-03-07 NOTE — Telephone Encounter (Signed)
Alprazolam refill.   Last OV: 01/22/2020 Last Fill: 01/02/2020 #180 and 0RF Pt sig: 1 tab bid prn UDS: 06/26/2019 Low risk

## 2020-03-07 NOTE — Telephone Encounter (Signed)
10 days ago he got a 30-day supply but is requesting a 90-day supply now.  Will go ahead and do it.

## 2020-03-09 ENCOUNTER — Encounter: Payer: Self-pay | Admitting: Internal Medicine

## 2020-03-10 ENCOUNTER — Telehealth: Payer: Self-pay | Admitting: Family Medicine

## 2020-03-11 ENCOUNTER — Other Ambulatory Visit: Payer: Self-pay | Admitting: Neurology

## 2020-03-11 DIAGNOSIS — G600 Hereditary motor and sensory neuropathy: Secondary | ICD-10-CM

## 2020-03-11 NOTE — Telephone Encounter (Signed)
PDMP okay, Rx sent 

## 2020-03-11 NOTE — Telephone Encounter (Signed)
Nurse Assessment Nurse: [name], RN, [name] Date/Time [name] Time): 03-12-20 2:38:40 PM Confirm and document reason for call. If symptomatic, describe symptoms. ---PT is getting Rt shoulder replacement soon, shoulder pain will not let him sleep and the pain is very bad. Pt reports he injured his shoulder in his sleep 5 days ago. Hydrocodone taken at bedtime, but no relief. Requesting pain meds. Does the patient have any new or worsening symptoms? ---Yes Will a triage be completed? ---Yes Related visit to physician within the last 2 weeks? ---Yes Does the PT have any chronic conditions? (i.e. diabetes, asthma, this includes High risk factors for pregnancy, etc.) ---Yes List chronic conditions. ---Pre-DM, Cardiac stents x2 Is this a behavioral health or substance abuse call? ---No Guidelines Guideline Title Affirmed Question Affirmed Notes Nurse Date/Time [name] Time) Shoulder Pain [1] SEVERE pain AND [2] not improved 2 hours after pain medicine [name], Organ, [name] 2020/03/12 2:44:24 PM Disp. Time [name] Time) Disposition Final User 03-12-20 2:51:12 PM Go to ED Now (or PCP triage) Yes [name], RN, [name] Caller Disagree/Comply Disagree Caller Understands Yes PLEASE NOTE: All timestamps contained within this report are represented as [place] Standard Time. CONFIDENTIALTY NOTICE: This fax transmission is intended only for the addressee. It contains information that is legally privileged, confidential or otherwise protected from use or disclosure. If you are not the intended recipient, you are strictly prohibited from reviewing, disclosing, copying using or disseminating any of this information or taking any action in reliance on or regarding this information. If you have received this fax in error, please notify us immediately by telephone so that we can arrange for its return to [place]. Phone: [phone], Toll-Free: [phone], Fax: [phone] Page: 2 of 2 Call Id:  [id number] PreDisposition [name] Advice Given Per Guideline GO TO ED NOW (OR PCP TRIAGE): CARE ADVICE given per Shoulder Pain (Adult) guideline Comments User: [name], [name], RN Date/Time [name] Time): 03/12/2020 2:42:42 PM Taking deceased wife's trazadone and oxycodone rx meds. Dr. Brain Hilts is orthopedist User: [name], [name], RN Date/Time [name] Time): March 12, 2020 2:44:13 PM pt has COPD User: [name], [name], RN Date/Time [name] Time): Mar 12, 2020 2:52:55 PM Pt refuses recommendations to go to UC/ED. States he will continue home care. Referrals GO TO FACILITY REFUSED

## 2020-03-11 NOTE — Telephone Encounter (Signed)
Requesting: hydrocodone 5-325mg  Contract: 08/17/2018 UDS: 06/26/2019 Low risk Last Visit: 01/22/2020 Next Visit: 07/24/2020 Last Refill: 02/09/2020 #30 and 0RF Pt sig: 1 tab qhs  Please Advise

## 2020-03-12 DIAGNOSIS — M778 Other enthesopathies, not elsewhere classified: Secondary | ICD-10-CM | POA: Diagnosis not present

## 2020-03-12 DIAGNOSIS — M24011 Loose body in right shoulder: Secondary | ICD-10-CM | POA: Diagnosis not present

## 2020-03-12 DIAGNOSIS — M25411 Effusion, right shoulder: Secondary | ICD-10-CM | POA: Diagnosis not present

## 2020-03-12 DIAGNOSIS — Z01818 Encounter for other preprocedural examination: Secondary | ICD-10-CM | POA: Diagnosis not present

## 2020-03-12 DIAGNOSIS — M12811 Other specific arthropathies, not elsewhere classified, right shoulder: Secondary | ICD-10-CM | POA: Diagnosis not present

## 2020-03-12 DIAGNOSIS — M19011 Primary osteoarthritis, right shoulder: Secondary | ICD-10-CM | POA: Diagnosis not present

## 2020-03-12 DIAGNOSIS — M75101 Unspecified rotator cuff tear or rupture of right shoulder, not specified as traumatic: Secondary | ICD-10-CM | POA: Diagnosis not present

## 2020-03-12 DIAGNOSIS — M75111 Incomplete rotator cuff tear or rupture of right shoulder, not specified as traumatic: Secondary | ICD-10-CM | POA: Diagnosis not present

## 2020-03-14 ENCOUNTER — Encounter: Payer: Self-pay | Admitting: Internal Medicine

## 2020-03-15 DIAGNOSIS — M75101 Unspecified rotator cuff tear or rupture of right shoulder, not specified as traumatic: Secondary | ICD-10-CM | POA: Diagnosis not present

## 2020-03-15 DIAGNOSIS — M12811 Other specific arthropathies, not elsewhere classified, right shoulder: Secondary | ICD-10-CM | POA: Diagnosis not present

## 2020-03-15 DIAGNOSIS — M19011 Primary osteoarthritis, right shoulder: Secondary | ICD-10-CM | POA: Diagnosis not present

## 2020-03-18 ENCOUNTER — Other Ambulatory Visit: Payer: Self-pay | Admitting: Internal Medicine

## 2020-03-18 DIAGNOSIS — I10 Essential (primary) hypertension: Secondary | ICD-10-CM

## 2020-03-19 ENCOUNTER — Telehealth: Payer: Self-pay | Admitting: Pharmacist

## 2020-03-19 NOTE — Progress Notes (Addendum)
Chronic Care Management Pharmacy Assistant   Name: Brendan [name]  MRN: [id number] DOB: 1951-01-23  Reason for Encounter: Medication Review/ General Adherence  Patient Questions:  1.  Have you seen any other providers since your last visit? No  2.  Any changes in your medicines or health? No     PCP : Colon Branch, MD  Allergies:   Allergies  Allergen Reactions  . Tussionex Pennkinetic Er [Hydrocod Polst-Cpm Polst Er] Hives  . Albuterol Palpitations  . Qvar [Beclomethasone] Palpitations    Medications: Outpatient Encounter Medications as of 03/19/2020  Medication Sig  . ALPRAZolam (XANAX) 0.5 MG tablet Take 1 tablet (0.5 mg total) by mouth 2 (two) times daily as needed for anxiety.  Marland Kitchen atorvastatin (LIPITOR) 80 MG tablet Take 1 tablet (80 mg total) by mouth daily.  . carisoprodol (SOMA) 350 MG tablet TAKE ONE TABLET BY MOUTH THREE TIMES A DAY AS NEEDED FOR MUSCLE SPASMS  . chlorpheniramine (EQ CHLORTABS) 4 MG tablet Take 8 mg by mouth daily as needed for allergies or rhinitis.  . Cholecalciferol (VITAMIN D3 PO) Take 2,000-5,000 Units by mouth daily.   . diclofenac sodium (VOLTAREN) 1 % GEL Apply 4 g topically 4 (four) times daily as needed.  . ezetimibe (ZETIA) 10 MG tablet Take 1 tablet (10 mg total) by mouth daily.  Marland Kitchen FLUoxetine (PROZAC) 20 MG capsule Take 1 capsule (20 mg total) by mouth daily.  Marland Kitchen glucose blood test strip Use as instructed (Patient not taking: Reported on 01/22/2020)  . HYDROcodone-acetaminophen (NORCO/VICODIN) 5-325 MG tablet TAKE 1 TABLET BY MOUTH AT BEDTIME  . metoprolol succinate (TOPROL-XL) 50 MG 24 hr tablet Take 1.5 tablets (75 mg total) by mouth daily. Take with or immediately following a meal.  . Multiple Vitamin (MULTIVITAMIN) tablet Take 1 tablet by mouth daily. Calcium, Magnesium, Zinc  . omeprazole (PRILOSEC) 20 MG capsule Take 1 capsule (20 mg total) by mouth daily.  Marland Kitchen oxybutynin (DITROPAN XL) 15 MG 24 hr tablet Take 15 mg by mouth at  bedtime. Increased from 10mg  by Urologist  . rivaroxaban (XARELTO) 20 MG TABS tablet Take 1 tablet (20 mg total) by mouth daily with supper.  . sildenafil (VIAGRA) 100 MG tablet Take 100 mg by mouth daily as needed for erectile dysfunction.   No facility-administered encounter medications on file as of 03/19/2020.    Current Diagnosis: Patient Active Problem List   Diagnosis Date Noted  . Nonintractable episodic headache 12/14/2019  . Controlled diabetes mellitus type 2 with complications ([place]) 93/57/0177  . COPD mixed type ([place]) 04/18/2018  . OSA (obstructive sleep apnea) 10/16/2015  . Bruit 04/24/2015  . PCP NOTES >>>>>> 02/25/2015  . Hx of colonic polyps 06/25/2014  . Sinus bradycardia 06/19/2014  . Annual physical exam >>>>>>>>>>>>>>>>>> 04/28/2014  . Ejection fraction   . GERD (gastroesophageal reflux disease) 02/11/2011  . Paroxysmal atrial flutter ([place])   . HTN (hypertension)   . CAD (coronary artery disease)   . Dyslipidemia   . Obesity   . B12 deficiency   . VITAMIN D DEFICIENCY 05/20/2009  . Polyneuropathy 09/10/2008  . BACK PAIN, LUMBAR--UDS 08/03/2008  . ERECTILE DYSFUNCTION 02/09/2008  . BPH (benign prostatic hyperplasia) 02/09/2008  . COLONIC POLYPS 07/23/2007  . CIGARETTE SMOKER 07/23/2007  . Solitary pulmonary nodule-chest x-ray stable 08-2014, no further schedule imaging 07/23/2007  . DIVERTICULOSIS OF COLON 07/23/2007  . ANXIETY DEPRESSION 07/08/2007    Goals Addressed   None    Called patient and discussed medication  adherence with patient.  Patient states he will be having right shoulder surgery.  States he is taking antiinflammatory to help with pain.  Patient states provider is waiting on results from the MRI and CT scan.  States he has to sleep in a recliner to help with pain.  States hydrocodone-acetaminophen is not working for shoulder pain.  States he has taken this med for years now.  States he has not smoked since first of October.  States the  patches are working well.  Patient denies any side effects with his medication. Patient denies any problems with current pharmacy.  Follow-Up:  Pharmacist Review   [place] Shannon, [facility] [place] Pharmacist Assistant [phone]  Excited that patient has stopped smoking. Will continue to follow.  Reviewed by: De Blanch, PharmD Clinical Pharmacist [facility] [phone]

## 2020-03-22 NOTE — Progress Notes (Signed)
HPI: FU atrial fibrillation and CAD. Cardiac catheterization performed in 2001 because of abnormal nuclear study showed a 70% LAD, 80% circumflex and 80% right coronary artery. LV function normal. The patient had PCI of his circumflex and right coronary artery. Event monitor January 2016 showed episode of coarse atrial fibrillation versus flutter. Abdominal ultrasound December 2016 showed no aneurysm.Echocardiogram October 2018 showed normal LV function and grade 1 diastolic dysfunction. Nuclear study October 2018 showed ejection fraction 57% and normal perfusion. CTA July 2021 showed occlusion of the right vertebral artery felt to be chronic.  Since last seen,he has some dyspnea on exertion but no orthopnea, PND, pedal edema, chest pain or syncope.  He is scheduled to have upcoming shoulder replacement surgery.  Current Outpatient Medications  Medication Sig Dispense Refill  . ALPRAZolam (XANAX) 0.5 MG tablet Take 1 tablet (0.5 mg total) by mouth 2 (two) times daily as needed for anxiety. 180 tablet 0  . atorvastatin (LIPITOR) 80 MG tablet Take 1 tablet (80 mg total) by mouth daily. 90 tablet 1  . carisoprodol (SOMA) 350 MG tablet TAKE ONE TABLET BY MOUTH THREE TIMES A DAY AS NEEDED FOR MUSCLE SPASMS 90 tablet 1  . chlorpheniramine (EQ CHLORTABS) 4 MG tablet Take 8 mg by mouth daily as needed for allergies or rhinitis.    . Cholecalciferol (VITAMIN D3 PO) Take 2,000-5,000 Units by mouth daily.     . diclofenac sodium (VOLTAREN) 1 % GEL Apply 4 g topically 4 (four) times daily as needed. 100 g 3  . ezetimibe (ZETIA) 10 MG tablet Take 1 tablet (10 mg total) by mouth daily. 90 tablet 3  . FLUoxetine (PROZAC) 20 MG capsule Take 1 capsule (20 mg total) by mouth daily. 90 capsule 1  . glucose blood test strip Use as instructed 100 each 12  . HYDROcodone-acetaminophen (NORCO/VICODIN) 5-325 MG tablet TAKE 1 TABLET BY MOUTH AT BEDTIME 30 tablet 0  . metoprolol succinate (TOPROL-XL) 50 MG 24 hr  tablet Take 1.5 tablets (75 mg total) by mouth daily. Take with or immediately following a meal. 135 tablet 1  . Multiple Vitamin (MULTIVITAMIN) tablet Take 1 tablet by mouth daily. Calcium, Magnesium, Zinc    . omeprazole (PRILOSEC) 20 MG capsule Take 1 capsule (20 mg total) by mouth daily. 90 capsule 1  . oxybutynin (DITROPAN XL) 15 MG 24 hr tablet Take 15 mg by mouth at bedtime. Increased from 10mg  by Urologist    . rivaroxaban (XARELTO) 20 MG TABS tablet Take 1 tablet (20 mg total) by mouth daily with supper. 90 tablet 3  . sildenafil (VIAGRA) 100 MG tablet Take 100 mg by mouth daily as needed for erectile dysfunction.     No current facility-administered medications for this visit.     Past Medical History:  Diagnosis Date  . Allergy   . Anxiety and depression   . Arthritis   . Atrial flutter [facility])    New diagnosis November 10, 2010, rate controlled  . B12 deficiency   . Blurred vision    episode with confusion, evaluated Dr. Erling Cruz  . BPH (benign prostatic hypertrophy)   . CAD (coronary artery disease)    stent, 2001 / nuclear, June, 2010, no ischemia  . Diverticulosis of colon   . Dyslipidemia   . ED (erectile dysfunction)   . Ejection fraction   . GERD (gastroesophageal reflux disease)    pt denies, take omeprazole d/t taking several medications not GERD per pt  . History of  colonic polyps   . HTN (hypertension)   . Lumbar back pain   . Obesity   . OSA (obstructive sleep apnea)   . PAF (paroxysmal atrial fibrillation) ([place])   . Peripheral neuropathy    lower extremities  . Prediabetes   . Pulmonary nodule   . Scalp lesion    November, 2011  . Sinus bradycardia   . Stroke ([place])    tia  . Tobacco use    hx of  . Vitamin D deficiency     Past Surgical History:  Procedure Laterality Date  . arm fracture surgery     remotely  . cardica stent x2    . CLAVICLE SURGERY     remotely  . TONSILLECTOMY      Social History   Socioeconomic History  . Marital status:  Widowed    Spouse name: Not on file  . Number of children: 0  . Years of education: Not on file  . Highest education level: Not on file  Occupational History  . Occupation: retired---[employer]    Employer: [employer]  Tobacco Use  . Smoking status: Current Every Day Smoker    Packs/day: 0.50    Years: 51.00    Pack years: 25.50    Types: Cigarettes    Start date: 3  . Smokeless tobacco: Never Used  . Tobacco comment: Still smoking max of #15/day  Substance and Sexual Activity  . Alcohol use: Yes    Alcohol/week: 4.0 standard drinks    Types: 4 Standard drinks or equivalent per week    Comment: social  . Drug use: No  . Sexual activity: Not Currently  Other Topics Concern  . Not on file  Social History Narrative   Wife had muscle dystrophy, lost wife ~10/2017   Now lives in a smaller house, by himself    Right handed   Caffeine: 1/2 caf 2-3 large cups coffee everyday   Social Determinants of Health   Financial Resource Strain: Medium Risk  . Difficulty of Paying Living Expenses: Somewhat hard  Food Insecurity: No Food Insecurity  . Worried About Charity fundraiser in the Last Year: Never true  . Ran Out of Food in the Last Year: Never true  Transportation Needs: No Transportation Needs  . Lack of Transportation (Medical): No  . Lack of Transportation (Non-Medical): No  Physical Activity:   . Days of Exercise per Week: Not on file  . Minutes of Exercise per Session: Not on file  Stress:   . Feeling of Stress : Not on file  Social Connections:   . Frequency of Communication with Friends and Family: Not on file  . Frequency of Social Gatherings with Friends and Family: Not on file  . Attends Religious Services: Not on file  . Active Member of Clubs or Organizations: Not on file  . Attends Archivist Meetings: Not on file  . Marital Status: Not on file  Intimate Partner Violence:   . Fear of Current or Ex-Partner: Not on file  . Emotionally  Abused: Not on file  . Physically Abused: Not on file  . Sexually Abused: Not on file    Family History  Problem Relation Age of Onset  . Stroke Mother   . Alzheimer's disease Mother        M, brother; passed from this   . Heart failure Father   . Stroke Father   . Alzheimer's disease Paternal Aunt   . Alzheimer's disease  Brother        passed from this   . Alzheimer's disease Maternal Grandmother        passed from this   . Dementia Brother   . Colon cancer Neg Hx   . Esophageal cancer Neg Hx   . Rectal cancer Neg Hx   . Stomach cancer Neg Hx   . Prostate cancer Neg Hx   . IgA nephropathy Neg Hx   . Headache Neg Hx     ROS: Right shoulder pain but no fevers or chills, productive cough, hemoptysis, dysphasia, odynophagia, melena, hematochezia, dysuria, hematuria, rash, seizure activity, orthopnea, PND, pedal edema, claudication. Remaining systems are negative.  Physical Exam: Well-developed well-nourished in no acute distress.  Skin is warm and dry.  HEENT is normal.  Neck is supple.  Chest is clear to auscultation with normal expansion.  Cardiovascular exam is regular rate and rhythm.  Abdominal exam nontender or distended. No masses palpated. Extremities show no edema. neuro grossly intact  ECG-sinus bradycardia, left bundle branch block.  Personally reviewed  A/P  1 coronary artery disease-patient doing well with no chest pain.  Plan to continue medical therapy.  Continue statin.  No aspirin given need for anticoagulation.  2 paroxysmal atrial fibrillation-he remains in sinus rhythm.  We will continue present dose of beta-blocker and Xarelto.  Hold Xarelto 3 days prior to shoulder replacement surgery and resume after when okay with orthopedics.  3 hypertension-blood pressure controlled.  Continue present medications and follow.  4 hyperlipidemia-continue statin.    5 tobacco abuse-patient counseled on discontinuing.  6 preoperative evaluation prior to shoulder  surgery-patient is not as functional as he was previously.  He has a new left bundle branch block on his electrocardiogram.  I will arrange a [place] nuclear study for risk stratification preoperatively.  Kirk Ruths, MD

## 2020-03-25 ENCOUNTER — Telehealth: Payer: Self-pay | Admitting: Pharmacist

## 2020-03-25 NOTE — Progress Notes (Addendum)
Chronic Care Management Pharmacy Assistant   Name: Brendan [name]  MRN: [id number] DOB: Sep 05, 1950  Reason for Encounter: General Adherence Call  PCP : Colon Branch, MD   Their chronic conditions include: Aflutter, Pre-Diabetes, COPD, Tobacco Use Disorder, Hypertension, Hyperlipidemia/CAD, Depression/Anxiety, Overactive Bladder, GERD, Chronic Pain  Allergies:   Allergies  Allergen Reactions   Tussionex Pennkinetic Er [Hydrocod Polst-Cpm Polst Er] Hives   Albuterol Palpitations   Qvar [Beclomethasone] Palpitations    Medications: Outpatient Encounter Medications as of 03/25/2020  Medication Sig   ALPRAZolam (XANAX) 0.5 MG tablet Take 1 tablet (0.5 mg total) by mouth 2 (two) times daily as needed for anxiety.   atorvastatin (LIPITOR) 80 MG tablet Take 1 tablet (80 mg total) by mouth daily.   carisoprodol (SOMA) 350 MG tablet TAKE ONE TABLET BY MOUTH THREE TIMES A DAY AS NEEDED FOR MUSCLE SPASMS   chlorpheniramine (EQ CHLORTABS) 4 MG tablet Take 8 mg by mouth daily as needed for allergies or rhinitis.   Cholecalciferol (VITAMIN D3 PO) Take 2,000-5,000 Units by mouth daily.    diclofenac sodium (VOLTAREN) 1 % GEL Apply 4 g topically 4 (four) times daily as needed.   ezetimibe (ZETIA) 10 MG tablet Take 1 tablet (10 mg total) by mouth daily.   FLUoxetine (PROZAC) 20 MG capsule Take 1 capsule (20 mg total) by mouth daily.   glucose blood test strip Use as instructed (Patient not taking: Reported on 01/22/2020)   HYDROcodone-acetaminophen (NORCO/VICODIN) 5-325 MG tablet TAKE 1 TABLET BY MOUTH AT BEDTIME   metoprolol succinate (TOPROL-XL) 50 MG 24 hr tablet Take 1.5 tablets (75 mg total) by mouth daily. Take with or immediately following a meal.   Multiple Vitamin (MULTIVITAMIN) tablet Take 1 tablet by mouth daily. Calcium, Magnesium, Zinc   omeprazole (PRILOSEC) 20 MG capsule Take 1 capsule (20 mg total) by mouth daily.   oxybutynin (DITROPAN XL) 15 MG 24 hr tablet  Take 15 mg by mouth at bedtime. Increased from 10mg  by Urologist   rivaroxaban (XARELTO) 20 MG TABS tablet Take 1 tablet (20 mg total) by mouth daily with supper.   sildenafil (VIAGRA) 100 MG tablet Take 100 mg by mouth daily as needed for erectile dysfunction.   No facility-administered encounter medications on file as of 03/25/2020.    Current Diagnosis: Patient Active Problem List   Diagnosis Date Noted   Nonintractable episodic headache 12/14/2019   Controlled diabetes mellitus type 2 with complications ([place]) 21/22/4825   COPD mixed type ([place]) 04/18/2018   OSA (obstructive sleep apnea) 10/16/2015   Bruit 04/24/2015   PCP NOTES >>>>>> 02/25/2015   Hx of colonic polyps 06/25/2014   Sinus bradycardia 06/19/2014   Annual physical exam >>>>>>>>>>>>>>>>>> 04/28/2014   Ejection fraction    GERD (gastroesophageal reflux disease) 02/11/2011   Paroxysmal atrial flutter ([facility])    HTN (hypertension)    CAD (coronary artery disease)    Dyslipidemia    Obesity    B12 deficiency    VITAMIN D DEFICIENCY 05/20/2009   Polyneuropathy 09/10/2008   BACK PAIN, LUMBAR--UDS 08/03/2008   ERECTILE DYSFUNCTION 02/09/2008   BPH (benign prostatic hyperplasia) 02/09/2008   COLONIC POLYPS 07/23/2007   CIGARETTE SMOKER 07/23/2007   Solitary pulmonary nodule-chest x-ray stable 08-2014, no further schedule imaging 07/23/2007   DIVERTICULOSIS OF COLON 07/23/2007   ANXIETY DEPRESSION 07/08/2007    Goals Addressed   None    NO OV's or hospitalizations since their last CCM visit with the clinical pharmacist.   Consults: 03-15-2020 (  Ortho): Patient presented in the office with Dr. Smitty Cords f/u on right shoulder surgical consult. Patient provided informed consent for RIGHT reverse total shoulder arthroplasty.  03-12-2020 (Ortho): Patient presented in the office with Dr. Smitty Cords for arthritis of right glenohumeral joint and rotator cuff tear arthropathy of right  shoulder. 03-06-2020 (Ortho) Pre-Admission testing and consult for RIGHT SHOULDER ARTHROPLASTY TOTAL.  03-01-2020 (Ortho) Patient presented in the office with Dr. Smitty Cords for arthritis of right glenohumeral joint and rotator cuff tear arthropathy of right shoulder.   Contacted the patient for general adherence check. Patient is preparing for his shoulder surgery. He states he has been in so much pain as of late and unable to sleep. Patient reported taking Trazodone for insomnia due to pain. He inquired if there was any other medication he could be prescribed prior to his surgery. I advised that he contact Dr. Larose Kells for additional support with pain medication. He stated his orthopedic surgeon prescribed him Tramadol, but he does not want to take it based on his friends and family's experiences.   Follow-Up:  Pharmacist Review and Scheduled Follow-Up With Clinical Pharmacist for November 22nd at 1:30 mg over the telephone.  Fanny Skates, [place] Pharmacist Assistant [phone]  Reviewed by: De Blanch, PharmD Clinical Pharmacist [facility] [phone]

## 2020-03-26 ENCOUNTER — Encounter: Payer: Self-pay | Admitting: Internal Medicine

## 2020-03-27 ENCOUNTER — Other Ambulatory Visit: Payer: Self-pay

## 2020-03-27 ENCOUNTER — Encounter: Payer: Self-pay | Admitting: *Deleted

## 2020-03-27 ENCOUNTER — Ambulatory Visit (INDEPENDENT_AMBULATORY_CARE_PROVIDER_SITE_OTHER): Payer: Medicare HMO | Admitting: Cardiology

## 2020-03-27 ENCOUNTER — Encounter: Payer: Self-pay | Admitting: Cardiology

## 2020-03-27 VITALS — BP 130/78 | HR 58 | Ht 78.0 in | Wt 286.0 lb

## 2020-03-27 DIAGNOSIS — Z01818 Encounter for other preprocedural examination: Secondary | ICD-10-CM

## 2020-03-27 DIAGNOSIS — I480 Paroxysmal atrial fibrillation: Secondary | ICD-10-CM | POA: Diagnosis not present

## 2020-03-27 DIAGNOSIS — E7800 Pure hypercholesterolemia, unspecified: Secondary | ICD-10-CM | POA: Diagnosis not present

## 2020-03-27 DIAGNOSIS — I2510 Atherosclerotic heart disease of native coronary artery without angina pectoris: Secondary | ICD-10-CM

## 2020-03-27 DIAGNOSIS — I10 Essential (primary) hypertension: Secondary | ICD-10-CM | POA: Diagnosis not present

## 2020-03-27 DIAGNOSIS — Z01810 Encounter for preprocedural cardiovascular examination: Secondary | ICD-10-CM | POA: Diagnosis not present

## 2020-03-27 NOTE — Patient Instructions (Signed)
  Testing/Procedures:  Your physician has requested that you have a lexiscan myoview. For further information please visit [email or web address]. Please follow instruction sheet, as given. [address]   Follow-Up: At [facility], you and your health needs are our priority.  As part of our continuing mission to provide you with exceptional heart care, we have created designated Provider Care Teams.  These Care Teams include your primary Cardiologist (physician) and Advanced Practice Providers (APPs -  Physician Assistants and Nurse Practitioners) who all work together to provide you with the care you need, when you need it.  We recommend signing up for the patient portal called "MyChart".  Sign up information is provided on this After Visit Summary.  MyChart is used to connect with patients for Virtual Visits (Telemedicine).  Patients are able to view lab/test results, encounter notes, upcoming appointments, etc.  Non-urgent messages can be sent to your provider as well.   To learn more about what you can do with MyChart, go to [email or web address].    Your next appointment:   12 month(s)  The format for your next appointment:   In Person  Provider:   Brian Crenshaw, MD    

## 2020-03-28 DIAGNOSIS — Z01818 Encounter for other preprocedural examination: Secondary | ICD-10-CM | POA: Diagnosis not present

## 2020-03-28 DIAGNOSIS — Z951 Presence of aortocoronary bypass graft: Secondary | ICD-10-CM | POA: Diagnosis not present

## 2020-03-28 DIAGNOSIS — Z79899 Other long term (current) drug therapy: Secondary | ICD-10-CM | POA: Diagnosis not present

## 2020-03-28 DIAGNOSIS — I2510 Atherosclerotic heart disease of native coronary artery without angina pectoris: Secondary | ICD-10-CM | POA: Diagnosis not present

## 2020-03-28 DIAGNOSIS — F17200 Nicotine dependence, unspecified, uncomplicated: Secondary | ICD-10-CM | POA: Diagnosis not present

## 2020-03-28 DIAGNOSIS — M19011 Primary osteoarthritis, right shoulder: Secondary | ICD-10-CM | POA: Diagnosis not present

## 2020-03-28 DIAGNOSIS — M75101 Unspecified rotator cuff tear or rupture of right shoulder, not specified as traumatic: Secondary | ICD-10-CM | POA: Diagnosis not present

## 2020-03-28 DIAGNOSIS — I10 Essential (primary) hypertension: Secondary | ICD-10-CM | POA: Diagnosis not present

## 2020-03-28 DIAGNOSIS — Z01812 Encounter for preprocedural laboratory examination: Secondary | ICD-10-CM | POA: Diagnosis not present

## 2020-03-29 ENCOUNTER — Telehealth: Payer: Medicare HMO

## 2020-03-29 DIAGNOSIS — M75101 Unspecified rotator cuff tear or rupture of right shoulder, not specified as traumatic: Secondary | ICD-10-CM | POA: Diagnosis not present

## 2020-03-29 DIAGNOSIS — M12811 Other specific arthropathies, not elsewhere classified, right shoulder: Secondary | ICD-10-CM | POA: Diagnosis not present

## 2020-03-31 ENCOUNTER — Other Ambulatory Visit: Payer: Self-pay

## 2020-03-31 ENCOUNTER — Emergency Department (HOSPITAL_BASED_OUTPATIENT_CLINIC_OR_DEPARTMENT_OTHER)
Admission: EM | Admit: 2020-03-31 | Discharge: 2020-04-01 | Disposition: A | Discharge status: Home / Self Care | Payer: Medicare HMO | Attending: Emergency Medicine | Admitting: Emergency Medicine

## 2020-03-31 ENCOUNTER — Encounter (HOSPITAL_BASED_OUTPATIENT_CLINIC_OR_DEPARTMENT_OTHER): Payer: Self-pay | Admitting: Emergency Medicine

## 2020-03-31 DIAGNOSIS — I10 Essential (primary) hypertension: Secondary | ICD-10-CM | POA: Diagnosis not present

## 2020-03-31 DIAGNOSIS — J418 Mixed simple and mucopurulent chronic bronchitis: Secondary | ICD-10-CM | POA: Insufficient documentation

## 2020-03-31 DIAGNOSIS — Z7901 Long term (current) use of anticoagulants: Secondary | ICD-10-CM | POA: Insufficient documentation

## 2020-03-31 DIAGNOSIS — Z79899 Other long term (current) drug therapy: Secondary | ICD-10-CM | POA: Diagnosis not present

## 2020-03-31 DIAGNOSIS — I4892 Unspecified atrial flutter: Secondary | ICD-10-CM | POA: Insufficient documentation

## 2020-03-31 DIAGNOSIS — F17210 Nicotine dependence, cigarettes, uncomplicated: Secondary | ICD-10-CM | POA: Diagnosis not present

## 2020-03-31 DIAGNOSIS — K625 Hemorrhage of anus and rectum: Secondary | ICD-10-CM | POA: Diagnosis not present

## 2020-03-31 DIAGNOSIS — K921 Melena: Secondary | ICD-10-CM | POA: Diagnosis not present

## 2020-03-31 DIAGNOSIS — I2510 Atherosclerotic heart disease of native coronary artery without angina pectoris: Secondary | ICD-10-CM | POA: Diagnosis not present

## 2020-03-31 DIAGNOSIS — E119 Type 2 diabetes mellitus without complications: Secondary | ICD-10-CM | POA: Diagnosis not present

## 2020-03-31 DIAGNOSIS — R197 Diarrhea, unspecified: Secondary | ICD-10-CM | POA: Diagnosis present

## 2020-03-31 NOTE — ED Triage Notes (Addendum)
Reports traveling to [place] today and had episode of diarrhea. States "pitch black" stool. Denies sticky/tarry when wiping. Reports sweaty episode today, reports family told him earlier he was pale.

## 2020-04-01 ENCOUNTER — Telehealth: Payer: Self-pay

## 2020-04-01 ENCOUNTER — Telehealth (HOSPITAL_COMMUNITY): Payer: Self-pay

## 2020-04-01 ENCOUNTER — Telehealth: Payer: Self-pay | Admitting: Internal Medicine

## 2020-04-01 ENCOUNTER — Telehealth: Payer: Self-pay | Admitting: Cardiology

## 2020-04-01 DIAGNOSIS — K921 Melena: Secondary | ICD-10-CM

## 2020-04-01 LAB — CBC WITH DIFFERENTIAL/PLATELET
Abs Immature Granulocytes: 0.03 10*3/uL (ref 0.00–0.07)
Basophils Absolute: 0.1 10*3/uL (ref 0.0–0.1)
Basophils Relative: 1 %
Eosinophils Absolute: 0.2 10*3/uL (ref 0.0–0.5)
Eosinophils Relative: 3 %
HCT: 42.2 % (ref 39.0–52.0)
Hemoglobin: 14.3 g/dL (ref 13.0–17.0)
Immature Granulocytes: 0 %
Lymphocytes Relative: 38 %
Lymphs Abs: 3 10*3/uL (ref 0.7–4.0)
MCH: 31.8 pg (ref 26.0–34.0)
MCHC: 33.9 g/dL (ref 30.0–36.0)
MCV: 94 fL (ref 80.0–100.0)
Monocytes Absolute: 0.8 10*3/uL (ref 0.1–1.0)
Monocytes Relative: 10 %
Neutro Abs: 3.7 10*3/uL (ref 1.7–7.7)
Neutrophils Relative %: 48 %
Platelets: 214 10*3/uL (ref 150–400)
RBC: 4.49 MIL/uL (ref 4.22–5.81)
RDW: 13 % (ref 11.5–15.5)
WBC: 7.9 10*3/uL (ref 4.0–10.5)
nRBC: 0 % (ref 0.0–0.2)

## 2020-04-01 LAB — COMPREHENSIVE METABOLIC PANEL
ALT: 21 U/L (ref 0–44)
AST: 19 U/L (ref 15–41)
Albumin: 3.9 g/dL (ref 3.5–5.0)
Alkaline Phosphatase: 55 U/L (ref 38–126)
Anion gap: 11 (ref 5–15)
BUN: 48 mg/dL — ABNORMAL HIGH (ref 8–23)
CO2: 25 mmol/L (ref 22–32)
Calcium: 9.4 mg/dL (ref 8.9–10.3)
Chloride: 105 mmol/L (ref 98–111)
Creatinine, Ser: 0.87 mg/dL (ref 0.61–1.24)
GFR, Estimated: >60 mL/min (ref >60)
Glucose, Bld: 110 mg/dL — ABNORMAL HIGH (ref 70–99)
Potassium: 4.1 mmol/L (ref 3.5–5.1)
Sodium: 141 mmol/L (ref 135–145)
Total Bilirubin: 0.6 mg/dL (ref 0.3–1.2)
Total Protein: 6.4 g/dL — ABNORMAL LOW (ref 6.5–8.1)

## 2020-04-01 LAB — HEMOGLOBIN AND HEMATOCRIT, BLOOD
HCT: 39.4 % (ref 39.0–52.0)
Hemoglobin: 13.3 g/dL (ref 13.0–17.0)

## 2020-04-01 LAB — OCCULT BLOOD X 1 CARD TO LAB, STOOL: Fecal Occult Bld: POSITIVE — AB

## 2020-04-01 MED ORDER — PANTOPRAZOLE SODIUM 20 MG PO TBEC: 20.0000 mg | DELAYED_RELEASE_TABLET | Freq: Two times a day (BID) | ORAL | 6 refills | Status: DC

## 2020-04-01 MED ORDER — PANTOPRAZOLE SODIUM 40 MG IV SOLR
40.0000 mg | Freq: Once | INTRAVENOUS | Status: AC
  Administered 2020-04-01: 40 mg via INTRAVENOUS
  Filled 2020-04-01: qty 40

## 2020-04-01 MED ORDER — PANTOPRAZOLE SODIUM 20 MG PO TBEC: 20.0000 mg | DELAYED_RELEASE_TABLET | Freq: Two times a day (BID) | ORAL | 0 refills | Status: DC

## 2020-04-01 NOTE — Telephone Encounter (Signed)
Nurse Assessment Nurse: Alveta Heimlich, RN, Rise Paganini Date/Time (Eastern Time): 03/31/2020 10:08:19 PM Confirm and document reason for call. If symptomatic, describe symptoms. ---Caller states that today in the middle of lunch he had to go to bathroom ASAP. Explosive diarrhea came out and it was pitch black and a lot of it. He had 2 sharp pains above his belt on the drive home. He had to stop and have another episode and had another episode tonight. He was pale with cold sweats. Does the patient have any new or worsening symptoms? ---Yes Will a triage be completed? ---Yes Related visit to physician within the last 2 weeks? ---No Does the PT have any chronic conditions? (i.e. diabetes, asthma, this includes High risk factors for pregnancy, etc.) ---Yes List chronic conditions. ---HTN, having shoulder replacement surgery on Nov. 4. Is this a behavioral health or substance abuse call? ---No Guidelines Guideline Title Affirmed Question Affirmed Notes Nurse Date/Time (Eastern Time) Diarrhea Black or tarry bowel movements (Exception: chronic-unchanged blackgrey bowel movements AND is taking iron pills or Pepto-Bismol) Alveta Heimlich, RN, [facility] 03/31/2020 10:13:36 PM Disp. Time [name] Time) Disposition Final User PLEASE NOTE: All timestamps contained within this report are represented as [place] Standard Time. CONFIDENTIALTY NOTICE: This fax transmission is intended only for the addressee. It contains information that is legally privileged, confidential or otherwise protected from use or disclosure. If you are not the intended recipient, you are strictly prohibited from reviewing, disclosing, copying using or disseminating any of this information or taking any action in reliance on or regarding this information. If you have received this fax in error, please notify us immediately by telephone so that we can arrange for its return to [place]. Phone: [phone], Toll-Free: [phone], Fax: [phone] Page: 2 of  2 Call Id: [id number] 03/31/2020 10:16:29 PM Go to ED Now Yes Alveta Heimlich, RN, [facility] Lowe Disagree/Comply Comply Caller Understands Yes PreDisposition [place] Advice Given Per Guideline GO TO ED NOW: CARE ADVICE given per Diarrhea (Adult) guideline. * It is better and safer if another adult drives instead of you. Referrals GO TO FACILITY OTHER - SPECIFY  Pt seen at ED.

## 2020-04-01 NOTE — ED Provider Notes (Signed)
Brendan [name] Provider Note   CSN: [id number] Arrival date & time: 03/31/20  2327     History Chief Complaint  Patient presents with  . Diarrhea    Brendan [name] is a 69 y.o. male.  HPI     This is a 69 year old male with a history of atrial flutter on Xarelto, hypertension, hyperlipidemia, coronary artery disease who presents with dark stools.  Patient reports that he was in [place] earlier yesterday when he had a sudden urge to go to the restroom.  He noted a dark loose stool.  He has had 2-3 additional stools since returning to [employer].  Has not noted any dizziness but states that he feels generally fatigued.  Patient states that he had 2 episodes of intermittent abdominal cramping but no abdominal pain and currently does not have any pain.  No history of upper or lower GI bleeds.  He has been taking ibuprofen intermittently for right shoulder discomfort.  He is due to have a shoulder surgery on November 4.  No nausea or vomiting.  No recent fevers.  Last colonoscopy was 2016.  Past Medical History:  Diagnosis Date  . Allergy   . Anxiety and depression   . Arthritis   . Atrial flutter [facility])    New diagnosis November 10, 2010, rate controlled  . B12 deficiency   . Blurred vision    episode with confusion, evaluated Dr. Erling Cruz  . BPH (benign prostatic hypertrophy)   . CAD (coronary artery disease)    stent, 2001 / nuclear, June, 2010, no ischemia  . Diverticulosis of colon   . Dyslipidemia   . ED (erectile dysfunction)   . Ejection fraction   . GERD (gastroesophageal reflux disease)    pt denies, take omeprazole d/t taking several medications not GERD per pt  . History of colonic polyps   . HTN (hypertension)   . Lumbar back pain   . Obesity   . OSA (obstructive sleep apnea)   . PAF (paroxysmal atrial fibrillation) ([place])   . Peripheral neuropathy    lower extremities  . Prediabetes   . Pulmonary nodule   . Scalp lesion    November,  2011  . Sinus bradycardia   . Stroke ([place])    tia  . Tobacco use    hx of  . Vitamin D deficiency     Patient Active Problem List   Diagnosis Date Noted  . Nonintractable episodic headache 12/14/2019  . Controlled diabetes mellitus type 2 with complications ([place]) 32/99/2426  . COPD mixed type ([place]) 04/18/2018  . OSA (obstructive sleep apnea) 10/16/2015  . Bruit 04/24/2015  . PCP NOTES >>>>>> 02/25/2015  . Hx of colonic polyps 06/25/2014  . Sinus bradycardia 06/19/2014  . Annual physical exam >>>>>>>>>>>>>>>>>> 04/28/2014  . Ejection fraction   . GERD (gastroesophageal reflux disease) 02/11/2011  . Paroxysmal atrial flutter ([place])   . HTN (hypertension)   . CAD (coronary artery disease)   . Dyslipidemia   . Obesity   . B12 deficiency   . VITAMIN D DEFICIENCY 05/20/2009  . Polyneuropathy 09/10/2008  . BACK PAIN, LUMBAR--UDS 08/03/2008  . ERECTILE DYSFUNCTION 02/09/2008  . BPH (benign prostatic hyperplasia) 02/09/2008  . COLONIC POLYPS 07/23/2007  . CIGARETTE SMOKER 07/23/2007  . Solitary pulmonary nodule-chest x-ray stable 08-2014, no further schedule imaging 07/23/2007  . DIVERTICULOSIS OF COLON 07/23/2007  . ANXIETY DEPRESSION 07/08/2007    Past Surgical History:  Procedure Laterality Date  . arm fracture surgery  remotely  . cardica stent x2    . CLAVICLE SURGERY     remotely  . TONSILLECTOMY         Family History  Problem Relation Age of Onset  . Stroke Mother   . Alzheimer's disease Mother        M, brother; passed from this   . Heart failure Father   . Stroke Father   . Alzheimer's disease Paternal Aunt   . Alzheimer's disease Brother        passed from this   . Alzheimer's disease Maternal Grandmother        passed from this   . Dementia Brother   . Colon cancer Neg Hx   . Esophageal cancer Neg Hx   . Rectal cancer Neg Hx   . Stomach cancer Neg Hx   . Prostate cancer Neg Hx   . IgA nephropathy Neg Hx   . Headache Neg Hx     Social  History   Tobacco Use  . Smoking status: Current Every Day Smoker    Packs/day: 0.50    Years: 51.00    Pack years: 25.50    Types: Cigarettes    Start date: 77  . Smokeless tobacco: Never Used  . Tobacco comment: Still smoking max of #15/day  Substance Use Topics  . Alcohol use: Yes    Alcohol/week: 4.0 standard drinks    Types: 4 Standard drinks or equivalent per week    Comment: social  . Drug use: No    Home Medications Prior to Admission medications   Medication Sig Start Date End Date Taking? Authorizing Provider  ALPRAZolam Duanne Moron) 0.5 MG tablet Take 1 tablet (0.5 mg total) by mouth 2 (two) times daily as needed for anxiety. 03/07/20   Colon Branch, MD  atorvastatin (LIPITOR) 80 MG tablet Take 1 tablet (80 mg total) by mouth daily. 12/01/19   Colon Branch, MD  carisoprodol (SOMA) 350 MG tablet TAKE ONE TABLET BY MOUTH THREE TIMES A DAY AS NEEDED FOR MUSCLE SPASMS 01/22/20   Colon Branch, MD  chlorpheniramine (EQ CHLORTABS) 4 MG tablet Take 8 mg by mouth daily as needed for allergies or rhinitis.    [provider]  Cholecalciferol (VITAMIN D3 PO) Take 2,000-5,000 Units by mouth daily.     [provider]  diclofenac sodium (VOLTAREN) 1 % GEL Apply 4 g topically 4 (four) times daily as needed. 07/20/18   Colon Branch, MD  ezetimibe (ZETIA) 10 MG tablet Take 1 tablet (10 mg total) by mouth daily. 05/29/19   Colon Branch, MD  FLUoxetine (PROZAC) 20 MG capsule Take 1 capsule (20 mg total) by mouth daily. 01/03/20   Colon Branch, MD  glucose blood test strip Use as instructed 04/16/14   Colon Branch, MD  HYDROcodone-acetaminophen (NORCO/VICODIN) 5-325 MG tablet TAKE 1 TABLET BY MOUTH AT BEDTIME 03/11/20   Colon Branch, MD  metoprolol succinate (TOPROL-XL) 50 MG 24 hr tablet Take 1.5 tablets (75 mg total) by mouth daily. Take with or immediately following a meal. 03/18/20   Colon Branch, MD  Multiple Vitamin (MULTIVITAMIN) tablet Take 1 tablet by mouth daily. Calcium, Magnesium,  Zinc    [provider]  omeprazole (PRILOSEC) 20 MG capsule Take 1 capsule (20 mg total) by mouth daily. 01/03/20   Colon Branch, MD  oxybutynin (DITROPAN XL) 15 MG 24 hr tablet Take 15 mg by mouth at bedtime. Increased from 10mg  by Urologist  [provider]  pantoprazole (PROTONIX) 20 MG tablet Take 1 tablet (20 mg total) by mouth 2 (two) times daily. 04/01/20   Garyn Arlotta, Barbette Hair, MD  rivaroxaban (XARELTO) 20 MG TABS tablet Take 1 tablet (20 mg total) by mouth daily with supper. 04/03/19   Colon Branch, MD  sildenafil (VIAGRA) 100 MG tablet Take 100 mg by mouth daily as needed for erectile dysfunction.    [provider]    Allergies    Tussionex pennkinetic er [hydrocod polst-cpm polst er], Albuterol, and Qvar [beclomethasone]  Review of Systems   Review of Systems  Constitutional: Negative for fever.  Respiratory: Negative for shortness of breath.   Cardiovascular: Negative for chest pain.  Gastrointestinal: Positive for blood in stool. Negative for abdominal pain, constipation, diarrhea, nausea and vomiting.  Genitourinary: Negative for dysuria.  All other systems reviewed and are negative.   Physical Exam Updated Vital Signs BP 135/67   Pulse 64   Temp 98.6 F (37 C) (Oral)   Resp 15   Wt 124.1 kg   SpO2 100%   BMI 31.62 kg/m   Physical Exam Vitals and nursing note reviewed.  Constitutional:      Appearance: He is well-developed. He is not ill-appearing.  HENT:     Head: Normocephalic and atraumatic.     Mouth/Throat:     Mouth: Mucous membranes are moist.  Eyes:     Pupils: Pupils are equal, round, and reactive to light.  Cardiovascular:     Rate and Rhythm: Normal rate and regular rhythm.     Heart sounds: Normal heart sounds. No murmur heard.   Pulmonary:     Effort: Pulmonary effort is normal. No respiratory distress.     Breath sounds: Normal breath sounds. No wheezing.  Abdominal:     General: Bowel sounds are normal.      Palpations: Abdomen is soft.     Tenderness: There is no abdominal tenderness. There is no rebound.  Genitourinary:    Rectum: Guaiac result positive.     Comments: Dark stool on digital rectal exam, no gross blood noted Musculoskeletal:     Cervical back: Neck supple.     Right lower leg: No edema.     Left lower leg: No edema.  Lymphadenopathy:     Cervical: No cervical adenopathy.  Skin:    General: Skin is warm and dry.  Neurological:     Mental Status: He is alert and oriented to person, place, and time.  Psychiatric:        Mood and Affect: Mood normal.     ED Results / Procedures / Treatments   Labs (all labs ordered are listed, but only abnormal results are displayed) Labs Reviewed  COMPREHENSIVE METABOLIC PANEL - Abnormal; Notable for the following components:      Result Value   Glucose, Bld 110 (*)    BUN 48 (*)    Total Protein 6.4 (*)    All other components within normal limits  OCCULT BLOOD X 1 CARD TO LAB, STOOL - Abnormal; Notable for the following components:   Fecal Occult Bld POSITIVE (*)    All other components within normal limits  CBC WITH DIFFERENTIAL/PLATELET  HEMOGLOBIN AND HEMATOCRIT, BLOOD  POC OCCULT BLOOD, ED    EKG None  Radiology No results found.  Procedures Procedures (including critical care time)  Medications Ordered in ED Medications  pantoprazole (PROTONIX) injection 40 mg (40 mg Intravenous Given 04/01/20 0118)    ED  Course  I have reviewed the triage vital signs and the nursing notes.  Pertinent labs & imaging results that were available during my care of the patient were reviewed by me and considered in my medical decision making (see chart for details).  Clinical Course as of Apr 01 350  Mon Apr 01, 2020  0354 Patient has not had any recurrent bowel movements while in the emergency [name].  Heme positive stools but hemoglobin is normal at 14.  He is currently asymptomatic.  He took his Xarelto this evening.  We  will repeat hemoglobin for stability.  If hemoglobin remains reassuring, will have him follow-up with his gastroenterologist later today and avoid admission.  Patient is agreeable to plan.   [CH]    Clinical Course User Index [CH] Nichalos Brenton, Barbette Hair, MD   MDM Rules/Calculators/A&P                          Patient presents with dark stools.  He has several since being home.  No significant abdominal pain.  He is overall nontoxic and vital signs are reassuring.  He is hemodynamically stable.  Abdominal exam is notable for some dark stool that is heme positive.  No gross blood.  Will obtain lab work.  Patient was given IV Protonix.  Lab work-up is largely reassuring.  He has a hemoglobin of 14.3.  BUN is 48 which could indicate a GI bleed given his heme positive stools.  Favor upper versus lower.  On recheck, he has not had any recurrent stools while in the emergency [name].  We discussed options.  Given that he is hemodynamically stable with a normal hemoglobin, we will plan to repeat hemoglobin in 4 hours for stability.  As long as he remains hemodynamically stable if hemoglobin is reassuring, will plan for started on Protonix, holding Xarelto and having him call his GI doctor first thing in the morning.  Dr. Fuller Plan did his most recent colonoscopy.  3:53 AM Repeat hemoglobin is 13.3 which I think is very reassuring given that he is not having any ongoing stools.  Will have patient hold Xarelto.  Will start patient on Protonix p.o.  Recommend close GI follow-up today.  I will message his GI doctor.  Patient stated understanding and was given strict return precautions.  He is also instructed not to take any anti-inflammatories.  After history, exam, and medical workup I feel the patient has been appropriately medically screened and is safe for discharge home. Pertinent diagnoses were discussed with the patient. Patient was given return precautions.  Final Clinical Impression(s) / ED Diagnoses Final  diagnoses:  Melena    Rx / DC Orders ED Discharge Orders         Ordered    pantoprazole (PROTONIX) 20 MG tablet  2 times daily        04/01/20 0347           Swara Donze, Barbette Hair, MD 04/01/20 [phone]

## 2020-04-01 NOTE — Telephone Encounter (Signed)
Spoke with the patient and have rescheduled his stress test for 11/04.

## 2020-04-01 NOTE — Telephone Encounter (Signed)
Addendum: Received a   message from the ER MD Dr. Dina Rich, they also recommend him to hold Xarelto. In addition to referral to GI, please arrange a office visit with me 04/03/2020 or the next day.

## 2020-04-01 NOTE — Telephone Encounter (Signed)
Patient was seen at the ER with melena, that means most likely bleeding from the stomach. I agree completely with a GI referral, please send him to Dr. Lucio Edward who did his last colonoscopy (okay to see an APP). Until then , take pantoprazole 40 mg 1 tablet twice a day on empty stomach and stop omeprazole. Send a prescription. ER if ongoing dark stools, dizziness, abdominal pain, vomiting. Avoid all  oral anti-inflammatories such as ibuprofen, naproxen, meloxicam etc.

## 2020-04-01 NOTE — Telephone Encounter (Signed)
Scheduled office visit with Dr. Fuller Plan on 04/09/20 @ 8:30am. Patient aware

## 2020-04-01 NOTE — Telephone Encounter (Signed)
Pt called in stated he rec'd some news that there is a leakage in his upper digestion and they sch'd him to see GI on 11/2.  He is suppose to have a Stress test on 10/28 but concerned if he should still have this test right now.  He would like to get DR Stanford Breed advice as to weather he needs to hold off on this test til he finds out with GI what is going on ?     Best number [phone]

## 2020-04-01 NOTE — Discharge Instructions (Signed)
You were seen today for dark stools.  These were heme positive which may indicate some upper GI bleeding.  Take medication as prescribed.  Hold your Xarelto until you discuss your symptoms with your primary gastroenterologist.  Avoid any anti-inflammatory medication such as ibuprofen or Aleve.  If you note frequent worsening bloody bowel movements, dizziness, any new or worsening symptoms you should be reevaluated.

## 2020-04-01 NOTE — ED Notes (Signed)
Assisted EDP Horton with rectal exam; pt tolerated well

## 2020-04-01 NOTE — Telephone Encounter (Signed)
Please advise. Pt seen w/ Melena.

## 2020-04-01 NOTE — Telephone Encounter (Signed)
Spoke with the patient, detailed instructions were given. He stated that he has a new problem and doesn't know if he's going to have the test. He is having significant GI problems that started yesterday. Will call him back in a day or two. S.Wilberto Console EMTP

## 2020-04-01 NOTE — Telephone Encounter (Signed)
Patient states he was seeing at the [facility] last night was told to schedule appointment with the GI doctor, however, he wants to speak to someone before he schedule any appointment. I offer a hospital f/u with Dr. Larose Kells he declined.

## 2020-04-01 NOTE — Telephone Encounter (Signed)
Spoke w/ Pt- informed of recommendations. GI referral placed. Pt worried because he is scheduled for surgery on 04/11/2020- he has already spoke w/ his surgeon office today to make them aware of this. Informed that GI will call to schedule appt. Pt verbalized understanding. Pantoprazole refilled to [facility]. Omeprazole d/c from med list.

## 2020-04-02 ENCOUNTER — Telehealth: Payer: Self-pay | Admitting: Internal Medicine

## 2020-04-02 NOTE — Telephone Encounter (Signed)
Patient states he has a leak on his upper digestive system. Patient states appointment with GI is not till 04/09/20. He wants to know if he needs to stop xarelto?

## 2020-04-02 NOTE — Telephone Encounter (Signed)
Mychart message sent to Pt.

## 2020-04-02 NOTE — Telephone Encounter (Signed)
See other message

## 2020-04-03 ENCOUNTER — Ambulatory Visit (INDEPENDENT_AMBULATORY_CARE_PROVIDER_SITE_OTHER): Payer: Medicare HMO | Admitting: Internal Medicine

## 2020-04-03 ENCOUNTER — Other Ambulatory Visit: Payer: Self-pay

## 2020-04-03 ENCOUNTER — Encounter: Payer: Self-pay | Admitting: Internal Medicine

## 2020-04-03 ENCOUNTER — Telehealth: Payer: Self-pay | Admitting: Cardiology

## 2020-04-03 VITALS — BP 112/76 | HR 64 | Temp 98.0°F | Resp 18 | Ht 78.0 in | Wt 269.0 lb

## 2020-04-03 DIAGNOSIS — R634 Abnormal weight loss: Secondary | ICD-10-CM

## 2020-04-03 DIAGNOSIS — K921 Melena: Secondary | ICD-10-CM

## 2020-04-03 LAB — CBC WITH DIFFERENTIAL/PLATELET
Absolute Monocytes: 429 cells/uL (ref 200–950)
Basophils Absolute: 48 cells/uL (ref 0–200)
Basophils Relative: 0.9 %
Eosinophils Absolute: 201 cells/uL (ref 15–500)
Eosinophils Relative: 3.8 %
HCT: 39.1 % (ref 38.5–50.0)
Hemoglobin: 13.6 g/dL (ref 13.2–17.1)
Lymphs Abs: 1940 cells/uL (ref 850–3900)
MCH: 32.4 pg (ref 27.0–33.0)
MCHC: 34.8 g/dL (ref 32.0–36.0)
MCV: 93.1 fL (ref 80.0–100.0)
MPV: 9.2 fL (ref 7.5–12.5)
Monocytes Relative: 8.1 %
Neutro Abs: 2682 cells/uL (ref 1500–7800)
Neutrophils Relative %: 50.6 %
Platelets: 212 10*3/uL (ref 140–400)
RBC: 4.2 10*6/uL (ref 4.20–5.80)
RDW: 12.8 % (ref 11.0–15.0)
Total Lymphocyte: 36.6 %
WBC: 5.3 10*3/uL (ref 3.8–10.8)

## 2020-04-03 LAB — COMPREHENSIVE METABOLIC PANEL
AG Ratio: 1.9 (calc) (ref 1.0–2.5)
ALT: 23 U/L (ref 9–46)
AST: 21 U/L (ref 10–35)
Albumin: 4 g/dL (ref 3.6–5.1)
Alkaline phosphatase (APISO): 67 U/L (ref 35–144)
BUN: 17 mg/dL (ref 7–25)
CO2: 24 mmol/L (ref 20–32)
Calcium: 9.3 mg/dL (ref 8.6–10.3)
Chloride: 106 mmol/L (ref 98–110)
Creat: 0.71 mg/dL (ref 0.70–1.18)
Globulin: 2.1 g/dL (calc) (ref 1.9–3.7)
Glucose, Bld: 117 mg/dL — ABNORMAL HIGH (ref 65–99)
Potassium: 4.2 mmol/L (ref 3.5–5.3)
Sodium: 138 mmol/L (ref 135–146)
Total Bilirubin: 0.6 mg/dL (ref 0.2–1.2)
Total Protein: 6.1 g/dL (ref 6.1–8.1)

## 2020-04-03 LAB — HEMOCCULT GUIAC POC 1CARD (OFFICE): Fecal Occult Blood, POC: POSITIVE — AB

## 2020-04-03 LAB — TSH: TSH: 1.47 mIU/L (ref 0.40–4.50)

## 2020-04-03 NOTE — Telephone Encounter (Signed)
Okay to hold Xarelto for GI bleeding.  May need GI evaluation.  Resume Xarelto when hemoglobin stable and GI bleed resolves. Brendan [name]

## 2020-04-03 NOTE — Progress Notes (Signed)
Pre visit review using our clinic review tool, if applicable. No additional management support is needed unless otherwise documented below in the visit note. 

## 2020-04-03 NOTE — Patient Instructions (Addendum)
For now continue holding Xarelto  Continue pantoprazole twice a day  If you feel dizzy, you see black stools again, you have stomach pain, cramps, nausea, vomiting, feel clammy: Go to the ER.   GO TO THE LAB : Get the blood work

## 2020-04-03 NOTE — Progress Notes (Signed)
Subjective:    Patient ID: Brendan [name], male    DOB: January 16, 1951, 69 y.o.   MRN: [id number]  DOS:  04/03/2020 Type of visit - description: ER follow-up  Symptoms started 03/31/2020: He felt the slightly cold and sweaty, burp few times. Few hours later, while he was enjoying lunch he had a sudden BM described as "explosive", loose stools and "pitch black". At the time he was in [place], she drove back to [place], had another melena episode. Eventually, went to the ER 03/31/2020: CMP CBC were satisfactory. Hemoccult +. Vital signs are stable  Hemoglobin was repeated and is stable, they agreed to hold Xarelto, switch PPIs to Protonix twice daily and follow-up as an outpatient.  Wt Readings from Last 3 Encounters:  04/03/20 269 lb (122 kg)  03/31/20 273 lb 9.6 oz (124.1 kg)  03/27/20 286 lb (129.7 kg)     Review of Systems After he left the ER, the first 12 hours had 2 or 3 other BMs that were dark. Yesterday however stools were normal appearing. No bowel movement today.  He denies fever chills No chest pain no difficulty breathing Had  briefly abdominal cramps before the first melena No acid reflux, dysphagia or odynophagia. When asked, he said he did get some naproxen over-the-counter 2-3 times last week. He does not feel dizzy when he stands up.   Past Medical History:  Diagnosis Date  . Allergy   . Anxiety and depression   . Arthritis   . Atrial flutter [facility])    New diagnosis November 10, 2010, rate controlled  . B12 deficiency   . Blurred vision    episode with confusion, evaluated Dr. Erling Cruz  . BPH (benign prostatic hypertrophy)   . CAD (coronary artery disease)    stent, 2001 / nuclear, June, 2010, no ischemia  . Diverticulosis of colon   . Dyslipidemia   . ED (erectile dysfunction)   . Ejection fraction   . GERD (gastroesophageal reflux disease)    pt denies, take omeprazole d/t taking several medications not GERD per pt  . History of colonic polyps    . HTN (hypertension)   . Lumbar back pain   . Obesity   . OSA (obstructive sleep apnea)   . PAF (paroxysmal atrial fibrillation) ([place])   . Peripheral neuropathy    lower extremities  . Prediabetes   . Pulmonary nodule   . Scalp lesion    November, 2011  . Sinus bradycardia   . Stroke ([place])    tia  . Tobacco use    hx of  . Vitamin D deficiency     Past Surgical History:  Procedure Laterality Date  . arm fracture surgery     remotely  . cardica stent x2    . CLAVICLE SURGERY     remotely  . TONSILLECTOMY      Allergies as of 04/03/2020      Reactions   Tussionex Pennkinetic Er [hydrocod Polst-cpm Polst Er] Hives   Albuterol Palpitations   Qvar [beclomethasone] Palpitations      Medication List       Accurate as of April 03, 2020  3:15 PM. If you have any questions, ask your nurse or doctor.        ALPRAZolam 0.5 MG tablet Commonly known as: XANAX Take 1 tablet (0.5 mg total) by mouth 2 (two) times daily as needed for anxiety.   atorvastatin 80 MG tablet Commonly known as: LIPITOR Take 1 tablet (80 mg total)  by mouth daily.   carisoprodol 350 MG tablet Commonly known as: SOMA TAKE ONE TABLET BY MOUTH THREE TIMES A DAY AS NEEDED FOR MUSCLE SPASMS   diclofenac sodium 1 % Gel Commonly known as: VOLTAREN Apply 4 g topically 4 (four) times daily as needed.   EQ Chlortabs 4 MG tablet Generic drug: chlorpheniramine Take 8 mg by mouth daily as needed for allergies or rhinitis.   ezetimibe 10 MG tablet Commonly known as: ZETIA Take 1 tablet (10 mg total) by mouth daily.   FLUoxetine 20 MG capsule Commonly known as: PROZAC Take 1 capsule (20 mg total) by mouth daily.   glucose blood test strip Use as instructed   HYDROcodone-acetaminophen 5-325 MG tablet Commonly known as: NORCO/VICODIN TAKE 1 TABLET BY MOUTH AT BEDTIME   metoprolol succinate 50 MG 24 hr tablet Commonly known as: TOPROL-XL Take 1.5 tablets (75 mg total) by mouth daily. Take with  or immediately following a meal.   multivitamin tablet Take 1 tablet by mouth daily. Calcium, Magnesium, Zinc   oxybutynin 15 MG 24 hr tablet Commonly known as: DITROPAN XL Take 15 mg by mouth at bedtime. Increased from 10mg  by Urologist   pantoprazole 20 MG tablet Commonly known as: PROTONIX Take 1 tablet (20 mg total) by mouth 2 (two) times daily.   rivaroxaban 20 MG Tabs tablet Commonly known as: Xarelto Take 1 tablet (20 mg total) by mouth daily with supper.   sildenafil 100 MG tablet Commonly known as: VIAGRA Take 100 mg by mouth daily as needed for erectile dysfunction.   VITAMIN D3 PO Take 2,000-5,000 Units by mouth daily.          Objective:   Physical Exam BP 112/76 (BP Location: Left Arm, Patient Position: Sitting, Cuff Size: Normal)   Pulse 64   Temp 98 F (36.7 C) (Oral)   Resp 18   Ht 6\' 6"  (1.981 m)   Wt 269 lb (122 kg)   SpO2 96%   BMI 31.09 kg/m  General:   Well developed, NAD, BMI noted.  HEENT:  Normocephalic . Face symmetric, atraumatic Lungs:  CTA B Normal respiratory effort, no intercostal retractions, no accessory muscle use. Heart: RRR,  no murmur.  Abdomen:  Not distended, soft, non-tender. No rebound or rigidity. DRE: A very small amount of well-formed stool found, brown in color, stool per se was Hemoccult negative however he did have a small amount of mucus that was Hemoccult positive. Skin: Not pale. Not jaundice Lower extremities: no pretibial edema bilaterally  Neurologic:  alert & oriented X3.  Speech normal, gait appropriate for age and unassisted Psych--  Cognition and judgment appear intact.  Cooperative with normal attention span and concentration.  Behavior appropriate. No anxious or depressed appearing.     Assessment     Assessment DM (A1C increased to 6.8 on 12/2017) Peripheral neuropathy (no pain, insensitive type) HTN Hyperlipidemia Anxiety depression -- on xanax  Chronic rhinitis:  Sees ENT CV: Dr  Stanford Breed --CAD. Stress test okay 05-2015 -- Paroxysmal Atrial fibrillation --TIA --Abdominal bruit: No AAA. Ultrasound 05/2015 PULM:  OSA - + sleep study 03-2015 ,Cpap  intolerant   COPD: smoker, CT chest 12/2017 :changes noted   GU: BPH, Hypogonadism, ED. Sees urology B12 deficiency Pain mngmt: has back-shoulder  pain and neuropathy  On Soma and Vicodin. Etodolac DC 2016  per Dr Larose Kells d/t concerns regards anticoagulation. Hydrocodone dose needed to be increased Has seen Dr. Erling Cruz several times since 2010:w/u  included SPE, ACE, TSH,  B12 (slightly low with a normal methylmalonic acid) 08-2008 had a lumbar spine MRI with multilevel spondylosis. Dr. 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 Currently holding Xarelto.  PPIs switch to pantoprazole twice daily. He feels well. Orthostatic vital signs: BP did not drop, he is on beta-blockers. Brown stools today on rectal exam, he had some mucus that was Hemoccult positive. Has an appointment to see GI 04/09/2020 Stat CMP, CBC. Discussed with primary cardiologist, rec to resume Xarelto when GI bleed resolved hemoglobin is stable ER if melena again No NSAIDs, continue Protonix, continue holding Xarelto Weight loss: Incidentally the patient has noted weight loss, denies fever chills, headaches, no previous GI symptoms before melena.  Check a TSH, recommend to track his weight at home and discuss with GI    This visit occurred during the SARS-CoV-2 public health emergency.  Safety protocols were in place, including screening questions prior to the visit, additional usage of staff PPE, and extensive cleaning of exam room while observing appropriate contact time as  indicated for disinfecting solutions.

## 2020-04-03 NOTE — Telephone Encounter (Signed)
Spoke with dr Larose Kells, Aware of dr Jacalyn Lefevre recommendations.

## 2020-04-03 NOTE — Telephone Encounter (Signed)
Kayla with [facility] - Dr. Ethel Rana office is requesting to speak with Dr. Stanford Breed. She states that patient has been experiencing GI bleeding and Dr. Larose Kells is inquiring about possibility of holding Xarelto. Please return call to discuss. Dr. Ethel Rana personal cell phone number may be found in Debra's secure chat.

## 2020-04-04 ENCOUNTER — Encounter (HOSPITAL_COMMUNITY): Payer: Medicare HMO

## 2020-04-04 NOTE — Assessment & Plan Note (Signed)
Melena: As described above, started 3 days ago, lasted about 24 hours, in the context of anticoagulation and the patient taking 3 naproxens the week prior. Currently holding Xarelto.  PPIs switch to pantoprazole twice daily. He feels well. Orthostatic vital signs: BP did not drop, he is on beta-blockers. Brown stools today on rectal exam, he had some mucus that was Hemoccult positive. Has an appointment to see GI 04/09/2020 Stat CMP, CBC. Discussed with primary cardiologist, rec to resume Xarelto when GI bleed resolved hemoglobin is stable ER if melena again No NSAIDs, continue Protonix, continue holding Xarelto Weight loss: Incidentally the patient has noted weight loss, denies fever chills, headaches, no previous GI symptoms before melena.  Check a TSH, recommend to track his weight at home and discuss with GI

## 2020-04-08 ENCOUNTER — Telehealth (HOSPITAL_COMMUNITY): Payer: Self-pay | Admitting: *Deleted

## 2020-04-08 NOTE — Telephone Encounter (Signed)
Patient given detailed instructions per Myocardial Perfusion Study Information Sheet for the test on 04/12/20 at 10:30. Patient notified to arrive 15 minutes early and that it is imperative to arrive on time for appointment to keep from having the test rescheduled.  If you need to cancel or reschedule your appointment, please call the office within 24 hours of your appointment. . Patient verbalized understanding.Brendan [name]

## 2020-04-09 ENCOUNTER — Telehealth: Payer: Self-pay | Admitting: Cardiology

## 2020-04-09 ENCOUNTER — Telehealth: Payer: Self-pay

## 2020-04-09 ENCOUNTER — Ambulatory Visit (INDEPENDENT_AMBULATORY_CARE_PROVIDER_SITE_OTHER): Payer: Medicare HMO | Admitting: Gastroenterology

## 2020-04-09 ENCOUNTER — Encounter: Payer: Self-pay | Admitting: Gastroenterology

## 2020-04-09 VITALS — BP 110/70 | HR 64 | Ht 78.0 in | Wt 272.0 lb

## 2020-04-09 DIAGNOSIS — Z7901 Long term (current) use of anticoagulants: Secondary | ICD-10-CM

## 2020-04-09 DIAGNOSIS — K921 Melena: Secondary | ICD-10-CM | POA: Diagnosis not present

## 2020-04-09 NOTE — Telephone Encounter (Signed)
New Message:      Pt wants to find out from Dr Stanford Breed if he thinks he should have his Stress Test on Friday or wait until he gets his Endo on 04-18-20?

## 2020-04-09 NOTE — Progress Notes (Signed)
History of Present Illness: This is a 69 year old male referred by Colon Branch, MD for the evaluation of melena. ED evaluation on 10/25 for melena on Brendan [name] was taking Naprosyn. Dark heme positive stool noted on DRE. Hgb dropped to 13.3 from baseline around 16. Xarelto was held for several days and was restarted yesterday. Saw Dr. Larose Kells on 10/27. Stools have turned brown over the past couple days. Hgb stable at 13.6 on 10/27. He has noted weight fluctuations over the past couple weeks on several different scales. His weight at home has been around 265-267.  No other complaints. Colonoscopy performed in February 2016 for CRC screening showed mild sigmoid colon diverticulosis and internal hemorrhoids. He wants to reschedule shoulder surgery in the next few weeks. Preoperative stress test is planned for later this week with Dr. Stanford Breed.  Denies weight loss, abdominal pain, constipation, diarrhea, change in stool caliber, hematochezia, nausea, vomiting, dysphagia, reflux symptoms, chest pain.   Allergies  Allergen Reactions   Tussionex Pennkinetic Er [Hydrocod Polst-Cpm Polst Er] Hives    No current issues, only had issues when he was 69 years old   Albuterol Palpitations   Qvar [Beclomethasone] Palpitations   Outpatient Medications Prior to Visit  Medication Sig Dispense Refill   ALPRAZolam (XANAX) 0.5 MG tablet Take 1 tablet (0.5 mg total) by mouth 2 (two) times daily as needed for anxiety. 180 tablet 0   atorvastatin (LIPITOR) 80 MG tablet Take 1 tablet (80 mg total) by mouth daily. 90 tablet 1   carisoprodol (SOMA) 350 MG tablet TAKE ONE TABLET BY MOUTH THREE TIMES A DAY AS NEEDED FOR MUSCLE SPASMS 90 tablet 1   chlorpheniramine (EQ CHLORTABS) 4 MG tablet Take 8 mg by mouth daily as needed for allergies or rhinitis.      Cholecalciferol (VITAMIN D3 PO) Take 2,000-5,000 Units by mouth daily.      diclofenac sodium (VOLTAREN) 1 % GEL Apply 4 g topically 4 (four) times daily as needed.  100 g 3   ezetimibe (ZETIA) 10 MG tablet Take 1 tablet (10 mg total) by mouth daily. 90 tablet 3   FLUoxetine (PROZAC) 20 MG capsule Take 1 capsule (20 mg total) by mouth daily. 90 capsule 1   HYDROcodone-acetaminophen (NORCO/VICODIN) 5-325 MG tablet TAKE 1 TABLET BY MOUTH AT BEDTIME 30 tablet 0   Multiple Vitamin (MULTIVITAMIN) tablet Take 1 tablet by mouth daily. Calcium, Magnesium, Zinc     oxybutynin (DITROPAN XL) 15 MG 24 hr tablet Take 15 mg by mouth at bedtime. Increased from 10mg  by Urologist     pantoprazole (PROTONIX) 20 MG tablet Take 1 tablet (20 mg total) by mouth 2 (two) times daily. 60 tablet 6   rivaroxaban (XARELTO) 20 MG TABS tablet Take 1 tablet (20 mg total) by mouth daily with supper. 90 tablet 3   sildenafil (VIAGRA) 100 MG tablet Take 100 mg by mouth daily as needed for erectile dysfunction.     glucose blood test strip Use as instructed (Patient not taking: Reported on 04/03/2020) 100 each 12   metoprolol succinate (TOPROL-XL) 50 MG 24 hr tablet Take 1.5 tablets (75 mg total) by mouth daily. Take with or immediately following a meal. 135 tablet 1   No facility-administered medications prior to visit.   Past Medical History:  Diagnosis Date   Allergy    Anxiety and depression    Arthritis    Atrial flutter [facility])    New diagnosis November 10, 2010, rate controlled  B12 deficiency    Blurred vision    episode with confusion, evaluated Dr. Erling Cruz   BPH (benign prostatic hypertrophy)    CAD (coronary artery disease)    stent, 2001 / nuclear, June, 2010, no ischemia   Diverticulosis of colon    Dyslipidemia    ED (erectile dysfunction)    Ejection fraction    GERD (gastroesophageal reflux disease)    pt denies, take omeprazole d/t taking several medications not GERD per pt   History of colonic polyps    HTN (hypertension)    Lumbar back pain    Obesity    OSA (obstructive sleep apnea)    PAF (paroxysmal atrial fibrillation) (HCC)     Peripheral neuropathy    lower extremities   Prediabetes    Pulmonary nodule    Scalp lesion    November, 2011   Sinus bradycardia    Stroke ([place])    tia   Tobacco use    hx of   Vitamin D deficiency    Past Surgical History:  Procedure Laterality Date   arm fracture surgery     remotely   cardica stent x2     CLAVICLE SURGERY     remotely   TONSILLECTOMY     Social History   Socioeconomic History   Marital status: Widowed    Spouse name: Not on file   Number of children: 0   Years of education: Not on file   Highest education level: Not on file  Occupational History   Occupation: retired---[employer]    Employer: [employer]  Tobacco Use   Smoking status: Current Every Day Smoker    Packs/day: 0.50    Years: 51.00    Pack years: 25.50    Types: Cigarettes    Start date: 1970   Smokeless tobacco: Never Used   Tobacco comment: Still smoking max of #15/day  Vaping Use   Vaping Use: Never used  Substance and Sexual Activity   Alcohol use: Yes    Alcohol/week: 4.0 standard drinks    Types: 4 Standard drinks or equivalent per week    Comment: social   Drug use: No   Sexual activity: Not Currently  Other Topics Concern   Not on file  Social History Narrative   Wife had muscle dystrophy, lost wife ~10/2017   Now lives in a smaller house, by himself    Right handed   Caffeine: 1/2 caf 2-3 large cups coffee everyday   Social Determinants of Health   Financial Resource Strain: Medium Risk   Difficulty of Paying Living Expenses: Somewhat hard  Food Insecurity: No Food Insecurity   Worried About Charity fundraiser in the Last Year: Never true   Ran Out of Food in the Last Year: Never true  Transportation Needs: No Transportation Needs   Lack of Transportation (Medical): No   Lack of Transportation (Non-Medical): No  Physical Activity:    Days of Exercise per Week: Not on file   Minutes of Exercise per Session: Not on  file  Stress:    Feeling of Stress : Not on file  Social Connections:    Frequency of Communication with Friends and Family: Not on file   Frequency of Social Gatherings with Friends and Family: Not on file   Attends Religious Services: Not on file   Active Member of Clubs or Organizations: Not on file   Attends Archivist Meetings: Not on file   Marital Status: Not on  file   Family History  Problem Relation Age of Onset   Stroke Mother    Alzheimer's disease Mother        M, brother; passed from this    Heart failure Father    Stroke Father    Alzheimer's disease Paternal Aunt    Alzheimer's disease Brother        passed from this    Alzheimer's disease Maternal Grandmother        passed from this    Dementia Brother    Colon cancer Neg Hx    Esophageal cancer Neg Hx    Rectal cancer Neg Hx    Stomach cancer Neg Hx    Prostate cancer Neg Hx    IgA nephropathy Neg Hx    Headache Neg Hx       Review of Systems: Pertinent positive and negative review of systems were noted in the above HPI section. All other review of systems were otherwise negative.    Physical Exam: General: Well developed, well nourished, no acute distress Head: Normocephalic and atraumatic Eyes:  sclerae anicteric, EOMI Ears: Normal auditory acuity Mouth: Not examined, mask on during Covid-19 pandemic Neck: Supple, no masses or thyromegaly Lungs: Clear throughout to auscultation Heart: Regular rate and rhythm; no murmurs, rubs or bruits Abdomen: Soft, non tender and non distended. No masses, hepatosplenomegaly or hernias noted. Normal Bowel sounds Rectal: Not done, recent DRE no lesion, melena, heme + stool Musculoskeletal: Symmetrical with no gross deformities  Skin: No lesions on visible extremities Pulses:  Normal pulses noted Extremities: No clubbing, cyanosis, edema or deformities noted Neurological: Alert oriented x 4, grossly nonfocal Cervical Nodes:  No  significant cervical adenopathy Inguinal Nodes: No significant inguinal adenopathy Psychological:  Alert and cooperative. Normal mood and affect   Assessment and Recommendations:  1. Melena presumed UGI bleed. Rule out ulcer, gastritis, AVMs and other lesions. Schedule EGD. Continue pantoprazole 20 mg po bid. No ASA/NSAIDs. The risks (including bleeding, perforation, infection, missed lesions, medication reactions and possible hospitalization or surgery if complications occur), benefits, and alternatives to endoscopy with possible biopsy and possible dilation were discussed with the patient and they consent to proceed.   2. A flutter. Hold Xarelto 2 days before procedure - will instruct when and how to resume after procedure. Low but real risk of cardiovascular event such as heart attack, stroke, embolism, thrombosis or ischemia/infarct of other organs off Xarleto explained and need to seek urgent help if this occurs. The patient consents to proceed. Will communicate by phone or EMR with patient's prescribing provider to confirm that holding Xarelto is reasonable in this case. He is advised to contact Dr. Stanford Breed regarding his upcoming stress test in light of recent GI bleed.  3. CRC screening, average risk. A 10-year interval colonoscopy is recommended in February 2026.    cc: Colon Branch, MD [address] [place],  [place] [address]

## 2020-04-09 NOTE — Telephone Encounter (Signed)
Pt called back returning [place] call   [phone]

## 2020-04-09 NOTE — Telephone Encounter (Signed)
Clinical pharmacist to review Xarelto 

## 2020-04-09 NOTE — Telephone Encounter (Signed)
[facility] Pre-operative Risk Assessment     Request for surgical clearance:     Endoscopy Procedure  What type of surgery is being performed?     EGD  When is this surgery scheduled?     04/18/20  What type of clearance is required ?   Pharmacy  Are there any medications that need to be held prior to surgery and how long? Xarelto x 2 days  Practice name and name of physician performing surgery?      [facility]  What is your office phone and fax number?      Phone- [phone]  Fax906 [phone]  Anesthesia type (None, local, MAC, general) ?       MAC

## 2020-04-09 NOTE — Telephone Encounter (Signed)
Spoke with pt, he was recently found to have bleeding in his stomach and the shoulder surgery is on hold until the current problem is resolved. Nuclear stress test cancelled and patient will let me know when he needs to reschedule. He is aware he will need a stress test prior to any shoulder surgery.

## 2020-04-09 NOTE — Telephone Encounter (Signed)
Patient with diagnosis of afib on Xarelto for anticoagulation.    Procedure: EGD Date of procedure: 04/18/20    CHA2DS2-VASc Score = 5  This indicates a 7.2% annual risk of stroke. The patient's score is based upon: CHF History: 0 HTN History: 1 Diabetes History: 0 Stroke History: 2 Vascular Disease History: 1 Age Score: 1 Gender Score: 0      Patient has prediabetes- last A1C 6.2 on no medications  CrCl 140 ml/min  Patient was cleared by Dr. Stanford Breed in Oct 21 (03/27/20 OV note) to hold Xarelto for 3 days prior to shoulder surgery. Therefore, patient may hold Xarelto for 2 days prior to EGD.

## 2020-04-09 NOTE — Telephone Encounter (Signed)
Left message for patient, he will need both testing done to get him ready for surgery. He is to call back with questions or concerns.

## 2020-04-09 NOTE — Telephone Encounter (Signed)
Patient informed to hold Xarelto per Cardiology 2 days prior to his EGD. Patient verbalized understanding.

## 2020-04-09 NOTE — Patient Instructions (Signed)
You have been scheduled for an endoscopy. Please follow written instructions given to you at your visit today. If you use inhalers (even only as needed), please bring them with you on the day of your procedure.   Normal BMI (Body Mass Index- based on height and weight) is between 23 and 30. Your BMI today is Body mass index is 31.43 kg/m. Marland Kitchen Please consider follow up  regarding your BMI with your Primary Care Provider.  Thank you for choosing me and [facility].  Pricilla Riffle. Dagoberto Ligas., MD., Marval Regal

## 2020-04-12 ENCOUNTER — Encounter (HOSPITAL_COMMUNITY): Payer: Medicare HMO

## 2020-04-14 ENCOUNTER — Telehealth: Payer: Self-pay | Admitting: Internal Medicine

## 2020-04-15 NOTE — Telephone Encounter (Signed)
PDMP okay, 90-day supply sent 

## 2020-04-15 NOTE — Telephone Encounter (Signed)
Requesting: hydrocodone 5-325mg  Contract: 08/17/2018 UDS: 06/26/2019 Last Visit: 04/03/2020 Next Visit: 07/24/20  Last Refill: 03/11/2020 #30 and 0RF Pt sig: 1 tab qhs  Please Advise

## 2020-04-18 ENCOUNTER — Encounter: Payer: Self-pay | Admitting: Gastroenterology

## 2020-04-18 ENCOUNTER — Other Ambulatory Visit: Payer: Self-pay

## 2020-04-18 ENCOUNTER — Ambulatory Visit (AMBULATORY_SURGERY_CENTER): Payer: Medicare HMO | Admitting: Gastroenterology

## 2020-04-18 VITALS — BP 124/74 | HR 66 | Temp 97.3°F | Resp 12 | Ht 78.0 in | Wt 272.0 lb

## 2020-04-18 DIAGNOSIS — K2970 Gastritis, unspecified, without bleeding: Secondary | ICD-10-CM | POA: Diagnosis not present

## 2020-04-18 DIAGNOSIS — K2091 Esophagitis, unspecified with bleeding: Secondary | ICD-10-CM | POA: Diagnosis not present

## 2020-04-18 DIAGNOSIS — K229 Disease of esophagus, unspecified: Secondary | ICD-10-CM

## 2020-04-18 DIAGNOSIS — K219 Gastro-esophageal reflux disease without esophagitis: Secondary | ICD-10-CM | POA: Diagnosis not present

## 2020-04-18 DIAGNOSIS — K921 Melena: Secondary | ICD-10-CM

## 2020-04-18 DIAGNOSIS — K209 Esophagitis, unspecified without bleeding: Secondary | ICD-10-CM | POA: Diagnosis not present

## 2020-04-18 DIAGNOSIS — K2951 Unspecified chronic gastritis with bleeding: Secondary | ICD-10-CM | POA: Diagnosis not present

## 2020-04-18 DIAGNOSIS — K319 Disease of stomach and duodenum, unspecified: Secondary | ICD-10-CM

## 2020-04-18 MED ORDER — SODIUM CHLORIDE 0.9 % IV SOLN: 500.0000 mL | INTRAVENOUS | Status: DC

## 2020-04-18 MED ORDER — PANTOPRAZOLE SODIUM 40 MG PO TBEC: 40.0000 mg | DELAYED_RELEASE_TABLET | Freq: Two times a day (BID) | ORAL | 3 refills | Status: DC

## 2020-04-18 NOTE — Progress Notes (Signed)
Lidocaine buffer  robinol antisialogogue 

## 2020-04-18 NOTE — Patient Instructions (Signed)
Discharge instructions given. Prescription sent to pharmacy as ordered. Office will scheduled Abd [place], evaluate liver. No aspirin,ibuprofen,naproxen,or other non-steroidal anti-inflammatory drugs. Resume Xarelto at prior dose  In 2 days. Refer to managing physcian for further adjustment of therapy. YOU HAD AN ENDOSCOPIC PROCEDURE TODAY AT [facility]:   Refer to the procedure report that was given to you for any specific questions about what was found during the examination.  If the procedure report does not answer your questions, please call your gastroenterologist to clarify.  If you requested that your care partner not be given the details of your procedure findings, then the procedure report has been included in a sealed envelope for you to review at your convenience later.  YOU SHOULD EXPECT: Some feelings of bloating in the abdomen. Passage of more gas than usual.  Walking can help get rid of the air that was put into your GI tract during the procedure and reduce the bloating. If you had a lower endoscopy (such as a colonoscopy or flexible sigmoidoscopy) you may notice spotting of blood in your stool or on the toilet paper. If you underwent a bowel prep for your procedure, you may not have a normal bowel movement for a few days.  Please Note:  You might notice some irritation and congestion in your nose or some drainage.  This is from the oxygen used during your procedure.  There is no need for concern and it should clear up in a day or so.  SYMPTOMS TO REPORT IMMEDIATELY:    Following upper endoscopy (EGD)  Vomiting of blood or coffee ground material  New chest pain or pain under the shoulder blades  Painful or persistently difficult swallowing  New shortness of breath  Fever of 100F or higher  Black, tarry-looking stools  For urgent or emergent issues, a gastroenterologist can be reached at any hour by calling [phone]. Do not use MyChart messaging for urgent  concerns.    DIET:  We do recommend a small meal at first, but then you may proceed to your regular diet.  Drink plenty of fluids but you should avoid alcoholic beverages for 24 hours.  ACTIVITY:  You should plan to take it easy for the rest of today and you should NOT DRIVE or use heavy machinery until tomorrow (because of the sedation medicines used during the test).    FOLLOW UP: Our staff will call the number listed on your records 48-72 hours following your procedure to check on you and address any questions or concerns that you may have regarding the information given to you following your procedure. If we do not reach you, we will leave a message.  We will attempt to reach you two times.  During this call, we will ask if you have developed any symptoms of COVID 19. If you develop any symptoms (ie: fever, flu-like symptoms, shortness of breath, cough etc.) before then, please call [phone].  If you test positive for Covid 19 in the 2 weeks post procedure, please call and report this information to [place].    If any biopsies were taken you will be contacted by phone or by letter within the next 1-3 weeks.  Please call us at [phone] if you have not heard about the biopsies in 3 weeks.    SIGNATURES/CONFIDENTIALITY: You and/or your care partner have signed paperwork which will be entered into your electronic medical record.  These signatures attest to the fact that that the information above  on your After Visit Summary has been reviewed and is understood.  Full responsibility of the confidentiality of this discharge information lies with you and/or your care-partner.

## 2020-04-18 NOTE — Progress Notes (Signed)
Called to room to assist during endoscopic procedure.  Patient ID and intended procedure confirmed with present staff. Received instructions for my participation in the procedure from the performing physician.  

## 2020-04-18 NOTE — Progress Notes (Signed)
A and O x3. Report to RN. Tolerated MAC anesthesia well.Teeth unchanged after procedure.

## 2020-04-18 NOTE — Op Note (Signed)
[place] Patient Name: Brendan [name] Procedure Date: 04/18/2020 11:08 AM MRN: [id number] Endoscopist: Ladene Artist , MD Age: 69 Referring MD:  Date of Birth: 04/19/1951 Gender: Male Account #: [id number] Procedure:                Upper GI endoscopy Indications:              Melena Medicines:                Monitored Anesthesia Care Procedure:                Pre-Anesthesia Assessment:                           - Prior to the procedure, a History and Physical                            was performed, and patient medications and                            allergies were reviewed. The patient's tolerance of                            previous anesthesia was also reviewed. The risks                            and benefits of the procedure and the sedation                            options and risks were discussed with the patient.                            All questions were answered, and informed consent                            was obtained. Prior Anticoagulants: The patient has                            taken Xarelto (rivaroxaban), last dose was 2 days                            prior to procedure. ASA Grade Assessment: III - A                            patient with severe systemic disease. After                            reviewing the risks and benefits, the patient was                            deemed in satisfactory condition to undergo the                            procedure.  After obtaining informed consent, the endoscope was                            passed under direct vision. Throughout the                            procedure, the patient's blood pressure, pulse, and                            oxygen saturations were monitored continuously. The                            Endoscope was introduced through the mouth, and                            advanced to the second part of duodenum. The upper                            GI  endoscopy was accomplished without difficulty.                            The patient tolerated the procedure well. Scope In: Scope Out: Findings:                 Localized, yellow plaques were found in the distal                            esophagus. Biopsies were taken with a cold forceps                            for histology.                           The exam of the esophagus was otherwise normal.                           Diffuse severe inflammation characterized by                            erythema, friability and granularity was found in                            the gastric fundus and in the gastric body.                            Biopsies were taken with a cold forceps for                            histology.                           Patchy mildly erythematous mucosa without bleeding                            was found in the gastric antrum. Biopsies  were                            taken with a cold forceps for histology.                           The exam of the stomach was otherwise normal.                           The duodenal bulb was normal.                           A few localized erosions without bleeding were                            found in the second portion of the duodenum. Complications:            No immediate complications. Estimated Blood Loss:     Estimated blood loss was minimal. Impression:               - Esophageal plaques were found in the distal                            esophagus, doubt candidiasis. Biopsied.                           - Gastritis. Biopsied.                           - Erythematous mucosa in the antrum. Biopsied.                           - Normal duodenal bulb.                           - Duodenal erosions without bleeding. Recommendation:           - Patient has a contact number available for                            emergencies. The signs and symptoms of potential                            delayed complications were  discussed with the                            patient. Return to normal activities tomorrow.                            Written discharge instructions were provided to the                            patient.                           - Resume previous diet.                           -  Continue present medications.                           - Change pantoprazole to 40 mg po bid, 1 year of                            refills.                           - Await pathology results.                           - Schedule abd [place], evaluate liver.                           - No aspirin, ibuprofen, naproxen, or other                            non-steroidal anti-inflammatory drugs.                           - Resume Xarelto (rivaroxaban) at prior dose in 2                            days. Refer to managing physician for further                            adjustment of therapy. Ladene Artist, MD 04/18/2020 11:28:20 AM This report has been signed electronically.

## 2020-04-22 ENCOUNTER — Telehealth: Payer: Self-pay

## 2020-04-22 NOTE — Telephone Encounter (Signed)
Per procedure reports Dr, Fuller Plan ordered a [place].  Discussed with Dr. Fuller Plan.  Cancel order for [place].  Dr. Fuller Plan will review the pathology and decide on [place] based on the results. Patient notified of the change in plan of care.

## 2020-04-22 NOTE — Telephone Encounter (Signed)
  Follow up Call-  Call back number 04/18/2020  Post procedure Call Back phone  # [phone]  Permission to leave phone message Yes  Some recent data might be hidden     Patient questions:  Do you have a fever, pain , or abdominal swelling? No. Pain Score  0 *  Have you tolerated food without any problems? Yes.    Have you been able to return to your normal activities? Yes.    Do you have any questions about your discharge instructions: Diet   No. Medications  No. Follow up visit  No.  Do you have questions or concerns about your Care? No.  Actions: * If pain score is 4 or above: No action needed, pain <4.  1. Have you developed a fever since your procedure? no  2.   Have you had an respiratory symptoms (SOB or cough) since your procedure? no  3.   Have you tested positive for COVID 19 since your procedure no  4.   Have you had any family members/close contacts diagnosed with the COVID 19 since your procedure?  no   If yes to any of these questions please route to Joylene John, RN and Joella Prince, RN

## 2020-04-23 ENCOUNTER — Other Ambulatory Visit: Payer: Self-pay | Admitting: Internal Medicine

## 2020-04-23 ENCOUNTER — Encounter: Payer: Self-pay | Admitting: Internal Medicine

## 2020-04-25 ENCOUNTER — Telehealth: Payer: Self-pay | Admitting: Internal Medicine

## 2020-04-25 NOTE — Telephone Encounter (Signed)
Will attempt a Tier exception. Form completed on Covermymeds; KEY: [id number]. Awaiting determination.

## 2020-04-25 NOTE — Telephone Encounter (Signed)
See message from patient. Please contact his insurance, could we extend the coverage as tier 1? Perhaps under his insurance Eliquis is less costly? JP  Is there a less expensive med for Xarelto yet, such as a generic?  If so, would you suggest it for me?  If the answer is no, then will you please contact [place] and request another year of approved coverage of Xarelto to be covered as a Tier 1 copay due to the Covid pandemic and the Delta varient?  My current benefit that you sent in last year expires on Dec. 31st of 2021.  Thank you.  The address for [place] is [address], [place] [address] and the phone number there is [phone].  Fax number is [phone]. Thank you, because having this exception is the only way I can possibly afford this medicine.    Brendan [name] [phone] [email or web address]

## 2020-04-26 MED ORDER — RIVAROXABAN 20 MG PO TABS: 20.0000 mg | ORAL_TABLET | Freq: Every day | ORAL | 3 refills | Status: DC

## 2020-04-26 NOTE — Addendum Note (Signed)
Addended byDamita Dunnings D on: 04/26/2020 12:16 PM   Modules accepted: Orders

## 2020-04-26 NOTE — Telephone Encounter (Signed)
Tier exception denied. Brand name drug is covered at the cost-sharing (copay) tier listed in current drug guide. Medicare changed tier exemption rules in 2019- since there is not a brand name drug for Pt's condition is a lower cost-sharing tier the request has been denied. Please note that when a branded drug (such as Xarelto) is not available generically yet- [employer]/Medicare considers them as generic.

## 2020-04-26 NOTE — Telephone Encounter (Signed)
Advise patient of his insurance response.  Unfortunately there is no inexpensive alternative other than Coumadin but he will have to check frequently to the office so the cost is not as inexpensive as it may look. Recommend to continue Xarelto. We could provide samples episodically. If he needs further advice, recommend to gust with cardiology or with our clinical pharmacist

## 2020-04-26 NOTE — Telephone Encounter (Signed)
Mychart message sent to Pt.

## 2020-04-28 ENCOUNTER — Other Ambulatory Visit: Payer: Self-pay | Admitting: Internal Medicine

## 2020-04-28 DIAGNOSIS — E785 Hyperlipidemia, unspecified: Secondary | ICD-10-CM

## 2020-04-29 ENCOUNTER — Ambulatory Visit: Payer: Medicare HMO | Admitting: Pharmacist

## 2020-04-29 DIAGNOSIS — F341 Dysthymic disorder: Secondary | ICD-10-CM

## 2020-04-29 DIAGNOSIS — K219 Gastro-esophageal reflux disease without esophagitis: Secondary | ICD-10-CM

## 2020-04-29 DIAGNOSIS — F17200 Nicotine dependence, unspecified, uncomplicated: Secondary | ICD-10-CM

## 2020-04-29 NOTE — Patient Instructions (Addendum)
Visit Information  Goals Addressed            This Visit's Progress   . Chronic Care Management Pharmacy Care Plan       CARE PLAN ENTRY (see longitudinal plan of care for additional care plan information)  Current Barriers:  . Chronic Disease Management support, education, and care coordination needs related to Aflutter, Pre-Diabetes, COPD, Tobacco Use Disorder, Hypertension, Hyperlipidemia/CAD, Depression/Anxiety, Overactive Bladder, GERD, Chronic Pain   Hypertension BP Readings from Last 3 Encounters:  04/18/20 124/74  04/09/20 110/70  04/03/20 112/76   . Pharmacist Clinical Goal(s): o Over the next 90 days, patient will work with PharmD and providers to maintain BP goal <140/90 . Current regimen:  o Metoprolol XL 50 mg 1.5 tabs daily  . Patient self care activities - Over the next 90 days, patient will: o Maintain hypertension medication regimen.  Hyperlipidemia Lab Results  Component Value Date/Time   LDLCALC 70 01/22/2020 10:28 AM   LDLCALC 60 12/14/2018 11:20 AM   LDLDIRECT 109.0 07/12/2017 11:04 AM   . Pharmacist Clinical Goal(s): o Over the next 90 days, patient will work with PharmD and providers to maintain LDL goal < 70 . Current regimen:  . Atorvastatin 80 mg daily . Ezetimibe 10 mg daily  . Interventions: o Discussed LDL goal . Patient self care activities - Over the next 90 days, patient will: o Maintain cholesterol medication regimen. o   Diabetes Lab Results  Component Value Date/Time   HGBA1C 6.2 01/22/2020 10:28 AM   HGBA1C 6.2 09/21/2019 02:36 PM   . Pharmacist Clinical Goal(s): o Over the next 90 days, patient will work with PharmD and providers to maintain A1c goal <6.5% . Current regimen:  o Diet and exercise management   . Interventions: o Discussed how long term corticosteroid use could increase a1c . Patient self care activities - Over the next 90 days, patient will: o Maintain a1c less than 6.5%  GERD . Pharmacist Clinical  Goal(s) o Over the next 90 days, patient will work with PharmD and providers to reduce symptoms of GERD and reduce polypharmacy . Current regimen:  o Pantoprazole 40mg  twice daily . Interventions: o Consider completing DEXA Scan . Patient self care activities - Over the next 90 days, patient will: o Consider repeat DEXA  Depression/Anxiety . Pharmacist Clinical Goal(s) o Over the next 90 days, patient will work with PharmD and providers to reduce symptoms of depression/anxiety . Current regimen:  . Alprazolam 0.5 mg BID PRN  . Fluoxetine 20 mg daily AM . Interventions: o Continue tapering plan as tolerated . Patient self care activities - Over the next 90 days, patient will: o Maintain depression/anxiety medication regimen.    Tobacco Use Disorder . Pharmacist Clinical Goal(s) o Over the next 90 days, patient will work with PharmD and providers to stop smoking . Current regimen:  . Nicotine 21mg  patches (#14) . Nicotine 4mg  gum (#110) . Interventions: o Will help patient with accountability during smoking cessation process  o New quit date of 03/08/2020 . Patient self care activities - Over the next 90 days, patient will: o Stop smoking   Health Maintenance  . Pharmacist Clinical Goal(s) o Over the next 180 days, patient will work with PharmD and providers to complete health maintenance screenings/vaccinations . Interventions: o Recommend patient receive Td booster around 05/2020 o Discussion about long term steroid use and PPI on bone health  o Consider getting DEXA Scan  . Patient self care activities - Over the  next 180 days, patient will: o Get Td booster  Medication management . Pharmacist Clinical Goal(s): o Over the next 90 days, patient will work with PharmD and providers to maintain optimal medication adherence . Current pharmacy: [facility] . Interventions o Comprehensive medication review performed. o Continue current medication management  strategy . Patient self care activities - Over the next 90 days, patient will: o Focus on medication adherence by filling and taking medications appropriately  o Take medications as prescribed o Report any questions or concerns to PharmD and/or provider(s)  Please see past updates related to this goal by clicking on the "Past Updates" button in the selected goal         The patient verbalized understanding of instructions, educational materials, and care plan provided today and declined offer to receive copy of patient instructions, educational materials, and care plan.   Telephone follow up appointment with pharmacy team member scheduled for: 08/01/2020  De Blanch, PharmD Clinical Pharmacist [facility] at [facility] [phone]

## 2020-04-29 NOTE — Chronic Care Management (AMB) (Signed)
Chronic Care Management Pharmacy  Name: EULAN HEYWARD  MRN: [id number] DOB: 03/22/1951   Chief Complaint/ HPI  [name],  69 y.o. , male presents for their Follow-Up CCM visit with the clinical pharmacist via telephone due to COVID-19 Pandemic.  PCP : Colon Branch, MD  Their chronic conditions include: Aflutter, Pre-Diabetes, COPD, Tobacco Use Disorder, Hypertension, Hyperlipidemia/CAD, Depression/Anxiety, Overactive Bladder, GERD, Chronic Pain  Office Visits: 04/03/20: Visit w/ Dr. Larose Kells - ER follow up. Pt told to hold Xarelto and switch to protonix BID. Pt to see GI and hold xarelto until GI bleed resolved  Consult Visit: 04/09/20: Gertie Fey visit w/ Dr. Fuller Plan - Continue pantoprazole 20mg  BID. No aspirin or nsaids. Metoprolol decreased to 50mg  daily? EGD Scheduled.    Allergies  Allergen Reactions  . Tussionex Pennkinetic Er [Hydrocod Polst-Cpm Polst Er] Hives    No current issues, only had issues when he was 69 years old  . Albuterol Palpitations  . Qvar [Beclomethasone] Palpitations    Medications: Outpatient Encounter Medications as of 04/29/2020  Medication Sig  . acetaminophen (TYLENOL) 500 MG tablet Take 500 mg by mouth every 6 (six) hours as needed.  . ALPRAZolam (XANAX) 0.5 MG tablet Take 1 tablet (0.5 mg total) by mouth 2 (two) times daily as needed for anxiety.  Marland Kitchen atorvastatin (LIPITOR) 80 MG tablet Take 1 tablet (80 mg total) by mouth daily.  . carisoprodol (SOMA) 350 MG tablet TAKE ONE TABLET BY MOUTH THREE TIMES A Nilton Lave AS NEEDED FOR MUSCLE SPASMS  . chlorpheniramine (EQ CHLORTABS) 4 MG tablet Take 8 mg by mouth daily as needed for allergies or rhinitis.   . Cholecalciferol (VITAMIN D3 PO) Take 2,000-5,000 Units by mouth daily.   . diclofenac sodium (VOLTAREN) 1 % GEL Apply 4 g topically 4 (four) times daily as needed.  . ezetimibe (ZETIA) 10 MG tablet Take 1 tablet (10 mg total) by mouth daily.  Marland Kitchen FLUoxetine (PROZAC) 20 MG capsule Take 1 capsule (20 mg  total) by mouth daily.  Marland Kitchen HYDROcodone-acetaminophen (NORCO/VICODIN) 5-325 MG tablet TAKE 1 TABLET BY MOUTH AT BEDTIME  . metoprolol succinate (TOPROL-XL) 50 MG 24 hr tablet Take 50 mg by mouth daily.  . Multiple Vitamin (MULTIVITAMIN) tablet Take 1 tablet by mouth daily. Calcium, Magnesium, Zinc  . oxybutynin (DITROPAN XL) 15 MG 24 hr tablet Take 15 mg by mouth at bedtime. Increased from 10mg  by Urologist  . pantoprazole (PROTONIX) 40 MG tablet Take 1 tablet (40 mg total) by mouth 2 (two) times daily.  . rivaroxaban (XARELTO) 20 MG TABS tablet Take 1 tablet (20 mg total) by mouth daily with supper.  . sildenafil (VIAGRA) 100 MG tablet Take 100 mg by mouth daily as needed for erectile dysfunction.   No facility-administered encounter medications on file as of 04/29/2020.   SDOH Screenings   Alcohol Screen:   . Last Alcohol Screening Score (AUDIT): Not on file  Depression (PHQ2-9): Low Risk   . PHQ-2 Score: 4  Financial Resource Strain: Medium Risk  . Difficulty of Paying Living Expenses: Somewhat hard  Food Insecurity: No Food Insecurity  . Worried About Charity fundraiser in the Last Year: Never true  . Ran Out of Food in the Last Year: Never true  Housing: Low Risk   . Last Housing Risk Score: 0  Physical Activity:   . Days of Exercise per Week: Not on file  . Minutes of Exercise per Session: Not on file  Social Connections:   . Frequency  of Communication with Friends and Family: Not on file  . Frequency of Social Gatherings with Friends and Family: Not on file  . Attends Religious Services: Not on file  . Active Member of Clubs or Organizations: Not on file  . Attends Archivist Meetings: Not on file  . Marital Status: Not on file  Stress:   . Feeling of Stress : Not on file  Tobacco Use: High Risk  . Smoking Tobacco Use: Current Every Latiqua Daloia Smoker  . Smokeless Tobacco Use: Never Used  Transportation Needs: No Transportation Needs  . Lack of Transportation  (Medical): No  . Lack of Transportation (Non-Medical): No     Current Diagnosis/Assessment:  Social Hx:  Has been a patient since 54 or 70. Lives alone so feels like he can do what he wants. [name] years then moved to this area for work. Completely retired. 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  Wife had a genetic disorder. She died 2 years ago. They were married 29 years.  No children together. Wife had 3 children. He has no children of his own.  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 He enjoys working with children.  Mom was born in [place], but he never visited the state. Born in [place], [place].  Update 02/28/20 Has a lady that he is considering to allow to move in to help him with recovery post shoulder surgery.  Update 04/29/20 Patient asks about the keto diet Discussed risk to increasing cholesterol. Feels everything was changing with his medications, so he wanted to get some routine back and didn't make any of the other changes discussed  Goals Addressed            This Visit's Progress   . Chronic Care Management Pharmacy Care Plan       CARE PLAN ENTRY (see longitudinal plan of care for additional care plan information)  Current Barriers:  . Chronic Disease Management support, education, and care coordination needs related to Aflutter, Pre-Diabetes, COPD, Tobacco Use Disorder, Hypertension, Hyperlipidemia/CAD, Depression/Anxiety, Overactive Bladder, GERD, Chronic Pain   Hypertension BP Readings from Last 3 Encounters:  04/18/20 124/74  04/09/20 110/70  04/03/20 112/76   . Pharmacist Clinical Goal(s): o Over the next 90 days, patient will work with PharmD and providers to maintain BP goal <140/90 . Current regimen:  o Metoprolol XL 50 mg 1.5 tabs daily  . Patient self care activities - Over the next 90 days, patient will: o Maintain hypertension medication regimen.  Hyperlipidemia Lab Results  Component  Value Date/Time   LDLCALC 70 01/22/2020 10:28 AM   LDLCALC 60 12/14/2018 11:20 AM   LDLDIRECT 109.0 07/12/2017 11:04 AM   . Pharmacist Clinical Goal(s): o Over the next 90 days, patient will work with PharmD and providers to maintain LDL goal < 70 . Current regimen:  . Atorvastatin 80 mg daily . Ezetimibe 10 mg daily  . Interventions: o Discussed LDL goal . Patient self care activities - Over the next 90 days, patient will: o Maintain cholesterol medication regimen. o   Diabetes Lab Results  Component Value Date/Time   HGBA1C 6.2 01/22/2020 10:28 AM   HGBA1C 6.2 09/21/2019 02:36 PM   . Pharmacist Clinical Goal(s): o Over the next 90 days, patient will work with PharmD and providers to maintain A1c goal <6.5% . Current regimen:  o Diet and exercise management   . Interventions: o Discussed how long term corticosteroid use could increase a1c . Patient  self care activities - Over the next 90 days, patient will: o Maintain a1c less than 6.5%  GERD . Pharmacist Clinical Goal(s) o Over the next 90 days, patient will work with PharmD and providers to reduce symptoms of GERD and reduce polypharmacy . Current regimen:  o Pantoprazole 40mg  twice daily . Interventions: o Consider completing DEXA Scan . Patient self care activities - Over the next 90 days, patient will: o Consider repeat DEXA  Depression/Anxiety . Pharmacist Clinical Goal(s) o Over the next 90 days, patient will work with PharmD and providers to reduce symptoms of depression/anxiety . Current regimen:  . Alprazolam 0.5 mg BID PRN  . Fluoxetine 20 mg daily AM . Interventions: o Continue tapering plan as tolerated . Patient self care activities - Over the next 90 days, patient will: o Maintain depression/anxiety medication regimen.    Tobacco Use Disorder . Pharmacist Clinical Goal(s) o Over the next 90 days, patient will work with PharmD and providers to stop smoking . Current regimen:  . Nicotine 21mg   patches (#14) . Nicotine 4mg  gum (#110) . Interventions: o Will help patient with accountability during smoking cessation process  o New quit date of 03/08/2020 . Patient self care activities - Over the next 90 days, patient will: o Stop smoking   Health Maintenance  . Pharmacist Clinical Goal(s) o Over the next 180 days, patient will work with PharmD and providers to complete health maintenance screenings/vaccinations . Interventions: o Recommend patient receive Td booster around 05/2020 o Discussion about long term steroid use and PPI on bone health  o Consider getting DEXA Scan  . Patient self care activities - Over the next 180 days, patient will: o Get Td booster  Medication management . Pharmacist Clinical Goal(s): o Over the next 90 days, patient will work with PharmD and providers to maintain optimal medication adherence . Current pharmacy: [facility] . Interventions o Comprehensive medication review performed. o Continue current medication management strategy . Patient self care activities - Over the next 90 days, patient will: o Focus on medication adherence by filling and taking medications appropriately  o Take medications as prescribed o Report any questions or concerns to PharmD and/or provider(s)  Please see past updates related to this goal by clicking on the "Past Updates" button in the selected goal         Tobacco Abuse   Tobacco Status:  Social History   Tobacco Use  Smoking Status Current Every Takaya Hyslop Smoker  . Packs/Anthonette Lesage: 0.50  . Years: 51.00  . Pack years: 25.50  . Types: Cigarettes  . Start date: 1970  Smokeless Tobacco Never Used  Tobacco Comment   Still smoking max of #15/Jotham Ahn    Patient smokes After 30 minutes of waking (within in hour) Patient triggers include: driving and drinking coffee On a scale of 1-10, reports MOTIVATION to quit is 6-7 (feels he needs to more than he wants to) On a scale of 1-10, reports CONFIDENCE in quitting  is 8-9 (feeling he needs to score of 10)  Previous quit attempts included: None noted  Patient is currently uncontrolled on the following medications:  . Nicotine 21mg  patches (#14) . Nicotine 4mg  gum (#110)  States he enjoyed his birthday, but did not stop smoking due to people gifting him cigarettes. This is his first real attempt at quitting. Has only used one patch. He is not a huge fan of gum at current moment, but has it available for cravings. Feels it is difficult to  use the "park" method for the gum. Is connected with [place] [facility].  Cleaned out ash trays and lighters.  He is considering October 1st as his new quit date.   We discussed:  Patient set quit date of 03/08/2020. Counseled on patch placement, side effects, and option to remove at night if they experience trouble sleeping or bad dreams.  Counseled on park & chew method for NRT gum.  Update 04/29/20 Stopped smoking on 03/08/20, but restarted on 03/31/20 when his stomach issues occurred.  Smoking about 1/2 PPD. Doesn't have real desire to quit currently.   Plan -Consider next realistic quit date   Depression / Anxiety   PHQ9 Score:  PHQ9 SCORE ONLY 04/29/2020 04/03/2020 12/20/2019  PHQ-9 Total Score 4 14 3    GAD7 Score: No flowsheet data found.  Patient has failed these meds in past: None noted  Patient is currently controlled on the following medications:  . Alprazolam 0.5 mg BID PRN  . Fluoxetine 20 mg daily AM  Patient received this when he lost his wife. He's not sure if this helps him or not. Alprazolam: Was using three times daily and patient wanted to move to twice daily. Originally prescribed by Dr. Michelle Nasuti due to concern for anxiety attack when patient states he was actually having Aflutter. He did notice benefit of them. He is interested in going back to three daily potentially. Feels "antsy".  Interested in D/C fluoxetine pending clinical team decision.  Will perform PHQ9 at next visit and consult with  Dr. Larose Kells about potential tapering plan.   Update 12/20/19 PHQ9: 3 Will wait to consider tapering plan until further analysis with upcoming visits.   Update 01/29/20 We discussed tapering option of fluoxetine and his smoking cessation plan.  He is fearful of gaining weight with smoking cessation. He is using NCquits with free nicotine patches and gums.  Discussed that he should probably wait to taper fluoxetine until after he has stopped smoking for 3-4 months. Patient is agreeable to this plan.   Update 02/28/20 Has started taking fluoxetine every other Shmuel Girgis and feels fine.  Denies feeling any increase in anxiety or depression  Update 04/29/20 Restarted taking daily with distress with stomach issues  Plan -Continue current medications   GERD   Patient has failed these meds in past: None noted  Patient is currently controlled on the following medications:  . Pantoprazole 40mg  twice daily (changed from omeprazole after episode with melana)  Problem Story Patient didn't feel he had specific GERD symptoms Feels he received omeprazole from Dr. Michelle Nasuti due to taking a lot of medications and not wanting to get upset stomach. Pt was interested in tapering off of PPI, but did not tolerate taper. Recommended DEXA Scan, but pt concern with cost  Update 02/28/20 Still taking omeprazole daily  Update 04/29/20 Had episode of melana, egd, and biopsy. Still waiting for biopsy results.  States he is currently taking #2 20mg  capsules of pantoprazole once daily. Has not started updated prescription of pantoprazole.  Informed patient of updated prescription and he plans to start tomorrow.  Plan -Continue current medications     Pain   Patient has failed these meds in past: Etodolac (concern with anticoag) Patient is currently uncontrolled on the following medications:  Carisoprodol 350 mg TID PRN  (uses once daily at night)  Voltaren 1% gel QID PRN   Hydrocodone-APAP 5-235 mg QHS    Tylenol 500mg  #1 twice daily AM, Lunch  Problem Story Pt has knee, wrist, and shoulder  pain. This would prevent him from sleeping. Acute osteoarthritis in both shoulders. Was told he needs to have both shoulders replaced, but was told it would take 13 weeks to recover and he wouldn't like to do that.  He is receiving corticosteroid injections. These work great. He has no pain for 4-5 months.   Update 01/29/20 Feels steroid injections are not working as well as they did in the past. He is interested in Financial risk analyst with [employer] [facility]. We discussed possible benefit, but that patient should have specific questions answered at his free consultation. (covered by insurance, length of therapy, what specific therapies used, etc).   Update 02/28/20 Went to [facility]. Was told it would cost $8,000-11,000 with no guaranteed benefits. Patient is now considering shoulder procedure. R>L. He is hesitant about procedure. He doesn't want to go to rehab center and be limited. He is hoping procedure can be done outpatient and that he can rehab at home.  Update 04/29/20 Would like try a keto diet to help with inflammation to see if this can prevent him from having surgery.  Plan -Continue current medications   Vaccines   Reviewed and discussed patient's vaccination history.    Immunization History  Administered Date(s) Administered  . Fluad Quad(high Dose 65+) 02/10/2019  . Influenza Split 06/05/2012, 06/20/2013, 04/10/2014  . Influenza Whole 04/08/2009, 04/22/2010, 02/11/2011  . Influenza, High Dose Seasonal PF 03/02/2016  . Influenza-Unspecified 03/28/2015, 03/17/2017, 03/10/2018, 03/14/2020  . PFIZER SARS-COV-2 Vaccination 06/29/2019, 07/19/2019, 03/14/2020  . Pneumococcal Conjugate-13 04/27/2014  . Pneumococcal Polysaccharide-23 05/19/2010, 01/22/2020  . Td 05/19/2010  . Zoster 12/25/2013  . Zoster Recombinat (Shingrix) 04/06/2019, 06/18/2019    Plan -Recommended patient receive Td vaccine  in pharmacy/office around 05/2020.   De Blanch, PharmD Clinical Pharmacist [facility] at [facility] [phone]

## 2020-04-30 ENCOUNTER — Encounter: Payer: Self-pay | Admitting: Gastroenterology

## 2020-05-08 ENCOUNTER — Encounter: Payer: Self-pay | Admitting: Internal Medicine

## 2020-05-14 ENCOUNTER — Other Ambulatory Visit: Payer: Self-pay | Admitting: *Deleted

## 2020-05-14 DIAGNOSIS — I2510 Atherosclerotic heart disease of native coronary artery without angina pectoris: Secondary | ICD-10-CM

## 2020-05-14 NOTE — Progress Notes (Signed)
car

## 2020-05-20 ENCOUNTER — Telehealth: Payer: Self-pay | Admitting: Gastroenterology

## 2020-05-20 ENCOUNTER — Other Ambulatory Visit: Payer: Self-pay | Admitting: *Deleted

## 2020-05-20 DIAGNOSIS — I2510 Atherosclerotic heart disease of native coronary artery without angina pectoris: Secondary | ICD-10-CM

## 2020-05-20 NOTE — Telephone Encounter (Signed)
Pt is requesting a call back from a nurse to discuss some of his symptoms. Pt states he has been experiencing what he describes as "gas" in his upper digestive system since 5 days ago.

## 2020-05-20 NOTE — Telephone Encounter (Signed)
Patient reports belching with each meals.  He is advised that he can try gas x or phazyme ac meals. He has been on a Keto diet and is eating a large amount of brussell sprouts and other fresh vegetables that are high in gas.  He is advised that his new gas and belching may be partially related to his new diet.  He is advised to try the gas x and if this fails to improve the symptoms he may need to try dietary changes.

## 2020-05-21 ENCOUNTER — Telehealth (HOSPITAL_COMMUNITY): Payer: Self-pay | Admitting: *Deleted

## 2020-05-21 NOTE — Telephone Encounter (Signed)
Left message on voicemail per DPR in reference to upcoming appointment scheduled on 05/24/20 at 10:30 with detailed instructions given per Myocardial Perfusion Study Information Sheet for the test. LM to arrive 15 minutes early, and that it is imperative to arrive on time for appointment to keep from having the test rescheduled. If you need to cancel or reschedule your appointment, please call the office within 24 hours of your appointment. Failure to do so may result in a cancellation of your appointment, and a $50 no show fee. Phone number given for call back for any questions.

## 2020-05-22 ENCOUNTER — Telehealth: Payer: Self-pay | Admitting: Pharmacist

## 2020-05-22 ENCOUNTER — Other Ambulatory Visit: Payer: Self-pay | Admitting: *Deleted

## 2020-05-22 DIAGNOSIS — R079 Chest pain, unspecified: Secondary | ICD-10-CM

## 2020-05-22 NOTE — Progress Notes (Addendum)
Chronic Care Management Pharmacy Assistant   Name: KDYN VONBEHREN  MRN: [id number] DOB: 09-13-1950  Reason for Encounter: Disease State  Patient Questions:  1.  Have you seen any other providers since your last visit? No  2.  Any changes in your medicines or health? No    PCP : Colon Branch, MD   Their chronic conditions include: Aflutter, Pre-Diabetes, COPD, Tobacco Use Disorder, Hypertension, Hyperlipidemia/CAD, Depression/Anxiety, Overactive Bladder, GERD, Chronic Pain  Office Visits: None since their last CCM visit with the clinical pharmacist on 04-29-2020.  Consults: None since their last CCM visit with the clinical pharmacist on 04-29-2020.  Allergies:   Allergies  Allergen Reactions   Tussionex Pennkinetic Er [Hydrocod Polst-Cpm Polst Er] Hives    No current issues, only had issues when he was 69 years old   Albuterol Palpitations   Qvar [Beclomethasone] Palpitations    Medications: Outpatient Encounter Medications as of 05/22/2020  Medication Sig   acetaminophen (TYLENOL) 500 MG tablet Take 500 mg by mouth every 6 (six) hours as needed.   ALPRAZolam (XANAX) 0.5 MG tablet Take 1 tablet (0.5 mg total) by mouth 2 (two) times daily as needed for anxiety.   atorvastatin (LIPITOR) 80 MG tablet Take 1 tablet (80 mg total) by mouth daily.   carisoprodol (SOMA) 350 MG tablet TAKE ONE TABLET BY MOUTH THREE TIMES A DAY AS NEEDED FOR MUSCLE SPASMS   chlorpheniramine (EQ CHLORTABS) 4 MG tablet Take 8 mg by mouth daily as needed for allergies or rhinitis.    Cholecalciferol (VITAMIN D3 PO) Take 2,000-5,000 Units by mouth daily.    diclofenac sodium (VOLTAREN) 1 % GEL Apply 4 g topically 4 (four) times daily as needed.   ezetimibe (ZETIA) 10 MG tablet Take 1 tablet (10 mg total) by mouth daily.   FLUoxetine (PROZAC) 20 MG capsule Take 1 capsule (20 mg total) by mouth daily.   HYDROcodone-acetaminophen (NORCO/VICODIN) 5-325 MG tablet TAKE 1 TABLET BY MOUTH AT BEDTIME    metoprolol succinate (TOPROL-XL) 50 MG 24 hr tablet Take 50 mg by mouth daily.   Multiple Vitamin (MULTIVITAMIN) tablet Take 1 tablet by mouth daily. Calcium, Magnesium, Zinc   oxybutynin (DITROPAN XL) 15 MG 24 hr tablet Take 15 mg by mouth at bedtime. Increased from 10mg  by Urologist   pantoprazole (PROTONIX) 40 MG tablet Take 1 tablet (40 mg total) by mouth 2 (two) times daily.   rivaroxaban (XARELTO) 20 MG TABS tablet Take 1 tablet (20 mg total) by mouth daily with supper.   sildenafil (VIAGRA) 100 MG tablet Take 100 mg by mouth daily as needed for erectile dysfunction.   No facility-administered encounter medications on file as of 05/22/2020.    Current Diagnosis: Patient Active Problem List   Diagnosis Date Noted   Nonintractable episodic headache 12/14/2019   Controlled diabetes mellitus type 2 with complications ([place]) 70/35/0093   COPD mixed type ([place]) 04/18/2018   OSA (obstructive sleep apnea) 10/16/2015   Bruit 04/24/2015   PCP NOTES >>>>>> 02/25/2015   Hx of colonic polyps 06/25/2014   Sinus bradycardia 06/19/2014   Annual physical exam >>>>>>>>>>>>>>>>>> 04/28/2014   Ejection fraction    GERD (gastroesophageal reflux disease) 02/11/2011   Paroxysmal atrial flutter (HCC)    HTN (hypertension)    CAD (coronary artery disease)    Dyslipidemia    Obesity    B12 deficiency    VITAMIN D DEFICIENCY 05/20/2009   Polyneuropathy 09/10/2008   BACK PAIN, LUMBAR--UDS 08/03/2008   ERECTILE DYSFUNCTION  02/09/2008   BPH (benign prostatic hyperplasia) 02/09/2008   COLONIC POLYPS 07/23/2007   CIGARETTE SMOKER 07/23/2007   Solitary pulmonary nodule-chest x-ray stable 08-2014, no further schedule imaging 07/23/2007   DIVERTICULOSIS OF COLON 07/23/2007   ANXIETY DEPRESSION 07/08/2007    Goals Addressed   None    Made outbound call to the patient for a General Adherence touch point. Messages in my chart reflect the patient has started a new Keto Diet regimen in hopes of helping him  with medical issues. Patient returned my call. States that he tried to quit smoking but had some medical issues that does not allow him to be in the head space to quit smoking. He has tools in order to quit (patches), but is not ready to try and stop. His shoulder surgery is not scheduled yet. Advised that he confirm if he had to stop smoking as a requirement prior to surgery, and research healing process with being a smoker. States he has stopped the Keto diet. He has experienced chest pain with trapped gas.   Follow-Up:  Pharmacist Review   Fanny Skates, [place] Pharmacist Assistant [phone]  3 minutes spent in review, coordination, and documentation.   Reviewed by: De Blanch, PharmD, BCACP Clinical Pharmacist [facility] [phone]

## 2020-05-23 ENCOUNTER — Encounter: Payer: Self-pay | Admitting: Internal Medicine

## 2020-05-23 ENCOUNTER — Telehealth: Payer: Self-pay | Admitting: Internal Medicine

## 2020-05-23 ENCOUNTER — Telehealth: Payer: Self-pay

## 2020-05-23 DIAGNOSIS — E785 Hyperlipidemia, unspecified: Secondary | ICD-10-CM

## 2020-05-23 DIAGNOSIS — E118 Type 2 diabetes mellitus with unspecified complications: Secondary | ICD-10-CM

## 2020-05-23 DIAGNOSIS — E669 Obesity, unspecified: Secondary | ICD-10-CM

## 2020-05-23 NOTE — Telephone Encounter (Signed)
PA approved.   PA Case: [id number], Status: Approved, Coverage Starts on: 06/09/2019 12:00:00 AM, Coverage Ends on: 06/07/2021 12:00:00 AM. Questions? Contact [phone].

## 2020-05-23 NOTE — Telephone Encounter (Signed)
PA initiated via Covermymeds; KEY: [id number]. Awaiting determination.

## 2020-05-23 NOTE — Telephone Encounter (Signed)
[name] with [facility] called in reference to referral to weight mgt. Patient is pre diabetic with AC1 at 6.2 and medicare will not cover nutrition unless AC1 is equal or greater than 6.5.

## 2020-05-23 NOTE — Telephone Encounter (Signed)
Spoke w/ [name]- informed A1c was 6.7 in 08/2018. She will call him back to set up visit.

## 2020-05-24 ENCOUNTER — Ambulatory Visit (HOSPITAL_COMMUNITY): Payer: Medicare HMO | Attending: Cardiovascular Disease

## 2020-05-24 ENCOUNTER — Other Ambulatory Visit: Payer: Self-pay

## 2020-05-24 DIAGNOSIS — I2510 Atherosclerotic heart disease of native coronary artery without angina pectoris: Secondary | ICD-10-CM | POA: Insufficient documentation

## 2020-05-24 DIAGNOSIS — Z01810 Encounter for preprocedural cardiovascular examination: Secondary | ICD-10-CM

## 2020-05-24 DIAGNOSIS — Z01818 Encounter for other preprocedural examination: Secondary | ICD-10-CM

## 2020-05-24 LAB — MYOCARDIAL PERFUSION IMAGING
LV dias vol: 122 mL (ref 62–150)
LV sys vol: 46 mL
Peak HR: 77 {beats}/min
Rest HR: 50 {beats}/min
SDS: 5
SRS: 0
SSS: 5
TID: 1.13

## 2020-05-24 MED ORDER — TECHNETIUM TC 99M TETROFOSMIN IV KIT
10.4000 | PACK | Freq: Once | INTRAVENOUS | Status: AC | PRN
  Administered 2020-05-24: 10.4 via INTRAVENOUS
  Filled 2020-05-24: qty 11

## 2020-05-24 MED ORDER — TECHNETIUM TC 99M TETROFOSMIN IV KIT
31.4000 | PACK | Freq: Once | INTRAVENOUS | Status: AC | PRN
  Administered 2020-05-24: 31.4 via INTRAVENOUS
  Filled 2020-05-24: qty 32

## 2020-05-24 MED ORDER — REGADENOSON 0.4 MG/5ML IV SOLN
0.4000 mg | Freq: Once | INTRAVENOUS | Status: AC
  Administered 2020-05-24: 0.4 mg via INTRAVENOUS

## 2020-06-04 ENCOUNTER — Telehealth: Payer: Self-pay

## 2020-06-04 NOTE — Telephone Encounter (Signed)
Patient with diagnosis of afib on Xarelto for anticoagulation.    Procedure: RIGHT ROTATOR CUFF ARTHROPLASTY  Date of procedure: TBD    CHA2DS2-VASc Score = 5  This indicates a 7.2% annual risk of stroke. The patient's score is based upon: CHF History: No HTN History: Yes Diabetes History: No Stroke History: Yes Vascular Disease History: Yes Age Score: 1 Gender Score: 0      CrCl 144 ml/min  Patient was cleared by Dr. Jens Som to hold Xarelto back in October (see office visit note from 03/27/20).  Per Dr. Jens Som, patient may hold Xarelto 3 days prior to procedure.

## 2020-06-04 NOTE — Telephone Encounter (Signed)
   Primary Cardiologist: Dr. Jens Som  Chart reviewed as part of pre-operative protocol coverage. Given past medical history and time since last visit, based on ACC/AHA guidelines, Brendan [name] would be at acceptable risk for the planned procedure without further cardiovascular testing. He had Lexiscan Myoview 05/24/20 which was low risk with no evidence of ischemia.   Chart reviewed by PharmD, per Dr. Jens Som office visit 03/27/20 may hold Xarelto 3 days prior to planned procedure.   The patient was advised that if he develops new symptoms prior to surgery to contact our office to arrange for a follow-up visit, and he verbalized understanding.  I will route this recommendation to the requesting party via Epic fax function and remove from pre-op pool.  Please call with questions.  Alver Sorrow, NP 06/04/2020, 12:38 PM

## 2020-06-04 NOTE — Telephone Encounter (Signed)
° °  [facility] Pre-operative Risk Assessment    Request for surgical clearance:  1. What type of surgery is being performed? RIGHT ROTATOR CUFF ARTHROPLASTY   2. When is this surgery scheduled? TBD   3. What type of clearance is required (medical clearance vs. Pharmacy clearance to hold med vs. Both)? BOTH  4. Are there any medications that need to be held prior to surgery and how long? [place]   5. Practice name and name of physician performing surgery? [facility]   6. What is the office phone number? [phone]   7.   What is the office fax number? [phone]  8.   Anesthesia type (None, local, MAC, general) ? GENERAL & 1 SHOT BLOCK

## 2020-06-05 ENCOUNTER — Ambulatory Visit: Payer: Medicare HMO | Admitting: Dietician

## 2020-06-11 ENCOUNTER — Telehealth: Payer: Self-pay | Admitting: Internal Medicine

## 2020-06-11 MED ORDER — HYDROCODONE-ACETAMINOPHEN 5-325 MG PO TABS: 1.0000 | ORAL_TABLET | Freq: Every day | ORAL | 0 refills | Status: DC

## 2020-06-11 NOTE — Telephone Encounter (Signed)
Requesting: hydrocodone 5-325mg   Contract: 08/17/2018 UDS: 06/26/2019  Last Visit: 04/03/2020 Next Visit: 07/24/2020 Last Refill: 04/15/2020 #90 and 0RF Pt sig: 1 tab qhs   Please Advise

## 2020-06-11 NOTE — Telephone Encounter (Signed)
PDMP okay, he is dispensed 30 tablets at a time, 2 prescription for 30 tablets each sent.

## 2020-06-13 DIAGNOSIS — M75101 Unspecified rotator cuff tear or rupture of right shoulder, not specified as traumatic: Secondary | ICD-10-CM | POA: Diagnosis not present

## 2020-06-13 DIAGNOSIS — Z01810 Encounter for preprocedural cardiovascular examination: Secondary | ICD-10-CM | POA: Diagnosis not present

## 2020-06-13 DIAGNOSIS — M19011 Primary osteoarthritis, right shoulder: Secondary | ICD-10-CM | POA: Diagnosis not present

## 2020-06-13 DIAGNOSIS — Z01818 Encounter for other preprocedural examination: Secondary | ICD-10-CM | POA: Diagnosis not present

## 2020-06-13 DIAGNOSIS — Z01812 Encounter for preprocedural laboratory examination: Secondary | ICD-10-CM | POA: Diagnosis not present

## 2020-06-13 LAB — BASIC METABOLIC PANEL
BUN: 14 (ref 4–21)
CO2: 26 — AB (ref 13–22)
Chloride: 102 (ref 99–108)
Creatinine: 0.9 (ref 0.6–1.3)
Glucose: 124
Potassium: 4.4 (ref 3.4–5.3)
Sodium: 137 (ref 137–147)

## 2020-06-13 LAB — CBC: RBC: 5.18 — AB (ref 3.87–5.11)

## 2020-06-13 LAB — COMPREHENSIVE METABOLIC PANEL
Calcium: 9.9 (ref 8.7–10.7)
GFR calc Af Amer: 100
GFR calc non Af Amer: 87

## 2020-06-13 LAB — CBC AND DIFFERENTIAL
HCT: 46 (ref 41–53)
Hemoglobin: 14.7 (ref 13.5–17.5)
Platelets: 225 (ref 150–399)
WBC: 6.7

## 2020-06-13 LAB — HEPATIC FUNCTION PANEL
ALT: 18 (ref 10–40)
AST: 19 (ref 14–40)
Alkaline Phosphatase: 89 (ref 25–125)
Bilirubin, Total: 0.4

## 2020-06-18 ENCOUNTER — Encounter: Payer: Self-pay | Admitting: Internal Medicine

## 2020-06-19 DIAGNOSIS — N5201 Erectile dysfunction due to arterial insufficiency: Secondary | ICD-10-CM | POA: Diagnosis not present

## 2020-06-19 DIAGNOSIS — N3281 Overactive bladder: Secondary | ICD-10-CM | POA: Diagnosis not present

## 2020-06-20 ENCOUNTER — Telehealth: Payer: Self-pay

## 2020-06-20 DIAGNOSIS — F341 Dysthymic disorder: Secondary | ICD-10-CM

## 2020-06-20 MED ORDER — ALPRAZOLAM 0.5 MG PO TABS: 0.5000 mg | ORAL_TABLET | Freq: Two times a day (BID) | ORAL | 0 refills | Status: DC | PRN

## 2020-06-20 NOTE — Telephone Encounter (Signed)
Requesting: alprazolam 0.5mg  Contract: 08/17/2018 UDS: 06/26/2019  Last Visit: 04/03/2020 Next Visit: 07/24/2020 Last Refill: 03/07/2020 #180 and 0RF Pt sig: 1 tab bid prn  Please Advise

## 2020-06-20 NOTE — Addendum Note (Signed)
Addended byDamita Dunnings D on: 06/20/2020 02:09 PM   Modules accepted: Orders

## 2020-06-20 NOTE — Telephone Encounter (Signed)
I re-send the, please verify it went through

## 2020-06-20 NOTE — Telephone Encounter (Signed)
Can you resend please? Transmission failed to pharmacy.

## 2020-06-20 NOTE — Telephone Encounter (Signed)
PDMP okay, prescription sent 

## 2020-06-20 NOTE — Addendum Note (Signed)
Addended by: [name] E on: 06/20/2020 03:28 PM   Modules accepted: Orders

## 2020-06-20 NOTE — Telephone Encounter (Signed)
ALPRAZolam (XANAX) 0.5 MG tablet 180 tablet 0 06/20/2020    Sig - Route: Take 1 tablet (0.5 mg total) by mouth 2 (two) times daily as needed for anxiety. - Oral   Sent to pharmacy as: ALPRAZolam Duanne Moron) 0.5 MG tablet   E-Prescribing Status: Receipt confirmed by pharmacy (06/20/2020  3:28 PM EST)

## 2020-07-03 ENCOUNTER — Encounter: Payer: Self-pay | Admitting: Internal Medicine

## 2020-07-03 ENCOUNTER — Other Ambulatory Visit: Payer: Self-pay

## 2020-07-08 DIAGNOSIS — Z20822 Contact with and (suspected) exposure to covid-19: Secondary | ICD-10-CM | POA: Diagnosis not present

## 2020-07-08 DIAGNOSIS — Z01812 Encounter for preprocedural laboratory examination: Secondary | ICD-10-CM | POA: Diagnosis not present

## 2020-07-11 DIAGNOSIS — Z888 Allergy status to other drugs, medicaments and biological substances status: Secondary | ICD-10-CM | POA: Diagnosis not present

## 2020-07-11 DIAGNOSIS — I4891 Unspecified atrial fibrillation: Secondary | ICD-10-CM | POA: Diagnosis not present

## 2020-07-11 DIAGNOSIS — Z885 Allergy status to narcotic agent status: Secondary | ICD-10-CM | POA: Diagnosis not present

## 2020-07-11 DIAGNOSIS — K279 Peptic ulcer, site unspecified, unspecified as acute or chronic, without hemorrhage or perforation: Secondary | ICD-10-CM | POA: Diagnosis not present

## 2020-07-11 DIAGNOSIS — I10 Essential (primary) hypertension: Secondary | ICD-10-CM | POA: Diagnosis not present

## 2020-07-11 DIAGNOSIS — J449 Chronic obstructive pulmonary disease, unspecified: Secondary | ICD-10-CM | POA: Diagnosis not present

## 2020-07-11 DIAGNOSIS — E1140 Type 2 diabetes mellitus with diabetic neuropathy, unspecified: Secondary | ICD-10-CM | POA: Diagnosis not present

## 2020-07-11 DIAGNOSIS — Z7901 Long term (current) use of anticoagulants: Secondary | ICD-10-CM | POA: Diagnosis not present

## 2020-07-11 DIAGNOSIS — I2510 Atherosclerotic heart disease of native coronary artery without angina pectoris: Secondary | ICD-10-CM | POA: Diagnosis not present

## 2020-07-11 DIAGNOSIS — G8918 Other acute postprocedural pain: Secondary | ICD-10-CM | POA: Diagnosis not present

## 2020-07-11 DIAGNOSIS — E669 Obesity, unspecified: Secondary | ICD-10-CM | POA: Diagnosis not present

## 2020-07-11 DIAGNOSIS — I4892 Unspecified atrial flutter: Secondary | ICD-10-CM | POA: Diagnosis not present

## 2020-07-11 DIAGNOSIS — Z955 Presence of coronary angioplasty implant and graft: Secondary | ICD-10-CM | POA: Diagnosis not present

## 2020-07-11 DIAGNOSIS — E785 Hyperlipidemia, unspecified: Secondary | ICD-10-CM | POA: Diagnosis not present

## 2020-07-11 DIAGNOSIS — M19011 Primary osteoarthritis, right shoulder: Secondary | ICD-10-CM | POA: Diagnosis not present

## 2020-07-11 DIAGNOSIS — K219 Gastro-esophageal reflux disease without esophagitis: Secondary | ICD-10-CM | POA: Diagnosis not present

## 2020-07-11 DIAGNOSIS — Z96611 Presence of right artificial shoulder joint: Secondary | ICD-10-CM | POA: Diagnosis not present

## 2020-07-11 HISTORY — PX: REVERSE TOTAL SHOULDER ARTHROPLASTY: SHX2344

## 2020-07-12 DIAGNOSIS — Z888 Allergy status to other drugs, medicaments and biological substances status: Secondary | ICD-10-CM | POA: Diagnosis not present

## 2020-07-12 DIAGNOSIS — I2510 Atherosclerotic heart disease of native coronary artery without angina pectoris: Secondary | ICD-10-CM | POA: Diagnosis not present

## 2020-07-12 DIAGNOSIS — M19011 Primary osteoarthritis, right shoulder: Secondary | ICD-10-CM | POA: Diagnosis not present

## 2020-07-12 DIAGNOSIS — Z955 Presence of coronary angioplasty implant and graft: Secondary | ICD-10-CM | POA: Diagnosis not present

## 2020-07-12 DIAGNOSIS — J449 Chronic obstructive pulmonary disease, unspecified: Secondary | ICD-10-CM | POA: Diagnosis not present

## 2020-07-12 DIAGNOSIS — E1140 Type 2 diabetes mellitus with diabetic neuropathy, unspecified: Secondary | ICD-10-CM | POA: Diagnosis not present

## 2020-07-12 DIAGNOSIS — Z885 Allergy status to narcotic agent status: Secondary | ICD-10-CM | POA: Diagnosis not present

## 2020-07-12 DIAGNOSIS — Z7901 Long term (current) use of anticoagulants: Secondary | ICD-10-CM | POA: Diagnosis not present

## 2020-07-12 DIAGNOSIS — E785 Hyperlipidemia, unspecified: Secondary | ICD-10-CM | POA: Diagnosis not present

## 2020-07-20 ENCOUNTER — Other Ambulatory Visit: Payer: Self-pay | Admitting: Internal Medicine

## 2020-07-22 DIAGNOSIS — M75101 Unspecified rotator cuff tear or rupture of right shoulder, not specified as traumatic: Secondary | ICD-10-CM | POA: Diagnosis not present

## 2020-07-22 DIAGNOSIS — Z96611 Presence of right artificial shoulder joint: Secondary | ICD-10-CM | POA: Diagnosis not present

## 2020-07-24 ENCOUNTER — Ambulatory Visit: Payer: Medicare HMO | Admitting: Internal Medicine

## 2020-07-24 DIAGNOSIS — M19011 Primary osteoarthritis, right shoulder: Secondary | ICD-10-CM | POA: Diagnosis not present

## 2020-07-26 NOTE — Progress Notes (Signed)
Chronic Care Management Pharmacy Note  08/01/2020 Name:  Brendan [name] MRN:  [id number] DOB:  21-Dec-1950  Subjective: Brendan [name] is an 70 y.o. year old male who is a primary patient of Paz, Alda Berthold, MD.  The CCM team was consulted for assistance with disease management and care coordination needs.    Engaged with patient by telephone for follow up visit in response to provider referral for pharmacy case management and/or care coordination services.   Consent to Services:  The patient was given the following information about Chronic Care Management services today, agreed to services, and gave verbal consent: 1. CCM service includes personalized support from designated clinical staff supervised by the primary care provider, including individualized plan of care and coordination with other care providers 2. 24/7 contact phone numbers for assistance for urgent and routine care needs. 3. Service will only be billed when office clinical staff spend 20 minutes or more in a month to coordinate care. 4. Only one practitioner may furnish and bill the service in a calendar month. 5.The patient may stop CCM services at any time (effective at the end of the month) by phone call to the office staff. 6. The patient will be responsible for cost sharing (co-pay) of up to 20% of the service fee (after annual deductible is met). Patient agreed to services and consent obtained.  Patient Care Team: Colon Branch, MD as PCP - General (Internal Medicine) Rigoberto Noel, MD as Consulting Physician (Pulmonary Disease) Franchot Gallo, MD as Consulting Physician (Urology) Ladene Artist, MD as Consulting Physician (Gastroenterology) Stanford Breed Denice Bors, MD as Consulting Physician (Cardiology) Shawnie Dapper, DO as Consulting Physician (Optometry) Day, Melvenia Beam, [facility] (Inactive) as Pharmacist (Pharmacist)  Recent office visits: 04/03/20: Visit w/ Dr. Larose Kells - ER follow up. Pt told to hold Xarelto and switch to  protonix BID. Pt to see GI and hold xarelto until GI bleed resolved  Recent consult visits: 04/09/20: Gertie Fey visit w/ Dr. Fuller Plan - Continue pantoprazole 89m BID. No aspirin or nsaids. Metoprolol decreased to 575mdaily? EGD Scheduled.  Hospital visits: 05/31/20 (ED) - dark stool, Hgb was stable.  Xarelto was held and started on Protonix  Social Hx:  Has been a patient since 1953r [id number] alone so feels like he can do what he wants. [place] years then moved to this area for work. Completely retired. 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  Wife had a genetic disorder. She died 2 years ago. They were married 29 years.  No children together. Wife had 3 children. He has no children of his own.  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 He enjoys working with children.  [place] Objective:  Lab Results  Component Value Date   CREATININE 0.9 06/13/2020   BUN 14 06/13/2020   GFR 95.41 09/21/2019   GFRNONAA 87 06/13/2020   GFRAA 100 06/13/2020   NA 137 06/13/2020   K 4.4 06/13/2020   CALCIUM 9.9 06/13/2020   CO2 26 (A) 06/13/2020    Lab Results  Component Value Date/Time   HGBA1C 6.2 01/22/2020 10:28 AM   HGBA1C 6.2 09/21/2019 02:36 PM   GFR 95.41 09/21/2019 02:36 PM   GFR 91.50 06/26/2019 09:35 AM   MICROALBUR <0.7 09/21/2019 02:36 PM    Last diabetic Eye exam:  Lab Results  Component Value Date/Time   HMDIABEYEEXA No Retinopathy  07/10/2019 12:00 AM    Last diabetic Foot exam: No results found for: HMDIABFOOTEX   Lab Results  Component Value Date   CHOL 136 01/22/2020   HDL 35.00 (L) 01/22/2020   LDLCALC 70 01/22/2020   LDLDIRECT 109.0 07/12/2017   TRIG 154.0 (H) 01/22/2020   CHOLHDL 4 01/22/2020    Hepatic Function Latest Ref Rng & Units 06/13/2020 04/03/2020 04/01/2020  Total Protein 6.1 - 8.1 g/dL - 6.1 6.4(L)  Albumin 3.5 - 5.0 g/dL - - 3.9   AST 14 - 40 19 21 19   ALT 10 - 40 18 23 21   Alk Phosphatase 25 - 125 89 - 55  Total Bilirubin 0.2 - 1.2 mg/dL - 0.6 0.6  Bilirubin, Direct <=0.2 mg/dL - - -    Lab Results  Component Value Date/Time   TSH 1.47 04/03/2020 03:52 PM   TSH 1.44 08/17/2018 10:38 AM    CBC Latest Ref Rng & Units 06/13/2020 04/03/2020 04/01/2020  WBC - 6.7 5.3 -  Hemoglobin 13.5 - 17.5 14.7 13.6 13.3  Hematocrit 41 - 53 46 39.1 39.4  Platelets 150 - 399 225 212 -    Lab Results  Component Value Date/Time   VD25OH 54.29 12/25/2013 10:23 AM   VD25OH 36 02/12/2011 11:03 AM   VD25OH 35 12/03/2009 09:56 PM    Clinical ASCVD: No  The 10-year ASCVD risk score Mikey Bussing DC Jr., et al., 2013) is: 35.5%   Values used to calculate the score:     Age: 34 years     Sex: Male     Is Non-Hispanic African American: No     Diabetic: Yes     Tobacco smoker: Yes     Systolic Blood Pressure: 594 mmHg     Is BP treated: Yes     HDL Cholesterol: 35 mg/dL     Total Cholesterol: 136 mg/dL    Depression screen [facility] 2/9 04/29/2020 04/03/2020 12/20/2019  Decreased Interest 1 2 1   Down, Depressed, Hopeless 0 1 0  PHQ - 2 Score 1 3 1   Altered sleeping 1 3 0  Tired, decreased energy 1 3 1   Change in appetite 0 1 0  Feeling bad or failure about yourself  0 1 0  Trouble concentrating 0 2 1  Moving slowly or fidgety/restless 1 1 0  Suicidal thoughts 0 0 0  PHQ-9 Score 4 14 3   Difficult doing work/chores - Somewhat difficult -  Some recent data might be hidden      Social History   Tobacco Use  Smoking Status Current Every Day Smoker  . Packs/day: 0.50  . Years: 51.00  . Pack years: 25.50  . Types: Cigarettes  . Start date: 1970  Smokeless Tobacco Never Used  Tobacco Comment   Still smoking max of #15/day   BP Readings from Last 3 Encounters:  04/18/20 124/74  04/09/20 110/70  04/03/20 112/76   Pulse Readings from Last 3 Encounters:  04/18/20 66  04/09/20 64  04/03/20 64   Wt Readings from Last 3  Encounters:  05/24/20 286 lb (129.7 kg)  04/18/20 272 lb (123.4 kg)  04/09/20 272 lb (123.4 kg)    Assessment/Interventions: Review of patient past medical history, allergies, medications, health status, including review of consultants reports, laboratory and other test data, was performed as part of comprehensive evaluation and provision of chronic care management services.   SDOH:  (Social Determinants of Health) assessments and interventions performed: No   CCM Care Plan  Allergies  Allergen Reactions  . Tussionex Pennkinetic Er [Hydrocod Polst-Cpm Polst Er] Hives    No current issues, only had issues when he was 70 years old  . Albuterol Palpitations  . Qvar [Beclomethasone] Palpitations    Medications Reviewed Today    Reviewed by Edythe Clarity, [facility] (Pharmacist) on 08/01/20 at 1440  Med List Status: <None>  Medication Order Taking? Sig Documenting Provider Last Dose Status Informant  acetaminophen (TYLENOL) 500 MG tablet [id number]  Take 500 mg by mouth every 6 (six) hours as needed. [provider]  Active   ALPRAZolam Duanne Moron) 0.5 MG tablet [id number]  Take 1 tablet (0.5 mg total) by mouth 2 (two) times daily as needed for anxiety. Colon Branch, MD  Active   atorvastatin (LIPITOR) 80 MG tablet [id number]  Take 1 tablet (80 mg total) by mouth daily. Colon Branch, MD  Active   carisoprodol (SOMA) 350 MG tablet [id number]  TAKE ONE TABLET BY MOUTH THREE TIMES DAILY AS NEEDED FOR MUSCLE SPASM Colon Branch, MD  Active   chlorpheniramine (EQ CHLORTABS) 4 MG tablet [id number] No Take 8 mg by mouth daily as needed for allergies or rhinitis.  [provider] Unknown Active   Cholecalciferol (VITAMIN D3 PO) [id number] No Take 2,000-5,000 Units by mouth daily.  [provider] 04/17/2020 Active   diclofenac sodium (VOLTAREN) 1 % GEL [id number] No Apply 4 g topically 4 (four) times daily as needed. Colon Branch, MD Taking Active   ezetimibe (ZETIA) 10 MG tablet [id number]  No Take 1 tablet (10 mg total) by mouth daily. Colon Branch, MD 04/17/2020 Active   FLUoxetine (PROZAC) 20 MG capsule [id number] No Take 1 capsule (20 mg total) by mouth daily. Colon Branch, MD 04/18/2020 Active   HYDROcodone-acetaminophen (NORCO/VICODIN) 5-325 MG tablet [id number]  Take 1 tablet by mouth at bedtime. Colon Branch, MD  Active   metoprolol succinate (TOPROL-XL) 50 MG 24 hr tablet [id number] No Take 50 mg by mouth daily. [provider] 04/17/2020 Active   Multiple Vitamin (MULTIVITAMIN) tablet [id number] No Take 1 tablet by mouth daily. Calcium, Magnesium, Zinc [provider] 04/17/2020 Active   pantoprazole (PROTONIX) 40 MG tablet [id number]  Take 1 tablet (40 mg total) by mouth 2 (two) times daily. Ladene Artist, MD  Active   rivaroxaban (XARELTO) 20 MG TABS tablet [id number]  Take 1 tablet (20 mg total) by mouth daily with supper. Colon Branch, MD  Active   sildenafil (VIAGRA) 100 MG tablet [id number] No Take 100 mg by mouth daily as needed for erectile dysfunction. [provider] 04/17/2020 Active   solifenacin (VESICARE) 10 MG tablet [id number]  Take 1 tablet (10 mg total) by mouth daily. Franchot Gallo, MD  Active           Patient Active Problem List   Diagnosis Date Noted  . Nonintractable episodic headache 12/14/2019  . Controlled diabetes mellitus type 2 with complications ([place]) 16/60/6004  . COPD mixed type ([place]) 04/18/2018  . OSA (obstructive sleep apnea) 10/16/2015  . Bruit 04/24/2015  . PCP NOTES >>>>>> 02/25/2015  . Hx of colonic polyps 06/25/2014  . Sinus bradycardia 06/19/2014  . Annual physical exam >>>>>>>>>>>>>>>>>> 04/28/2014  . Ejection fraction   . GERD (gastroesophageal reflux disease) 02/11/2011  . Paroxysmal atrial flutter ([place])   . HTN (hypertension)   . CAD (coronary artery disease)   . Dyslipidemia   . Obesity   . B12 deficiency   . VITAMIN D DEFICIENCY 05/20/2009  .  Polyneuropathy 09/10/2008  . BACK PAIN,  LUMBAR--UDS 08/03/2008  . ERECTILE DYSFUNCTION 02/09/2008  . BPH (benign prostatic hyperplasia) 02/09/2008  . COLONIC POLYPS 07/23/2007  . CIGARETTE SMOKER 07/23/2007  . Solitary pulmonary nodule-chest x-ray stable 08-2014, no further schedule imaging 07/23/2007  . DIVERTICULOSIS OF COLON 07/23/2007  . ANXIETY DEPRESSION 07/08/2007    Immunization History  Administered Date(s) Administered  . Fluad Quad(high Dose 65+) 02/10/2019  . Influenza Split 06/05/2012, 06/20/2013, 04/10/2014  . Influenza Whole 04/08/2009, 04/22/2010, 02/11/2011  . Influenza, High Dose Seasonal PF 03/02/2016  . Influenza-Unspecified 03/28/2015, 03/17/2017, 03/10/2018, 03/14/2020  . PFIZER(Purple Top)SARS-COV-2 Vaccination 06/29/2019, 07/19/2019, 03/14/2020  . Pneumococcal Conjugate-13 04/27/2014  . Pneumococcal Polysaccharide-23 05/19/2010, 01/22/2020  . Td 05/19/2010  . Zoster 12/25/2013  . Zoster Recombinat (Shingrix) 04/06/2019, 06/18/2019    Conditions to be addressed/monitored:  Aflutter, Pre-Diabetes, COPD, Tobacco Use Disorder, Hypertension, Hyperlipidemia/CAD, Depression/Anxiety, Overactive Bladder, GERD, Chronic Pain   Care Plan : General Pharmacy (Adult)  Updates made by Edythe Clarity, RPH since 08/01/2020 12:00 AM    Problem: Tobacco use, Depression, Pain, GERD   Priority: High  Onset Date: 08/01/2020    Long-Range Goal: Patient-Specific Goal   Start Date: 08/01/2020  Expected End Date: 01/29/2021  This Visit's Progress: On track  Priority: High  Note:   Current Barriers:  . Unable to maintain smoking cessation. . No specific medication barriers.  Pharmacist Clinical Goal(s):  Marland Kitchen Over the next 120 days, patient will achieve adherence to monitoring guidelines and medication adherence to achieve therapeutic efficacy . contact provider office for questions/concerns as evidenced notation of same in electronic health record . Work towards smoking cessation by NRT and support. through  collaboration with PharmD and provider.   Interventions: . 1:1 collaboration with Colon Branch, MD regarding development and update of comprehensive plan of care as evidenced by provider attestation and co-signature . Inter-disciplinary care team collaboration (see longitudinal plan of care) . Comprehensive medication review performed; medication list updated in electronic medical record  Depression/Anxiety (Goal: Minimize symptoms) -controlled -Current treatment:  Alprazolam 0.5 mg BID PRN   Fluoxetine 20 mg daily AM -Medications previously tried/failed: none noted -He continues to take Prozac daily  -Reports stable mood as health issues are resolving -Educated on Benefits of medication for symptom control -Recommended to continue current medication  Tobacco use (Goal Smoking cessation) -uncontrolled -Current treatment   Not using anything currently -Patient smokes After 30 minutes of waking - usually within one hour -Patient triggers include: drinking alcohol and coffee -On a scale of 1-10, reports MOTIVATION to quit is 6-7 -On a scale of 1-10, reports CONFIDENCE in quitting is 8-9 -Previous quit attempts: Tried back in October and was successful for a few weeks, then health issues caused him to want to smoke again -- He still has patches and gum from QUIT line  -Plans to try to quit again just needs to find the motivation -Currently smokes about 10 cigs per day -Recommended patient continue to work at smoking cessation, reach out for guidance and support   GERD (Goal: minimize symptoms) -controlled -Current treatment   Pantoprazole 39m twice daily -Medications previously tried: none noted  -Reports no symptoms since new medication was started -Denies any blood in stool, gastritis resolved. -Recommended to continue current medication  Pain (Goal: Minimize symptoms) -controlled -Current treatment   Carisoprodol 350 mg TID PRN  (uses once daily at night)  Voltaren  1% gel QID PRN   Hydrocodone-APAP 5-235 mg QHS   Tylenol 50107m#1  twice daily AM, Lunch -Medications previously tried: etodolac (concerns with Xarelto) -He has some leftover oxycodone from shoulder surgery which he plans to finish before he starts back on Norco.  Counseled him on increased drowsiness with oxycodone. -He reports his shoulder pain is much improved, hopes to be able to sleep in a bed again soon.  Was previously sleeping in recliner.  -Recommended to continue current medication    Patient Goals/Self-Care Activities . Over the next 120 days, patient will:  - take medications as prescribed reach out to provider with increased or changing symptoms of pain.  Reach out for support in smoking cessation  Follow Up Plan: The care management team will reach out to the patient again over the next 120 days.        Medication Assistance: None required.  Patient affirms current coverage meets needs.  Patient's preferred pharmacy is:  [facility], [address] [place] [address] Phone: [phone] Fax: [phone]  [facility] [place] - [address] [address] Phone: [phone] Fax: [phone]  [facility] [address] [place] [address] Phone: [phone] Fax: [phone]  Uses pill box? No Pt endorses 100% compliance  We discussed: Benefits of medication synchronization, packaging and delivery as well as enhanced pharmacist oversight with [facility]. Patient decided to: Continue current medication management strategy  Care Plan and Follow Up Patient Decision:  Patient agrees to Care Plan and Follow-up.  Plan: The care management team will reach out to the patient again over the next 120 days.  Beverly Milch, PharmD Clinical Pharmacist [place] [phone]

## 2020-07-29 DIAGNOSIS — M75101 Unspecified rotator cuff tear or rupture of right shoulder, not specified as traumatic: Secondary | ICD-10-CM | POA: Diagnosis not present

## 2020-07-29 DIAGNOSIS — Z96611 Presence of right artificial shoulder joint: Secondary | ICD-10-CM | POA: Diagnosis not present

## 2020-08-01 ENCOUNTER — Telehealth: Payer: Medicare HMO

## 2020-08-01 ENCOUNTER — Ambulatory Visit (INDEPENDENT_AMBULATORY_CARE_PROVIDER_SITE_OTHER): Payer: Medicare HMO

## 2020-08-01 DIAGNOSIS — F17200 Nicotine dependence, unspecified, uncomplicated: Secondary | ICD-10-CM

## 2020-08-01 DIAGNOSIS — K219 Gastro-esophageal reflux disease without esophagitis: Secondary | ICD-10-CM

## 2020-08-01 DIAGNOSIS — F341 Dysthymic disorder: Secondary | ICD-10-CM

## 2020-08-01 NOTE — Patient Instructions (Addendum)
Visit Information  Goals Addressed            This Visit's Progress   . Manage My Medicine       Timeframe:  Long-Range Goal Priority:  High Start Date:   08/01/20                          Expected End Date:   01/29/21                    Follow Up Date 11/05/20   - call for medicine refill 2 or 3 days before it runs out - use a pillbox to sort medicine    Why is this important?   . These steps will help you keep on track with your medicines.   Notes: Contact providers with help with smoking cessation!      Patient Care Plan: General Pharmacy (Adult)    Problem Identified: Tobacco use, Depression, Pain, GERD   Priority: High  Onset Date: 08/01/2020    Long-Range Goal: Patient-Specific Goal   Start Date: 08/01/2020  Expected End Date: 01/29/2021  This Visit's Progress: On track  Priority: High  Note:   Current Barriers:  . Unable to maintain smoking cessation. . No specific medication barriers.  Pharmacist Clinical Goal(s):  Marland Kitchen Over the next 120 days, patient will achieve adherence to monitoring guidelines and medication adherence to achieve therapeutic efficacy . contact provider office for questions/concerns as evidenced notation of same in electronic health record . Work towards smoking cessation by NRT and support. through collaboration with PharmD and provider.   Interventions: . 1:1 collaboration with Colon Branch, MD regarding development and update of comprehensive plan of care as evidenced by provider attestation and co-signature . Inter-disciplinary care team collaboration (see longitudinal plan of care) . Comprehensive medication review performed; medication list updated in electronic medical record  Depression/Anxiety (Goal: Minimize symptoms) -controlled -Current treatment:  Alprazolam 0.5 mg BID PRN   Fluoxetine 20 mg daily AM -Medications previously tried/failed: none noted -He continues to take Prozac daily  -Reports stable mood as health issues  are resolving -Educated on Benefits of medication for symptom control -Recommended to continue current medication  Tobacco use (Goal Smoking cessation) -uncontrolled -Current treatment   Not using anything currently -Patient smokes After 30 minutes of waking - usually within one hour -Patient triggers include: drinking alcohol and coffee -On a scale of 1-10, reports MOTIVATION to quit is 6-7 -On a scale of 1-10, reports CONFIDENCE in quitting is 8-9 -Previous quit attempts: Tried back in October and was successful for a few weeks, then health issues caused him to want to smoke again -- He still has patches and gum from QUIT line  -Plans to try to quit again just needs to find the motivation -Currently smokes about 10 cigs per day -Recommended patient continue to work at smoking cessation, reach out for guidance and support   GERD (Goal: minimize symptoms) -controlled -Current treatment   Pantoprazole 40mg  twice daily -Medications previously tried: none noted  -Reports no symptoms since new medication was started -Denies any blood in stool, gastritis resolved. -Recommended to continue current medication  Pain (Goal: Minimize symptoms) -controlled -Current treatment   Carisoprodol 350 mg TID PRN  (uses once daily at night)  Voltaren 1% gel QID PRN   Hydrocodone-APAP 5-235 mg QHS   Tylenol 500mg  #1 twice daily AM, Lunch -Medications previously tried: etodolac (concerns with Xarelto) -He has some leftover  oxycodone from shoulder surgery which he plans to finish before he starts back on Norco.  Counseled him on increased drowsiness with oxycodone. -He reports his shoulder pain is much improved, hopes to be able to sleep in a bed again soon.  Was previously sleeping in recliner.  -Recommended to continue current medication    Patient Goals/Self-Care Activities . Over the next 120 days, patient will:  - take medications as prescribed reach out to provider with increased or  changing symptoms of pain.  Reach out for support in smoking cessation  Follow Up Plan: The care management team will reach out to the patient again over the next 120 days.        The patient verbalized understanding of instructions, educational materials, and care plan provided today and agreed to receive a mailed copy of patient instructions, educational materials, and care plan.  Telephone follow up appointment with pharmacy team member scheduled for: 4 months  Edythe Clarity, [place], [phone]), [phone]. [email or web address]  Health Risks of Smoking Smoking tobacco is very bad for your health. Tobacco smoke contains many toxic chemicals that can damage every part of your body. Secondhand smoke can be harmful to those around you. Tobacco or nicotine use can cause many long-term (chronic) diseases. Smoking is difficult to quit because a chemical in tobacco, called nicotine, causes addiction or dependence. When you smoke and inhale, nicotine is absorbed quickly into the bloodstream through your lungs. Both inhaled and non-inhaled nicotine may be addictive. How can quitting affect me? There are health benefits of quitting smoking. Some benefits happen right away and others take time. Benefits may include:  Blood flow, blood pressure, heart rate, and lung capacity may begin to improve. However, any lung damage that has already occurred cannot be repaired.  Temporary respiratory symptoms, such as nasal congestion and cough, may improve over time.  Your risk of heart disease, stroke, and cancer is reduced.  The overall quality of your health may improve.  You may save money, as you will not spend money on tobacco products and may spend less money on smoking-related health issues. What can increase my risk? Smoking harms nearly every organ in the body. People who smoke tobacco have a shorter life expectancy and an  increased risk of many serious medical problems. These include:  More respiratory infections, such as colds and pneumonia.  Cancer.  Heart disease.  Stroke.  Chronic respiratory diseases.  Delayed wound healing and increased risk of complications during surgery.  Problems with reproduction, pregnancy, and childbirth, such as infertility, early (premature) births, stillbirths, and birth defects. Secondhand smoke exposure to children increases the risk of:  Sudden infant death syndrome (SIDS).  Infections in the nose, throat, or airways (respiratory infections).  Chronic respiratory symptoms.   What actions can I take to quit? Smoking is an addiction that affects both your body and your mind, and long-time habits can be hard to change. Your health care provider can recommend:  Nicotine replacement products, such as patches, gum, and nasal sprays. Use these products only as directed. Do not replace cigarette smoking with electronic cigarettes, which are commonly called e-cigarettes. The safety of e-cigarettes is not known, and some may contain harmful chemicals.  Programs and community resources, which may include group support, education, or talk therapy.  Prescription medicines to help reduce cravings.  A combination of two or more quit methods, which will increase the success of quitting.   Where to  find support Follow the recommendations from your health care provider about support groups and other assistance. You can also visit:  [employer]: www.naquitline.org or call 1-800-QUIT-NOW.  U.S. Department of Health and Human Services: www.smokefree.gov  American Lung Association: www.freedomfromsmoking.org  American Heart Association: www.heart.org Where to find more information  Centers for Disease Control and Prevention: http://www.wolf.info/  World Health Organization: RoleLink.com.br Summary  Smoking tobacco is very bad for your health. Tobacco smoke  contains many toxic chemicals that can damage every part of the body.  Smoking is difficult to quit because a chemical in tobacco, called nicotine, causes addiction or dependence.  There are immediate and long-term health benefits of quitting smoking.  A combination of two or more quit methods increases the success of quitting. This information is not intended to replace advice given to you by your health care provider. Make sure you discuss any questions you have with your health care provider. Document Revised: 07/10/2019 Document Reviewed: 07/10/2019 Elsevier Patient Education  2021 [employer].

## 2020-08-07 ENCOUNTER — Other Ambulatory Visit: Payer: Self-pay

## 2020-08-07 ENCOUNTER — Encounter: Payer: Self-pay | Admitting: Internal Medicine

## 2020-08-07 ENCOUNTER — Ambulatory Visit (INDEPENDENT_AMBULATORY_CARE_PROVIDER_SITE_OTHER): Payer: Medicare Other | Admitting: Internal Medicine

## 2020-08-07 VITALS — BP 120/77 | HR 63 | Temp 97.9°F | Ht 78.0 in | Wt 277.0 lb

## 2020-08-07 DIAGNOSIS — M545 Low back pain, unspecified: Secondary | ICD-10-CM

## 2020-08-07 DIAGNOSIS — E118 Type 2 diabetes mellitus with unspecified complications: Secondary | ICD-10-CM

## 2020-08-07 DIAGNOSIS — K921 Melena: Secondary | ICD-10-CM | POA: Diagnosis not present

## 2020-08-07 DIAGNOSIS — F341 Dysthymic disorder: Secondary | ICD-10-CM

## 2020-08-07 DIAGNOSIS — G8929 Other chronic pain: Secondary | ICD-10-CM | POA: Diagnosis not present

## 2020-08-07 DIAGNOSIS — I10 Essential (primary) hypertension: Secondary | ICD-10-CM | POA: Diagnosis not present

## 2020-08-07 DIAGNOSIS — G4700 Insomnia, unspecified: Secondary | ICD-10-CM | POA: Diagnosis not present

## 2020-08-07 MED ORDER — HYDROCODONE-ACETAMINOPHEN 5-325 MG PO TABS: 1.0000 | ORAL_TABLET | Freq: Every day | ORAL | 0 refills | Status: DC

## 2020-08-07 MED ORDER — METHOCARBAMOL 500 MG PO TABS: 500.0000 mg | ORAL_TABLET | Freq: Every evening | ORAL | 3 refills | Status: DC | PRN

## 2020-08-07 NOTE — Progress Notes (Signed)
Subjective:    Patient ID: Brendan [name], male    DOB: 03-26-1951, 70 y.o.   MRN: [id number]  DOS:  08/07/2020 Type of visit - description: Follow-up Since the last office visit, shoulder surgery, has seen his surgeon and physical therapy, things are going in the right direction. He did report some lack of energy since the surgery, he thinks is due to inactivity and deconditioning. Denies chest pain or difficulty breathing No edema.  Wt Readings from Last 3 Encounters:  08/07/20 277 lb (125.6 kg)  05/24/20 286 lb (129.7 kg)  04/18/20 272 lb (123.4 kg)      Review of Systems See above   Past Medical History:  Diagnosis Date  . Allergy   . Anxiety and depression   . Arthritis   . Atrial flutter [facility])    New diagnosis November 10, 2010, rate controlled  . B12 deficiency   . Blurred vision    episode with confusion, evaluated Dr. Erling Cruz  . BPH (benign prostatic hypertrophy)   . CAD (coronary artery disease)    stent, 2001 / nuclear, June, 2010, no ischemia  . Diverticulosis of colon   . Dyslipidemia   . ED (erectile dysfunction)   . Ejection fraction   . GERD (gastroesophageal reflux disease)    pt denies, take omeprazole d/t taking several medications not GERD per pt  . History of colonic polyps   . HTN (hypertension)   . Lumbar back pain   . Obesity   . OSA (obstructive sleep apnea)   . PAF (paroxysmal atrial fibrillation) ([place])   . Peripheral neuropathy    lower extremities  . Prediabetes   . Pulmonary nodule   . Scalp lesion    November, 2011  . Sinus bradycardia   . Sleep apnea    no c-pap  . Stroke ([place])    tia  . Tobacco use    hx of  . Vitamin D deficiency     Past Surgical History:  Procedure Laterality Date  . arm fracture surgery     remotely  . cardica stent x2    . CLAVICLE SURGERY     remotely  . REVERSE TOTAL SHOULDER ARTHROPLASTY Right 07/11/2020  . TONSILLECTOMY      Allergies as of 08/07/2020      Reactions   Tussionex Pennkinetic Er  [hydrocod Polst-cpm Polst Er] Hives   No current issues, only had issues when he was 70 years old   Albuterol Palpitations   Qvar [beclomethasone] Palpitations      Medication List       Accurate as of August 07, 2020 11:59 PM. If you have any questions, ask your nurse or doctor.        STOP taking these medications   carisoprodol 350 MG tablet Commonly known as: SOMA Stopped by: [name], MD     TAKE these medications   acetaminophen 500 MG tablet Commonly known as: TYLENOL Take 500 mg by mouth every 6 (six) hours as needed.   ALPRAZolam 0.5 MG tablet Commonly known as: XANAX Take 1 tablet (0.5 mg total) by mouth 2 (two) times daily as needed for anxiety.   atorvastatin 80 MG tablet Commonly known as: LIPITOR Take 1 tablet (80 mg total) by mouth daily.   chlorpheniramine 4 MG tablet Commonly known as: CHLOR-TRIMETON Take 8 mg by mouth daily as needed for allergies or rhinitis.   diclofenac sodium 1 % Gel Commonly known as: VOLTAREN Apply 4 g topically 4 (  four) times daily as needed.   ezetimibe 10 MG tablet Commonly known as: ZETIA Take 1 tablet (10 mg total) by mouth daily.   FLUoxetine 20 MG capsule Commonly known as: PROZAC Take 1 capsule (20 mg total) by mouth daily.   HYDROcodone-acetaminophen 5-325 MG tablet Commonly known as: NORCO/VICODIN Take 1 tablet by mouth at bedtime.   methocarbamol 500 MG tablet Commonly known as: ROBAXIN Take 1 tablet (500 mg total) by mouth at bedtime as needed for muscle spasms. What changed:   when to take this  reasons to take this Changed by: [name], MD   metoprolol succinate 50 MG 24 hr tablet Commonly known as: TOPROL-XL Take 50 mg by mouth daily.   multivitamin tablet Take 1 tablet by mouth daily. Calcium, Magnesium, Zinc   pantoprazole 40 MG tablet Commonly known as: PROTONIX Take 1 tablet (40 mg total) by mouth 2 (two) times daily.   rivaroxaban 20 MG Tabs tablet Commonly known as: Xarelto Take 1  tablet (20 mg total) by mouth daily with supper.   sildenafil 100 MG tablet Commonly known as: VIAGRA Take 100 mg by mouth daily as needed for erectile dysfunction.   solifenacin 10 MG tablet Commonly known as: VESICARE Take 1 tablet (10 mg total) by mouth daily.   VITAMIN D3 PO Take 2,000-5,000 Units by mouth daily.          Objective:   Physical Exam BP 120/77 (BP Location: Left Arm, Patient Position: Sitting, Cuff Size: Large)   Pulse 63   Temp 97.9 F (36.6 C) (Oral)   Ht 6\' 6"  (1.981 m)   Wt 277 lb (125.6 kg)   SpO2 99%   BMI 32.01 kg/m  General:   Well developed, NAD, BMI noted. HEENT:  Normocephalic . Face symmetric, atraumatic Lungs:  CTA B Normal respiratory effort, no intercostal retractions, no accessory muscle use. Heart: RRR,  no murmur.  Lower extremities: no pretibial edema bilaterally  Skin: Not pale. Not jaundice Neurologic:  alert & oriented X3.  Speech normal, gait appropriate for age and unassisted Psych--  Cognition and judgment appear intact.  Cooperative with normal attention span and concentration.  Behavior appropriate. No anxious or depressed appearing.      Assessment      Assessment DM (A1C increased to 6.8 on 12/2017) Peripheral neuropathy (no pain, insensitive type) HTN Hyperlipidemia Anxiety depression -- on xanax  Chronic rhinitis:  Sees ENT CV: Dr Stanford Breed --CAD. Stress test okay 05-2015 -- Paroxysmal Atrial fibrillation --TIA --Abdominal bruit: No AAA. Ultrasound 05/2015 PULM:  OSA - + sleep study 03-2015 ,Cpap  intolerant   COPD: smoker, CT chest 12/2017 :changes noted   GU: BPH, Hypogonadism, ED. Sees urology B12 deficiency Pain mngmt: has back-shoulder  pain and neuropathy  Soma switch to methocarbamol 08-2020, on  Vicodin.  Etodolac DC 2016  per Dr Larose Kells d/t concerns regards anticoagulation. Hydrocodone dose needed to be increased Has seen Dr. Erling Cruz several times since 2010:w/u  included SPE, ACE, TSH, B12  (slightly low with a normal methylmalonic acid) 08-2008 had a lumbar spine MRI with multilevel spondylosis. Dr. 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 DM: Diet controlled, last A1c was 6.2, recheck A1c, micro.   Shoulder arthroplasty: Performed approximately 07/09/2020, R reverse total shoulder arthroplasty Melena: See last visit, subsequently was  seen by GI, had a EGD 04/18/2020, + gastritis, erythematous antrum, duodenal erosions, Rx PPI twice daily for a year, biopsy: Negative H. pylori.  Reactive gastropathy, chronic gastritis, acute on chronic esophagitis, no fungi Currently with no symptoms.  We will check a CBC for completeness HTN: On metoprolol, recent BMP okay. Pain management: Shoulder pain significantly improved since he had surgery few weeks ago.  He took oxycodone temporarily but now is back on hydrocodone, Rx sent. Instead of Soma, was prescribed methocarbamol nightly, it worked, will switch to methocarbamol. Insomnia, depression, on Xanax, UDS and contract today. Recommend Tdap at the pharmacy RTC 3 months   This visit occurred during the SARS-CoV-2 public health emergency.  Safety protocols were in place, including screening questions prior to the visit, additional usage of staff PPE, and extensive cleaning of exam room while observing appropriate contact time as indicated for disinfecting solutions.

## 2020-08-07 NOTE — Patient Instructions (Addendum)
Please bring a copy of your advanced directives (living will and healthcare power of attorney). This is for your chart.   Per our records you are due for an eye exam. Please contact your eye doctor to schedule an appointment. Please have them send copies of your office visit notes to [place]. Our fax number is [phone].     GO TO THE LAB : Get the blood work     GO TO THE FRONT DESK, PLEASE SCHEDULE YOUR APPOINTMENTS Come back for   A check up in 3 months

## 2020-08-08 DIAGNOSIS — G4700 Insomnia, unspecified: Secondary | ICD-10-CM | POA: Insufficient documentation

## 2020-08-08 LAB — CBC WITH DIFFERENTIAL/PLATELET
Basophils Absolute: 0.1 10*3/uL (ref 0.0–0.1)
Basophils Relative: 1.1 % (ref 0.0–3.0)
Eosinophils Absolute: 0.2 10*3/uL (ref 0.0–0.7)
Eosinophils Relative: 4.4 % (ref 0.0–5.0)
HCT: 40 % (ref 39.0–52.0)
Hemoglobin: 13.2 g/dL (ref 13.0–17.0)
Lymphocytes Relative: 32 % (ref 12.0–46.0)
Lymphs Abs: 1.6 10*3/uL (ref 0.7–4.0)
MCHC: 33.1 g/dL (ref 30.0–36.0)
MCV: 81.9 fl (ref 78.0–100.0)
Monocytes Absolute: 0.3 10*3/uL (ref 0.1–1.0)
Monocytes Relative: 7 % (ref 3.0–12.0)
Neutro Abs: 2.8 10*3/uL (ref 1.4–7.7)
Neutrophils Relative %: 55.5 % (ref 43.0–77.0)
Platelets: 296 10*3/uL (ref 150.0–400.0)
RBC: 4.88 Mil/uL (ref 4.22–5.81)
RDW: 16.2 % — ABNORMAL HIGH (ref 11.5–15.5)
WBC: 5 10*3/uL (ref 4.0–10.5)

## 2020-08-08 LAB — MICROALBUMIN / CREATININE URINE RATIO
Creatinine,U: 52.1 mg/dL
Microalb Creat Ratio: 1.3 mg/g (ref 0.0–30.0)
Microalb, Ur: <0.7 mg/dL (ref 0.0–1.9)

## 2020-08-08 LAB — HEMOGLOBIN A1C: Hgb A1c MFr Bld: 6.4 % (ref 4.6–6.5)

## 2020-08-08 NOTE — Assessment & Plan Note (Signed)
Available labs reviewed,  recent BMP, FLP, CBC satisfactory. DM: Diet controlled, last A1c was 6.2, recheck A1c, micro.   Shoulder arthroplasty: Performed approximately 07/09/2020, R reverse total shoulder arthroplasty Melena: See last visit, subsequently was seen by GI, had a EGD 04/18/2020, + gastritis, erythematous antrum, duodenal erosions, Rx PPI twice daily for a year, biopsy: Negative H. pylori.  Reactive gastropathy, chronic gastritis, acute on chronic esophagitis, no fungi Currently with no symptoms.  We will check a CBC for completeness HTN: On metoprolol, recent BMP okay. Pain management: Shoulder pain significantly improved since he had surgery few weeks ago.  He took oxycodone temporarily but now is back on hydrocodone, Rx sent. Instead of Soma, was prescribed methocarbamol nightly, it worked, will switch to methocarbamol. Insomnia, depression, on Xanax, UDS and contract today. Recommend Tdap at the pharmacy RTC 3 months

## 2020-08-09 DIAGNOSIS — Z96611 Presence of right artificial shoulder joint: Secondary | ICD-10-CM | POA: Diagnosis not present

## 2020-08-09 LAB — DRUG MONITORING, PANEL 8 WITH CONFIRMATION, URINE
6 Acetylmorphine: NEGATIVE ng/mL (ref <10)
Alcohol Metabolites: NEGATIVE ng/mL
Alphahydroxyalprazolam: 103 ng/mL — ABNORMAL HIGH (ref <25)
Alphahydroxymidazolam: NEGATIVE ng/mL (ref <50)
Alphahydroxytriazolam: NEGATIVE ng/mL (ref <50)
Aminoclonazepam: NEGATIVE ng/mL (ref <25)
Amphetamines: NEGATIVE ng/mL (ref <500)
Benzodiazepines: POSITIVE ng/mL — AB (ref <100)
Buprenorphine, Urine: NEGATIVE ng/mL (ref <5)
Cocaine Metabolite: NEGATIVE ng/mL (ref <150)
Codeine: NEGATIVE ng/mL (ref <50)
Creatinine: 50.9 mg/dL
Hydrocodone: 78 ng/mL — ABNORMAL HIGH (ref <50)
Hydromorphone: NEGATIVE ng/mL (ref <50)
Hydroxyethylflurazepam: NEGATIVE ng/mL (ref <50)
Lorazepam: NEGATIVE ng/mL (ref <50)
MDMA: NEGATIVE ng/mL (ref <500)
Marijuana Metabolite: NEGATIVE ng/mL (ref <20)
Morphine: NEGATIVE ng/mL (ref <50)
Nordiazepam: NEGATIVE ng/mL (ref <50)
Norhydrocodone: 101 ng/mL — ABNORMAL HIGH (ref <50)
Opiates: POSITIVE ng/mL — AB (ref <100)
Oxazepam: NEGATIVE ng/mL (ref <50)
Oxidant: NEGATIVE ug/mL
Oxycodone: NEGATIVE ng/mL (ref <100)
Temazepam: NEGATIVE ng/mL (ref <50)
pH: 7.1 (ref 4.5–9.0)

## 2020-08-09 LAB — DM TEMPLATE

## 2020-08-14 DIAGNOSIS — Z96611 Presence of right artificial shoulder joint: Secondary | ICD-10-CM | POA: Diagnosis not present

## 2020-08-19 ENCOUNTER — Telehealth: Payer: Self-pay | Admitting: Internal Medicine

## 2020-08-19 DIAGNOSIS — R739 Hyperglycemia, unspecified: Secondary | ICD-10-CM

## 2020-08-19 DIAGNOSIS — E785 Hyperlipidemia, unspecified: Secondary | ICD-10-CM

## 2020-08-19 MED ORDER — FLUOXETINE HCL 20 MG PO CAPS: 20.0000 mg | ORAL_CAPSULE | Freq: Every day | ORAL | 1 refills | Status: DC

## 2020-08-19 MED ORDER — EZETIMIBE 10 MG PO TABS: 10.0000 mg | ORAL_TABLET | Freq: Every day | ORAL | 3 refills | Status: DC

## 2020-08-19 MED ORDER — ATORVASTATIN CALCIUM 80 MG PO TABS: 80.0000 mg | ORAL_TABLET | Freq: Every day | ORAL | 1 refills | Status: DC

## 2020-08-19 MED ORDER — RIVAROXABAN 20 MG PO TABS: 20.0000 mg | ORAL_TABLET | Freq: Every day | ORAL | 3 refills | Status: DC

## 2020-08-19 NOTE — Telephone Encounter (Signed)
Patient states he switch pharmacy to [facility]. He need all rx to be sent to [facility].  All 90 days supply if possible  [place], [address], [address] Phone:  [phone]  Fax:  [phone]

## 2020-08-19 NOTE — Telephone Encounter (Signed)
Rxs sent

## 2020-08-21 DIAGNOSIS — Z96611 Presence of right artificial shoulder joint: Secondary | ICD-10-CM | POA: Diagnosis not present

## 2020-08-23 DIAGNOSIS — Z96611 Presence of right artificial shoulder joint: Secondary | ICD-10-CM | POA: Diagnosis not present

## 2020-08-26 ENCOUNTER — Telehealth: Payer: Self-pay | Admitting: Internal Medicine

## 2020-08-26 DIAGNOSIS — F341 Dysthymic disorder: Secondary | ICD-10-CM

## 2020-08-26 NOTE — Telephone Encounter (Signed)
Requesting: alprazolam 0.5mg  Contract: 08/07/2020 UDS: 08/07/2020 Last Visit: 08/07/2020 Next Visit: 11/08/2020 Last Refill: 06/20/2020 #180 and 0RF  Pt states he has enough until Sunday but is requesting to go through [facility] mail order. Looks like it was sent on 06/20/20 for #180 tablets. Can you check and see if he received the full 90 day supply?   Please Advise

## 2020-08-26 NOTE — Telephone Encounter (Signed)
Patient states he is trying to wing his self off hydrocodone and start taking tylenol. Patient would like to make PCP aware

## 2020-08-26 NOTE — Telephone Encounter (Signed)
FYI

## 2020-08-26 NOTE — Telephone Encounter (Signed)
   Patient is requesting a 90days supply. Patient states he will be out of medication on Sunday.    Medication: ALPRAZolam (XANAX) 0.5 MG tablet    Has the patient contacted their pharmacy? No. (If no, request that the patient contact the pharmacy for the refill.) (If yes, when and what did the pharmacy advise?)  Preferred Pharmacy (with phone number or street name):  [place], [address], [address] Phone:  [phone]  Fax:  [phone]       Agent: Please be advised that RX refills may take up to 3 business days. We ask that you follow-up with your pharmacy.

## 2020-08-27 MED ORDER — ALPRAZOLAM 0.5 MG PO TABS: 0.5000 mg | ORAL_TABLET | Freq: Two times a day (BID) | ORAL | 0 refills | Status: DC | PRN

## 2020-08-27 NOTE — Telephone Encounter (Signed)
Rx sent, PDMP okay

## 2020-08-27 NOTE — Telephone Encounter (Signed)
Noted, thx.

## 2020-08-28 DIAGNOSIS — Z96611 Presence of right artificial shoulder joint: Secondary | ICD-10-CM | POA: Diagnosis not present

## 2020-09-04 DIAGNOSIS — Z96611 Presence of right artificial shoulder joint: Secondary | ICD-10-CM | POA: Diagnosis not present

## 2020-09-11 DIAGNOSIS — Z96611 Presence of right artificial shoulder joint: Secondary | ICD-10-CM | POA: Diagnosis not present

## 2020-09-18 ENCOUNTER — Encounter: Payer: Self-pay | Admitting: Internal Medicine

## 2020-09-18 DIAGNOSIS — H43813 Vitreous degeneration, bilateral: Secondary | ICD-10-CM | POA: Diagnosis not present

## 2020-09-18 DIAGNOSIS — H43822 Vitreomacular adhesion, left eye: Secondary | ICD-10-CM | POA: Diagnosis not present

## 2020-09-18 DIAGNOSIS — H02831 Dermatochalasis of right upper eyelid: Secondary | ICD-10-CM | POA: Diagnosis not present

## 2020-09-18 DIAGNOSIS — H35033 Hypertensive retinopathy, bilateral: Secondary | ICD-10-CM | POA: Diagnosis not present

## 2020-09-18 DIAGNOSIS — E119 Type 2 diabetes mellitus without complications: Secondary | ICD-10-CM | POA: Diagnosis not present

## 2020-09-18 LAB — HM DIABETES EYE EXAM

## 2020-09-23 ENCOUNTER — Other Ambulatory Visit: Payer: Self-pay | Admitting: Internal Medicine

## 2020-09-23 DIAGNOSIS — F341 Dysthymic disorder: Secondary | ICD-10-CM

## 2020-09-24 ENCOUNTER — Other Ambulatory Visit: Payer: Self-pay

## 2020-09-24 DIAGNOSIS — K2970 Gastritis, unspecified, without bleeding: Secondary | ICD-10-CM

## 2020-09-24 DIAGNOSIS — K229 Disease of esophagus, unspecified: Secondary | ICD-10-CM

## 2020-09-24 MED ORDER — PANTOPRAZOLE SODIUM 40 MG PO TBEC: 40.0000 mg | DELAYED_RELEASE_TABLET | Freq: Two times a day (BID) | ORAL | 1 refills | Status: DC

## 2020-09-25 DIAGNOSIS — Z96611 Presence of right artificial shoulder joint: Secondary | ICD-10-CM | POA: Diagnosis not present

## 2020-09-29 ENCOUNTER — Encounter: Payer: Self-pay | Admitting: Internal Medicine

## 2020-10-01 MED ORDER — METHOCARBAMOL 500 MG PO TABS: 500.0000 mg | ORAL_TABLET | Freq: Every evening | ORAL | 1 refills | Status: DC | PRN

## 2020-10-16 DIAGNOSIS — Z96611 Presence of right artificial shoulder joint: Secondary | ICD-10-CM | POA: Diagnosis not present

## 2020-10-21 ENCOUNTER — Other Ambulatory Visit: Payer: Self-pay | Admitting: Internal Medicine

## 2020-10-21 DIAGNOSIS — F341 Dysthymic disorder: Secondary | ICD-10-CM

## 2020-10-22 NOTE — Telephone Encounter (Signed)
Paz Pt   Requesting: alprazolam 0.5mg  Contract:08/07/2020 UDS: 08/07/2020 Last Visit:08/07/2020 Next Visit: 11/08/2020 Last Refill: 08/27/2020 #180 and 0RF Pt sig: 1 tab bid prn   Dr. Etter Sjogren- can you tell on PDMP if he received the full 90 day supply on 08/27/20?  Please Advise

## 2020-11-01 ENCOUNTER — Other Ambulatory Visit: Payer: Self-pay | Admitting: Internal Medicine

## 2020-11-01 ENCOUNTER — Telehealth: Payer: Self-pay | Admitting: Pharmacist

## 2020-11-01 DIAGNOSIS — F341 Dysthymic disorder: Secondary | ICD-10-CM

## 2020-11-01 NOTE — Chronic Care Management (AMB) (Signed)
Chronic Care Management Pharmacy Assistant   Name: Brendan [name]  MRN: [id number] DOB: [birth year]   Reason for Encounter: Disease State General Adherence    Recent office visits:  08/07/20 Brendan November, MD (PCP) General follow up. Follow up in 3 months.   Recent consult visits:  10/16/20 Brendan [name] (Orthopedics) [place]. 08/21/20  Brendan [name] (Orthopedics) Follow up shoulder replacement.Follow up in 6-8 weeks. 07/24/20  Brendan [name] (Orthopedics) Follow up shoulder replacement. 07/24/20  Brendan [name] (Orthopedics)Osteoarthritis 06/21/20 Brendan Jo, PA-C (Physical therapy)   Hospital visits:  None in previous 6 months  Medications: Outpatient Encounter Medications as of 11/01/2020  Medication Sig  . acetaminophen (TYLENOL) 500 MG tablet Take 500 mg by mouth every 6 (six) hours as needed.  . ALPRAZolam (XANAX) 0.5 MG tablet Take 1 tablet (0.5 mg total) by mouth 2 (two) times daily as needed for anxiety.  Marland Kitchen atorvastatin (LIPITOR) 80 MG tablet Take 1 tablet (80 mg total) by mouth daily.  . chlorpheniramine (CHLOR-TRIMETON) 4 MG tablet Take 8 mg by mouth daily as needed for allergies or rhinitis.   . Cholecalciferol (VITAMIN D3 PO) Take 2,000-5,000 Units by mouth daily.   Marland Kitchen ezetimibe (ZETIA) 10 MG tablet Take 1 tablet (10 mg total) by mouth daily.  Marland Kitchen FLUoxetine (PROZAC) 20 MG capsule Take 1 capsule (20 mg total) by mouth daily.  Marland Kitchen HYDROcodone-acetaminophen (NORCO/VICODIN) 5-325 MG tablet Take 1 tablet by mouth at bedtime.  . methocarbamol (ROBAXIN) 500 MG tablet Take 1 tablet (500 mg total) by mouth at bedtime as needed for muscle spasms.  . metoprolol succinate (TOPROL-XL) 50 MG 24 hr tablet Take 50 mg by mouth daily.  . Multiple Vitamin (MULTIVITAMIN) tablet Take 1 tablet by mouth daily. Calcium, Magnesium, Zinc  . pantoprazole (PROTONIX) 40 MG tablet Take 1 tablet (40 mg total) by mouth 2 (two) times daily.  . rivaroxaban (XARELTO) 20  MG TABS tablet Take 1 tablet (20 mg total) by mouth daily with supper.  . sildenafil (VIAGRA) 100 MG tablet Take 100 mg by mouth daily as needed for erectile dysfunction.  . solifenacin (VESICARE) 10 MG tablet Take 1 tablet (10 mg total) by mouth daily.   No facility-administered encounter medications on file as of 11/01/2020.   Have you had any problems recently with your health? Patient states he has had lack of energy. Patient states he started taking geritol. Patient also states he was told he could possibly have CMT disease.  Have you had any problems with your pharmacy? Patient states he does not have any problems with his pharmacy.  What issues or side effects are you having with your medications? Patient states he constantly has dry mouth but is unsure if it is a side effect.  What would you like me to pass along to Brendan [name] for them to help you with?  Patient states he has had lack of energy. Patient states he started taking geritol. Patient also states he was told he could possibly have CMT disease.Patient states he received a letter form his insurance stating he has gaps that he needs to meet. Patient states he will bring a list to his appointment on 11/08/20 with Dr.Paz.Patient states his appointment about the CMT disease is on 01/21/21 at [facility].   What can we do to take care of you better? Patient states there is nothing at this time.   Star Rating Drugs: Atorvastatin 80 mg last filled 04/29/20 90 DS  [facility] Clinical Pharmacist Assistant [phone]

## 2020-11-05 ENCOUNTER — Other Ambulatory Visit: Payer: Self-pay

## 2020-11-05 MED ORDER — METOPROLOL SUCCINATE ER 50 MG PO TB24: 50.0000 mg | ORAL_TABLET | Freq: Every day | ORAL | 1 refills | Status: DC

## 2020-11-08 ENCOUNTER — Ambulatory Visit (INDEPENDENT_AMBULATORY_CARE_PROVIDER_SITE_OTHER): Payer: Medicare Other | Admitting: Internal Medicine

## 2020-11-08 ENCOUNTER — Other Ambulatory Visit: Payer: Self-pay

## 2020-11-08 ENCOUNTER — Encounter: Payer: Self-pay | Admitting: Internal Medicine

## 2020-11-08 VITALS — BP 122/74 | HR 64 | Temp 97.9°F | Resp 16 | Ht 78.0 in | Wt 279.1 lb

## 2020-11-08 DIAGNOSIS — I4892 Unspecified atrial flutter: Secondary | ICD-10-CM

## 2020-11-08 DIAGNOSIS — E559 Vitamin D deficiency, unspecified: Secondary | ICD-10-CM | POA: Diagnosis not present

## 2020-11-08 DIAGNOSIS — F341 Dysthymic disorder: Secondary | ICD-10-CM

## 2020-11-08 DIAGNOSIS — I10 Essential (primary) hypertension: Secondary | ICD-10-CM

## 2020-11-08 DIAGNOSIS — R5382 Chronic fatigue, unspecified: Secondary | ICD-10-CM

## 2020-11-08 DIAGNOSIS — E538 Deficiency of other specified B group vitamins: Secondary | ICD-10-CM | POA: Diagnosis not present

## 2020-11-08 NOTE — Patient Instructions (Addendum)
Check the  blood pressure once a week BP GOAL is between 110/65 and  135/85. If it is consistently higher or lower, let me know  Please consider be reevaluated for sleep apnea  Please proceed with Tdap (tetanus shot) at your local pharmacy  [place] LAB : Get the blood work     [place], [place] back for a checkup in 4 months

## 2020-11-08 NOTE — Progress Notes (Signed)
Subjective:    Patient ID: Brendan [name], male    DOB: [birth year], 70 y.o.   MRN: [id number]  DOS:  11/08/2020 Type of visit - description: F/U  Here for routine follow-up. His main concern today is fatigue, lack of energy. This has been a chronic issue for years but in the last few months is worse.  His PHQ-9 score 12 which is moderate but he is absolutely certain he is not depressed, he admits that occasionally he get the blues but nothing persistent.  He continues to snoring  Chronic pain, this is relatively well controlled at this point  History of B12 deficiency, only lately started to take supplements.   Review of Systems Specifically denies chest pain or difficulty breathing No palpitations No blood in the stools or abdominal pain  Past Medical History:  Diagnosis Date  . Allergy   . Anxiety and depression   . Arthritis   . Atrial flutter [facility])    New diagnosis November 10, 2010, rate controlled  . B12 deficiency   . Blurred vision    episode with confusion, evaluated Dr. Erling Cruz  . BPH (benign prostatic hypertrophy)   . CAD (coronary artery disease)    stent, 2001 / nuclear, June, 2010, no ischemia  . Diverticulosis of colon   . Dyslipidemia   . ED (erectile dysfunction)   . Ejection fraction   . GERD (gastroesophageal reflux disease)    pt denies, take omeprazole d/t taking several medications not GERD per pt  . History of colonic polyps   . HTN (hypertension)   . Lumbar back pain   . Obesity   . OSA (obstructive sleep apnea)   . PAF (paroxysmal atrial fibrillation) ([place])   . Peripheral neuropathy    lower extremities  . Prediabetes   . Pulmonary nodule   . Scalp lesion    November, 2011  . Sinus bradycardia   . Sleep apnea    no c-pap  . Stroke ([place])    tia  . Tobacco use    hx of  . Vitamin D deficiency     Past Surgical History:  Procedure Laterality Date  . arm fracture surgery     remotely  . cardica stent x2    . CLAVICLE SURGERY      remotely  . REVERSE TOTAL SHOULDER ARTHROPLASTY Right 07/11/2020  . TONSILLECTOMY      Allergies as of 11/08/2020      Reactions   Tussionex Pennkinetic Er [hydrocod Polst-cpm Polst Er] Hives   No current issues, only had issues when he was 70 years old   Albuterol Palpitations   Qvar [beclomethasone] Palpitations      Medication List       Accurate as of November 08, 2020 11:59 PM. If you have any questions, ask your nurse or doctor.        STOP taking these medications   Metoprolol Tartrate 75 MG Tabs Stopped by: [name], MD     TAKE these medications   acetaminophen 500 MG tablet Commonly known as: TYLENOL Take 500 mg by mouth every 6 (six) hours as needed.   ALPRAZolam 0.5 MG tablet Commonly known as: XANAX Take 1 tablet (0.5 mg total) by mouth 2 (two) times daily as needed for anxiety.   atorvastatin 80 MG tablet Commonly known as: LIPITOR Take 1 tablet (80 mg total) by mouth daily.   chlorpheniramine 4 MG tablet Commonly known as: CHLOR-TRIMETON Take 8 mg by mouth daily as  needed for allergies or rhinitis.   ezetimibe 10 MG tablet Commonly known as: ZETIA Take 1 tablet (10 mg total) by mouth daily.   FLUoxetine 20 MG capsule Commonly known as: PROZAC Take 1 capsule (20 mg total) by mouth daily.   HYDROcodone-acetaminophen 5-325 MG tablet Commonly known as: NORCO/VICODIN Take 1 tablet by mouth at bedtime. What changed:   when to take this  reasons to take this   methocarbamol 500 MG tablet Commonly known as: ROBAXIN Take 1 tablet (500 mg total) by mouth at bedtime as needed for muscle spasms.   metoprolol succinate 50 MG 24 hr tablet Commonly known as: TOPROL-XL Take 75 mg by mouth daily. Take with or immediately following a meal. What changed: Another medication with the same name was removed. Continue taking this medication, and follow the directions you see here. Changed by: [name], MD   multivitamin tablet Take 1 tablet by mouth daily.  Calcium, Magnesium, Zinc   pantoprazole 40 MG tablet Commonly known as: PROTONIX Take 1 tablet (40 mg total) by mouth 2 (two) times daily.   rivaroxaban 20 MG Tabs tablet Commonly known as: Xarelto Take 1 tablet (20 mg total) by mouth daily with supper.   sildenafil 100 MG tablet Commonly known as: VIAGRA Take 100 mg by mouth daily as needed for erectile dysfunction.   solifenacin 10 MG tablet Commonly known as: VESICARE Take 1 tablet (10 mg total) by mouth daily.   VITAMIN D3 PO Take 2,000-5,000 Units by mouth daily.          Objective:   Physical Exam BP 122/74 (BP Location: Left Arm, Patient Position: Sitting, Cuff Size: Normal)   Pulse 64   Temp 97.9 F (36.6 C) (Oral)   Resp 16   Ht 6\' 6"  (1.981 m)   Wt 279 lb 2 oz (126.6 kg)   SpO2 97%   BMI 32.26 kg/m  General:   Well developed, NAD, BMI noted. HEENT:  Normocephalic . Face symmetric, atraumatic Lungs:  CTA B Normal respiratory effort, no intercostal retractions, no accessory muscle use. Heart: RRR,  no murmur.  Lower extremities: no pretibial edema bilaterally  Skin: Not pale. Not jaundice Neurologic:  alert & oriented X3.  Speech normal, gait appropriate for age and unassisted Psych--  Cognition and judgment appear intact.  Cooperative with normal attention span and concentration.  Behavior appropriate. No anxious or depressed appearing.      Assessment     Assessment DM (A1C increased to 6.8 on 12/2017) Peripheral neuropathy (no pain, insensitive type) HTN Hyperlipidemia Anxiety depression -- on xanax  Chronic rhinitis:  Sees ENT CV: Dr Stanford Breed --CAD. Stress test okay 05-2015 -- Paroxysmal Atrial fibrillation --TIA --Abdominal bruit: No AAA. Ultrasound 05/2015 PULM:  OSA - + sleep study 03-2015 ,Cpap  intolerant   COPD: smoker, CT chest 12/2017 :changes noted   GU: BPH, Hypogonadism, ED. Sees urology B12 and vitamin D deficiency Pain mngmt: has back-shoulder  pain and neuropathy   Soma switch to methocarbamol 08-2020, on  Vicodin.  Etodolac DC 2016  per Dr Larose Kells d/t concerns regards anticoagulation. Hydrocodone dose needed to be increased Has seen Dr. Erling Cruz several times since 2010:w/u  included SPE, ACE, TSH, B12 (slightly low with a normal methylmalonic acid) 08-2008 had a lumbar spine MRI with multilevel spondylosis. Dr. 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 This is likely multifactorial however untreated sleep apnea is probably playing the biggest role. Although his PHQ-9 is 12, he denies any persistent depression and he seems to be doing well emotionally today. Last TSH 03-2020 okay. Plan: General labs, see next, strongly advised him to consider reassessment for sleep apnea as there are new treatment modalities different from CPAP. OSA: See above HTN: BP today is very good, recommend to check ambulatory BPs to be sure that he is not overtreated.  Currently on metoprolol.  Check a BMP and CBC. Chronic pain, pain management: Has decreased the consumption of hydrocodone significantly, takes it prn only. Anxiety depression: Seems to be controlled on fluoxetine and Xanax, see comments above. H/o vitamin D and B12 deficiency: Only recently started to take B12 supplements, checking levels. Paroxysmal A. fib: He seems to be in regular rhythm today, on Xarelto, monitoring hemoglobin today. Charcot-Marie-Tooth: The patient made an appointment to be seen at [facility] by a specialized team for CMT Preventive care: Rec Tdap. RTC 4 months CPX.     This visit occurred during the SARS-CoV-2 public health emergency.  Safety protocols were in place, including screening questions prior to the visit, additional usage of staff PPE, and extensive cleaning of exam room while observing appropriate  contact time as indicated for disinfecting solutions.

## 2020-11-09 LAB — CBC WITH DIFFERENTIAL/PLATELET
Absolute Monocytes: 482 cells/uL (ref 200–950)
Basophils Absolute: 58 cells/uL (ref 0–200)
Basophils Relative: 0.8 %
Eosinophils Absolute: 212 cells/uL (ref 15–500)
Eosinophils Relative: 2.9 %
HCT: 45.1 % (ref 38.5–50.0)
Hemoglobin: 14.7 g/dL (ref 13.2–17.1)
Lymphs Abs: 2073 cells/uL (ref 850–3900)
MCH: 26.5 pg — ABNORMAL LOW (ref 27.0–33.0)
MCHC: 32.6 g/dL (ref 32.0–36.0)
MCV: 81.3 fL (ref 80.0–100.0)
MPV: 9 fL (ref 7.5–12.5)
Monocytes Relative: 6.6 %
Neutro Abs: 4475 cells/uL (ref 1500–7800)
Neutrophils Relative %: 61.3 %
Platelets: 249 10*3/uL (ref 140–400)
RBC: 5.55 10*6/uL (ref 4.20–5.80)
RDW: 14.9 % (ref 11.0–15.0)
Total Lymphocyte: 28.4 %
WBC: 7.3 10*3/uL (ref 3.8–10.8)

## 2020-11-09 LAB — BASIC METABOLIC PANEL
BUN: 13 mg/dL (ref 7–25)
CO2: 24 mmol/L (ref 20–32)
Calcium: 10 mg/dL (ref 8.6–10.3)
Chloride: 104 mmol/L (ref 98–110)
Creat: 0.86 mg/dL (ref 0.70–1.18)
Glucose, Bld: 110 mg/dL — ABNORMAL HIGH (ref 65–99)
Potassium: 4.8 mmol/L (ref 3.5–5.3)
Sodium: 140 mmol/L (ref 135–146)

## 2020-11-09 LAB — B12 AND FOLATE PANEL
Folate: 20.2 ng/mL
Vitamin B-12: 484 pg/mL (ref 200–1100)

## 2020-11-09 LAB — VITAMIN D 25 HYDROXY (VIT D DEFICIENCY, FRACTURES): Vit D, 25-Hydroxy: 29 ng/mL — ABNORMAL LOW (ref 30–100)

## 2020-11-09 NOTE — Assessment & Plan Note (Signed)
Fatigue: This is his ROV but his CC  is fatigue as described above. This is likely multifactorial however untreated sleep apnea is probably playing the biggest role. Although his PHQ-9 is 12, he denies any persistent depression and he seems to be doing well emotionally today. Last TSH 03-2020 okay. Plan: General labs, see next, strongly advised him to consider reassessment for sleep apnea as there are new treatment modalities different from CPAP. OSA: See above HTN: BP today is very good, recommend to check ambulatory BPs to be sure that he is not overtreated.  Currently on metoprolol.  Check a BMP and CBC. Chronic pain, pain management: Has decreased the consumption of hydrocodone significantly, takes it prn only. Anxiety depression: Seems to be controlled on fluoxetine and Xanax, see comments above. H/o vitamin D and B12 deficiency: Only recently started to take B12 supplements, checking levels. Paroxysmal A. fib: He seems to be in regular rhythm today, on Xarelto, monitoring hemoglobin today. Charcot-Marie-Tooth: The patient made an appointment to be seen at [facility] by a specialized team for CMT Preventive care: Rec Tdap. RTC 4 months CPX.

## 2020-11-11 MED ORDER — VITAMIN D (ERGOCALCIFEROL) 1.25 MG (50000 UNIT) PO CAPS: 50000.0000 [IU] | ORAL_CAPSULE | ORAL | 0 refills | Status: DC

## 2020-11-11 NOTE — Addendum Note (Signed)
Addended byDamita Dunnings D on: 11/11/2020 01:18 PM   Modules accepted: Orders

## 2020-11-12 ENCOUNTER — Other Ambulatory Visit: Payer: Self-pay | Admitting: Internal Medicine

## 2020-11-12 DIAGNOSIS — F341 Dysthymic disorder: Secondary | ICD-10-CM

## 2020-11-20 ENCOUNTER — Telehealth: Payer: Self-pay | Admitting: Internal Medicine

## 2020-11-20 DIAGNOSIS — F341 Dysthymic disorder: Secondary | ICD-10-CM

## 2020-11-21 NOTE — Telephone Encounter (Signed)
Requesting: alprazolam 0.5mg  Contract: 08/27/2020 UDS: 08/27/2020 Last Visit: 11/08/2020 Next Visit: 03/10/2021 Last Refill: 08/27/2020 #180 and 0RF Pt sig: 1 tab bid prn  Please Advise

## 2020-11-21 NOTE — Telephone Encounter (Signed)
Unable to check PDMP, Rx sent

## 2020-11-28 ENCOUNTER — Telehealth: Payer: Self-pay | Admitting: Internal Medicine

## 2020-11-28 NOTE — Telephone Encounter (Signed)
Attempted to schedule AWV. Unable to LVM.  Will try at later time.  Line busy

## 2020-12-04 ENCOUNTER — Telehealth: Payer: Self-pay | Admitting: Pharmacist

## 2020-12-04 ENCOUNTER — Ambulatory Visit (INDEPENDENT_AMBULATORY_CARE_PROVIDER_SITE_OTHER): Payer: Medicare Other | Admitting: Pharmacist

## 2020-12-04 DIAGNOSIS — R350 Frequency of micturition: Secondary | ICD-10-CM

## 2020-12-04 DIAGNOSIS — I10 Essential (primary) hypertension: Secondary | ICD-10-CM | POA: Diagnosis not present

## 2020-12-04 DIAGNOSIS — E785 Hyperlipidemia, unspecified: Secondary | ICD-10-CM

## 2020-12-04 DIAGNOSIS — N401 Enlarged prostate with lower urinary tract symptoms: Secondary | ICD-10-CM

## 2020-12-04 DIAGNOSIS — I25118 Atherosclerotic heart disease of native coronary artery with other forms of angina pectoris: Secondary | ICD-10-CM | POA: Diagnosis not present

## 2020-12-04 DIAGNOSIS — F341 Dysthymic disorder: Secondary | ICD-10-CM

## 2020-12-04 NOTE — Telephone Encounter (Signed)
Patient states he had requested updated Rx for solifenacin from Dr Dahlsteadt's office but was never sent in. Called nursing line with [facility] and Rx for 90 DS for solifenacin was sent to [facility] mail order in May 2022. Patient however has [place] and has to use [facility] order. New rx will be sent to [facility] per [facility] nurse. Patient aware.

## 2020-12-05 NOTE — Chronic Care Management (AMB) (Signed)
Chronic Care Management Pharmacy Note  12/05/2020 Name:  Brendan [name] MRN:  [id number] DOB:  1950/06/15  Subjective: Brendan [name] is an 70 y.o. year old male who is a primary patient of Paz, Alda Berthold, MD.  The CCM team was consulted for assistance with disease management and care coordination needs.    Engaged with patient by telephone for follow up visit in response to provider referral for pharmacy case management and/or care coordination services.   Consent to Services:  The patient was given information about Chronic Care Management services, agreed to services, and gave verbal consent prior to initiation of services.  Please see initial visit note for detailed documentation.   Patient Care Team: Colon Branch, MD as PCP - General (Internal Medicine) Rigoberto Noel, MD as Consulting Physician (Pulmonary Disease) Franchot Gallo, MD as Consulting Physician (Urology) Ladene Artist, MD as Consulting Physician (Gastroenterology) Stanford Breed Denice Bors, MD as Consulting Physician (Cardiology) Shawnie Dapper, DO as Consulting Physician (Optometry) Cherre Robins, PharmD (Pharmacist)  Recent office visits: 11/08/2020 - PCP (Dr Larose Kells) Seen for routine F/U. Changed metoprolol tartrate to metoprolol succinate ER 54m once a day. Added ergocalciferol 50,000 units weekly. also recommended reassessment of sleep apnea due to fatigue.   Recent consult visits: 10/16/20 [facility]opedics ([facility] F/U right reverse total shoulder arthroplasty and biceps tenodesis (surgery 07/11/2020) No med changes.  08/21/20  Orthopedics (Dr BSmitty Cords- [facility]) F/u shoulder replacement. No med changes.  Hospital visits: 07/11/2020 - surgery on right shoulder  Objective:  Lab Results  Component Value Date   CREATININE 0.86 11/08/2020   CREATININE 0.9 06/13/2020   CREATININE 0.71 04/03/2020    Lab Results  Component Value Date   HGBA1C 6.4 08/07/2020   Last diabetic Eye exam:  Lab Results   Component Value Date/Time   HMDIABEYEEXA No Retinopathy 09/18/2020 12:00 AM    Last diabetic Foot exam: No results found for: HMDIABFOOTEX      Component Value Date/Time   CHOL 136 01/22/2020 1028   CHOL 128 12/14/2018 1120   TRIG 154.0 (H) 01/22/2020 1028   HDL 35.00 (L) 01/22/2020 1028   HDL 29 (L) 12/14/2018 1120   CHOLHDL 4 01/22/2020 1028   VLDL 30.8 01/22/2020 1028   LDLCALC 70 01/22/2020 1028   LDLCALC 60 12/14/2018 1120   LDLDIRECT 109.0 07/12/2017 1104    Hepatic Function Latest Ref Rng & Units 06/13/2020 04/03/2020 04/01/2020  Total Protein 6.1 - 8.1 g/dL - 6.1 6.4(L)  Albumin 3.5 - 5.0 g/dL - - 3.9  AST 14 - 40 _0 ALT 10 - 40 _1 Alk Phosphatase 25 - 125 89 - 55  Total Bilirubin 0.2 - 1.2 mg/dL - 0.6 0.6  Bilirubin, Direct <=0.2 mg/dL - - -    Lab Results  Component Value Date/Time   TSH 1.47 04/03/2020 03:52 PM   TSH 1.44 08/17/2018 10:38 AM    CBC Latest Ref Rng & Units 11/08/2020 08/07/2020 06/13/2020  WBC 3.8 - 10.8 Thousand/uL 7.3 5.0 6.7  Hemoglobin 13.2 - 17.1 g/dL 14.7 13.2 14.7  Hematocrit 38.5 - 50.0 % 45.1 40.0 46  Platelets 140 - 400 Thousand/uL 249 296.0 225    Lab Results  Component Value Date/Time   VD25OH 29 (L) 11/08/2020 02:02 PM   VD25OH 54.29 12/25/2013 10:23 AM   VD25OH 36 02/12/2011 11:03 AM    Clinical ASCVD: Yes  The 10-year ASCVD risk score (Mikey BussingDC JBrooke Bonito, et  al., 2013) is: 40.6%   Values used to calculate the score:     Age: 42 years     Sex: Male     Is Non-Hispanic African American: No     Diabetic: Yes     Tobacco smoker: Yes     Systolic Blood Pressure: 761 mmHg     Is BP treated: Yes     HDL Cholesterol: 35 mg/dL     Total Cholesterol: 136 mg/dL    Other: (CHADS2VASc if Afib, PHQ9 if depression, MMRC or CAT for COPD, ACT, DEXA)  Social History   Tobacco Use  Smoking Status Every Day   Packs/day: 0.50   Years: 51.00   Pack years: 25.50   Types: Cigarettes   Start date: 1970  Smokeless Tobacco Never   Tobacco Comments   Still smoking max of #15/day   BP Readings from Last 3 Encounters:  11/08/20 122/74  08/07/20 120/77  04/18/20 124/74   Pulse Readings from Last 3 Encounters:  11/08/20 64  08/07/20 63  04/18/20 66   Wt Readings from Last 3 Encounters:  11/08/20 279 lb 2 oz (126.6 kg)  08/07/20 277 lb (125.6 kg)  05/24/20 286 lb (129.7 kg)    Assessment: Review of patient past medical history, allergies, medications, health status, including review of consultants reports, laboratory and other test data, was performed as part of comprehensive evaluation and provision of chronic care management services.   SDOH:  (Social Determinants of Health) assessments and interventions performed:  SDOH Interventions    Flowsheet Row Most Recent Value  SDOH Interventions   Financial Strain Interventions Intervention Not Indicated       CCM Care Plan  Allergies  Allergen Reactions   Tussionex Pennkinetic Er [Hydrocod Polst-Cpm Polst Er] Hives    No current issues, only had issues when he was 70 years old   Albuterol Palpitations   Qvar [Beclomethasone] Palpitations    Medications Reviewed Today     Reviewed by Cherre Robins, PharmD (Pharmacist) on 12/04/20 at 1232  Med List Status: <None>   Medication Order Taking? Sig Documenting Provider Last Dose Status Informant  acetaminophen (TYLENOL) 500 MG tablet [id number] Yes Take 500 mg by mouth every 6 (six) hours as needed. [provider] Taking Active   ALPRAZolam Duanne Moron) 0.5 MG tablet [id number] Yes TAKE 1 TABLET BY MOUTH  TWICE DAILY AS NEEDED FOR  ANXIETY Colon Branch, MD Taking Active   atorvastatin (LIPITOR) 80 MG tablet [id number] Yes Take 1 tablet (80 mg total) by mouth daily. Colon Branch, MD Taking Active   chlorpheniramine (CHLOR-TRIMETON) 4 MG tablet [id number] Yes Take 8 mg by mouth daily as needed for allergies or rhinitis.  [provider] Taking Active   ezetimibe (ZETIA) 10 MG tablet [id number] Yes Take  1 tablet (10 mg total) by mouth daily. Colon Branch, MD Taking Active   FLUoxetine (PROZAC) 20 MG capsule [id number] Yes Take 1 capsule (20 mg total) by mouth daily. Colon Branch, MD Taking Active   HYDROcodone-acetaminophen (NORCO/VICODIN) 5-325 MG tablet [id number] Yes Take 1 tablet by mouth at bedtime.  Patient taking differently: Take 1 tablet by mouth at bedtime as needed.   Colon Branch, MD Taking Active   methocarbamol (ROBAXIN) 500 MG tablet [id number] Yes Take 1 tablet (500 mg total) by mouth at bedtime as needed for muscle spasms. Colon Branch, MD Taking Active   metoprolol succinate (TOPROL-XL) 50 MG 24 hr tablet [id number] Yes Take 75 mg by  mouth daily. Take with or immediately following a meal. [provider] Taking Active   Multiple Vitamin (MULTIVITAMIN) tablet [id number] Yes Take 1 tablet by mouth daily. Calcium, Magnesium, Zinc [provider] Taking Active   pantoprazole (PROTONIX) 40 MG tablet [id number] Yes Take 1 tablet (40 mg total) by mouth 2 (two) times daily. Ladene Artist, MD Taking Active   rivaroxaban (XARELTO) 20 MG TABS tablet [id number] Yes Take 1 tablet (20 mg total) by mouth daily with supper. Colon Branch, MD Taking Active   sildenafil (VIAGRA) 100 MG tablet [id number] Yes Take 100 mg by mouth daily as needed for erectile dysfunction. [provider] Taking Active   solifenacin (VESICARE) 10 MG tablet [id number] Yes Take 1 tablet (10 mg total) by mouth daily. Franchot Gallo, MD Taking Active   Vitamin D, Ergocalciferol, (DRISDOL) 1.25 MG (50000 UNIT) CAPS capsule [id number] Yes Take 1 capsule (50,000 Units total) by mouth every 7 (seven) days. Colon Branch, MD Taking Active             Patient Active Problem List   Diagnosis Date Noted   Insomnia 08/08/2020   Nonintractable episodic headache 12/14/2019   Controlled diabetes mellitus type 2 with complications ([place]) 94/70/9628   COPD mixed type ([place]) 04/18/2018   OSA (obstructive sleep  apnea) 10/16/2015   Bruit 04/24/2015   PCP NOTES >>>>>> 02/25/2015   Hx of colonic polyps 06/25/2014   Sinus bradycardia 06/19/2014   Annual physical exam >>>>>>>>>>>>>>>>>> 04/28/2014   Ejection fraction    GERD (gastroesophageal reflux disease) 02/11/2011   Paroxysmal atrial flutter ([place])    HTN (hypertension)    CAD (coronary artery disease)    Dyslipidemia    Obesity    B12 deficiency    VITAMIN D DEFICIENCY 05/20/2009   Polyneuropathy 09/10/2008   BACK PAIN, LUMBAR--UDS 08/03/2008   ERECTILE DYSFUNCTION 02/09/2008   BPH (benign prostatic hyperplasia) 02/09/2008   COLONIC POLYPS 07/23/2007   CIGARETTE SMOKER 07/23/2007   Solitary pulmonary nodule-chest x-ray stable 08-2014, no further schedule imaging 07/23/2007   DIVERTICULOSIS OF COLON 07/23/2007   ANXIETY DEPRESSION 07/08/2007    Immunization History  Administered Date(s) Administered   Fluad Quad(high Dose 65+) 02/10/2019   Influenza Split 06/05/2012, 06/20/2013, 04/10/2014   Influenza Whole 04/08/2009, 04/22/2010, 02/11/2011   Influenza, High Dose Seasonal PF 03/02/2016   Influenza-Unspecified 03/28/2015, 03/17/2017, 03/10/2018, 03/14/2020   PFIZER(Purple Top)SARS-COV-2 Vaccination 06/29/2019, 07/19/2019, 03/14/2020   Pneumococcal Conjugate-13 04/27/2014   Pneumococcal Polysaccharide-23 05/19/2010, 01/22/2020   Td 05/19/2010   Zoster Recombinat (Shingrix) 04/06/2019, 06/18/2019   Zoster, Live 12/25/2013    Conditions to be addressed/monitored: Atrial Fibrillation, CAD, HTN, HLD, Anxiety, and neuropathy; GERD; OSA; vitamin B12 and D deficiencies; BPH with LUTS  Care Plan : General Pharmacy (Adult)  Updates made by Cherre Robins, PHARMD since 12/05/2020 12:00 AM     Problem: Tobacco use, Depression, Pain, GERD   Priority: High  Onset Date: 08/01/2020     Long-Range Goal: Patient-Specific Goal   Start Date: 08/01/2020  Expected End Date: 01/29/2021  Recent Progress: On track  Priority: High  Note:   Current  Barriers:  Unable to maintain smoking cessation. Has not been able to get refills for solifenacin / Vesicare Changed from [facility] mail order to [facility] order in 2022.   Pharmacist Clinical Goal(s):  Over the next 120 days, patient will achieve adherence to monitoring guidelines and medication adherence to achieve therapeutic efficacy contact provider office for questions/concerns as evidenced notation of  same in electronic health record Work towards smoking cessation by NRT and support. through collaboration with PharmD and provider.   Interventions: 1:1 collaboration with Colon Branch, MD regarding development and update of comprehensive plan of care as evidenced by provider attestation and co-signature Inter-disciplinary care team collaboration (see longitudinal plan of care) Comprehensive medication review performed; medication list updated in electronic medical record  Depression/Anxiety (Goal: Minimize symptoms) Controlled Current treatment: Alprazolam 0.5 mg BID PRN  Fluoxetine 20 mg daily AM Medications previously tried/failed: none noted Reports stable mood; Last PHQ9 score was 12 but patient states he is not depressed.  Interventions:  Educated on Benefits of medication for symptom control Recommended to continue current medication  Tobacco use (Goal: Smoking cessation) Not controlled Current treatment  None at this time Patient smokes first cigarette about 60 minutes of waking Patient triggers include: drinking alcohol and coffee On a scale of 1-10, reports MOTIVATION to quit is 6 - 7 On a scale of 1-10, reports CONFIDENCE in quitting is 8-9 Previous quit attempts: Tried back in October and was successful for a few weeks, then health issues caused him to want to smoke again He still has patches and gum from [place] line  Plans to try to quit again just needs to find the motivation Currently smokes about 10 cigs per day Interventions:  None today - addressed at  previous appointments and will readdress in future.  Recommended patient continue to work at smoking cessation, reach out for guidance and support   GERD (Goal: minimize symptoms) Controlled; denies any breakthrough reflux symptoms Current treatment  Pantoprazole 3m twice daily Medications previously tried: none noted  Denies any blood in stool, gastritis resolved. Interventions:  Recommended to continue current medication  Pain (Goal: Minimize symptoms) Improving since shoulder surgery in early 2022 He continues to have neuropathy [facility] Charcot-Marie-Tooth neuropathy Current treatment  Methocarbamol 5054mat bedtime as needed Hydrocodone-APAP 5-235 mg at bedtime as needed (using very rarely)  B12 supplement 100041mdaily(recently started) Medications previously tried: etodolac (concerns with Xarelto / increased bleeding); soma (in effective) Interventions:  Continue to use pain medication sparingly Continue exercises from PT for shoulder strength [facility]: Managed by urology - Dr DahLuberta Robertsonrrent treatment:  Solifenacin (Vesicare) 9m28mily Past therapies: oxybutynin (not effective) Patient reports some improved urinary symptoms with solifenacin but also reported dry mouth when he spoke with CMA on 11/01/2020.  Patient reports he has been out of solifenacin for about 1 week - has not been able to get refill and requested several weeks ago from urology office.  We discussed that dry mouth could be from solifenacin but patient feels that he had dry mouth prior to starting and has not changed since not having solifenacin for the last week.  Interventions:  Coordinated with Urology office to get solifenacin [facility] ([facility] side effects to monitor for from solifenacin Continue current therapy - contact either pharmacist or urology office if  dry mouth is not tolerable.  Medication Management:  Reviewed refill history in Epic and was concerned that only [employer]'s fills from 2021 were showing up.  Patient endorsed that he is taking all his meds except solifenacin which he is having trouble getting refills (see above)  Contacted [facility] and verified that patient had filled all maintenance medications for 90 day supply within the last 60 days.  Will continue to follow adherence.   Patient Goals/Self-Care Activities Over the next 120 days, patient will:  take medications as prescribed reach out to provider with increased or changing symptoms of pain.  Reach out for support in smoking cessation  Follow Up Plan: The care management team will reach out to the patient again over the next 90 days. Clinical pharmacist appt in 3 to 4 months.       Medication Assistance:  None needed; Seems patient has partial / full LIS for 2022. Per [facility] his coapy for Xarelto has been $9.85  Patient's preferred pharmacy is:  [place], [place] - [address] [place] [address] Phone: [phone] Fax: [phone]  [place], [place] [address] [place] [address] Phone: [phone] Fax: [phone]  [facility]  ([place] - [place], [place] [address] [place] [address] Phone: [phone] Fax: [phone]   Follow Up:  Patient agrees to Care Plan and Follow-up.  Plan: Telephone follow up appointment with care management team member scheduled for:  CMA will check in by phone in 90 days; Clinical pharmacist appt in 3 to 4 months.   Cherre Robins, PharmD Clinical Pharmacist [facility] [phone]

## 2020-12-05 NOTE — Patient Instructions (Signed)
Visit Information  PATIENT GOALS:  Goals Addressed             This Visit's Progress    Chronic Care Management Pharmacy Care Plan   On track    CARE PLAN ENTRY (see longitudinal plan of care for additional care plan information)  Current Barriers:  Chronic Disease Management support, education, and care coordination needs related to Aflutter, Pre-Diabetes, COPD, Tobacco Use Disorder, Hypertension, Hyperlipidemia/CAD, Depression/Anxiety, Overactive Bladder, GERD, Chronic Pain   Hypertension BP Readings from Last 3 Encounters:  11/08/20 122/74  08/07/20 120/77  04/18/20 124/74  Pharmacist Clinical Goal(s): Over the next 90 days, patient will work with PharmD and providers to maintain BP goal <140/90 Current regimen:  Metoprolol XL 50 mg 1.5 tabs daily  Patient self care activities - Over the next 90 days, patient will: Maintain hypertension medication regimen.  Hyperlipidemia Lab Results  Component Value Date/Time   LDLCALC 70 01/22/2020 10:28 AM   LDLCALC 60 12/14/2018 11:20 AM   LDLDIRECT 109.0 07/12/2017 11:04 AM  Pharmacist Clinical Goal(s): Over the next 90 days, patient will work with PharmD and providers to maintain LDL goal < 70 Current regimen:  Atorvastatin 80 mg daily Ezetimibe 10 mg daily  Interventions: Discussed LDL goal Reviewed fill history and discussed adherence Patient self care activities - Over the next 90 days, patient will: Maintain cholesterol medication regimen.   Diabetes Lab Results  Component Value Date/Time   HGBA1C 6.4 08/07/2020 02:34 PM   HGBA1C 6.2 01/22/2020 10:28 AM  Pharmacist Clinical Goal(s): Over the next 90 days, patient will work with PharmD and providers to maintain A1c goal <6.5% Current regimen:  Diet and exercise management   Interventions: Discussed how long term corticosteroid use could increase a1c Patient self care activities - Over the next 90 days, patient will: Maintain a1c less than 6.5%  GERD Pharmacist  Clinical Goal(s) Over the next 90 days, patient will work with PharmD and providers to reduce symptoms of GERD Current regimen:  Pantoprazole 40mg  twice daily Patient self care activities - Over the next 90 days, patient will: Continue current medication  Depression/Anxiety Pharmacist Clinical Goal(s) Over the next 90 days, patient will work with PharmD and providers to reduce symptoms of depression/anxiety Current regimen:  Alprazolam 0.5 mg twice a day if needed for anxiety Fluoxetine 20 mg daily in the morning Patient self care activities - Over the next 90 days, patient will: Maintain depression/anxiety medication regimen.    Tobacco Use Disorder Pharmacist Clinical Goal(s) Over the next 90 days, patient will work with PharmD and providers to stop smoking Current regimen:  none Patient self care activities - Over the next 90 days, patient will: Try to decrease number of cigarettes smoked per day Contact provider or pharmacist if you would like assistance to stop smoking.   Health Maintenance  Pharmacist Clinical Goal(s) Over the next 180 days, patient will work with PharmD and providers to complete health maintenance screenings/vaccinations Interventions: Reviewed immunization records - due Tdap and COVID booster  Patient self care activities - Over the next 180 days, patient will: Get Tetanus booster Get COVID booster   Pain  Pharmacist Clinical Goal(s) Minimize symptoms Current treatment  Methocarbamol 500mg  at bedtime as needed Hydrocodone-APAP 5-235 mg at bedtime as needed (using very rarely)  B12 supplement 102mcg daily(recently started) Patient self care activities - Over the next 180 days, patient will: Continue to use pain medication sparingly Continue exercises from PT for shoulder strength Keep appt with physicians at [facility]  BPH with LUTS: Pharmacist Clinical Goal(s) Minimize urinary urgency and frequency Current treatment  Solifenacin (Vesicare) 10mg   daily Interventions:  Coordinated with Urology office to get solifenacin sent to [facility] (previous Rx was sent to [facility] [place]) Discussed side effects to monitor for from solifenacin Patient self care activities - Over the next 180 days, patient will: Continue current therapy - contact either pharmacist or urology office if dry mouth is not tolerable.  Medication management Pharmacist Clinical Goal(s): Over the next 90 days, patient will work with PharmD and providers to maintain optimal medication adherence Current pharmacy:  [facility] Interventions Comprehensive medication review performed. Continue current medication management strategy Pharmacist contacted [facility] to make sure they had all your maintenance medications on file now.  Patient self care activities - Over the next 90 days, patient will: Focus on medication adherence by filling and taking medications appropriately  Take medications as prescribed Report any questions or concerns to PharmD and/or provider(s)  Please see past updates related to this goal by clicking on the "Past Updates" button in the selected goal           Patient verbalizes understanding of instructions provided today and agrees to view in [place].   Telephone follow up appointment with care management team member scheduled for: CMA will contact by phone in next 90 days; Clinical pharmacist in 3 to 4 months.  Cherre Robins, PharmD Clinical Pharmacist [facility] [phone]

## 2020-12-12 ENCOUNTER — Other Ambulatory Visit: Payer: Self-pay

## 2020-12-12 MED ORDER — METOPROLOL SUCCINATE ER 50 MG PO TB24: 75.0000 mg | ORAL_TABLET | Freq: Every day | ORAL | 1 refills | Status: DC

## 2020-12-17 ENCOUNTER — Other Ambulatory Visit: Payer: Self-pay | Admitting: Internal Medicine

## 2020-12-17 DIAGNOSIS — E785 Hyperlipidemia, unspecified: Secondary | ICD-10-CM

## 2021-01-17 DIAGNOSIS — Z96611 Presence of right artificial shoulder joint: Secondary | ICD-10-CM | POA: Diagnosis not present

## 2021-01-17 DIAGNOSIS — R29898 Other symptoms and signs involving the musculoskeletal system: Secondary | ICD-10-CM | POA: Diagnosis not present

## 2021-01-21 DIAGNOSIS — Z79891 Long term (current) use of opiate analgesic: Secondary | ICD-10-CM | POA: Diagnosis not present

## 2021-01-21 DIAGNOSIS — R5383 Other fatigue: Secondary | ICD-10-CM | POA: Diagnosis not present

## 2021-01-21 DIAGNOSIS — Z885 Allergy status to narcotic agent status: Secondary | ICD-10-CM | POA: Diagnosis not present

## 2021-01-21 DIAGNOSIS — Q667 Congenital pes cavus, unspecified foot: Secondary | ICD-10-CM | POA: Diagnosis not present

## 2021-01-21 DIAGNOSIS — G629 Polyneuropathy, unspecified: Secondary | ICD-10-CM | POA: Diagnosis not present

## 2021-01-21 DIAGNOSIS — M25519 Pain in unspecified shoulder: Secondary | ICD-10-CM | POA: Diagnosis not present

## 2021-01-21 DIAGNOSIS — H9190 Unspecified hearing loss, unspecified ear: Secondary | ICD-10-CM | POA: Diagnosis not present

## 2021-01-21 DIAGNOSIS — H9319 Tinnitus, unspecified ear: Secondary | ICD-10-CM | POA: Diagnosis not present

## 2021-01-21 DIAGNOSIS — R251 Tremor, unspecified: Secondary | ICD-10-CM | POA: Diagnosis not present

## 2021-01-21 DIAGNOSIS — F17210 Nicotine dependence, cigarettes, uncomplicated: Secondary | ICD-10-CM | POA: Diagnosis not present

## 2021-01-21 DIAGNOSIS — R202 Paresthesia of skin: Secondary | ICD-10-CM | POA: Diagnosis not present

## 2021-01-21 DIAGNOSIS — F32A Depression, unspecified: Secondary | ICD-10-CM | POA: Diagnosis not present

## 2021-01-21 DIAGNOSIS — R2681 Unsteadiness on feet: Secondary | ICD-10-CM | POA: Diagnosis not present

## 2021-01-21 DIAGNOSIS — Q6672 Congenital pes cavus, left foot: Secondary | ICD-10-CM | POA: Diagnosis not present

## 2021-01-21 DIAGNOSIS — Q6671 Congenital pes cavus, right foot: Secondary | ICD-10-CM | POA: Diagnosis not present

## 2021-01-21 DIAGNOSIS — R200 Anesthesia of skin: Secondary | ICD-10-CM | POA: Diagnosis not present

## 2021-01-21 DIAGNOSIS — R9389 Abnormal findings on diagnostic imaging of other specified body structures: Secondary | ICD-10-CM | POA: Diagnosis not present

## 2021-01-21 DIAGNOSIS — M545 Low back pain, unspecified: Secondary | ICD-10-CM | POA: Diagnosis not present

## 2021-01-21 DIAGNOSIS — I10 Essential (primary) hypertension: Secondary | ICD-10-CM | POA: Diagnosis not present

## 2021-01-21 DIAGNOSIS — I2510 Atherosclerotic heart disease of native coronary artery without angina pectoris: Secondary | ICD-10-CM | POA: Diagnosis not present

## 2021-01-21 DIAGNOSIS — R7303 Prediabetes: Secondary | ICD-10-CM | POA: Diagnosis not present

## 2021-01-21 DIAGNOSIS — R519 Headache, unspecified: Secondary | ICD-10-CM | POA: Diagnosis not present

## 2021-01-21 DIAGNOSIS — R29898 Other symptoms and signs involving the musculoskeletal system: Secondary | ICD-10-CM | POA: Diagnosis not present

## 2021-01-21 DIAGNOSIS — J449 Chronic obstructive pulmonary disease, unspecified: Secondary | ICD-10-CM | POA: Diagnosis not present

## 2021-01-21 DIAGNOSIS — Z888 Allergy status to other drugs, medicaments and biological substances status: Secondary | ICD-10-CM | POA: Diagnosis not present

## 2021-01-21 DIAGNOSIS — G4733 Obstructive sleep apnea (adult) (pediatric): Secondary | ICD-10-CM | POA: Diagnosis not present

## 2021-01-23 ENCOUNTER — Other Ambulatory Visit: Payer: Self-pay | Admitting: Internal Medicine

## 2021-01-23 DIAGNOSIS — F341 Dysthymic disorder: Secondary | ICD-10-CM

## 2021-01-24 ENCOUNTER — Telehealth: Payer: Self-pay

## 2021-01-24 NOTE — Progress Notes (Addendum)
Chronic Care Management Pharmacy Assistant   Name: Brendan [name]  MRN: [id number] DOB: Apr 30, 1951   Reason for Encounter: Disease State General Assessment     Recent office visits:  None noted.   Recent consult visits:  01/17/21 Brendan [name] - Orthopedics - Weakness in both lower extremities  01/21/21 Brendan [name] visits:  None in previous 6 months  Medications: Outpatient Encounter Medications as of 01/24/2021  Medication Sig   acetaminophen (TYLENOL) 500 MG tablet Take 500 mg by mouth every 6 (six) hours as needed.   ALPRAZolam (XANAX) 0.5 MG tablet TAKE 1 TABLET BY MOUTH  TWICE DAILY AS NEEDED FOR  ANXIETY   atorvastatin (LIPITOR) 80 MG tablet Take 1 tablet (80 mg total) by mouth daily.   chlorpheniramine (CHLOR-TRIMETON) 4 MG tablet Take 8 mg by mouth daily as needed for allergies or rhinitis.    ezetimibe (ZETIA) 10 MG tablet Take 1 tablet (10 mg total) by mouth daily.   FLUoxetine (PROZAC) 20 MG capsule Take 1 capsule (20 mg total) by mouth daily.   HYDROcodone-acetaminophen (NORCO/VICODIN) 5-325 MG tablet Take 1 tablet by mouth at bedtime. (Patient taking differently: Take 1 tablet by mouth at bedtime as needed.)   methocarbamol (ROBAXIN) 500 MG tablet Take 1 tablet (500 mg total) by mouth at bedtime as needed for muscle spasms.   metoprolol succinate (TOPROL-XL) 50 MG 24 hr tablet Take 1.5 tablets (75 mg total) by mouth daily. Take with or immediately following a meal.   Multiple Vitamin (MULTIVITAMIN) tablet Take 1 tablet by mouth daily. Calcium, Magnesium, Zinc   pantoprazole (PROTONIX) 40 MG tablet Take 1 tablet (40 mg total) by mouth 2 (two) times daily.   rivaroxaban (XARELTO) 20 MG TABS tablet Take 1 tablet (20 mg total) by mouth daily with supper.   sildenafil (VIAGRA) 100 MG tablet Take 100 mg by mouth daily as needed for erectile dysfunction.   solifenacin (VESICARE) 10 MG tablet Take 1 tablet (10 mg total) by mouth  daily.   Vitamin D, Ergocalciferol, (DRISDOL) 1.25 MG (50000 UNIT) CAPS capsule Take 1 capsule (50,000 Units total) by mouth every 7 (seven) days.   No facility-administered encounter medications on file as of 01/24/2021.   Have you had any problems recently with your health? Patient states that he has been seeing doctors regularly.  M Have you had any problems with your pharmacy? N/a  What issues or side effects are you having with your medications? Patient states that he would like to discuss whether or not he should remove Statin and pantprozole due to possible CMT (He is still waiting for lab results as of 01/27/21).  What would you like me to pass along to [employer], CPP for them to help you with?  Per patient he is awaiting lab results for his CMT that his will discuss with his PCP.   What can we do to take care of you better?  N/a  Misc. Comments: Spoke with Mr. Tim very pleasant man. He states that he has been having some fatigue, headaches, and lethargy so he notified Dr. Larose Kells. Dr. Larose Kells referred him to a neurologist. The neurologist recently did some labs to check for CMT, Patient is still awaiting the lab results at this time. Patient also states that he has some trouble getting around and up from his chair starting about 3 weeks ago. He has a physical therapy appointment coming up and will  try to do those exercises at home as well. Patient is also taking Vitamin E, OTC B12, and multivitamin to help with his fatigue.    Star Rating Drugs: Atorvastatin 80 mg    Andee Poles, CMA

## 2021-01-27 ENCOUNTER — Telehealth: Payer: Self-pay | Admitting: Internal Medicine

## 2021-01-27 ENCOUNTER — Other Ambulatory Visit: Payer: Self-pay | Admitting: Internal Medicine

## 2021-01-27 ENCOUNTER — Encounter: Payer: Self-pay | Admitting: Internal Medicine

## 2021-01-27 DIAGNOSIS — F341 Dysthymic disorder: Secondary | ICD-10-CM

## 2021-01-27 NOTE — Telephone Encounter (Signed)
Medication: ALPRAZolam (XANAX) 0.5 MG tablet   Has the patient contacted their pharmacy? Yes.   (If no, request that the patient contact the pharmacy for the refill.) (If yes, when and what did the pharmacy advise?)  Preferred Pharmacy (with phone number or street name):  [facility]  ([place]) - [place], [place]  [address] [place] [address]  Phone:  [phone]  Fax:  [phone]   Agent: Please be advised that RX refills may take up to 3 business days. We ask that you follow-up with your pharmacy.

## 2021-01-27 NOTE — Telephone Encounter (Signed)
Rx was sent in on 11/21/2020 for #180 tab (90 day supply)- Pt should have enough through 02/21/21. Will call pharmacy tomorrow to confirm amount of tablets sent.

## 2021-01-28 DIAGNOSIS — M6281 Muscle weakness (generalized): Secondary | ICD-10-CM | POA: Diagnosis not present

## 2021-01-28 DIAGNOSIS — R262 Difficulty in walking, not elsewhere classified: Secondary | ICD-10-CM | POA: Diagnosis not present

## 2021-01-28 DIAGNOSIS — I10 Essential (primary) hypertension: Secondary | ICD-10-CM | POA: Diagnosis not present

## 2021-01-28 NOTE — Telephone Encounter (Signed)
Opened in error

## 2021-01-28 NOTE — Telephone Encounter (Signed)
See mychart messages

## 2021-01-29 NOTE — Telephone Encounter (Signed)
CMA outreach - message sent to my in basket:  Last fill date Received: 01/29/2021 Brendan [name], [place], [place], PharmD Per your message [name] last fill date for atorvastatin was on 12/17/20 with a 90 Day supply. Please add 15 minutes to time spent calling [employer] in regards to last fill.

## 2021-01-30 LAB — HM DIABETES FOOT EXAM: HM Diabetic Foot Exam: NORMAL

## 2021-02-03 ENCOUNTER — Other Ambulatory Visit: Payer: Self-pay

## 2021-02-03 DIAGNOSIS — F17200 Nicotine dependence, unspecified, uncomplicated: Secondary | ICD-10-CM

## 2021-02-05 DIAGNOSIS — I10 Essential (primary) hypertension: Secondary | ICD-10-CM | POA: Diagnosis not present

## 2021-02-05 DIAGNOSIS — R262 Difficulty in walking, not elsewhere classified: Secondary | ICD-10-CM | POA: Diagnosis not present

## 2021-02-05 DIAGNOSIS — M6281 Muscle weakness (generalized): Secondary | ICD-10-CM | POA: Diagnosis not present

## 2021-02-25 ENCOUNTER — Telehealth: Payer: Self-pay | Admitting: Internal Medicine

## 2021-02-25 ENCOUNTER — Other Ambulatory Visit: Payer: Self-pay | Admitting: Internal Medicine

## 2021-02-25 DIAGNOSIS — F341 Dysthymic disorder: Secondary | ICD-10-CM

## 2021-02-25 NOTE — Telephone Encounter (Signed)
Requesting:alprazolam 0.5mg  Contract: 08/07/2020 UDS: 08/07/2020 Last Visit: 11/08/2020 Next Visit: 03/10/2021 Last Refill: 11/21/2020 #180 and 0RF Pt sig: 1 tab bid prn  Please Advise

## 2021-02-25 NOTE — Telephone Encounter (Signed)
Medication: ALPRAZolam (XANAX) 0.5 MG tablet   Has the patient contacted their pharmacy? No. (If no, request that the patient contact the pharmacy for the refill.) (If yes, when and what did the pharmacy advise?) Pharmacy wont fill unless Dr. Sends in  Preferred Pharmacy (with phone number or street name): [facility]  ([place], [place] [facility]  [address] [place] [address], [place] [address]  Phone:  [phone]   Agent: Please be advised that RX refills may take up to 3 business days. We ask that you follow-up with your pharmacy.

## 2021-02-25 NOTE — Telephone Encounter (Signed)
Duplicate request

## 2021-02-26 ENCOUNTER — Encounter: Payer: Self-pay | Admitting: Internal Medicine

## 2021-02-26 MED ORDER — ALPRAZOLAM 0.5 MG PO TABS: 0.5000 mg | ORAL_TABLET | Freq: Two times a day (BID) | ORAL | 1 refills | Status: DC | PRN

## 2021-02-26 NOTE — Telephone Encounter (Signed)
Pdmp ok, rx sent  ? ?

## 2021-03-04 ENCOUNTER — Other Ambulatory Visit: Payer: Self-pay | Admitting: *Deleted

## 2021-03-04 DIAGNOSIS — F17210 Nicotine dependence, cigarettes, uncomplicated: Secondary | ICD-10-CM

## 2021-03-04 DIAGNOSIS — Z87891 Personal history of nicotine dependence: Secondary | ICD-10-CM

## 2021-03-10 ENCOUNTER — Ambulatory Visit (INDEPENDENT_AMBULATORY_CARE_PROVIDER_SITE_OTHER): Payer: Medicare Other | Admitting: Internal Medicine

## 2021-03-10 ENCOUNTER — Encounter: Payer: Self-pay | Admitting: Internal Medicine

## 2021-03-10 ENCOUNTER — Other Ambulatory Visit: Payer: Self-pay

## 2021-03-10 VITALS — BP 128/64 | HR 61 | Temp 98.1°F | Resp 16 | Ht 78.0 in | Wt 278.5 lb

## 2021-03-10 DIAGNOSIS — E118 Type 2 diabetes mellitus with unspecified complications: Secondary | ICD-10-CM | POA: Diagnosis not present

## 2021-03-10 DIAGNOSIS — I10 Essential (primary) hypertension: Secondary | ICD-10-CM

## 2021-03-10 DIAGNOSIS — G629 Polyneuropathy, unspecified: Secondary | ICD-10-CM | POA: Diagnosis not present

## 2021-03-10 DIAGNOSIS — E559 Vitamin D deficiency, unspecified: Secondary | ICD-10-CM

## 2021-03-10 DIAGNOSIS — R350 Frequency of micturition: Secondary | ICD-10-CM

## 2021-03-10 DIAGNOSIS — N401 Enlarged prostate with lower urinary tract symptoms: Secondary | ICD-10-CM | POA: Diagnosis not present

## 2021-03-10 DIAGNOSIS — Z23 Encounter for immunization: Secondary | ICD-10-CM | POA: Diagnosis not present

## 2021-03-10 DIAGNOSIS — E785 Hyperlipidemia, unspecified: Secondary | ICD-10-CM

## 2021-03-10 LAB — PSA: PSA: 0.48 ng/mL (ref 0.10–4.00)

## 2021-03-10 LAB — LIPID PANEL
Cholesterol: 149 mg/dL (ref 0–200)
HDL: 36.9 mg/dL — ABNORMAL LOW (ref >39.00)
NonHDL: 112.21
Total CHOL/HDL Ratio: 4
Triglycerides: 262 mg/dL — ABNORMAL HIGH (ref 0.0–149.0)
VLDL: 52.4 mg/dL — ABNORMAL HIGH (ref 0.0–40.0)

## 2021-03-10 LAB — LDL CHOLESTEROL, DIRECT: Direct LDL: 88 mg/dL

## 2021-03-10 LAB — HEMOGLOBIN A1C: Hgb A1c MFr Bld: 7 % — ABNORMAL HIGH (ref 4.6–6.5)

## 2021-03-10 NOTE — Progress Notes (Signed)
Subjective:    Patient ID: Brendan [name], male    DOB: 1950-07-27, 70 y.o.   MRN: [id number]  DOS:  03/10/2021 Type of visit - description: ROV Routine follow-up. Today with talk about high cholesterol, peripheral neuropathy, fatigue, vitamins deficiency. Since the last visit is doing about the same. Note from neurology reviewed.   Review of Systems See above   Past Medical History:  Diagnosis Date   Allergy    Anxiety and depression    Arthritis    Atrial flutter [facility])    New diagnosis November 10, 2010, rate controlled   B12 deficiency    Blurred vision    episode with confusion, evaluated Dr. Erling Cruz   BPH (benign prostatic hypertrophy)    CAD (coronary artery disease)    stent, 2001 / nuclear, June, 2010, no ischemia   Diverticulosis of colon    Dyslipidemia    ED (erectile dysfunction)    Ejection fraction    GERD (gastroesophageal reflux disease)    pt denies, take omeprazole d/t taking several medications not GERD per pt   History of colonic polyps    HTN (hypertension)    Lumbar back pain    Obesity    OSA (obstructive sleep apnea)    PAF (paroxysmal atrial fibrillation) (HCC)    Peripheral neuropathy    lower extremities   Prediabetes    Pulmonary nodule    Scalp lesion    November, 2011   Sinus bradycardia    Sleep apnea    no c-pap   Stroke ([place])    tia   Tobacco use    hx of   Vitamin D deficiency     Past Surgical History:  Procedure Laterality Date   arm fracture surgery     remotely   cardica stent x2     CLAVICLE SURGERY     remotely   [place] Right 07/11/2020   TONSILLECTOMY      Allergies as of 03/10/2021       Reactions   Tussionex Pennkinetic Er [hydrocod Polst-cpm Polst Er] Hives   No current issues, only had issues when he was 70 years old   Albuterol Palpitations   Qvar [beclomethasone] Palpitations        Medication List        Accurate as of March 10, 2021 11:59 PM. If you have any  questions, ask your nurse or doctor.          STOP taking these medications    Vitamin D (Ergocalciferol) 1.25 MG (50000 UNIT) Caps capsule Commonly known as: DRISDOL Stopped by: [name], MD       TAKE these medications    acetaminophen 500 MG tablet Commonly known as: TYLENOL Take 500 mg by mouth every 6 (six) hours as needed.   ALPRAZolam 0.5 MG tablet Commonly known as: XANAX Take 1 tablet (0.5 mg total) by mouth 2 (two) times daily as needed. for anxiety   atorvastatin 80 MG tablet Commonly known as: LIPITOR Take 1 tablet (80 mg total) by mouth daily.   chlorpheniramine 4 MG tablet Commonly known as: CHLOR-TRIMETON Take 8 mg by mouth daily as needed for allergies or rhinitis.   ezetimibe 10 MG tablet Commonly known as: ZETIA Take 1 tablet (10 mg total) by mouth daily.   FLUoxetine 20 MG capsule Commonly known as: PROZAC Take 1 capsule (20 mg total) by mouth daily.   HYDROcodone-acetaminophen 5-325 MG tablet Commonly known as: NORCO/VICODIN Take 1  tablet by mouth at bedtime. What changed:  when to take this reasons to take this   methocarbamol 500 MG tablet Commonly known as: ROBAXIN Take 1 tablet (500 mg total) by mouth at bedtime as needed for muscle spasms.   metoprolol succinate 50 MG 24 hr tablet Commonly known as: TOPROL-XL Take 1.5 tablets (75 mg total) by mouth daily. Take with or immediately following a meal.   multivitamin tablet Take 1 tablet by mouth daily. Calcium, Magnesium, Zinc   pantoprazole 40 MG tablet Commonly known as: PROTONIX Take 1 tablet (40 mg total) by mouth 2 (two) times daily.   rivaroxaban 20 MG Tabs tablet Commonly known as: Xarelto Take 1 tablet (20 mg total) by mouth daily with supper.   sildenafil 100 MG tablet Commonly known as: VIAGRA Take 100 mg by mouth daily as needed for erectile dysfunction.   solifenacin 10 MG tablet Commonly known as: VESICARE Take 1 tablet (10 mg total) by mouth daily.            Objective:   Physical Exam BP 128/64 (BP Location: Left Arm, Patient Position: Sitting, Cuff Size: Normal)   Pulse 61   Temp 98.1 F (36.7 C) (Oral)   Resp 16   Ht 6\' 6"  (1.981 m)   Wt 278 lb 8 oz (126.3 kg)   SpO2 96%   BMI 32.18 kg/m  General:   Well developed, NAD, BMI noted. HEENT:  Normocephalic . Face symmetric, atraumatic Lungs:  CTA B Normal respiratory effort, no intercostal retractions, no accessory muscle use. Heart: RRR,  no murmur.  Lower extremities: no pretibial edema bilaterally  Skin: Not pale. Not jaundice Neurologic:  alert & oriented X3.  Speech normal, gait appropriate for age and unassisted Psych--  Cognition and judgment appear intact.  Cooperative with normal attention span and concentration.  Behavior appropriate. No anxious or depressed appearing.      Assessment      Assessment DM (A1C increased to 6.8 on 12/2017) Peripheral neuropathy (no pain, insensitive type) HTN Hyperlipidemia Anxiety depression -- on xanax  Chronic rhinitis:  Sees ENT CV: Dr Stanford Breed --CAD. Stress test okay 05-2015 -- Paroxysmal Atrial fibrillation --TIA --Abdominal bruit: No AAA. Ultrasound 05/2015 PULM:  OSA - + sleep study 03-2015 ,Cpap  intolerant   COPD: smoker, CT chest 12/2017 :changes noted   GU: BPH, Hypogonadism, ED. Sees urology B12 and vitamin D deficiency Pain mngmt: has back-shoulder  pain and neuropathy  Soma switch to methocarbamol 08-2020, on  Vicodin.  Etodolac DC 2016  per Dr Larose Kells d/t concerns regards anticoagulation. Hydrocodone dose needed to be increased Has seen Dr. Erling Cruz several times since 2010:w/u  included SPE, ACE, TSH, B12 (slightly low with a normal methylmalonic acid) 08-2008 had a lumbar spine MRI with multilevel spondylosis. Dr. 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 High  cholesterol: On atorvastatin and Zetia, check FLP, last LFTs normal Fatigue: See last visit, doing about the same. 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 Possible concomitant myopathic process given proximal/hip girdle weakness  Family history of early onset dementia - consideration for multisystem proteinopathy linking these disorders Plan:  -Genetic evaluation today for suspected hereditary neuropathy - [employer] comprehensive neuromuscular panel +/- comprehensive neuropathy panel -Other serum labs today: TSH, CK  -Encourage instituting regular exercise, as guided by PT -Educational resources provided, including medications to  avoid in hereditary neuropathies -No pain medication needs today Patient reports that the genetic testing was negative, those results are not available to me.  Symptoms are unchanged. Chronic pain, pain management: Current issue is left shoulder pain, to see Ortho soon, still takes Vicodin very infrequently Anxiety, depression: On fluoxetine, also on Xanax (takes daily).  He does not feel anxious or depressed at this point. B12 deficiency: Levels normal 11/2020 Vitamin D deficiency: Levels low 11/2020, had ergocalciferol, now on OTCs, recheck on RTC Preventive care: Prostate cancer screening: Due for PSA Tdap recommended Flu shot today COVID booster recommended Lung cancer screening: Order placed 03/04/2021 RTC 4 months CPX     This visit occurred during the SARS-CoV-2 public health emergency.  Safety protocols were in place, including screening questions prior to the visit, additional usage of staff PPE, and extensive cleaning of exam room while observing appropriate contact time as indicated for disinfecting solutions.

## 2021-03-10 NOTE — Patient Instructions (Addendum)
Recommend to proceed with the following vaccines at your pharmacy COVID #4 Tdap (tetanus)   GO TO THE LAB : Get the blood work     Brendan [name], Brendan [name] back for   physical exam in 4 months

## 2021-03-10 NOTE — Progress Notes (Signed)
Virtual Visit via Telephone Note  I connected with Brendan [name] on 03/10/21 at  1:00 PM EDT by telephone and verified that I am speaking with the correct person using two identifiers.  Location: Patient: Home  Provider: Working from Home   I discussed the limitations, risks, security and privacy concerns of performing an evaluation and management service by telephone and the availability of in person appointments. I also discussed with the patient that there may be a patient responsible charge related to this service. The patient expressed understanding and agreed to proceed.   Shared Decision Making Visit Lung Cancer Screening Program [phone])   Eligibility: Age 70 y.o. Pack Years Smoking History Calculation 50 (# packs/per year x # years smoked) Recent History of coughing up blood  no Unexplained weight loss? no ( >Than 15 pounds within the last 6 months ) Prior History Lung / other cancer no (Diagnosis within the last 5 years already requiring surveillance chest CT Scans). Smoking Status Current Smoker Former Smokers: Years since quit: NA  Quit Date: NA  Visit Components: Discussion included one or more decision making aids. yes Discussion included risk/benefits of screening. yes Discussion included potential follow up diagnostic testing for abnormal scans. yes Discussion included meaning and risk of over diagnosis. yes Discussion included meaning and risk of False Positives. yes Discussion included meaning of total radiation exposure. yes  Counseling Included: Importance of adherence to annual lung cancer LDCT screening. yes Impact of comorbidities on ability to participate in the program. yes Ability and willingness to under diagnostic treatment. yes  Smoking Cessation Counseling: Current Smokers:  Discussed importance of smoking cessation. yes Information about tobacco cessation classes and interventions provided to patient. yes Patient provided with "ticket" for  LDCT Scan. yes Symptomatic Patient. no  Counseling(Intermediate counseling: > three minutes) [address]406 Diagnosis Code: Tobacco Use Z72.0 Asymptomatic Patient no  Counseling  NA Former Smokers:  Discussed the importance of maintaining cigarette abstinence. yes Diagnosis Code: Personal History of Nicotine Dependence. I96.789 Information about tobacco cessation classes and interventions provided to patient. Yes Patient provided with "ticket" for LDCT Scan. yes Written Order for Lung Cancer Screening with LDCT placed in Epic. Yes (CT Chest Lung Cancer Screening Low Dose W/O CM) [id number] Z12.2-Screening of respiratory organs Z87.891-Personal history of nicotine dependence   I spent 25 minutes of face to face time with him discussing the risks and benefits of lung cancer screening. We viewed a power point together that explained in detail the above noted topics. We took the time to pause the power point at intervals to allow for questions to be asked and answered to ensure understanding. We discussed that he had taken the single most powerful action possible to decrease his risk of developing lung cancer when he quit smoking. I counseled him to remain smoke free, and to contact me if he ever had the desire to smoke again so that I can provide resources and tools to help support the effort to remain smoke free. We discussed the time and location of the scan, and that either  [name] RN or I will call with the results within  24-48 hours of receiving them. He has my card and contact information in the event he needs to speak with me, in addition to a copy of the power point we reviewed as a resource. He verbalized understanding of all of the above and had no further questions upon leaving the office.    I explained to the patient that there has  been a high incidence of coronary artery disease noted on these exams. I explained that this is a non-gated exam therefore degree or severity cannot be  determined. This patient is on statin therapy. I have asked the patient to follow-up with their PCP regarding any incidental finding of coronary artery disease and management with diet or medication as they feel is clinically indicated. The patient verbalized understanding of the above and had no further questions.  Darion Milewski D. Kenton Kingfisher, NP-C [facility] Personal contact information can be found on Amion  03/10/2021, 10:03 AM

## 2021-03-11 ENCOUNTER — Ambulatory Visit (INDEPENDENT_AMBULATORY_CARE_PROVIDER_SITE_OTHER): Payer: Medicare Other | Admitting: Acute Care

## 2021-03-11 ENCOUNTER — Ambulatory Visit (HOSPITAL_BASED_OUTPATIENT_CLINIC_OR_DEPARTMENT_OTHER)
Admission: RE | Admit: 2021-03-11 | Discharge: 2021-03-11 | Disposition: A | Discharge status: Home / Self Care | Payer: Medicare Other | Source: Ambulatory Visit | Attending: Acute Care | Admitting: Acute Care

## 2021-03-11 ENCOUNTER — Encounter: Payer: Self-pay | Admitting: Acute Care

## 2021-03-11 DIAGNOSIS — F17210 Nicotine dependence, cigarettes, uncomplicated: Secondary | ICD-10-CM

## 2021-03-11 DIAGNOSIS — Z87891 Personal history of nicotine dependence: Secondary | ICD-10-CM | POA: Diagnosis not present

## 2021-03-11 NOTE — Assessment & Plan Note (Signed)
High cholesterol: On atorvastatin and Zetia, check FLP, last LFTs normal Fatigue: See last visit, doing about the same. 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 Possible concomitant myopathic process given proximal/hip girdle weakness  Family history of early onset dementia - consideration for multisystem proteinopathy linking these disorders Plan:  -Genetic evaluation today for suspected hereditary neuropathy - [employer] comprehensive neuromuscular panel +/- comprehensive neuropathy panel -Other serum labs today: TSH, CK  -Encourage instituting regular exercise, as guided by PT -Educational resources provided, including medications to avoid in hereditary neuropathies -No pain medication needs today Patient reports that the genetic testing was negative, those results are not available to me.  Symptoms are unchanged. Chronic pain, pain management: Current issue is left shoulder pain, to see Ortho soon, still takes Vicodin very infrequently Anxiety, depression: On fluoxetine, also on Xanax (takes daily).  He does not feel anxious or depressed at this point. B12 deficiency: Levels normal 11/2020 Vitamin D deficiency: Levels low 11/2020, had ergocalciferol, now on OTCs, recheck on RTC Preventive care: Prostate cancer screening: Due for PSA Tdap recommended Flu shot today COVID booster recommended Lung cancer screening: Order placed 03/04/2021 RTC 4 months CPX

## 2021-03-12 ENCOUNTER — Encounter: Payer: Self-pay | Admitting: Internal Medicine

## 2021-03-12 MED ORDER — EMPAGLIFLOZIN 10 MG PO TABS: 10.0000 mg | ORAL_TABLET | Freq: Every day | ORAL | 1 refills | Status: DC

## 2021-03-12 NOTE — Addendum Note (Signed)
Addended by: Damita Dunnings D on: 03/12/2021 11:06 AM   Modules accepted: Orders

## 2021-03-19 ENCOUNTER — Other Ambulatory Visit: Payer: Self-pay | Admitting: Gastroenterology

## 2021-03-19 DIAGNOSIS — K229 Disease of esophagus, unspecified: Secondary | ICD-10-CM

## 2021-03-19 DIAGNOSIS — K2970 Gastritis, unspecified, without bleeding: Secondary | ICD-10-CM

## 2021-03-20 ENCOUNTER — Other Ambulatory Visit: Payer: Self-pay | Admitting: Internal Medicine

## 2021-03-26 ENCOUNTER — Other Ambulatory Visit: Payer: Self-pay | Admitting: Acute Care

## 2021-03-26 ENCOUNTER — Encounter: Payer: Self-pay | Admitting: *Deleted

## 2021-03-26 ENCOUNTER — Other Ambulatory Visit: Payer: Self-pay

## 2021-03-26 ENCOUNTER — Telehealth: Payer: Self-pay | Admitting: Gastroenterology

## 2021-03-26 DIAGNOSIS — K2970 Gastritis, unspecified, without bleeding: Secondary | ICD-10-CM

## 2021-03-26 DIAGNOSIS — K229 Disease of esophagus, unspecified: Secondary | ICD-10-CM

## 2021-03-26 DIAGNOSIS — F17210 Nicotine dependence, cigarettes, uncomplicated: Secondary | ICD-10-CM

## 2021-03-26 MED ORDER — PANTOPRAZOLE SODIUM 40 MG PO TBEC: 40.0000 mg | DELAYED_RELEASE_TABLET | Freq: Two times a day (BID) | ORAL | 0 refills | Status: DC

## 2021-03-26 NOTE — Telephone Encounter (Signed)
Informed patient that pantoprazole is a long-term medication since had gastritis at his last Upper Endoscopy. Patient states he thought he remembered Dr. Fuller Plan saying that but wanted to confirm. Prescription sent to patient's mail order pharmacy and yearly appt scheduled for 05/08/21.

## 2021-04-01 NOTE — Progress Notes (Signed)
HPI: FU atrial fibrillation and CAD. Cardiac catheterization performed in 2001 because of abnormal nuclear study showed a 70% LAD, 80% circumflex and 80% right coronary artery. LV function normal. The patient had PCI of his circumflex and right coronary artery. Event monitor January 2016 showed episode of coarse atrial fibrillation versus flutter. Abdominal ultrasound December 2016 showed no aneurysm. Echocardiogram October 2018 showed normal LV function and grade 1 diastolic dysfunction. CTA July 2021 showed occlusion of the right vertebral artery felt to be chronic.  Nuclear study December 2021 showed ejection fraction 62%, diaphragmatic attenuation but no ischemia.  Since last seen, he has some dyspnea on exertion that he attributes to deconditioning.  No orthopnea, PND, pedal edema, chest pain or syncope.  He will require left shoulder surgery in approximately 1 month.  Current Outpatient Medications  Medication Sig Dispense Refill   acetaminophen (TYLENOL) 500 MG tablet Take 500 mg by mouth every 6 (six) hours as needed.     ALPRAZolam (XANAX) 0.5 MG tablet Take 1 tablet (0.5 mg total) by mouth 2 (two) times daily as needed. for anxiety 180 tablet 1   atorvastatin (LIPITOR) 80 MG tablet Take 1 tablet (80 mg total) by mouth daily. 90 tablet 1   chlorpheniramine (CHLOR-TRIMETON) 4 MG tablet Take 8 mg by mouth daily as needed for allergies or rhinitis.      Cholecalciferol (VITAMIN D3) 50 MCG (2000 UT) capsule Take 1 capsule by mouth daily.     Cobalamin Combinations (B-12) 205 047 1071 MCG SUBL Take 0.5 tablets by mouth daily.     empagliflozin (JARDIANCE) 10 MG TABS tablet Take 1 tablet (10 mg total) by mouth daily before breakfast. 90 tablet 1   ezetimibe (ZETIA) 10 MG tablet Take 1 tablet (10 mg total) by mouth daily. 90 tablet 3   FLUoxetine (PROZAC) 20 MG capsule Take 1 capsule (20 mg total) by mouth daily. 90 capsule 1   HYDROcodone-acetaminophen (NORCO/VICODIN) 5-325 MG tablet Take 1  tablet by mouth at bedtime. (Patient taking differently: Take 1 tablet by mouth at bedtime as needed.) 30 tablet 0   Magnesium 250 MG TABS Take 1 tablet by mouth daily.     Magnesium Oxide 400 MG CAPS Take 400 mg by mouth daily.     Melatonin 5 MG CAPS      melatonin 5 MG TABS Take 5 mg by mouth at bedtime as needed.     methocarbamol (ROBAXIN) 500 MG tablet TAKE 1 TABLET BY MOUTH AT  BEDTIME AS NEEDED FOR  MUSCLE SPASM(S) 90 tablet 1   metoprolol succinate (TOPROL-XL) 50 MG 24 hr tablet Take 1.5 tablets (75 mg total) by mouth daily. Take with or immediately following a meal. 135 tablet 1   Multiple Vitamin (MULTIVITAMIN) tablet Take 1 tablet by mouth daily. Calcium, Magnesium, Zinc     pantoprazole (PROTONIX) 40 MG tablet Take 1 tablet (40 mg total) by mouth 2 (two) times daily. 180 tablet 0   PFIZER-BIONT COVID-19 VAC-TRIS SUSP injection      rivaroxaban (XARELTO) 20 MG TABS tablet Take 1 tablet (20 mg total) by mouth daily with supper. 90 tablet 3   sildenafil (VIAGRA) 100 MG tablet Take 100 mg by mouth daily as needed for erectile dysfunction.     solifenacin (VESICARE) 10 MG tablet Take 1 tablet (10 mg total) by mouth daily.     No current facility-administered medications for this visit.     Past Medical History:  Diagnosis Date   Allergy  Anxiety and depression    Arthritis    Atrial flutter [facility])    New diagnosis November 10, 2010, rate controlled   B12 deficiency    Blurred vision    episode with confusion, evaluated Dr. Erling Cruz   BPH (benign prostatic hypertrophy)    CAD (coronary artery disease)    stent, 2001 / nuclear, June, 2010, no ischemia   Diverticulosis of colon    Dyslipidemia    ED (erectile dysfunction)    Ejection fraction    GERD (gastroesophageal reflux disease)    pt denies, take omeprazole d/t taking several medications not GERD per pt   History of colonic polyps    HTN (hypertension)    Lumbar back pain    Obesity    OSA (obstructive sleep apnea)    PAF  (paroxysmal atrial fibrillation) (HCC)    Peripheral neuropathy    lower extremities   Prediabetes    Pulmonary nodule    Scalp lesion    November, 2011   Sinus bradycardia    Sleep apnea    no c-pap   Stroke ([place])    tia   Tobacco use    hx of   Vitamin D deficiency     Past Surgical History:  Procedure Laterality Date   arm fracture surgery     remotely   cardica stent x2     CLAVICLE SURGERY     remotely   [place] Right 07/11/2020   TONSILLECTOMY      Social History   Socioeconomic History   Marital status: Widowed    Spouse name: Not on file   Number of children: 0   Years of education: Not on file   Highest education level: Not on file  Occupational History   Occupation: retired---[employer]    Employer: [employer]  Tobacco Use   Smoking status: Every Day    Packs/day: 0.50    Years: 51.00    Pack years: 25.50    Types: Cigarettes    Start date: 1970   Smokeless tobacco: Never   Tobacco comments:    Still smoking max of #15/day  Vaping Use   Vaping Use: Never used  Substance and Sexual Activity   Alcohol use: Yes    Alcohol/week: 4.0 standard drinks    Types: 4 Standard drinks or equivalent per week    Comment: social   Drug use: No   Sexual activity: Not Currently  Other Topics Concern   Not on file  Social History Narrative   Wife had muscle dystrophy, lost wife ~10/2017   Now lives in a smaller house, by himself    Right handed   Caffeine: 1/2 caf 2-3 large cups coffee everyday   Social Determinants of Health   Financial Resource Strain: Medium Risk   Difficulty of Paying Living Expenses: Somewhat hard  Food Insecurity: Not on file  Transportation Needs: Not on file  Physical Activity: Sufficiently Active   Days of Exercise per Week: 5 days   Minutes of Exercise per Session: 30 min  Stress: Not on file  Social Connections: Not on file  Intimate Partner Violence: Not on file    Family History   Problem Relation Age of Onset   Stroke Mother    Alzheimer's disease Mother        M, brother; passed from this    Heart failure Father    Stroke Father    Alzheimer's disease Paternal Aunt    Alzheimer's  disease Brother        passed from this    Alzheimer's disease Maternal Grandmother        passed from this    Dementia Brother    Colon cancer Neg Hx    Esophageal cancer Neg Hx    Rectal cancer Neg Hx    Stomach cancer Neg Hx    Prostate cancer Neg Hx    IgA nephropathy Neg Hx    Headache Neg Hx     ROS: no fevers or chills, productive cough, hemoptysis, dysphasia, odynophagia, melena, hematochezia, dysuria, hematuria, rash, seizure activity, orthopnea, PND, pedal edema, claudication. Remaining systems are negative.  Physical Exam: Well-developed well-nourished in no acute distress.  Skin is warm and dry.  HEENT is normal.  Neck is supple.  Chest is clear to auscultation with normal expansion.  Cardiovascular exam is regular rate and rhythm.  Abdominal exam nontender or distended. No masses palpated. Extremities show no edema. neuro grossly intact  ECG-normal sinus rhythm 83, first-degree AV block, left bundle branch block.  Personally reviewed  A/P  1 paroxysmal atrial fibrillation-patient remains in sinus rhythm.  We will continue beta-blocker for rate control if atrial fibrillation recurs.  Continue Xarelto.  2 coronary artery disease-doing well with no chest pain.  Continue statin.  No aspirin given need for Xarelto.  3 hypertension-patient's blood pressure is controlled.  Continue present medical regimen.  4 hyperlipidemia-continue lipitor and zetia.  Most recent LDL 88.  He will change his diet by his report.  We will check lipids in 4 months.  If LDL not at goal we will need to consider addition of Repatha or Praluent.  5 tobacco abuse-patient again counseled on discontinuing.  6 preoperative evaluation-prior to left shoulder surgery.  No chest pain.   Recent nuclear study showed no ischemia.  No plans for further evaluation prior to surgery.  Kirk Ruths, MD

## 2021-04-02 DIAGNOSIS — M19012 Primary osteoarthritis, left shoulder: Secondary | ICD-10-CM | POA: Diagnosis not present

## 2021-04-02 DIAGNOSIS — M25512 Pain in left shoulder: Secondary | ICD-10-CM | POA: Diagnosis not present

## 2021-04-08 ENCOUNTER — Ambulatory Visit (INDEPENDENT_AMBULATORY_CARE_PROVIDER_SITE_OTHER): Payer: Medicare Other | Admitting: Pharmacist

## 2021-04-08 DIAGNOSIS — E785 Hyperlipidemia, unspecified: Secondary | ICD-10-CM

## 2021-04-08 DIAGNOSIS — N401 Enlarged prostate with lower urinary tract symptoms: Secondary | ICD-10-CM

## 2021-04-08 DIAGNOSIS — F17200 Nicotine dependence, unspecified, uncomplicated: Secondary | ICD-10-CM

## 2021-04-08 DIAGNOSIS — I10 Essential (primary) hypertension: Secondary | ICD-10-CM

## 2021-04-08 DIAGNOSIS — E118 Type 2 diabetes mellitus with unspecified complications: Secondary | ICD-10-CM

## 2021-04-08 DIAGNOSIS — F341 Dysthymic disorder: Secondary | ICD-10-CM

## 2021-04-08 NOTE — Chronic Care Management (AMB) (Signed)
Chronic Care Management Pharmacy Note  04/11/2021 Name:  ARGELIO GRANIER MRN:  [id number] DOB:  04/24/1951  Subjective: COLEY KULIKOWSKI is an 70 y.o. year old male who is a primary patient of Paz, Alda Berthold, MD.  The CCM team was consulted for assistance with disease management and care coordination needs.    Engaged with patient by telephone for follow up visit in response to provider referral for pharmacy case management and/or care coordination services.   Consent to Services:  The patient was given information about Chronic Care Management services, agreed to services, and gave verbal consent prior to initiation of services.  Please see initial visit note for detailed documentation.   Patient Care Team: Colon Branch, MD as PCP - General (Internal Medicine) Rigoberto Noel, MD as Consulting Physician (Pulmonary Disease) Franchot Gallo, MD as Consulting Physician (Urology) Ladene Artist, MD as Consulting Physician (Gastroenterology) Stanford Breed Denice Bors, MD as Consulting Physician (Cardiology) Shawnie Dapper, DO as Consulting Physician (Optometry) Cherre Robins, RPH-CPP (Pharmacist)  Recent office visits: 03/10/2021 - PCP (Dr Larose Kells) - F/U chronic conditions. Started Jardiance 74m daily. Stopped vitamin D 50,000 IU weekly - now taking 1000 IU daily. 11/08/2020 - PCP (Dr PLarose Kells Seen for routine F/U. Changed metoprolol tartrate to metoprolol succinate ER 773monce a day. Added ergocalciferol 50,000 units weekly. also recommended reassessment of sleep apnea due to fatigue.   Recent consult visits: 04/04/2021 - Ortho Surgery (Dr BrSmitty Cordsprep for surgery 04/02/2021 - Ortho Surgery ([facility] and arthritis of left glenohumeral joint. MRI of left shoulder ordered. Patient to proceed with left shoulder surgery.  03/11/2021 - PuDickie LaNP) Smoker - pre CT visit 01/21/2021 - [facility] of Charcot Marie Tooth disease.   Genetic testing performed.  01/17/2021 - [facility] ([facility] 07/2020. Left shoulder pain - has not improved with injections. Discussed surgery. Continue with observation for now. F/U 6 months. 10/16/20 Orthopedics (Dr BrSmitty Cords [facility]/U right reverse total shoulder arthroplasty and biceps tenodesis (surgery 07/11/2020) No med changes.   Hospital visits: 07/11/2020 - surgery on right shoulder  Objective:  Lab Results  Component Value Date   CREATININE 0.86 11/08/2020   CREATININE 0.9 06/13/2020   CREATININE 0.71 04/03/2020    Lab Results  Component Value Date   HGBA1C 7.0 (H) 03/10/2021   Last diabetic Eye exam:  Lab Results  Component Value Date/Time   HMDIABEYEEXA No Retinopathy 09/18/2020 12:00 AM    Last diabetic Foot exam:  Lab Results  Component Value Date/Time   HMDIABFOOTEX Normal/House Calls 01/30/2021 12:00 AM        Component Value Date/Time   CHOL 149 03/10/2021 1412   CHOL 128 12/14/2018 1120   TRIG 262.0 (H) 03/10/2021 1412   HDL 36.90 (L) 03/10/2021 1412   HDL 29 (L) 12/14/2018 1120   CHOLHDL 4 03/10/2021 1412   VLDL 52.4 (H) 03/10/2021 1412   LDLCALC 70 01/22/2020 1028   LDLCALC 60 12/14/2018 1120   LDLDIRECT 88.0 03/10/2021 1412    Hepatic Function Latest Ref Rng & Units 06/13/2020 04/03/2020 04/01/2020  Total Protein 6.1 - 8.1 g/dL - 6.1 6.4(L)  Albumin 3.5 - 5.0 g/dL - - 3.9  AST 14 - 40 19 21 19   ALT 10 - 40 18 23 21   Alk Phosphatase 25 - 125 89 - 55  Total Bilirubin  0.2 - 1.2 mg/dL - 0.6 0.6  Bilirubin, Direct <=0.2 mg/dL - - -    Lab Results  Component Value Date/Time   TSH 1.47 04/03/2020 03:52 PM   TSH 1.44 08/17/2018 10:38 AM    CBC Latest Ref Rng & Units 11/08/2020 08/07/2020 06/13/2020  WBC 3.8 - 10.8 Thousand/uL 7.3 5.0 6.7  Hemoglobin 13.2 - 17.1 g/dL 14.7 13.2 14.7  Hematocrit 38.5 - 50.0 % 45.1 40.0 46  Platelets 140 - 400 Thousand/uL 249 296.0 225     Lab Results  Component Value Date/Time   VD25OH 29 (L) 11/08/2020 02:02 PM   VD25OH 54.29 12/25/2013 10:23 AM   VD25OH 36 02/12/2011 11:03 AM    Clinical ASCVD: Yes  The 10-year ASCVD risk score (Arnett DK, et al., 2019) is: 45.6%   Values used to calculate the score:     Age: 55 years     Sex: Male     Is Non-Hispanic African American: No     Diabetic: Yes     Tobacco smoker: Yes     Systolic Blood Pressure: 256 mmHg     Is BP treated: Yes     HDL Cholesterol: 36.9 mg/dL     Total Cholesterol: 149 mg/dL     Social History   Tobacco Use  Smoking Status Every Day   Packs/day: 0.50   Years: 51.00   Pack years: 25.50   Types: Cigarettes   Start date: 1970  Smokeless Tobacco Never  Tobacco Comments   Still smoking max of #15/day   BP Readings from Last 3 Encounters:  04/09/21 128/76  03/10/21 128/64  11/08/20 122/74   Pulse Readings from Last 3 Encounters:  04/09/21 63  03/10/21 61  11/08/20 64   Wt Readings from Last 3 Encounters:  04/09/21 276 lb (125.2 kg)  03/10/21 278 lb 8 oz (126.3 kg)  11/08/20 279 lb 2 oz (126.6 kg)    Assessment: Review of patient past medical history, allergies, medications, health status, including review of consultants reports, laboratory and other test data, was performed as part of comprehensive evaluation and provision of chronic care management services.   SDOH:  (Social Determinants of Health) assessments and interventions performed:  SDOH Interventions    Flowsheet Row Most Recent Value  SDOH Interventions   Physical Activity Interventions Intervention Not Indicated        CCM Care Plan  Allergies  Allergen Reactions   Tussionex Pennkinetic Er [Hydrocod Polst-Cpm Polst Er] Hives    No current issues, only had issues when he was 70 years old   Albuterol Palpitations   Qvar [Beclomethasone] Palpitations    Medications Reviewed Today     Reviewed by Lelon Perla, MD (Physician) on 04/09/21 at [place]  List Status: <None>   Medication Order Taking? Sig Documenting Provider Last Dose Status Informant  acetaminophen (TYLENOL) 500 MG tablet [id number] Yes Take 500 mg by mouth every 6 (six) hours as needed. [provider] Taking Active   ALPRAZolam Duanne Moron) 0.5 MG tablet [id number] Yes Take 1 tablet (0.5 mg total) by mouth 2 (two) times daily as needed. for anxiety Colon Branch, MD Taking Active   atorvastatin (LIPITOR) 80 MG tablet [id number] Yes Take 1 tablet (80 mg total) by mouth daily. Colon Branch, MD Taking Active   chlorpheniramine (CHLOR-TRIMETON) 4 MG tablet [id number] Yes Take 8 mg by mouth daily as needed for allergies or rhinitis.  [provider] Taking Active   Cholecalciferol (VITAMIN D3) 50  MCG (2000 UT) capsule [id number] Yes Take 1 capsule by mouth daily. [provider] Taking Active   Cobalamin Combinations (B-12) [phone] MCG SUBL [id number] Yes Take 0.5 tablets by mouth daily. [provider] Taking Active   empagliflozin (JARDIANCE) 10 MG TABS tablet [id number] Yes Take 1 tablet (10 mg total) by mouth daily before breakfast. Colon Branch, MD Taking Active   ezetimibe (ZETIA) 10 MG tablet [id number] Yes Take 1 tablet (10 mg total) by mouth daily. Colon Branch, MD Taking Active   FLUoxetine (PROZAC) 20 MG capsule [id number] Yes Take 1 capsule (20 mg total) by mouth daily. Colon Branch, MD Taking Active   HYDROcodone-acetaminophen (NORCO/VICODIN) 5-325 MG tablet [id number] Yes Take 1 tablet by mouth at bedtime.  Patient taking differently: Take 1 tablet by mouth at bedtime as needed.   Colon Branch, MD Taking Active   Magnesium 250 MG TABS [id number] Yes Take 1 tablet by mouth daily. [provider] Taking Active   Magnesium Oxide 400 MG CAPS [id number] Yes Take 400 mg by mouth daily. [provider] Taking Active   Melatonin 5 MG CAPS [id number] Yes  [provider] Taking Active   melatonin 5 MG TABS [id number] Yes Take 5 mg by mouth  at bedtime as needed. [provider] Taking Active   methocarbamol (ROBAXIN) 500 MG tablet [id number] Yes TAKE 1 TABLET BY MOUTH AT  BEDTIME AS NEEDED FOR  MUSCLE SPASM(S) Colon Branch, MD Taking Active   metoprolol succinate (TOPROL-XL) 50 MG 24 hr tablet [id number] Yes Take 1.5 tablets (75 mg total) by mouth daily. Take with or immediately following a meal. Colon Branch, MD Taking Active   Multiple Vitamin (MULTIVITAMIN) tablet [id number] Yes Take 1 tablet by mouth daily. Calcium, Magnesium, Zinc [provider] Taking Active   pantoprazole (PROTONIX) 40 MG tablet [id number] Yes Take 1 tablet (40 mg total) by mouth 2 (two) times daily. Ladene Artist, MD Taking Active   PFIZER-BIONT COVID-19 VAC-TRIS SUSP injection [id number] Yes  [provider] Taking Active   rivaroxaban (XARELTO) 20 MG TABS tablet [id number] Yes Take 1 tablet (20 mg total) by mouth daily with supper. Colon Branch, MD Taking Active   sildenafil (VIAGRA) 100 MG tablet [id number] Yes Take 100 mg by mouth daily as needed for erectile dysfunction. [provider] Taking Active   solifenacin (VESICARE) 10 MG tablet [id number] Yes Take 1 tablet (10 mg total) by mouth daily. Franchot Gallo, MD Taking Active             Patient Active Problem List   Diagnosis Date Noted   Insomnia 08/08/2020   Nonintractable episodic headache 12/14/2019   Controlled diabetes mellitus type 2 with complications ([place]) 74/05/8785   COPD mixed type ([place]) 04/18/2018   OSA (obstructive sleep apnea) 10/16/2015   Bruit 04/24/2015   PCP NOTES >>>>>> 02/25/2015   Hx of colonic polyps 06/25/2014   Sinus bradycardia 06/19/2014   Annual physical exam >>>>>>>>>>>>>>>>>> 04/28/2014   Ejection fraction    GERD (gastroesophageal reflux disease) 02/11/2011   Paroxysmal atrial flutter (HCC)    HTN (hypertension)    CAD (coronary artery disease)    Dyslipidemia    Obesity    B12 deficiency    Vitamin D deficiency 05/20/2009    Polyneuropathy 09/10/2008   BACK PAIN, LUMBAR--UDS 08/03/2008   ERECTILE DYSFUNCTION 02/09/2008   BPH (benign prostatic hyperplasia) 02/09/2008   COLONIC POLYPS 07/23/2007   CIGARETTE SMOKER 07/23/2007   Solitary pulmonary  nodule-chest x-ray stable 08-2014, no further schedule imaging 07/23/2007   DIVERTICULOSIS OF COLON 07/23/2007   ANXIETY DEPRESSION 07/08/2007    Immunization History  Administered Date(s) Administered   Fluad Quad(high Dose 65+) 02/10/2019, 03/10/2021   Influenza Split 06/05/2012, 06/20/2013, 04/10/2014   Influenza Whole 04/08/2009, 04/22/2010, 02/11/2011   Influenza, High Dose Seasonal PF 03/02/2016   Influenza-Unspecified 03/28/2015, 03/17/2017, 03/10/2018, 03/14/2020   PFIZER(Purple Top)SARS-COV-2 Vaccination 06/29/2019, 07/19/2019, 03/14/2020, 12/09/2020   Pneumococcal Conjugate-13 04/27/2014   Pneumococcal Polysaccharide-23 05/19/2010, 01/22/2020   Td 05/19/2010   Tdap 12/09/2020   Zoster Recombinat (Shingrix) 04/06/2019, 06/18/2019   Zoster, Live 12/25/2013    Conditions to be addressed/monitored: Atrial Fibrillation, CAD, HTN, HLD, Anxiety, and neuropathy; GERD; OSA; vitamin B12 and D deficiencies; BPH with LUTS  Care Plan : General Pharmacy (Adult)  Updates made by Cherre Robins, RPH-CPP since 04/11/2021 12:00 AM     Problem: Tobacco use, Depression, Pain, GERD   Priority: High  Onset Date: 08/01/2020     Long-Range Goal: Provide education, support and care coordination for medication therapy and chronic conditions   Start Date: 08/01/2020  Expected End Date: 01/29/2021  This Visit's Progress: On track  Recent Progress: On track  Priority: High  Note:   Current Barriers:  Unable to maintain smoking cessation. Patient has questions about recent lab and CT tests.   Pharmacist Clinical Goal(s):  Over the next 120 days, patient will achieve adherence to monitoring guidelines and medication adherence to achieve therapeutic efficacy contact  provider office for questions/concerns as evidenced notation of same in electronic health record Work towards smoking cessation by NRT and support through collaboration with PharmD and provider.  Answer patient questions and provide education regarding lab results and medications.  Interventions: 1:1 collaboration with Colon Branch, MD regarding development and update of comprehensive plan of care as evidenced by provider attestation and co-signature Inter-disciplinary care team collaboration (see longitudinal plan of care) Comprehensive medication review performed; medication list updated in electronic medical record  Type 2 diabetes:  Last A1c increased to 7.0% - not at goal of A1c < 7.0% Current treatment:  Jardiance 41m daily (started 03/10/2021) Diet: improved over the last 2 weeks. Prior to labs in October patient was eating more sweets and carbs Interventions:  Continue to limit intake of sugar and high carbohydrate foods Continue to exercise daily - goal of 150 minutes of moderate intensity exercise per week Discussed benefits of Jardiance for blood glucose, kidneys and heart Reviewed home blood glucose readings and reviewed goals  Fasting blood glucose goal (before meals) = 80 to 130 Blood glucose goal after a meal = less than 180   Hyperlipidemia, mixed:  LDL goal <70; Triglyceride goal <150 LDL at goal but Tg increased with last labs Current treatment:  Atorvastatin 816mdaily  Ezetimibe 1022maily  Interventions: Reviewed last lipid panel from 03/10/2021 Reviewed goal for lipid lowering therapy Continue with recent dietary changes (should help with lowering triglycerides)  Continue current treatment for hyperlipidemia Continue to exercise daily   Depression/Anxiety (Goal: Minimize symptoms) Controlled Current treatment: Alprazolam 0.5 mg twice a day as needed  Fluoxetine 20 mg daily each morning Medications previously tried/failed: none noted Reports stable mood;  Last PHQ9 score was improved slightly from 12 to 11. Patient states thought that he has few depressive symptoms and feels that fluoxetine is working well.    PHQ9 SCORE ONLY 03/10/2021 11/08/2020 08/07/2020  PHQ-9 Total Score 11 12 10   Interventions:  Educated on benefits of medication for  symptom control Recommended to continue current medication  Tobacco use (Goal: Smoking cessation) Not controlled Current treatment  None at this time (he has both nicotine gum and patches but not currently using) Patient smokes first cigarette about 60 minutes of waking Patient triggers include: drinking alcohol and coffee On a scale of 1-10, reports MOTIVATION to quit is 7 On a scale of 1-10, reports CONFIDENCE in quitting is 8 Previous quit attempts: Last attempted to quit in early 2022 and also October 2021.  States he plans to try to quit again just needs to find the motivation Currently smokes about 8 to 10 cigs per day. Reports he no longer buys cartons of cigarettes / buys 1 pack at a time. Interventions:  Discussed readiness for attempt at smoking cessation. Patient in contemplative stage. He has resources to help with smoking cessation is will reach out to our office if assistance needed.  Recommended patient continue to work at smoking cessation.  GERD (Goal: minimize symptoms) Controlled; denies any breakthrough reflux symptoms Gastroenterologist recommended long term PPI due to gastritis on EDG in 2021. Current treatment  Pantoprazole 61m twice daily Medications previously tried: none noted  Denies any blood in stool, gastritis resolved. Interventions: (addressed at previous visit) Recommended to continue current medication  Pain / Neuropathy (Goal: Minimize symptoms) Right shoulder pain improved since shoulder surgery in early 2022 Now having pain in left shoulder and is planning to have surgery sometime in November 2022. Will have MRI 04/14/2021 [facility] Charcot-Marie-Tooth neuropathy. Initial genetic testing was negative. Will have further testing later in November 2022.  Current treatment  Methocarbamol 5053mat bedtime as needed Hydrocodone-APAP 5-235 mg at bedtime as needed (using very rarely)  B12 supplement 100087mdaily(recently started) Medications previously tried: etodolac (concerns with Xarelto / increased bleeding); soma (in effective) Reports that he feels energy level has improved since starting vitamin B12 Increase in exercise. Interventions:  Continue to use pain medication sparingly Continue exercises from PT for shoulder strength and to walk daily [facility] Managed by urology - Dr DahLuberta Robertsonrrent treatment:  Solifenacin (Vesicare) 73m21mily Past therapies: oxybutynin (not effective) Has been able to get solifenacin refilled regularly since last visit. Feels that urinary symptoms have improved since restarted solifenacin.  Interventions:  Coordinated with Urology office to get solifenacin sent to [facility] (previous Rx was sent to old pharmacy [facility]) (Completed at last visit)a Discussed side effects to monitor for from solifenacin Continue current therapy.  Health Maintenance:  Reviewed vaccination history and discussed benefits of COVID bivalent booster Plans to get COVID bivalent booster soon.  Medication Management:  Reviewed refill history Comprehensive medication review performed and updated medication list   Patient Goals/Self-Care Activities Over the next 120 days, patient will:  take medications as prescribed reach out to provider with increased or changing symptoms of pain.  Reach out for support in smoking cessation Get COVID bivalent booster  Follow Up Plan: The care management team will reach out to the patient again over the next 90 days. Clinical pharmacist appt in 3 to 4 months.        Medication Assistance:  None  needed; Seems patient has partial / full LIS for 2022. Per [facility] his coapy for Xarelto has been $9.85  [facility]:  [facility] [address] [place]  Phone: [phone] Fax: [phone]  [facility] - [place], [address] [place] [address] Phone: [phone] Fax: [phone]   Follow Up:  Patient agrees to Care Plan and Follow-up.  Plan: Telephone follow up appointment with care management team member scheduled for:  January 2023  Cherre Robins, PharmD Clinical Pharmacist [place] [phone]

## 2021-04-09 ENCOUNTER — Ambulatory Visit: Payer: Medicare Other | Admitting: Cardiology

## 2021-04-09 ENCOUNTER — Encounter: Payer: Self-pay | Admitting: Cardiology

## 2021-04-09 ENCOUNTER — Other Ambulatory Visit: Payer: Self-pay

## 2021-04-09 VITALS — BP 128/76 | HR 63 | Ht 78.0 in | Wt 276.0 lb

## 2021-04-09 DIAGNOSIS — Z01818 Encounter for other preprocedural examination: Secondary | ICD-10-CM

## 2021-04-09 DIAGNOSIS — I480 Paroxysmal atrial fibrillation: Secondary | ICD-10-CM

## 2021-04-09 DIAGNOSIS — Z01810 Encounter for preprocedural cardiovascular examination: Secondary | ICD-10-CM

## 2021-04-09 DIAGNOSIS — E7800 Pure hypercholesterolemia, unspecified: Secondary | ICD-10-CM

## 2021-04-09 DIAGNOSIS — I2510 Atherosclerotic heart disease of native coronary artery without angina pectoris: Secondary | ICD-10-CM

## 2021-04-09 DIAGNOSIS — I10 Essential (primary) hypertension: Secondary | ICD-10-CM | POA: Diagnosis not present

## 2021-04-09 NOTE — Patient Instructions (Signed)

## 2021-04-10 DIAGNOSIS — M19012 Primary osteoarthritis, left shoulder: Secondary | ICD-10-CM | POA: Diagnosis not present

## 2021-04-10 DIAGNOSIS — M25512 Pain in left shoulder: Secondary | ICD-10-CM | POA: Diagnosis not present

## 2021-04-11 NOTE — Addendum Note (Signed)
Addended by: Jacqulynn Cadet on: 04/11/2021 01:26 PM   Modules accepted: Orders

## 2021-04-11 NOTE — Patient Instructions (Signed)
Visit Information CARE PLAN ENTRY (see longitudinal plan of care for additional care plan information)  Current Barriers:  Chronic Disease Management support, education, and care coordination needs related to Aflutter, Pre-Diabetes, COPD, Tobacco Use Disorder, Hypertension, Hyperlipidemia/CAD, Depression/Anxiety, Overactive Bladder, GERD, Chronic Pain   Hypertension BP Readings from Last 3 Encounters:  04/09/21 128/76  03/10/21 128/64  11/08/20 122/74   Pharmacist Clinical Goal(s): Over the next 90 days, patient will work with PharmD and providers to maintain BP goal <140/90 Current regimen:  Metoprolol XL 50 mg 1.5 tabs daily  Patient self care activities - Over the next 90 days, patient will: Maintain hypertension medication regimen.  Hyperlipidemia, mixed Lipid Panel     Component Value Date/Time   CHOL 149 03/10/2021 1412   CHOL 128 12/14/2018 1120   TRIG 262.0 (H) 03/10/2021 1412   HDL 36.90 (L) 03/10/2021 1412   HDL 29 (L) 12/14/2018 1120   CHOLHDL 4 03/10/2021 1412   VLDL 52.4 (H) 03/10/2021 1412   LDLCALC 70 01/22/2020 1028   LDLCALC 60 12/14/2018 1120   LDLDIRECT 88.0 03/10/2021 1412   LABVLDL 39 12/14/2018 1120    Pharmacist Clinical Goal(s): Over the next 90 days, patient will work with PharmD and providers to achieve LDL goal < 70; Triglyceride goal <150 Current regimen:  Atorvastatin 80 mg daily Ezetimibe 10 mg daily  Interventions: Reviewed last lipid panel from 03/10/2021 Reviewed goal for lipid lowering therapy Patient self care activities - Over the next 90 days, patient will: Maintain cholesterol medication regimen. Continue with recent dietary changes (should help with lowering triglycerides)  Continue current treatment for hyperlipidemia Continue to exercise daily    Diabetes Lab Results  Component Value Date/Time   HGBA1C 7.0 (H) 03/10/2021 02:12 PM   HGBA1C 6.4 08/07/2020 02:34 PM   Pharmacist Clinical Goal(s): Over the next 90 days,  patient will work with PharmD and providers to achieve A1c goal <6.5% Current regimen:  Jardiance 10mg  daily Interventions: Discussed benefits of Jardiance for blood glucose, kidneys and heart Reviewed home blood glucose goals  Fasting blood glucose goal (before meals) = 80 to 130 Blood glucose goal after a meal = less than 180  Patient self care activities - Over the next 90 days, patient will: Continue Jardiance Continue to limit intake of sugar and high carbohydrate foods Continue to exercise daily - goal of 150 minutes of moderate intensity exercise per week  GERD Pharmacist Clinical Goal(s) Over the next 90 days, patient will work with PharmD and providers to reduce symptoms of GERD Current regimen:  Pantoprazole 40mg  twice daily Patient self care activities - Over the next 90 days, patient will: Continue current medication  Depression/Anxiety Pharmacist Clinical Goal(s) Over the next 90 days, patient will work with PharmD and providers to reduce symptoms of depression/anxiety Current regimen:  Alprazolam 0.5 mg twice a day if needed for anxiety Fluoxetine 20 mg daily in the morning Patient self care activities - Over the next 90 days, patient will: Maintain depression/anxiety medication regimen.    Tobacco Use Disorder Pharmacist Clinical Goal(s) Over the next 90 days, patient will work with PharmD and providers to stop smoking Current regimen:  none Patient self care activities - Over the next 90 days, patient will: Try to decrease number of cigarettes smoked per day Contact provider or pharmacist if you would like assistance to stop smoking.   Health Maintenance  Pharmacist Clinical Goal(s) Over the next 180 days, patient will work with PharmD and providers to complete health maintenance screenings/vaccinations  Interventions: Reviewed immunization records - due COVID booster  Patient self care activities - Over the next 180 days, patient will: Get COVID  booster  Pain  Pharmacist Clinical Goal(s) Minimize symptoms Current treatment  Methocarbamol 500mg  at bedtime as needed Hydrocodone-APAP 5-235 mg at bedtime as needed (using very rarely)  B12 supplement 1058mcg daily(recently started) Patient self care activities - Over the next 180 days, patient will: Continue to use pain medication sparingly Continue exercises from PT for shoulder strength [facility] appointment [facility] physicians at [facility]  BPH with LUTS: Pharmacist Clinical Goal(s) Minimize urinary urgency and frequency Current treatment  Solifenacin (Vesicare) 10mg  daily Interventions:  Discussed side effects to monitor for from solifenacin Patient self care activities - Over the next 180 days, patient will: Continue current therapy - contact either pharmacist or urology office if dry mouth is not tolerable.  Medication management Pharmacist Clinical Goal(s): Over the next 90 days, patient will work with PharmD and providers to maintain optimal medication adherence Current pharmacy:  [facility] Interventions Comprehensive medication review performed. Continue current medication management strategy Pharmacist contacted [facility] to make sure they had all your maintenance medications on file now.  Patient self care activities - Over the next 90 days, patient will: Focus on medication adherence by filling and taking medications appropriately  Take medications as prescribed Report any questions or concerns to PharmD and/or provider(s)  Patient verbalizes understanding of instructions provided today and agrees to view in [place].   Telephone follow up appointment with care management team member scheduled for: January 2023  Cherre Robins, PharmD Clinical Pharmacist [facility]

## 2021-04-23 ENCOUNTER — Other Ambulatory Visit: Payer: Self-pay | Admitting: Urology

## 2021-05-05 ENCOUNTER — Encounter: Payer: Self-pay | Admitting: Internal Medicine

## 2021-05-06 ENCOUNTER — Other Ambulatory Visit: Payer: Self-pay | Admitting: Internal Medicine

## 2021-05-07 DIAGNOSIS — R350 Frequency of micturition: Secondary | ICD-10-CM | POA: Diagnosis not present

## 2021-05-07 DIAGNOSIS — F341 Dysthymic disorder: Secondary | ICD-10-CM | POA: Diagnosis not present

## 2021-05-07 DIAGNOSIS — N401 Enlarged prostate with lower urinary tract symptoms: Secondary | ICD-10-CM

## 2021-05-07 DIAGNOSIS — M12812 Other specific arthropathies, not elsewhere classified, left shoulder: Secondary | ICD-10-CM | POA: Diagnosis not present

## 2021-05-07 DIAGNOSIS — R9431 Abnormal electrocardiogram [ECG] [EKG]: Secondary | ICD-10-CM | POA: Diagnosis not present

## 2021-05-07 DIAGNOSIS — I10 Essential (primary) hypertension: Secondary | ICD-10-CM

## 2021-05-07 DIAGNOSIS — Z01818 Encounter for other preprocedural examination: Secondary | ICD-10-CM | POA: Diagnosis not present

## 2021-05-07 DIAGNOSIS — G629 Polyneuropathy, unspecified: Secondary | ICD-10-CM | POA: Diagnosis not present

## 2021-05-07 DIAGNOSIS — J449 Chronic obstructive pulmonary disease, unspecified: Secondary | ICD-10-CM | POA: Diagnosis not present

## 2021-05-07 DIAGNOSIS — E785 Hyperlipidemia, unspecified: Secondary | ICD-10-CM

## 2021-05-07 DIAGNOSIS — G4733 Obstructive sleep apnea (adult) (pediatric): Secondary | ICD-10-CM | POA: Diagnosis not present

## 2021-05-07 DIAGNOSIS — E118 Type 2 diabetes mellitus with unspecified complications: Secondary | ICD-10-CM | POA: Diagnosis not present

## 2021-05-07 DIAGNOSIS — M75102 Unspecified rotator cuff tear or rupture of left shoulder, not specified as traumatic: Secondary | ICD-10-CM | POA: Diagnosis not present

## 2021-05-07 DIAGNOSIS — Z01810 Encounter for preprocedural cardiovascular examination: Secondary | ICD-10-CM | POA: Diagnosis not present

## 2021-05-07 DIAGNOSIS — M25519 Pain in unspecified shoulder: Secondary | ICD-10-CM | POA: Diagnosis not present

## 2021-05-07 DIAGNOSIS — R7989 Other specified abnormal findings of blood chemistry: Secondary | ICD-10-CM | POA: Diagnosis not present

## 2021-05-07 DIAGNOSIS — I2510 Atherosclerotic heart disease of native coronary artery without angina pectoris: Secondary | ICD-10-CM | POA: Diagnosis not present

## 2021-05-07 DIAGNOSIS — R079 Chest pain, unspecified: Secondary | ICD-10-CM | POA: Diagnosis not present

## 2021-05-08 ENCOUNTER — Ambulatory Visit: Payer: Medicare Other | Admitting: Gastroenterology

## 2021-05-13 DIAGNOSIS — R079 Chest pain, unspecified: Secondary | ICD-10-CM | POA: Diagnosis not present

## 2021-05-15 DIAGNOSIS — M12812 Other specific arthropathies, not elsewhere classified, left shoulder: Secondary | ICD-10-CM | POA: Diagnosis not present

## 2021-05-15 DIAGNOSIS — Z96612 Presence of left artificial shoulder joint: Secondary | ICD-10-CM | POA: Diagnosis not present

## 2021-05-15 DIAGNOSIS — M75112 Incomplete rotator cuff tear or rupture of left shoulder, not specified as traumatic: Secondary | ICD-10-CM | POA: Diagnosis not present

## 2021-05-15 DIAGNOSIS — G4730 Sleep apnea, unspecified: Secondary | ICD-10-CM | POA: Diagnosis not present

## 2021-05-15 DIAGNOSIS — I2510 Atherosclerotic heart disease of native coronary artery without angina pectoris: Secondary | ICD-10-CM | POA: Diagnosis not present

## 2021-05-15 DIAGNOSIS — Z79899 Other long term (current) drug therapy: Secondary | ICD-10-CM | POA: Diagnosis not present

## 2021-05-15 DIAGNOSIS — I4891 Unspecified atrial fibrillation: Secondary | ICD-10-CM | POA: Diagnosis not present

## 2021-05-15 DIAGNOSIS — J449 Chronic obstructive pulmonary disease, unspecified: Secondary | ICD-10-CM | POA: Diagnosis not present

## 2021-05-15 DIAGNOSIS — F17210 Nicotine dependence, cigarettes, uncomplicated: Secondary | ICD-10-CM | POA: Diagnosis not present

## 2021-05-15 DIAGNOSIS — K219 Gastro-esophageal reflux disease without esophagitis: Secondary | ICD-10-CM | POA: Diagnosis not present

## 2021-05-15 DIAGNOSIS — Z471 Aftercare following joint replacement surgery: Secondary | ICD-10-CM | POA: Diagnosis not present

## 2021-05-15 DIAGNOSIS — Z885 Allergy status to narcotic agent status: Secondary | ICD-10-CM | POA: Diagnosis not present

## 2021-05-15 DIAGNOSIS — E785 Hyperlipidemia, unspecified: Secondary | ICD-10-CM | POA: Diagnosis not present

## 2021-05-15 DIAGNOSIS — Z888 Allergy status to other drugs, medicaments and biological substances status: Secondary | ICD-10-CM | POA: Diagnosis not present

## 2021-05-15 DIAGNOSIS — M19012 Primary osteoarthritis, left shoulder: Secondary | ICD-10-CM | POA: Diagnosis not present

## 2021-05-15 DIAGNOSIS — Z8249 Family history of ischemic heart disease and other diseases of the circulatory system: Secondary | ICD-10-CM | POA: Diagnosis not present

## 2021-05-15 DIAGNOSIS — Z823 Family history of stroke: Secondary | ICD-10-CM | POA: Diagnosis not present

## 2021-05-15 DIAGNOSIS — E119 Type 2 diabetes mellitus without complications: Secondary | ICD-10-CM | POA: Diagnosis not present

## 2021-05-15 DIAGNOSIS — G8918 Other acute postprocedural pain: Secondary | ICD-10-CM | POA: Diagnosis not present

## 2021-05-15 DIAGNOSIS — I10 Essential (primary) hypertension: Secondary | ICD-10-CM | POA: Diagnosis not present

## 2021-05-15 DIAGNOSIS — Z955 Presence of coronary angioplasty implant and graft: Secondary | ICD-10-CM | POA: Diagnosis not present

## 2021-05-16 DIAGNOSIS — J449 Chronic obstructive pulmonary disease, unspecified: Secondary | ICD-10-CM | POA: Diagnosis not present

## 2021-05-16 DIAGNOSIS — Z823 Family history of stroke: Secondary | ICD-10-CM | POA: Diagnosis not present

## 2021-05-16 DIAGNOSIS — Z955 Presence of coronary angioplasty implant and graft: Secondary | ICD-10-CM | POA: Diagnosis not present

## 2021-05-16 DIAGNOSIS — Z79899 Other long term (current) drug therapy: Secondary | ICD-10-CM | POA: Diagnosis not present

## 2021-05-16 DIAGNOSIS — Z8249 Family history of ischemic heart disease and other diseases of the circulatory system: Secondary | ICD-10-CM | POA: Diagnosis not present

## 2021-05-16 DIAGNOSIS — I4891 Unspecified atrial fibrillation: Secondary | ICD-10-CM | POA: Diagnosis not present

## 2021-05-16 DIAGNOSIS — E119 Type 2 diabetes mellitus without complications: Secondary | ICD-10-CM | POA: Diagnosis not present

## 2021-05-16 DIAGNOSIS — F17210 Nicotine dependence, cigarettes, uncomplicated: Secondary | ICD-10-CM | POA: Diagnosis not present

## 2021-05-16 DIAGNOSIS — Z885 Allergy status to narcotic agent status: Secondary | ICD-10-CM | POA: Diagnosis not present

## 2021-05-16 DIAGNOSIS — M75112 Incomplete rotator cuff tear or rupture of left shoulder, not specified as traumatic: Secondary | ICD-10-CM | POA: Diagnosis not present

## 2021-05-16 DIAGNOSIS — E785 Hyperlipidemia, unspecified: Secondary | ICD-10-CM | POA: Diagnosis not present

## 2021-05-16 DIAGNOSIS — Z888 Allergy status to other drugs, medicaments and biological substances status: Secondary | ICD-10-CM | POA: Diagnosis not present

## 2021-05-27 DIAGNOSIS — M19012 Primary osteoarthritis, left shoulder: Secondary | ICD-10-CM | POA: Diagnosis not present

## 2021-05-27 DIAGNOSIS — M25512 Pain in left shoulder: Secondary | ICD-10-CM | POA: Diagnosis not present

## 2021-06-04 ENCOUNTER — Other Ambulatory Visit: Payer: Self-pay | Admitting: Internal Medicine

## 2021-06-04 DIAGNOSIS — R739 Hyperglycemia, unspecified: Secondary | ICD-10-CM

## 2021-06-05 ENCOUNTER — Other Ambulatory Visit: Payer: Self-pay | Admitting: Internal Medicine

## 2021-06-05 DIAGNOSIS — E785 Hyperlipidemia, unspecified: Secondary | ICD-10-CM

## 2021-06-11 DIAGNOSIS — Z96612 Presence of left artificial shoulder joint: Secondary | ICD-10-CM | POA: Diagnosis not present

## 2021-06-11 DIAGNOSIS — M19012 Primary osteoarthritis, left shoulder: Secondary | ICD-10-CM | POA: Diagnosis not present

## 2021-06-18 DIAGNOSIS — M19012 Primary osteoarthritis, left shoulder: Secondary | ICD-10-CM | POA: Diagnosis not present

## 2021-06-18 DIAGNOSIS — Z96612 Presence of left artificial shoulder joint: Secondary | ICD-10-CM | POA: Diagnosis not present

## 2021-06-19 ENCOUNTER — Other Ambulatory Visit: Payer: Self-pay | Admitting: Gastroenterology

## 2021-06-19 DIAGNOSIS — K229 Disease of esophagus, unspecified: Secondary | ICD-10-CM

## 2021-06-19 DIAGNOSIS — K2970 Gastritis, unspecified, without bleeding: Secondary | ICD-10-CM

## 2021-06-23 ENCOUNTER — Ambulatory Visit: Payer: Medicare Other | Admitting: Gastroenterology

## 2021-06-23 ENCOUNTER — Encounter: Payer: Self-pay | Admitting: Gastroenterology

## 2021-06-23 VITALS — BP 110/62 | HR 64 | Ht 78.0 in | Wt 266.6 lb

## 2021-06-23 DIAGNOSIS — K2970 Gastritis, unspecified, without bleeding: Secondary | ICD-10-CM | POA: Diagnosis not present

## 2021-06-23 DIAGNOSIS — K229 Disease of esophagus, unspecified: Secondary | ICD-10-CM | POA: Diagnosis not present

## 2021-06-23 DIAGNOSIS — K219 Gastro-esophageal reflux disease without esophagitis: Secondary | ICD-10-CM | POA: Diagnosis not present

## 2021-06-23 MED ORDER — PANTOPRAZOLE SODIUM 40 MG PO TBEC: 40.0000 mg | DELAYED_RELEASE_TABLET | Freq: Two times a day (BID) | ORAL | 3 refills | Status: DC

## 2021-06-23 NOTE — Patient Instructions (Signed)
We have sent the following prescriptions to your mail in pharmacy: pantoprazole.    If you have not heard from your mail in pharmacy within 1 week or if you have not received your medication in the mail, please contact us at [phone] so we may find out why.  You can decrease your pantoprazole to once daily dosing if tolerated.   The [facility] providers would like to encourage you to use [facility] to communicate with providers for non-urgent requests or questions.  Due to long hold times on the telephone, sending your provider a message by [facility] may be a faster and more efficient way to get a response.  Please allow 48 business hours for a response.  Please remember that this is for non-urgent requests.   Thank you for choosing me and [facility].  Pricilla Riffle. Dagoberto Ligas., MD., Marval Regal

## 2021-06-23 NOTE — Progress Notes (Signed)
° ° °  History of Present Illness: This is a 71 year old male returning for follow-up of GERD.  He states his reflux symptoms are under excellent control.  He has no active gastrointestinal complaints.  EGD 04/2020 - Esophageal plaques were found in the distal esophagus, doubt candidiasis. Biopsied. - Gastritis. Biopsied. - Erythematous mucosa in the antrum. Biopsied. - Normal duodenal bulb. - Duodenal erosions without bleeding.  1. Surgical [P], gastric antrum and gastric body - GASTRIC ANTRAL MUCOSA WITH MILD REACTIVE GASTROPATHY. - GASTRIC OXYNTIC MUCOSA WITH MILD CHRONIC GASTRITIS. - WARTHIN-STARRY STAIN IS NEGATIVE FOR HELICOBACTER PYLORI. 2. Surgical [P], distal esophagus - SQUAMOUS ESOPHAGEAL EPITHELIUM WITH MILD ACUTE AND CHRONIC ESOPHAGITIS. - PAS/F STAIN IS NEGATIVE FOR FUNGAL ELEMENTS.  Current Medications, Allergies, Past Medical History, Past Surgical History, Family History and Social History were reviewed in [employer] record.   Physical Exam: General: Well developed, well nourished, no acute distress Head: Normocephalic and atraumatic Eyes: Sclerae anicteric, EOMI Ears: Normal auditory acuity Mouth: Not examined, mask on during Covid-19 pandemic Lungs: Clear throughout to auscultation Heart: Regular rate and rhythm; no murmurs, rubs or bruits Abdomen: Soft, non tender and non distended. No masses, hepatosplenomegaly or hernias noted. Normal Bowel sounds Rectal: Not done Musculoskeletal: Symmetrical with no gross deformities  Pulses:  Normal pulses noted Extremities: No clubbing, cyanosis, edema or deformities noted Neurological: Alert oriented x 4, grossly nonfocal Psychological:  Alert and cooperative. Normal mood and affect   Assessment and Recommendations:  GERD, mild gastritis.  Decrease to pantoprazole 40 mg po qd. Follow antireflux measures.  If reflux symptoms are not well controlled we will resume pantoprazole 40 mg p.o. twice  daily. REV in 1 year.  CRC screening, average risk.  Consider a 10-year interval screening colonoscopy in February 2026.

## 2021-06-25 DIAGNOSIS — M19012 Primary osteoarthritis, left shoulder: Secondary | ICD-10-CM | POA: Diagnosis not present

## 2021-06-25 DIAGNOSIS — N3281 Overactive bladder: Secondary | ICD-10-CM | POA: Diagnosis not present

## 2021-06-25 DIAGNOSIS — Z96612 Presence of left artificial shoulder joint: Secondary | ICD-10-CM | POA: Diagnosis not present

## 2021-06-26 ENCOUNTER — Telehealth: Payer: Medicare Other

## 2021-06-26 ENCOUNTER — Telehealth: Payer: Self-pay | Admitting: Pharmacist

## 2021-06-26 NOTE — Telephone Encounter (Signed)
°  Care Management   Follow Up Note   06/26/2021 Name: Brendan [name] MRN: [id number] DOB: [birth year]   Referred by: Colon Branch, MD Reason for referral : Appointment   An unsuccessful telephone outreach was attempted today. The patient was referred to the case management team for assistance with care management and care coordination.  LM on VM with my contact number [phone]  Follow Up Plan: The care management team will reach out to the patient again over the next 30 days.   Cherre Robins, PharmD Clinical Pharmacist [name]

## 2021-06-30 NOTE — Chronic Care Management (AMB) (Signed)
°  Care Management   Note  06/30/2021 Name: Brendan [name] MRN: [id number] DOB: Jun 11, 1950  Brendan [name] is a 71 y.o. year old male who is a primary care patient of Colon Branch, MD and is actively engaged with the care management team. I reached out to [name] by phone today to assist with re-scheduling a follow up visit with the Pharmacist  Follow up plan: Telephone appointment with care management team member scheduled for: 07/15/2021  [name], [place], [place] Management  Direct Dial: [phone]

## 2021-07-02 DIAGNOSIS — M19012 Primary osteoarthritis, left shoulder: Secondary | ICD-10-CM | POA: Diagnosis not present

## 2021-07-02 DIAGNOSIS — Z96612 Presence of left artificial shoulder joint: Secondary | ICD-10-CM | POA: Diagnosis not present

## 2021-07-08 ENCOUNTER — Encounter: Payer: Self-pay | Admitting: Gastroenterology

## 2021-07-08 DIAGNOSIS — M19012 Primary osteoarthritis, left shoulder: Secondary | ICD-10-CM | POA: Diagnosis not present

## 2021-07-08 DIAGNOSIS — Z96612 Presence of left artificial shoulder joint: Secondary | ICD-10-CM | POA: Diagnosis not present

## 2021-07-10 ENCOUNTER — Other Ambulatory Visit: Payer: Self-pay | Admitting: Internal Medicine

## 2021-07-10 DIAGNOSIS — Z96612 Presence of left artificial shoulder joint: Secondary | ICD-10-CM | POA: Diagnosis not present

## 2021-07-10 DIAGNOSIS — F341 Dysthymic disorder: Secondary | ICD-10-CM

## 2021-07-10 NOTE — Telephone Encounter (Signed)
Requesting: alprazolam 0.5mg   Contract: 08/07/2020 UDS: 08/07/2020 Last Visit: 03/10/2021 Next Visit: 07/18/2021 Last Refill: 02/26/2021 #180 and 1RF  Please Advise

## 2021-07-10 NOTE — Telephone Encounter (Signed)
Denied, RF to early

## 2021-07-15 ENCOUNTER — Ambulatory Visit (INDEPENDENT_AMBULATORY_CARE_PROVIDER_SITE_OTHER): Payer: Medicare Other | Admitting: Pharmacist

## 2021-07-15 DIAGNOSIS — Z96612 Presence of left artificial shoulder joint: Secondary | ICD-10-CM | POA: Diagnosis not present

## 2021-07-15 DIAGNOSIS — E785 Hyperlipidemia, unspecified: Secondary | ICD-10-CM

## 2021-07-15 DIAGNOSIS — E118 Type 2 diabetes mellitus with unspecified complications: Secondary | ICD-10-CM

## 2021-07-15 DIAGNOSIS — F17200 Nicotine dependence, unspecified, uncomplicated: Secondary | ICD-10-CM

## 2021-07-15 DIAGNOSIS — F341 Dysthymic disorder: Secondary | ICD-10-CM

## 2021-07-15 DIAGNOSIS — N401 Enlarged prostate with lower urinary tract symptoms: Secondary | ICD-10-CM

## 2021-07-15 DIAGNOSIS — I10 Essential (primary) hypertension: Secondary | ICD-10-CM

## 2021-07-15 NOTE — Chronic Care Management (AMB) (Addendum)
Chronic Care Management Pharmacy Note  07/15/2021 Name:  Brendan [name] MRN:  [id number] DOB:  05/31/1951  Subjective: CLEBURNE SAVINI is an 71 y.o. year old male who is a primary patient of Paz, Alda Berthold, MD.  The CCM team was consulted for assistance with disease management and care coordination needs.    Engaged with patient by telephone for follow up visit in response to provider referral for pharmacy case management and/or care coordination services.   Consent to Services:  The patient was given information about Chronic Care Management services, agreed to services, and gave verbal consent prior to initiation of services.  Please see initial visit note for detailed documentation.   Patient Care Team: Colon Branch, MD as PCP - General (Internal Medicine) Rigoberto Noel, MD as Consulting Physician (Pulmonary Disease) Franchot Gallo, MD as Consulting Physician (Urology) Ladene Artist, MD as Consulting Physician (Gastroenterology) Stanford Breed Denice Bors, MD as Consulting Physician (Cardiology) Shawnie Dapper, DO as Consulting Physician (Optometry) Cherre Robins, RPH-CPP (Pharmacist)  Recent office visits: 03/10/2021 - PCP (Dr Larose Kells) - F/U chronic conditions. Started Jardiance 67m daily. Stopped vitamin D 50,000 IU weekly - now taking 1000 IU daily.    Recent consult visits: 06/26/2021 - Urology (Dr DLuberta Robertson Seen for OAB and ED. PSA = 0.42; DRE = normal; D/C solifenacin due to dry mouth; start Myrbetriq 525mdaily 06/24/2021 - Ortho (Dr BrSmitty Cords [facility] F/U left reverse total shoulder arthoplasty. No med changes. F/U 6 weeks 06/23/2021 - GI (Dr StFuller PlanF/U GERD. Decreased pantoprazole to 4032maily  [facility]15/2021 [facility] [facility] 04/29/2021 - Ortho (Cono, PAC - [facility]) discuss shoulder surgery 04/14/2021 - [facility] ([facility]arcot-[facility]-To[facility] Disease. EMG testing done. Abnormal study. There is electrodiagnostic  evidence of a chronic, moderate, axonal sensorimotor polyneuropathy. There is no evidence of myopathy 04/09/2021 - Cardio (Dr CreStanford BreedU CAD and Afib. No med changes. Recommended if next LDL >70, start PCSK9 04/04/2021 - Ortho Surgery (Dr BruSmitty Cordsrep for surgery 04/02/2021 - [facility] left shoulder pain and arthritis of left glenohumeral joint. MRI of left shoulder ordered. Patient to proceed with left shoulder surgery.  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  Genetic testing performed.  01/17/2021 - [facility] ([facility] improved post durgical intervention 07/2020. Left shoulder pain - has not improved with injections. Discussed surgery. Continue with observation for now. F/U 6 months.  [facility]  Objective:  Lab Results  Component Value Date   CREATININE 0.86 11/08/2020   CREATININE 0.9 06/13/2020   CREATININE 0.71 04/03/2020    Lab Results  Component Value Date   HGBA1C 7.0 (H) 03/10/2021   Last diabetic Eye exam:  Lab Results  Component Value Date/Time   HMDIABEYEEXA No Retinopathy 09/18/2020 12:00 AM    Last diabetic Foot exam:  Lab Results  Component Value Date/Time   HMDIABFOOTEX Normal/House Calls 01/30/2021 12:00 AM        Component Value Date/Time   CHOL 149 03/10/2021 1412   CHOL 128 12/14/2018 1120   TRIG 262.0 (H) 03/10/2021 1412   HDL 36.90 (L) 03/10/2021 1412   HDL 29 (L) 12/14/2018 1120   CHOLHDL 4 03/10/2021 1412  VLDL 52.4 (H) 03/10/2021 1412   LDLCALC 70 01/22/2020 1028   LDLCALC 60 12/14/2018 1120   LDLDIRECT 88.0 03/10/2021 1412    Hepatic Function Latest Ref Rng & Units 06/13/2020 04/03/2020 04/01/2020  Total Protein 6.1 - 8.1 g/dL - 6.1 6.4(L)  Albumin 3.5 - 5.0 g/dL - - 3.9  AST 14 - 40 19 21 19   ALT 10 - 40  18 23 21   Alk Phosphatase 25 - 125 89 - 55  Total Bilirubin 0.2 - 1.2 mg/dL - 0.6 0.6  Bilirubin, Direct <=0.2 mg/dL - - -    Lab Results  Component Value Date/Time   TSH 1.47 04/03/2020 03:52 PM   TSH 1.44 08/17/2018 10:38 AM    CBC Latest Ref Rng & Units 11/08/2020 08/07/2020 06/13/2020  WBC 3.8 - 10.8 Thousand/uL 7.3 5.0 6.7  Hemoglobin 13.2 - 17.1 g/dL 14.7 13.2 14.7  Hematocrit 38.5 - 50.0 % 45.1 40.0 46  Platelets 140 - 400 Thousand/uL 249 296.0 225    Lab Results  Component Value Date/Time   VD25OH 29 (L) 11/08/2020 02:02 PM   VD25OH 54.29 12/25/2013 10:23 AM   VD25OH 36 02/12/2011 11:03 AM    Clinical ASCVD: Yes  The 10-year ASCVD risk score (Arnett DK, et al., 2019) is: 37.1%   Values used to calculate the score:     Age: 1 years     Sex: Male     Is Non-Hispanic African American: No     Diabetic: Yes     Tobacco smoker: Yes     Systolic Blood Pressure: 790 mmHg     Is BP treated: Yes     HDL Cholesterol: 36.9 mg/dL     Total Cholesterol: 149 mg/dL     Social History   Tobacco Use  Smoking Status Every Day   Packs/day: 0.50   Years: 51.00   Pack years: 25.50   Types: Cigarettes   Start date: 1970  Smokeless Tobacco Never  Tobacco Comments   Still smoking max of #15/day   BP Readings from Last 3 Encounters:  06/23/21 110/62  04/09/21 128/76  03/10/21 128/64   Pulse Readings from Last 3 Encounters:  06/23/21 64  04/09/21 63  03/10/21 61   Wt Readings from Last 3 Encounters:  06/23/21 266 lb 9.6 oz (120.9 kg)  04/09/21 276 lb (125.2 kg)  03/10/21 278 lb 8 oz (126.3 kg)    Assessment: Review of patient past medical history, allergies, medications, health status, including review of consultants reports, laboratory and other test data, was performed as part of comprehensive evaluation and provision of chronic care management services.   SDOH:  (Social Determinants of Health) assessments and interventions performed:      CCM Care  Plan  Allergies  Allergen Reactions   Tussionex Pennkinetic Er [Hydrocod Poli-Chlorphe Poli Er] Hives    No current issues, only had issues when he was 71 years old   Albuterol Palpitations   Qvar [Beclomethasone] Palpitations    Medications Reviewed Today     Reviewed by Ladene Artist, MD (Physician) on 06/23/21 at 1335  Med List Status: <None>   Medication Order Taking? Sig Documenting Provider Last Dose Status Informant  acetaminophen (TYLENOL) 500 MG tablet [id number] Yes Take 500 mg by mouth every 6 (six) hours as needed. [provider] Taking Active   ALPRAZolam Duanne Moron) 0.5 MG tablet [id number] Yes Take 1 tablet (0.5 mg total) by mouth 2 (two) times daily as needed. for anxiety Colon Branch, MD  Taking Active   atorvastatin (LIPITOR) 80 MG tablet [id number] Yes TAKE 1 TABLET BY MOUTH  DAILY Colon Branch, MD Taking Active   chlorpheniramine (CHLOR-TRIMETON) 4 MG tablet [id number] Yes Take 8 mg by mouth daily as needed for allergies or rhinitis.  [provider] Taking Active   Cholecalciferol (VITAMIN D3) 50 MCG (2000 UT) capsule [id number] Yes Take 1 capsule by mouth daily. [provider] Taking Active   Cobalamin Combinations (B-12) [phone] MCG SUBL [id number] Yes Take 0.5 tablets by mouth daily. [provider] Taking Active   empagliflozin (JARDIANCE) 10 MG TABS tablet [id number] Yes Take 1 tablet (10 mg total) by mouth daily before breakfast. Colon Branch, MD Taking Active   ezetimibe (ZETIA) 10 MG tablet [id number] Yes TAKE 1 TABLET BY MOUTH  DAILY Colon Branch, MD Taking Active   FLUoxetine (PROZAC) 20 MG capsule [id number] Yes TAKE 1 CAPSULE BY MOUTH  DAILY Colon Branch, MD Taking Active   HYDROcodone-acetaminophen (NORCO/VICODIN) 5-325 MG tablet [id number] Yes Take 1 tablet by mouth at bedtime.  Patient taking differently: Take 1 tablet by mouth at bedtime as needed.   Colon Branch, MD Taking Active   Magnesium Oxide 400 MG CAPS [id number] Yes Take 400  mg by mouth daily. [provider] Taking Active   melatonin 5 MG TABS [id number] Yes Take 5 mg by mouth at bedtime as needed. [provider] Taking Active   methocarbamol (ROBAXIN) 500 MG tablet [id number] Yes TAKE 1 TABLET BY MOUTH AT  BEDTIME AS NEEDED FOR  MUSCLE SPASM(S) Colon Branch, MD Taking Active   metoprolol succinate (TOPROL-XL) 50 MG 24 hr tablet [id number] Yes TAKE 1 AND 1/2 TABLETS BY  MOUTH DAILY TAKE WITH OR  IMMEDIATELY FOLLOWING A  MEAL Colon Branch, MD Taking Active   Multiple Vitamin (MULTIVITAMIN) tablet [id number] Yes Take 1 tablet by mouth daily. Calcium, Magnesium, Zinc [provider] Taking Active   pantoprazole (PROTONIX) 40 MG tablet [id number] Yes TAKE 1 TABLET BY MOUTH  TWICE DAILY Ladene Artist, MD Taking Active   rivaroxaban (XARELTO) 20 MG TABS tablet [id number] Yes TAKE 1 TABLET BY MOUTH  DAILY WITH SUPPER Colon Branch, MD Taking Active   sildenafil (VIAGRA) 100 MG tablet [id number] Yes Take 100 mg by mouth daily as needed for erectile dysfunction. [provider] Taking Active   solifenacin (VESICARE) 10 MG tablet [id number] Yes Take 1 tablet (10 mg total) by mouth daily. Franchot Gallo, MD Taking Active             Patient Active Problem List   Diagnosis Date Noted   Insomnia 08/08/2020   Nonintractable episodic headache 12/14/2019   Controlled diabetes mellitus type 2 with complications ([place]) 42/35/3614   COPD mixed type ([place]) 04/18/2018   OSA (obstructive sleep apnea) 10/16/2015   Bruit 04/24/2015   PCP NOTES >>>>>> 02/25/2015   Hx of colonic polyps 06/25/2014   Sinus bradycardia 06/19/2014   Annual physical exam >>>>>>>>>>>>>>>>>> 04/28/2014   Ejection fraction    GERD (gastroesophageal reflux disease) 02/11/2011   Paroxysmal atrial flutter (HCC)    HTN (hypertension)    CAD (coronary artery disease)    Dyslipidemia    Obesity    B12 deficiency    Vitamin D deficiency 05/20/2009   Polyneuropathy 09/10/2008    BACK PAIN, LUMBAR--UDS 08/03/2008   ERECTILE DYSFUNCTION 02/09/2008   BPH (benign prostatic hyperplasia) 02/09/2008   COLONIC POLYPS 07/23/2007   CIGARETTE SMOKER 07/23/2007   Solitary  pulmonary nodule-chest x-ray stable 08-2014, no further schedule imaging 07/23/2007   DIVERTICULOSIS OF COLON 07/23/2007   ANXIETY DEPRESSION 07/08/2007    Immunization History  Administered Date(s) Administered   Fluad Quad(high Dose 65+) 02/10/2019, 03/10/2021   Influenza Split 06/05/2012, 06/20/2013, 04/10/2014   Influenza Whole 04/08/2009, 04/22/2010, 02/11/2011   Influenza, High Dose Seasonal PF 03/02/2016   Influenza-Unspecified 03/28/2015, 03/17/2017, 03/10/2018, 03/14/2020   PFIZER(Purple Top)SARS-COV-2 Vaccination 06/29/2019, 07/19/2019, 03/14/2020, 12/09/2020   Pfizer Covid-19 Vaccine Bivalent Booster 31yr & up 05/06/2021   Pneumococcal Conjugate-13 04/27/2014   Pneumococcal Polysaccharide-23 05/19/2010, 01/22/2020   Td 05/19/2010   Tdap 12/09/2020   Zoster Recombinat (Shingrix) 04/06/2019, 06/18/2019   Zoster, Live 12/25/2013    Conditions to be addressed/monitored: Atrial Fibrillation, CAD, HTN, HLD, Anxiety, and neuropathy; GERD; OSA; vitamin B12 and D deficiencies; BPH with LUTS  There are no care plans that you recently modified to display for this patient.     Medication Assistance:  None needed; Seems patient has partial / full LIS for 2022. Per [facility] his coapy for Xarelto has been $9.85  Patient's preferred pharmacy is:  [facility] - [address]Phone: [phone]: [phone] [facility] - [address]Phone: [phone]: [address] [address]Phone: [phone]:  [phone]   Follow Up:  Patient agrees to Care Plan and Follow-up.  Plan: Telephone follow up appointment with care management team member scheduled for:  3 to 4 months - following adherence.   TCherre Robins PharmD Clinical Pharmacist [phone] I have reviewed and agree with [facility] documentation.  JKathlene November MD

## 2021-07-15 NOTE — Patient Instructions (Addendum)
Brendan [name],  It was a pleasure speaking with you today.  I have attached a summary of our visit today and information about your health goals.    If you have any questions or concerns, please feel free to contact me either at the phone number below or with a MyChart message.   Keep up the good work!  Cherre Robins, PharmD Clinical Pharmacist [place] [phone] (direct line)  [phone] (main office number)  Patient Goals/Self-Care Activities take medications as prescribed reach out to provider with increased or changing symptoms of pain.   Reach out for support in smoking cessation  Patient verbalizes understanding of instructions and care plan provided today and agrees to view in [place]. Active MyChart status confirmed with patient.

## 2021-07-16 ENCOUNTER — Encounter: Payer: Self-pay | Admitting: Internal Medicine

## 2021-07-17 DIAGNOSIS — Z96612 Presence of left artificial shoulder joint: Secondary | ICD-10-CM | POA: Diagnosis not present

## 2021-07-18 ENCOUNTER — Ambulatory Visit (INDEPENDENT_AMBULATORY_CARE_PROVIDER_SITE_OTHER): Payer: Medicare Other | Admitting: Internal Medicine

## 2021-07-18 ENCOUNTER — Encounter: Payer: Self-pay | Admitting: Internal Medicine

## 2021-07-18 VITALS — BP 118/90 | HR 66 | Temp 98.1°F | Ht 78.0 in | Wt 262.6 lb

## 2021-07-18 DIAGNOSIS — Z0000 Encounter for general adult medical examination without abnormal findings: Secondary | ICD-10-CM

## 2021-07-18 DIAGNOSIS — Z79899 Other long term (current) drug therapy: Secondary | ICD-10-CM | POA: Diagnosis not present

## 2021-07-18 DIAGNOSIS — N644 Mastodynia: Secondary | ICD-10-CM | POA: Diagnosis not present

## 2021-07-18 DIAGNOSIS — Z0001 Encounter for general adult medical examination with abnormal findings: Secondary | ICD-10-CM | POA: Diagnosis not present

## 2021-07-18 DIAGNOSIS — F341 Dysthymic disorder: Secondary | ICD-10-CM

## 2021-07-18 DIAGNOSIS — E538 Deficiency of other specified B group vitamins: Secondary | ICD-10-CM

## 2021-07-18 DIAGNOSIS — E118 Type 2 diabetes mellitus with unspecified complications: Secondary | ICD-10-CM

## 2021-07-18 DIAGNOSIS — E559 Vitamin D deficiency, unspecified: Secondary | ICD-10-CM | POA: Diagnosis not present

## 2021-07-18 DIAGNOSIS — I10 Essential (primary) hypertension: Secondary | ICD-10-CM | POA: Diagnosis not present

## 2021-07-18 DIAGNOSIS — E785 Hyperlipidemia, unspecified: Secondary | ICD-10-CM

## 2021-07-18 LAB — COMPREHENSIVE METABOLIC PANEL
ALT: 16 U/L (ref 0–53)
AST: 17 U/L (ref 0–37)
Albumin: 4.2 g/dL (ref 3.5–5.2)
Alkaline Phosphatase: 101 U/L (ref 39–117)
BUN: 11 mg/dL (ref 6–23)
CO2: 27 mEq/L (ref 19–32)
Calcium: 9.6 mg/dL (ref 8.4–10.5)
Chloride: 103 mEq/L (ref 96–112)
Creatinine, Ser: 0.9 mg/dL (ref 0.40–1.50)
GFR: 85.39 mL/min (ref >60.00)
Glucose, Bld: 118 mg/dL — ABNORMAL HIGH (ref 70–99)
Potassium: 4.1 mEq/L (ref 3.5–5.1)
Sodium: 137 mEq/L (ref 135–145)
Total Bilirubin: 0.9 mg/dL (ref 0.2–1.2)
Total Protein: 6.6 g/dL (ref 6.0–8.3)

## 2021-07-18 LAB — CBC WITH DIFFERENTIAL/PLATELET
Basophils Absolute: 0 10*3/uL (ref 0.0–0.1)
Basophils Relative: 0.7 % (ref 0.0–3.0)
Eosinophils Absolute: 0.2 10*3/uL (ref 0.0–0.7)
Eosinophils Relative: 3.4 % (ref 0.0–5.0)
HCT: 46.3 % (ref 39.0–52.0)
Hemoglobin: 15 g/dL (ref 13.0–17.0)
Lymphocytes Relative: 27.9 % (ref 12.0–46.0)
Lymphs Abs: 1.6 10*3/uL (ref 0.7–4.0)
MCHC: 32.4 g/dL (ref 30.0–36.0)
MCV: 81.5 fl (ref 78.0–100.0)
Monocytes Absolute: 0.5 10*3/uL (ref 0.1–1.0)
Monocytes Relative: 8.6 % (ref 3.0–12.0)
Neutro Abs: 3.4 10*3/uL (ref 1.4–7.7)
Neutrophils Relative %: 59.4 % (ref 43.0–77.0)
Platelets: 242 10*3/uL (ref 150.0–400.0)
RBC: 5.67 Mil/uL (ref 4.22–5.81)
RDW: 15.1 % (ref 11.5–15.5)
WBC: 5.7 10*3/uL (ref 4.0–10.5)

## 2021-07-18 LAB — LIPID PANEL
Cholesterol: 120 mg/dL (ref 0–200)
HDL: 30 mg/dL — ABNORMAL LOW (ref >39.00)
LDL Cholesterol: 58 mg/dL (ref 0–99)
NonHDL: 89.79
Total CHOL/HDL Ratio: 4
Triglycerides: 160 mg/dL — ABNORMAL HIGH (ref 0.0–149.0)
VLDL: 32 mg/dL (ref 0.0–40.0)

## 2021-07-18 LAB — MICROALBUMIN / CREATININE URINE RATIO
Creatinine,U: 127.1 mg/dL
Microalb Creat Ratio: 0.6 mg/g (ref 0.0–30.0)
Microalb, Ur: <0.7 mg/dL (ref 0.0–1.9)

## 2021-07-18 LAB — HEMOGLOBIN A1C: Hgb A1c MFr Bld: 6.6 % — ABNORMAL HIGH (ref 4.6–6.5)

## 2021-07-18 LAB — VITAMIN D 25 HYDROXY (VIT D DEFICIENCY, FRACTURES): VITD: 41.58 ng/mL (ref 30.00–100.00)

## 2021-07-18 NOTE — Progress Notes (Signed)
Subjective:    Patient ID: Brendan [name], male    DOB: 1950-06-19, 71 y.o.   MRN: [id number]  DOS:  07/18/2021 Type of visit - description: CPX  In addition to CPX, chronic medical problems were reviewed. I also reviewed notes from neurology, urology. Reports no recent ambulatory CBGs. Normal ambulatory BPs. About 4 weeks ago developed a soreness at the R nipple.  Denies any swelling, redness, niple discharge or warmness. Also for the last few weeks is experiencing numbness at the tip of the R third and fourth fingers.  No neck pain.  Wt Readings from Last 3 Encounters:  07/18/21 262 lb 9.6 oz (119.1 kg)  06/23/21 266 lb 9.6 oz (120.9 kg)  04/09/21 276 lb (125.2 kg)    Review of Systems  Other than above, a 14 point review of systems is negative       Past Medical History:  Diagnosis Date   Allergy    Anxiety and depression    Arthritis    Atrial flutter ([place])    New diagnosis November 10, 2010, rate controlled   B12 deficiency    Blurred vision    episode with confusion, evaluated Dr. Erling Cruz   BPH (benign prostatic hypertrophy)    CAD (coronary artery disease)    stent, 2001 / nuclear, June, 2010, no ischemia   Diverticulosis of colon    Dyslipidemia    ED (erectile dysfunction)    Ejection fraction    GERD (gastroesophageal reflux disease)    pt denies, take omeprazole d/t taking several medications not GERD per pt   History of colonic polyps    HTN (hypertension)    Lumbar back pain    Obesity    OSA (obstructive sleep apnea)    PAF (paroxysmal atrial fibrillation) (HCC)    Peripheral neuropathy    lower extremities   Prediabetes    Pulmonary nodule    Scalp lesion    November, 2011   Sinus bradycardia    Sleep apnea    no c-pap   Stroke ([place])    tia   Tobacco use    hx of   Vitamin D deficiency     Past Surgical History:  Procedure Laterality Date   arm fracture surgery     remotely   cardica stent x2     CLAVICLE SURGERY     remotely    [place] Right 07/11/2020   TONSILLECTOMY     Social History   Socioeconomic History   Marital status: Widowed    Spouse name: Not on file   Number of children: 0   Years of education: Not on file   Highest education level: Not on file  Occupational History   Occupation: retired---[employer]    Employer: [employer]  Tobacco Use   Smoking status: Every Day    Packs/day: 0.50    Years: 51.00    Pack years: 25.50    Types: Cigarettes    Start date: 1970   Smokeless tobacco: Never   Tobacco comments:    Still smoking max of # 10/day  Vaping Use   Vaping Use: Never used  Substance and Sexual Activity   Alcohol use: Yes    Alcohol/week: 4.0 standard drinks    Types: 4 Standard drinks or equivalent per week    Comment: Very rare   Drug use: No   Sexual activity: Not Currently  Other Topics Concern   Not on file  Social History  Narrative   Wife had muscle dystrophy, lost wife ~10/2017   Now lives in a smaller house, by himself    Right handed   Caffeine: 1/2 caf 2-3 large cups coffee everyday   Social Determinants of Health   Financial Resource Strain: Low Risk    Difficulty of Paying Living Expenses: Not very hard  Food Insecurity: Not on file  Transportation Needs: No Transportation Needs   Lack of Transportation (Medical): No   Lack of Transportation (Non-Medical): No  Physical Activity: Insufficiently Active   Days of Exercise per Week: 2 days   Minutes of Exercise per Session: 30 min  Stress: Not on file  Social Connections: Not on file  Intimate Partner Violence: Not on file    Current Outpatient Medications  Medication Instructions   acetaminophen (TYLENOL) 500 mg, Every 6 hours PRN   ALPRAZolam (XANAX) 0.5 mg, Oral, 2 times daily PRN, for anxiety   atorvastatin (LIPITOR) 80 MG tablet TAKE 1 TABLET BY MOUTH  DAILY   chlorpheniramine (CHLOR-TRIMETON) 8 mg, Oral, Daily PRN   Cholecalciferol (VITAMIN D3) 50 MCG (2000 UT)  capsule 1 capsule, Oral, Daily   Cobalamin Combinations (B-12) 4380152639 MCG SUBL 0.5 tablets, Oral, Daily   empagliflozin (JARDIANCE) 10 MG TABS tablet TAKE 1 TABLET BY MOUTH  DAILY BEFORE BREAKFAST   ezetimibe (ZETIA) 10 MG tablet TAKE 1 TABLET BY MOUTH  DAILY   FLUoxetine (PROZAC) 20 MG capsule TAKE 1 CAPSULE BY MOUTH  DAILY   HYDROcodone-acetaminophen (NORCO/VICODIN) 5-325 MG tablet 1 tablet, Oral, Daily at bedtime   Magnesium Oxide 400 mg, Oral, Daily   melatonin 5 mg, Oral, At bedtime PRN   methocarbamol (ROBAXIN) 500 MG tablet TAKE 1 TABLET BY MOUTH AT  BEDTIME AS NEEDED FOR  MUSCLE SPASM(S)   metoprolol succinate (TOPROL-XL) 50 MG 24 hr tablet TAKE 1 AND 1/2 TABLETS BY  MOUTH DAILY TAKE WITH OR  IMMEDIATELY FOLLOWING A  MEAL   mirabegron ER (MYRBETRIQ) 50 mg, Oral, Daily   pantoprazole (PROTONIX) 40 mg, Oral, 2 times daily   rivaroxaban (XARELTO) 20 MG TABS tablet TAKE 1 TABLET BY MOUTH  DAILY WITH SUPPER   sildenafil (VIAGRA) 100 mg, Oral, Daily PRN   vitamin B-12 (CYANOCOBALAMIN) 1,000 mcg, Oral, Daily       Objective:   Physical Exam BP 118/90    Pulse 66    Temp 98.1 F (36.7 C) (Oral)    Ht 6\' 6"  (1.981 m)    Wt 262 lb 9.6 oz (119.1 kg)    SpO2 99%    BMI 30.35 kg/m  General: Well developed, NAD, BMI noted Neck: No  thyromegaly  HEENT:  Normocephalic . Face symmetric, atraumatic Lungs:  CTA B Normal respiratory effort, no intercostal retractions, no accessory muscle use. Breast exam: Nipples normal, breast tissue is very small and seems symmetric. Slt tender on the R.  No redness/warmness at the area. Axillary exam: No LAD's lymphatic system: No LAD's at the neck or axillary areas. Heart: RRR,  no murmur.  Abdomen:  Not distended, soft, non-tender. No rebound or rigidity.   Lower extremities: no pretibial edema bilaterally  Skin: Exposed areas without rash. Not pale. Not jaundice Neurologic:  alert & oriented X3.  Speech normal, gait appropriate for age and  unassisted Strength symmetric and appropriate for age.  Psych: Cognition and judgment appear intact.  Cooperative with normal attention span and concentration.  Behavior appropriate. No anxious or depressed appearing.     Assessment    Assessment  DM (A1C increased to 6.8 on 12/2017) Peripheral neuropathy (no pain, insensitive type) HTN Hyperlipidemia Anxiety depression -- on xanax  Chronic rhinitis:  Sees ENT CV: Dr Stanford Breed --CAD. Stress test okay 05-2015 -- Paroxysmal Atrial fibrillation --TIA --Abdominal bruit: No AAA. Ultrasound 05/2015 PULM:  OSA - + sleep study 03-2015 ,Cpap  intolerant   COPD: smoker, CT chest 12/2017 :changes noted   GU: BPH, Hypogonadism, ED. Sees urology B12 and vitamin D deficiency Pain mngmt: has back-shoulder  pain and neuropathy  [place] switch to methocarbamol 08-2020, on  Vicodin.  Etodolac DC 2016  per Dr Larose Kells d/t concerns regards anticoagulation. Hydrocodone dose needed to be increased Has seen Dr. Erling Cruz several times since 2010:w/u  included SPE, ACE, TSH, B12 (slightly low with a normal methylmalonic acid) 08-2008 had a lumbar spine MRI with multilevel spondylosis. Dr. 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  Doing better with his diet, has lost some weight.  Checking labs. HTN: Reports normal ambulatory BPs, continue metoprolol.  Labs. Hyperlipidemia: On atorvastatin and Zetia labs.   GERD: Saw GI 06/23/2021, they agreed to decrease pantoprazole to once daily but go up to twice daily if needed Neuropathy Charcot-Marie-Tooth: Sees neurology, EMG 04/14/2021: Chronic, moderate, axonal sensorimotor polyneuropathy.  No myopathy. CAD, paroxysmal A-fib: Denies symptoms.  Anticoagulated. ED, OAB : Saw urology 06-25-21,  they recommend to hold Vesicare temporarily, try Myrbetriq Tobacco: Smoking half pack a day,  not ready to quit. B12 and vitamin D deficiency: Recheck vitamin D.  Recommend supplements. R breast pain: Exam is benign, recommend diagnostic B mammogram RTC 4 months    In addition to CPX, address all his chronic medical problems and a new 1 (breast pain) This visit occurred during the SARS-CoV-2 public health emergency.  Safety protocols were in place, including screening questions prior to the visit, additional usage of staff PPE, and extensive cleaning of exam room while observing appropriate contact time as indicated for disinfecting solutions.

## 2021-07-18 NOTE — Patient Instructions (Addendum)
Check the  blood pressure regularly BP GOAL is between 110/65 and  135/85. If it is consistently higher or lower, let me know  Please review your note advance care planning documents (healthcare power of attorney)  Consider bone density test  We are scheduling a mammogram  GO TO THE LAB : Get the blood work     Brendan [name], Brendan [name] back for    a checkup in 4 months

## 2021-07-18 NOTE — Assessment & Plan Note (Signed)
[id number] PNM 13: 04/27/14 PNM 23: 2011, booster 01/22/2020 S/p zoster and shingrix   Covid vax UTD Had a flu shot   CCS: CCS: 07/26/14 with Lucio Edward, MD- Diverticulosis, otherwise normal f/u 10 years Prostate cancer screening: Last visit with urology 06-26-21   Lung cancer screening: last CT 03-2021 Bone health: On PPIs chronically, declining DEXA Labs:  CMP, FLP, CBC, A1c, micro, UDS, vitamin D Diet and exercise: Discussed POA:  recommend to update his ACP if needed

## 2021-07-18 NOTE — Assessment & Plan Note (Signed)
DM: Currently on Jardiance.  Doing better with his diet, has lost some weight.  Checking labs. HTN: Reports normal ambulatory BPs, continue metoprolol.  Labs. Hyperlipidemia: On atorvastatin and Zetia labs.   GERD: Saw GI 06/23/2021, they agreed to decrease pantoprazole to once daily but go up to twice daily if needed Neuropathy Charcot-Marie-Tooth: Sees neurology, EMG 04/14/2021: Chronic, moderate, axonal sensorimotor polyneuropathy.  No myopathy. CAD, paroxysmal A-fib: Denies symptoms.  Anticoagulated. ED, OAB : Saw urology 06-25-21,  they recommend to hold Vesicare temporarily, try Myrbetriq Tobacco: Smoking half pack a day, not ready to quit. B12 and vitamin D deficiency: Recheck vitamin D.  Recommend supplements. R breast pain: Exam is benign, recommend diagnostic B mammogram RTC 4 months

## 2021-07-20 LAB — DM TEMPLATE

## 2021-07-21 LAB — DRUG MONITORING PANEL 375977 , URINE
Alcohol Metabolites: NEGATIVE ng/mL (ref <500)
Alphahydroxyalprazolam: 211 ng/mL — ABNORMAL HIGH (ref <25)
Alphahydroxymidazolam: NEGATIVE ng/mL (ref <50)
Alphahydroxytriazolam: NEGATIVE ng/mL (ref <50)
Aminoclonazepam: NEGATIVE ng/mL (ref <25)
Amphetamines: NEGATIVE ng/mL (ref <500)
Barbiturates: NEGATIVE ng/mL (ref <300)
Benzodiazepines: POSITIVE ng/mL — AB (ref <100)
Cocaine Metabolite: NEGATIVE ng/mL (ref <150)
Desmethyltramadol: NEGATIVE ng/mL (ref <100)
Hydroxyethylflurazepam: NEGATIVE ng/mL (ref <50)
Lorazepam: NEGATIVE ng/mL (ref <50)
Marijuana Metabolite: NEGATIVE ng/mL (ref <20)
Nordiazepam: NEGATIVE ng/mL (ref <50)
Opiates: NEGATIVE ng/mL (ref <100)
Oxazepam: NEGATIVE ng/mL (ref <50)
Oxycodone: NEGATIVE ng/mL (ref <100)
Temazepam: NEGATIVE ng/mL (ref <50)
Tramadol: NEGATIVE ng/mL (ref <100)

## 2021-07-21 LAB — DM TEMPLATE

## 2021-07-24 DIAGNOSIS — Z96612 Presence of left artificial shoulder joint: Secondary | ICD-10-CM | POA: Diagnosis not present

## 2021-07-25 ENCOUNTER — Telehealth: Payer: Self-pay | Admitting: Internal Medicine

## 2021-07-25 NOTE — Telephone Encounter (Signed)
No

## 2021-07-25 NOTE — Telephone Encounter (Signed)
Please advise 

## 2021-07-25 NOTE — Telephone Encounter (Signed)
Pt would like to know if he should fast for ov sched on 11/17/21.  Please advise.

## 2021-07-25 NOTE — Telephone Encounter (Signed)
Mychart message sent.

## 2021-07-29 DIAGNOSIS — Z96612 Presence of left artificial shoulder joint: Secondary | ICD-10-CM | POA: Diagnosis not present

## 2021-08-05 ENCOUNTER — Encounter: Payer: Self-pay | Admitting: Cardiology

## 2021-08-05 ENCOUNTER — Telehealth: Payer: Self-pay | Admitting: Internal Medicine

## 2021-08-05 DIAGNOSIS — E118 Type 2 diabetes mellitus with unspecified complications: Secondary | ICD-10-CM

## 2021-08-05 DIAGNOSIS — E785 Hyperlipidemia, unspecified: Secondary | ICD-10-CM

## 2021-08-05 DIAGNOSIS — F341 Dysthymic disorder: Secondary | ICD-10-CM

## 2021-08-05 DIAGNOSIS — Z96612 Presence of left artificial shoulder joint: Secondary | ICD-10-CM | POA: Diagnosis not present

## 2021-08-05 DIAGNOSIS — I10 Essential (primary) hypertension: Secondary | ICD-10-CM

## 2021-08-06 DIAGNOSIS — Z96612 Presence of left artificial shoulder joint: Secondary | ICD-10-CM | POA: Diagnosis not present

## 2021-08-06 MED ORDER — HYDROCODONE-ACETAMINOPHEN 5-325 MG PO TABS: 1.0000 | ORAL_TABLET | Freq: Every day | ORAL | 0 refills | Status: DC

## 2021-08-06 NOTE — Telephone Encounter (Signed)
PDMP okay noting that he got 5 oxycodone - December.  Prescription sent ?

## 2021-08-06 NOTE — Telephone Encounter (Signed)
Requesting: hydrocodone 5-325mg   ?Contract: 08/07/2020 ?UDS: 07/18/2021 ?Last Visit: 07/18/2021 ?Next Visit: 11/17/2021 ?Last Refill:  08/07/2020 #30 and 0RF ? ?Please Advise ? ?

## 2021-08-08 ENCOUNTER — Other Ambulatory Visit: Payer: Self-pay | Admitting: Internal Medicine

## 2021-08-08 DIAGNOSIS — F341 Dysthymic disorder: Secondary | ICD-10-CM

## 2021-08-08 NOTE — Telephone Encounter (Signed)
Paz Pt  ? ?Requesting: alprazolam  ?Contract: 08/07/2020 ?UDS: 07/18/2021 ?Last Visit: 07/18/2021 ?Next Visit: 11/17/2021 ?Last Refill: 02/26/2021 #180 and 0RF ? ?Please Advise ? ?

## 2021-08-11 LAB — HM DIABETES FOOT EXAM

## 2021-08-12 LAB — HM HEPATITIS C SCREENING LAB: HM Hepatitis Screen: NEGATIVE

## 2021-08-15 DIAGNOSIS — Z96612 Presence of left artificial shoulder joint: Secondary | ICD-10-CM | POA: Diagnosis not present

## 2021-08-18 ENCOUNTER — Ambulatory Visit
Admission: RE | Admit: 2021-08-18 | Discharge: 2021-08-18 | Disposition: A | Discharge status: Home / Self Care | Payer: Medicare Other | Source: Ambulatory Visit | Attending: Internal Medicine | Admitting: Internal Medicine

## 2021-08-18 DIAGNOSIS — N644 Mastodynia: Secondary | ICD-10-CM | POA: Diagnosis not present

## 2021-08-18 DIAGNOSIS — N62 Hypertrophy of breast: Secondary | ICD-10-CM | POA: Diagnosis not present

## 2021-08-19 DIAGNOSIS — Z96612 Presence of left artificial shoulder joint: Secondary | ICD-10-CM | POA: Diagnosis not present

## 2021-08-22 DIAGNOSIS — Z96612 Presence of left artificial shoulder joint: Secondary | ICD-10-CM | POA: Diagnosis not present

## 2021-08-27 ENCOUNTER — Encounter: Payer: Self-pay | Admitting: Internal Medicine

## 2021-08-29 ENCOUNTER — Encounter: Payer: Self-pay | Admitting: Internal Medicine

## 2021-08-29 DIAGNOSIS — Z96612 Presence of left artificial shoulder joint: Secondary | ICD-10-CM | POA: Diagnosis not present

## 2021-09-01 DIAGNOSIS — Z96612 Presence of left artificial shoulder joint: Secondary | ICD-10-CM | POA: Diagnosis not present

## 2021-09-24 DIAGNOSIS — H02834 Dermatochalasis of left upper eyelid: Secondary | ICD-10-CM | POA: Diagnosis not present

## 2021-09-24 DIAGNOSIS — H25811 Combined forms of age-related cataract, right eye: Secondary | ICD-10-CM | POA: Diagnosis not present

## 2021-09-24 DIAGNOSIS — H43813 Vitreous degeneration, bilateral: Secondary | ICD-10-CM | POA: Diagnosis not present

## 2021-09-24 DIAGNOSIS — E119 Type 2 diabetes mellitus without complications: Secondary | ICD-10-CM | POA: Diagnosis not present

## 2021-09-24 DIAGNOSIS — H35033 Hypertensive retinopathy, bilateral: Secondary | ICD-10-CM | POA: Diagnosis not present

## 2021-09-24 LAB — HM DIABETES EYE EXAM

## 2021-09-26 ENCOUNTER — Ambulatory Visit (INDEPENDENT_AMBULATORY_CARE_PROVIDER_SITE_OTHER): Payer: Medicare Other

## 2021-09-26 VITALS — Ht 78.0 in | Wt 247.0 lb

## 2021-09-26 DIAGNOSIS — I25118 Atherosclerotic heart disease of native coronary artery with other forms of angina pectoris: Secondary | ICD-10-CM | POA: Diagnosis not present

## 2021-09-26 DIAGNOSIS — Z596 Low income: Secondary | ICD-10-CM | POA: Diagnosis not present

## 2021-09-26 DIAGNOSIS — J449 Chronic obstructive pulmonary disease, unspecified: Secondary | ICD-10-CM

## 2021-09-26 DIAGNOSIS — Z0000 Encounter for general adult medical examination without abnormal findings: Secondary | ICD-10-CM

## 2021-09-26 NOTE — Progress Notes (Addendum)
? ?Subjective:  ? Brendan [name] is a 71 y.o. male who presents for Medicare Annual/Subsequent preventive examination. ?Virtual Visit via Telephone Note ? ?I connected with  Brendan [name] on 09/26/21 at 10:00 AM EDT by telephone and verified that I am speaking with the correct person using two identifiers. ? ?Location: ?Patient: HOME ?Provider: [facility] ?Persons participating in the virtual visit: patient/Nurse Health Advisor ?  ?I discussed the limitations, risks, security and privacy concerns of performing an evaluation and management service by telephone and the availability of in person appointments. The patient expressed understanding and agreed to proceed. ? ?Interactive audio and video telecommunications were attempted between this nurse and patient, however failed, due to patient having technical difficulties OR patient did not have access to video capability.  We continued and completed visit with audio only. ? ?Some vital signs may be absent or patient reported.  ? ?Chriss Driver, LPN ? ?Review of Systems    ? ?Cardiac Risk Factors include: advanced age (>71mn, >>39women);diabetes mellitus;hypertension;dyslipidemia;male gender;smoking/ tobacco exposure;sedentary lifestyle ? ?   ?Objective:  ?  ?Today's Vitals  ? 09/26/21 1011  ?Weight: 247 lb (112 kg)  ?Height: '6\' 6"'$  (1.981 m)  ? ?Body mass index is 28.54 kg/m?. ? ? ?  09/26/2021  ? 10:35 AM 03/31/2020  ? 11:41 PM 09/22/2019  ?  1:09 PM 09/20/2018  ?  2:21 PM 05/25/2017  ?  2:34 PM 09/13/2016  ? 11:01 AM 05/15/2016  ?  9:25 AM  ?Advanced Directives  ?Does Patient Have a Medical Advance Directive? Yes No Yes Yes Yes Yes Yes  ?Type of [place]amed[place]of [place] will  [place]Living will [place]RegentLiving will [place]Living will [place] will Out of facility DNR (pink MOST or yellow form)  ?Does patient want to make changes to medical  advance directive?   No - Patient declined No - Patient declined   No - Patient declined  ?Copy of [place] Chart? Yes - validated most recent copy scanned in chart (See row information)  No - copy requested No - copy requested Yes No - copy requested   ?Would patient like information on creating a medical advance directive?  Yes (ED - Information included in AVS)       ? ? ?Current Medications (verified) ?Outpatient Encounter Medications as of 09/26/2021  ?Medication Sig  ? acetaminophen (TYLENOL) 500 MG tablet Take 500 mg by mouth every 6 (six) hours as needed.  ? ALPRAZolam (XANAX) 0.5 MG tablet TAKE 1 TABLET BY MOUTH  TWICE DAILY AS NEEDED FOR  ANXIETY  ? atorvastatin (LIPITOR) 80 MG tablet TAKE 1 TABLET BY MOUTH  DAILY  ? BioGaia Probiotic (BIOGAIA/GERBER SOOTHE) LIQD Take 5 drops by mouth daily at 8 pm.  ? chlorpheniramine (CHLOR-TRIMETON) 4 MG tablet Take 8 mg by mouth daily as needed for allergies or rhinitis.   ? Cholecalciferol (VITAMIN D3) 50 MCG (2000 UT) capsule Take 1 capsule by mouth daily.  ? Cobalamin Combinations (B-12) [id number] MCG SUBL Take 0.5 tablets by mouth daily.  ? empagliflozin (JARDIANCE) 10 MG TABS tablet TAKE 1 TABLET BY MOUTH  DAILY BEFORE BREAKFAST  ? ezetimibe (ZETIA) 10 MG tablet TAKE 1 TABLET BY MOUTH  DAILY  ? FLUoxetine (PROZAC) 20 MG capsule TAKE 1 CAPSULE BY MOUTH  DAILY  ? HYDROcodone-acetaminophen (NORCO/VICODIN) 5-325 MG tablet Take 1 tablet by mouth at bedtime.  ? Magnesium Oxide  400 MG CAPS Take 400 mg by mouth daily.  ? melatonin 5 MG TABS Take 5 mg by mouth at bedtime as needed.  ? methocarbamol (ROBAXIN) 500 MG tablet TAKE 1 TABLET BY MOUTH AT  BEDTIME AS NEEDED FOR  MUSCLE SPASM(S)  ? metoprolol succinate (TOPROL-XL) 50 MG 24 hr tablet TAKE 1 AND 1/2 TABLETS BY  MOUTH DAILY TAKE WITH OR  IMMEDIATELY FOLLOWING A  MEAL  ? mirabegron ER (MYRBETRIQ) 50 MG TB24 tablet Take 50 mg by mouth daily.  ? pantoprazole (PROTONIX) 40 MG tablet Take 40 mg by  mouth 2 (two) times daily.  ? PFIZER COVID-19 VAC BIVALENT injection   ? rivaroxaban (XARELTO) 20 MG TABS tablet TAKE 1 TABLET BY MOUTH  DAILY WITH SUPPER  ? sildenafil (VIAGRA) 100 MG tablet Take 100 mg by mouth daily as needed for erectile dysfunction.  ? vitamin B-12 (CYANOCOBALAMIN) 1000 MCG tablet Take 1,000 mcg by mouth daily.  ? ?No facility-administered encounter medications on file as of 09/26/2021.  ? ? ?Allergies (verified) ?Tussionex pennkinetic er [hydrocod poli-chlorphe poli er], Albuterol, and Qvar [beclomethasone]  ? ?History: ?Past Medical History:  ?Diagnosis Date  ? Allergy   ? Anxiety and depression   ? Arthritis   ? Atrial flutter ([place])   ? New diagnosis November 10, 2010, rate controlled  ? B12 deficiency   ? Blurred vision   ? episode with confusion, evaluated Dr. Erling Cruz  ? BPH (benign prostatic hypertrophy)   ? CAD (coronary artery disease)   ? stent, 2001 / nuclear, June, 2010, no ischemia  ? Diverticulosis of colon   ? Dyslipidemia   ? ED (erectile dysfunction)   ? Ejection fraction   ? GERD (gastroesophageal reflux disease)   ? pt denies, take omeprazole d/t taking several medications not GERD per pt  ? History of colonic polyps   ? HTN (hypertension)   ? Lumbar back pain   ? Obesity   ? OSA (obstructive sleep apnea)   ? PAF (paroxysmal atrial fibrillation) ([place])   ? Peripheral neuropathy   ? lower extremities  ? Prediabetes   ? Pulmonary nodule   ? Scalp lesion   ? November, 2011  ? Sinus bradycardia   ? Sleep apnea   ? no c-pap  ? Stroke [facility])   ? tia  ? Tobacco use   ? hx of  ? Vitamin D deficiency   ? ?Past Surgical History:  ?Procedure Laterality Date  ? arm fracture surgery    ? remotely  ? cardica stent x2    ? CLAVICLE SURGERY    ? remotely  ? REVERSE TOTAL SHOULDER ARTHROPLASTY Right 07/11/2020  ? TONSILLECTOMY    ? ?Family History  ?Problem Relation Age of Onset  ? Stroke Mother   ? Alzheimer's disease Mother   ?     M, brother; passed from this   ? Heart failure Father   ? Stroke Father    ? Alzheimer's disease Paternal Aunt   ? Alzheimer's disease Brother   ?     passed from this   ? Alzheimer's disease Maternal Grandmother   ?     passed from this   ? Dementia Brother   ? Colon cancer Neg Hx   ? Esophageal cancer Neg Hx   ? Rectal cancer Neg Hx   ? Stomach cancer Neg Hx   ? Prostate cancer Neg Hx   ? IgA nephropathy Neg Hx   ? Headache Neg Hx   ? ?  Social History  ? ?Socioeconomic History  ? Marital status: Widowed  ?  Spouse name: Not on file  ? Number of children: 0  ? Years of education: Not on file  ? Highest education level: Not on file  ?Occupational History  ? Occupation: retired---[employer]  ?  Employer: [employer]  ?Tobacco Use  ? Smoking status: Every Day  ?  Packs/day: 0.50  ?  Years: 51.00  ?  Pack years: 25.50  ?  Types: Cigarettes  ?  Start date: 1970  ? Smokeless tobacco: Never  ? Tobacco comments:  ?  Still smoking max of # 10/day  ?Vaping Use  ? Vaping Use: Never used  ?Substance and Sexual Activity  ? Alcohol use: Yes  ?  Alcohol/week: 4.0 standard drinks  ?  Types: 4 Standard drinks or equivalent per week  ?  Comment: Very rare  ? Drug use: No  ? Sexual activity: Not Currently  ?Other Topics Concern  ? Not on file  ?Social History Narrative  ? Wife had muscle dystrophy, lost wife ~10/2017  ? Now lives in a smaller house, by himself   ? Right handed  ? Caffeine: 1/2 caf 2-3 large cups coffee everyday  ? ?Social Determinants of Health  ? ?Financial Resource Strain: High Risk  ? Difficulty of Paying Living Expenses: Hard  ?Food Insecurity: No Food Insecurity  ? Worried About Charity fundraiser in the Last Year: Never true  ? Ran Out of Food in the Last Year: Never true  ?Transportation Needs: No Transportation Needs  ? Lack of Transportation (Medical): No  ? Lack of Transportation (Non-Medical): No  ?Physical Activity: Insufficiently Active  ? Days of Exercise per Week: 3 days  ? Minutes of Exercise per Session: 30 min  ?Stress: Stress Concern Present  ? Feeling of  Stress : Rather much  ?Social Connections: Not on file  ? ? ?Tobacco Counseling ?Ready to quit: Not Answered ?Counseling given: Not Answered ?Tobacco comments: Still smoking max of # 10/day ? ? ?Clinical Intake: ?

## 2021-09-26 NOTE — Patient Instructions (Signed)
Brendan [name] , ?Thank you for taking time to come for your Medicare Wellness Visit. I appreciate your ongoing commitment to your health goals. Please review the following plan we discussed and let me know if I can assist you in the future.  ? ?Screening recommendations/referrals: ?Colonoscopy: Done 07/26/2014 Repeat in 10 years ? ?Recommended yearly ophthalmology/optometry visit for glaucoma screening and checkup ?Recommended yearly dental visit for hygiene and checkup ? ?Vaccinations: ?Influenza vaccine: Done 03/10/2021 Repeat annually ? ?Pneumococcal vaccine: Done 04/27/2014, 01/22/2020, 05/19/2010 ?Tdap vaccine: 12/09/2020 Repeat in 10 years ? ?Shingles vaccine: Done 06/18/2019, 04/06/2019, 12/25/2013   ?Covid-19: Done ? ?Advanced directives: Copies scanned into patient's chart. ? ?Conditions/risks identified: Aim for 30 minutes of exercise or brisk walking, 6-8 glasses of water, and 5 servings of fruits and vegetables each day. ? ? ?Next appointment: Follow up in one year for your annual wellness visit. 2024. ? ?Preventive Care 32 Years and Older, Male ? ?Preventive care refers to lifestyle choices and visits with your health care provider that can promote health and wellness. ?What does preventive care include? ?A yearly physical exam. This is also called an annual well check. ?Dental exams once or twice a year. ?Routine eye exams. Ask your health care provider how often you should have your eyes checked. ?Personal lifestyle choices, including: ?Daily care of your teeth and gums. ?Regular physical activity. ?Eating a healthy diet. ?Avoiding tobacco and drug use. ?Limiting alcohol use. ?Practicing safe sex. ?Taking low doses of aspirin every day. ?Taking vitamin and mineral supplements as recommended by your health care provider. ?What happens during an annual well check? ?The services and screenings done by your health care provider during your annual well check will depend on your age, overall health, lifestyle risk  factors, and family history of disease. ?Counseling  ?Your health care provider may ask you questions about your: ?Alcohol use. ?Tobacco use. ?Drug use. ?Emotional well-being. ?Home and relationship well-being. ?Sexual activity. ?Eating habits. ?History of falls. ?Memory and ability to understand (cognition). ?Work and work Statistician. ?Screening  ?You may have the following tests or measurements: ?Height, weight, and BMI. ?Blood pressure. ?Lipid and cholesterol levels. These may be checked every 5 years, or more frequently if you are over 40 years old. ?Skin check. ?Lung cancer screening. You may have this screening every year starting at age 73 if you have a 30-pack-year history of smoking and currently smoke or have quit within the past 15 years. ?Fecal occult blood test (FOBT) of the stool. You may have this test every year starting at age 73. ?Flexible sigmoidoscopy or colonoscopy. You may have a sigmoidoscopy every 5 years or a colonoscopy every 10 years starting at age 73. ?Prostate cancer screening. Recommendations will vary depending on your family history and other risks. ?Hepatitis C blood test. ?Hepatitis B blood test. ?Sexually transmitted disease (STD) testing. ?Diabetes screening. This is done by checking your blood sugar (glucose) after you have not eaten for a while (fasting). You may have this done every 1-3 years. ?Abdominal aortic aneurysm (AAA) screening. You may need this if you are a current or former smoker. ?Osteoporosis. You may be screened starting at age 43 if you are at high risk. ?Talk with your health care provider about your test results, treatment options, and if necessary, the need for more tests. ?Vaccines  ?Your health care provider may recommend certain vaccines, such as: ?Influenza vaccine. This is recommended every year. ?Tetanus, diphtheria, and acellular pertussis (Tdap, Td) vaccine. You may need a Td  booster every 10 years. ?Zoster vaccine. You may need this after age  73. ?Pneumococcal 13-valent conjugate (PCV13) vaccine. One dose is recommended after age 73. ?Pneumococcal polysaccharide (PPSV23) vaccine. One dose is recommended after age 73. ?Talk to your health care provider about which screenings and vaccines you need and how often you need them. ?This information is not intended to replace advice given to you by your health care provider. Make sure you discuss any questions you have with your health care provider. ?Document Released: 06/21/2015 Document Revised: 02/12/2016 Document Reviewed: 03/26/2015 ?Elsevier Interactive Patient Education ? 2017 [place]. ? ?Fall Prevention in the Home ?Falls can cause injuries. They can happen to people of all ages. There are many things you can do to make your home safe and to help prevent falls. ?What can I do on the outside of my home? ?Regularly fix the edges of walkways and driveways and fix any cracks. ?Remove anything that might make you trip as you walk through a door, such as a raised step or threshold. ?Trim any bushes or trees on the path to your home. ?Use bright outdoor lighting. ?Clear any walking paths of anything that might make someone trip, such as rocks or tools. ?Regularly check to see if handrails are loose or broken. Make sure that both sides of any steps have handrails. ?Any raised decks and porches should have guardrails on the edges. ?Have any leaves, snow, or ice cleared regularly. ?Use sand or salt on walking paths during winter. ?Clean up any spills in your garage right away. This includes oil or grease spills. ?What can I do in the bathroom? ?Use night lights. ?Install grab bars by the toilet and in the tub and shower. Do not use towel bars as grab bars. ?Use non-skid mats or decals in the tub or shower. ?If you need to sit down in the shower, use a plastic, non-slip stool. ?Keep the floor dry. Clean up any water that spills on the floor as soon as it happens. ?Remove soap buildup in the tub or shower  regularly. ?Attach bath mats securely with double-sided non-slip rug tape. ?Do not have throw rugs and other things on the floor that can make you trip. ?What can I do in the bedroom? ?Use night lights. ?Make sure that you have a light by your bed that is easy to reach. ?Do not use any sheets or blankets that are too big for your bed. They should not hang down onto the floor. ?Have a firm chair that has side arms. You can use this for support while you get dressed. ?Do not have throw rugs and other things on the floor that can make you trip. ?What can I do in the kitchen? ?Clean up any spills right away. ?Avoid walking on wet floors. ?Keep items that you use a lot in easy-to-reach places. ?If you need to reach something above you, use a strong step stool that has a grab bar. ?Keep electrical cords out of the way. ?Do not use floor polish or wax that makes floors slippery. If you must use wax, use non-skid floor wax. ?Do not have throw rugs and other things on the floor that can make you trip. ?What can I do with my stairs? ?Do not leave any items on the stairs. ?Make sure that there are handrails on both sides of the stairs and use them. Fix handrails that are broken or loose. Make sure that handrails are as long as the stairways. ?Check any carpeting  to make sure that it is firmly attached to the stairs. Fix any carpet that is loose or worn. ?Avoid having throw rugs at the top or bottom of the stairs. If you do have throw rugs, attach them to the floor with carpet tape. ?Make sure that you have a light switch at the top of the stairs and the bottom of the stairs. If you do not have them, ask someone to add them for you. ?What else can I do to help prevent falls? ?Wear shoes that: ?Do not have high heels. ?Have rubber bottoms. ?Are comfortable and fit you well. ?Are closed at the toe. Do not wear sandals. ?If you use a stepladder: ?Make sure that it is fully opened. Do not climb a closed stepladder. ?Make sure that  both sides of the stepladder are locked into place. ?Ask someone to hold it for you, if possible. ?Clearly mark and make sure that you can see: ?Any grab bars or handrails. ?First and last steps. ?Where the edge

## 2021-09-29 ENCOUNTER — Telehealth: Payer: Self-pay | Admitting: *Deleted

## 2021-09-29 NOTE — Chronic Care Management (AMB) (Signed)
?  Chronic Care Management  ? ?Note ? ?09/29/2021 ?Name: ABDULAZIZ TOMAN MRN: [id number] DOB: May 07, 1951 ? ?KAINALU HEGGS is a 71 y.o. year old male who is a primary care patient of Colon Branch, MD. JEARL SOTO is currently enrolled in care management services. An additional referral for Licensed Clinical SW and RNCM  was placed.  ? ?Follow up plan: ?Telephone appointment with care management team member scheduled for: 10/02/2021 and 10/08/2021 ? ?Rozalyn Osland, CCMA ?Care Guide, Embedded Care Coordination ?[facility]  ?Direct Dial: [phone] ? ? ?

## 2021-10-02 ENCOUNTER — Ambulatory Visit (INDEPENDENT_AMBULATORY_CARE_PROVIDER_SITE_OTHER): Payer: Medicare Other

## 2021-10-02 DIAGNOSIS — I10 Essential (primary) hypertension: Secondary | ICD-10-CM

## 2021-10-02 DIAGNOSIS — E785 Hyperlipidemia, unspecified: Secondary | ICD-10-CM

## 2021-10-02 NOTE — Patient Instructions (Addendum)
Visit Information ? ?Thank you for taking time to visit with me today. Please don't hesitate to contact me if I can be of assistance to you before our next scheduled telephone appointment. ? ?Following are the goals we discussed today:  ?Patient Goals/Self-Care Activities: ?Take medications as prescribed   ?Attend all scheduled provider appointments ?Call pharmacy for medication refills 3-7 days in advance of running out of medications ?report new symptoms to your doctor ?eat more whole grains, fruits and vegetables, lean meats and healthy fats ? ?Our next appointment is by telephone on 11/04/21 at 10:45 am ? ?Please call the care guide team at [phone] if you need to cancel or reschedule your appointment.  ? ?If you are experiencing a Mental Health or [place] or need someone to talk to, please call the Suicide and Crisis Lifeline: 988 ?call 1-800-273-TALK (toll free, 24 hour hotline)  ? ?Patient verbalizes understanding of instructions and care plan provided today and agrees to view in [place]. Active MyChart status confirmed with patient.   ? ?Thea Silversmith, RN, MSN, BSN, CCM ?Care Management Coordinator ?[facility] ?[phone]  ? ?Hypertension, Adult ?Hypertension is another name for high blood pressure. High blood pressure forces your heart to work harder to pump blood. This can cause problems over time. ?There are two numbers in a blood pressure reading. There is a top number (systolic) over a bottom number (diastolic). It is best to have a blood pressure that is below 120/80. ?What are the causes? ?The cause of this condition is not known. Some other conditions can lead to high blood pressure. ?What increases the risk? ?Some lifestyle factors can make you more likely to develop high blood pressure: ?Smoking. ?Not getting enough exercise or physical activity. ?Being overweight. ?Having too much fat, sugar, calories, or salt (sodium) in your diet. ?Drinking too much  alcohol. ?Other risk factors include: ?Having any of these conditions: ?Heart disease. ?Diabetes. ?High cholesterol. ?Kidney disease. ?Obstructive sleep apnea. ?Having a family history of high blood pressure and high cholesterol. ?Age. The risk increases with age. ?Stress. ?What are the signs or symptoms? ?High blood pressure may not cause symptoms. Very high blood pressure (hypertensive crisis) may cause: ?Headache. ?Fast or uneven heartbeats (palpitations). ?Shortness of breath. ?Nosebleed. ?Vomiting or feeling like you may vomit (nauseous). ?Changes in how you see. ?Very bad chest pain. ?Feeling dizzy. ?Seizures. ?How is this treated? ?This condition is treated by making healthy lifestyle changes, such as: ?Eating healthy foods. ?Exercising more. ?Drinking less alcohol. ?Your doctor may prescribe medicine if lifestyle changes do not help enough and if: ?Your top number is above 130. ?Your bottom number is above 80. ?Your personal target blood pressure may vary. ?Follow these instructions at home: ?Eating and drinking ? ?If told, follow the DASH eating plan. To follow this plan: ?Fill one half of your plate at each meal with fruits and vegetables. ?Fill one fourth of your plate at each meal with whole grains. Whole grains include whole-wheat pasta, brown rice, and whole-grain bread. ?Eat or drink low-fat dairy products, such as skim milk or low-fat yogurt. ?Fill one fourth of your plate at each meal with low-fat (lean) proteins. Low-fat proteins include fish, chicken without skin, eggs, beans, and tofu. ?Avoid fatty meat, cured and processed meat, or chicken with skin. ?Avoid pre-made or processed food. ?Limit the amount of salt in your diet to less than 1,500 mg each day. ?Do not drink alcohol if: ?Your doctor tells you not to drink. ?You  are pregnant, may be pregnant, or are planning to become pregnant. ?If you drink alcohol: ?Limit how much you have to: ?0-1 drink a day for women. ?0-2 drinks a day for  men. ?Know how much alcohol is in your drink. In the [place] one drink equals one 12 oz bottle of beer (355 mL), one 5 oz glass of wine (148 mL), or one 1? oz glass of hard liquor (44 mL). ?Lifestyle ? ?Work with your doctor to stay at a healthy weight or to lose weight. Ask your doctor what the best weight is for you. ?Get at least 30 minutes of exercise that causes your heart to beat faster (aerobic exercise) most days of the week. This may include walking, swimming, or biking. ?Get at least 30 minutes of exercise that strengthens your muscles (resistance exercise) at least 3 days a week. This may include lifting weights or doing Pilates. ?Do not smoke or use any products that contain nicotine or tobacco. If you need help quitting, ask your doctor. ?Check your blood pressure at home as told by your doctor. ?Keep all follow-up visits. ?Medicines ?Take over-the-counter and prescription medicines only as told by your doctor. Follow directions carefully. ?Do not skip doses of blood pressure medicine. The medicine does not work as well if you skip doses. Skipping doses also puts you at risk for problems. ?Ask your doctor about side effects or reactions to medicines that you should watch for. ?Contact a doctor if: ?You think you are having a reaction to the medicine you are taking. ?You have headaches that keep coming back. ?You feel dizzy. ?You have swelling in your ankles. ?You have trouble with your vision. ?Get help right away if: ?You get a very bad headache. ?You start to feel mixed up (confused). ?You feel weak or numb. ?You feel faint. ?You have very bad pain in your: ?Chest. ?Belly (abdomen). ?You vomit more than once. ?You have trouble breathing. ?These symptoms may be an emergency. Get help right away. Call 911. ?Do not wait to see if the symptoms will go away. ?Do not drive yourself to the hospital. ?Summary ?Hypertension is another name for high blood pressure. ?High blood pressure forces your heart to work  harder to pump blood. ?For most people, a normal blood pressure is less than 120/80. ?Making healthy choices can help lower blood pressure. If your blood pressure does not get lower with healthy choices, you may need to take medicine. ?This information is not intended to replace advice given to you by your health care provider. Make sure you discuss any questions you have with your health care provider. ?Document Revised: 03/13/2021 Document Reviewed: 03/13/2021 ?Elsevier Patient Education ? [place]. ? ?

## 2021-10-02 NOTE — Chronic Care Management (AMB) (Addendum)
?Chronic Care Management  ? ?CCM RN Visit Note ? ?10/02/2021 ?Name: Brendan [name] MRN: [id number] DOB: [birth year] ? ?Subjective: ?Brendan [name] is a 71 y.o. year old male who is a primary care patient of Brendan Branch, MD. The care management team was consulted for assistance with disease management and care coordination needs.   ? ?Engaged with patient by telephone for initial visit in response to provider referral for case management and/or care coordination services.  ? ?Consent to Services:  ?The patient was given information about Chronic Care Management services, agreed to services, and gave verbal consent prior to initiation of services.  Please see initial visit note for detailed documentation.  ? ?Patient agreed to services and verbal consent obtained.  ? ?Assessment: Review of patient past medical history, allergies, medications, health status, including review of consultants reports, laboratory and other test data, was performed as part of comprehensive evaluation and provision of chronic care management services.  ? ?SDOH (Social Determinants of Health) assessments and interventions performed:  ?SDOH Interventions   ? ?Flowsheet Row Most Recent Value  ?SDOH Interventions   ?Food Insecurity Interventions Other (Comment)  [referral to careguide. concerned that going forward, may have difficulty with obtaining food-decrease in social service assistance]  ? ?  ?  ? ?Brendan [name] ? ?Allergies  ?Allergen Reactions  ? Tussionex Pennkinetic Er [Hydrocod Poli-Chlorphe Poli Er] Hives  ?  No current issues, only had issues when he was 71 years old  ? Albuterol Palpitations  ? Qvar [Beclomethasone] Palpitations  ? ? ?Outpatient Encounter Medications as of 10/02/2021  ?Medication Sig  ? acetaminophen (TYLENOL) 500 MG tablet Take 500 mg by mouth every 6 (six) hours as needed.  ? ALPRAZolam (XANAX) 0.5 MG tablet TAKE 1 TABLET BY MOUTH  TWICE DAILY AS NEEDED FOR  ANXIETY  ? atorvastatin (LIPITOR) 80 MG tablet TAKE 1  TABLET BY MOUTH  DAILY  ? chlorpheniramine (CHLOR-TRIMETON) 4 MG tablet Take 8 mg by mouth daily as needed for allergies or rhinitis.   ? Cholecalciferol (VITAMIN D3) 50 MCG (2000 UT) capsule Take 1 capsule by mouth daily.  ? Cobalamin Combinations (B-12) [phone] MCG SUBL Take 0.5 tablets by mouth daily.  ? empagliflozin (JARDIANCE) 10 MG TABS tablet TAKE 1 TABLET BY MOUTH  DAILY BEFORE BREAKFAST  ? ezetimibe (ZETIA) 10 MG tablet TAKE 1 TABLET BY MOUTH  DAILY  ? FLUoxetine (PROZAC) 20 MG capsule TAKE 1 CAPSULE BY MOUTH  DAILY  ? HYDROcodone-acetaminophen (NORCO/VICODIN) 5-325 MG tablet Take 1 tablet by mouth at bedtime.  ? Magnesium Oxide 400 MG CAPS Take 400 mg by mouth daily.  ? melatonin 5 MG TABS Take 5 mg by mouth at bedtime as needed.  ? methocarbamol (ROBAXIN) 500 MG tablet TAKE 1 TABLET BY MOUTH AT  BEDTIME AS NEEDED FOR  MUSCLE SPASM(S)  ? metoprolol succinate (TOPROL-XL) 50 MG 24 hr tablet TAKE 1 AND 1/2 TABLETS BY  MOUTH DAILY TAKE WITH OR  IMMEDIATELY FOLLOWING A  MEAL  ? mirabegron ER (MYRBETRIQ) 50 MG TB24 tablet Take 50 mg by mouth daily.  ? pantoprazole (PROTONIX) 40 MG tablet Take 40 mg by mouth 2 (two) times daily.  ? rivaroxaban (XARELTO) 20 MG TABS tablet TAKE 1 TABLET BY MOUTH  DAILY WITH SUPPER  ? sildenafil (VIAGRA) 100 MG tablet Take 100 mg by mouth daily as needed for erectile dysfunction.  ? vitamin B-12 (CYANOCOBALAMIN) 1000 MCG tablet Take 1,000 mcg by mouth daily.  ? BioGaia Probiotic (BIOGAIA/GERBER SOOTHE)  LIQD Take 5 drops by mouth daily at 8 pm. (Patient not taking: Reported on 10/02/2021)  ? PFIZER COVID-19 VAC BIVALENT injection   ? ?No facility-administered encounter medications on file as of 10/02/2021.  ? ? ?Patient Active Problem List  ? Diagnosis Date Noted  ? Insomnia 08/08/2020  ? Nonintractable episodic headache 12/14/2019  ? Controlled diabetes mellitus type 2 with complications ([place]) 54/98/2641  ? COPD mixed type ([place]) 04/18/2018  ? OSA (obstructive sleep apnea) 10/16/2015   ? Bruit 04/24/2015  ? PCP NOTES >>>>>> 02/25/2015  ? Hx of colonic polyps 06/25/2014  ? Sinus bradycardia 06/19/2014  ? Annual physical exam >>>>>>>>>>>>>>>>>> 04/28/2014  ? Ejection fraction   ? GERD (gastroesophageal reflux disease) 02/11/2011  ? Paroxysmal atrial flutter ([place])   ? HTN (hypertension)   ? CAD (coronary artery disease)   ? Dyslipidemia   ? Obesity   ? B12 deficiency   ? Vitamin D deficiency 05/20/2009  ? Polyneuropathy 09/10/2008  ? BACK PAIN, LUMBAR--UDS 08/03/2008  ? ERECTILE DYSFUNCTION 02/09/2008  ? BPH (benign prostatic hyperplasia) 02/09/2008  ? COLONIC POLYPS 07/23/2007  ? CIGARETTE SMOKER 07/23/2007  ? Solitary pulmonary nodule-chest x-ray stable 08-2014, no further schedule imaging 07/23/2007  ? DIVERTICULOSIS OF Brendan 07/23/2007  ? ANXIETY DEPRESSION 07/08/2007  ? ? ?Conditions to be addressed/monitored:HTN and HLD ? ?Care Plan : RN Care Manager Plan of Care  ?Updates made by Brendan Rued, RN since 10/02/2021 12:00 AM  ?  ? ?Problem: No Plan of Care Established for Managment of Chronic Disease   ?Priority: High  ?  ? ?Long-Range Goal: Development of Plan of Care for Chronic Disease Managment and/or Care coordination needs   ?Start Date: 10/02/2021  ?Expected End Date: 04/03/2022  ?Priority: High  ?Note:   ?Current Barriers: referral from PCP: issues being able to afford bills at this time. Patient with a cardiac history(HTN,CAD, PAF); Dyslipidemia; COPD- but denies any issues or problems with breathing. DM-per MD controlled, A1C 6.6 on 07/18/21. Current smoker. He reports he has the smoking cessation resources and does not want to quit smoking at this time. Patient reports primary concern is finances. He reports he is on a fixed income. He is receiving food stamps, but received a letter stating that they were cut by $219. He states he has been able to pay his bills in the past, however basic utilities cost has risen and his income has decreased and for the first time his bills were more  than he had in his account.  Brendan [name] is scheduled with embedded LCSW on 10/08/21. He is receptive to HTN education.  ?Chronic Disease Management support and education needs related to HTN ? ?RNCM Clinical Goal(s):  ?Patient will verbalize understanding of plan for management of HTN as evidenced by blood pressure within  ?demonstrate ongoing adherence to prescribed treatment plan for HTN as evidenced by BP readings, attending scheduled appointments, taking medication as prescribed  through collaboration with RN Care manager, provider, and care team.  ? ?Interventions: ?1:1 collaboration with primary care provider regarding development and update of comprehensive plan of care as evidenced by provider attestation and co-signature ?Inter-disciplinary care team collaboration (see longitudinal plan of care) ?Evaluation of current treatment plan related to  self management and patient's adherence to plan as established by provider ? ? ?Community Resource Interventions: (Status:  New goal.)  Long Term Goal ?Care Guide referral for food insecurity, community resources ?Collaboration with Care Guide and LCSW  ? ?Hypertension Interventions:  (Status:  New  goal.) Long Term Goal ?Last practice recorded BP readings:  ?BP Readings from Last 3 Encounters:  ?07/18/21 118/90  ?06/23/21 110/62  ?04/09/21 128/76  ?Most recent eGFR/CrCl: No results found for: EGFR  No components found for: CRCL ? ?Evaluation of current treatment plan related to hypertension self management and patient's adherence to plan as established by provider ?Provided education to patient re: stroke prevention, s/s of heart attack and stroke ?Reviewed medications with patient and discussed importance of compliance  ? ?Hyperlipidemia Interventions:  (Status:  New goal.) Long Term Goal ?Medication review performed; medication list updated in electronic medical record.  ?Counseled on importance of regular laboratory monitoring as prescribed ?Provided HLD educational  materials  ? ?Patient Goals/Self-Care Activities: ?Take medications as prescribed   ?Attend all scheduled provider appointments ?Call pharmacy for medication refills 3-7 days in advance of running out

## 2021-10-05 DIAGNOSIS — F17210 Nicotine dependence, cigarettes, uncomplicated: Secondary | ICD-10-CM

## 2021-10-05 DIAGNOSIS — E785 Hyperlipidemia, unspecified: Secondary | ICD-10-CM

## 2021-10-05 DIAGNOSIS — I10 Essential (primary) hypertension: Secondary | ICD-10-CM

## 2021-10-08 ENCOUNTER — Telehealth: Payer: Self-pay

## 2021-10-08 ENCOUNTER — Ambulatory Visit (INDEPENDENT_AMBULATORY_CARE_PROVIDER_SITE_OTHER): Payer: Medicare Other | Admitting: *Deleted

## 2021-10-08 DIAGNOSIS — I10 Essential (primary) hypertension: Secondary | ICD-10-CM

## 2021-10-08 DIAGNOSIS — E118 Type 2 diabetes mellitus with unspecified complications: Secondary | ICD-10-CM

## 2021-10-08 DIAGNOSIS — E785 Hyperlipidemia, unspecified: Secondary | ICD-10-CM

## 2021-10-08 DIAGNOSIS — J449 Chronic obstructive pulmonary disease, unspecified: Secondary | ICD-10-CM

## 2021-10-08 DIAGNOSIS — I25118 Atherosclerotic heart disease of native coronary artery with other forms of angina pectoris: Secondary | ICD-10-CM

## 2021-10-08 DIAGNOSIS — F341 Dysthymic disorder: Secondary | ICD-10-CM

## 2021-10-08 DIAGNOSIS — Z596 Low income: Secondary | ICD-10-CM

## 2021-10-08 NOTE — Telephone Encounter (Signed)
? ?  Telephone encounter was:  Successful.  ?10/08/2021 ?Name: STRIDER VALLANCE MRN: [id number] DOB: 19-Sep-1950 ? ?NAJEEB UPTAIN is a 71 y.o. year old male who is a primary care patient of Larose Kells, Alda Berthold, MD . The community resource team was consulted for assistance with Financial Difficulties related to food and utilities. ? ?Care guide performed the following interventions: Spoke with patient about food pantries and utility assistance.  Patient stated that he was not interested in assistance right now but would like the information emailed to him.  Verified email and sent information to [email or web address]. Letter saved in Epic. ? ?Follow Up Plan:  No further follow up planned at this time. The patient has been provided with needed resources. ? ?Arleny Kruger, [place], CHC ?Care Guide  Embedded Care Coordination ?[facility]  ?[address] ?[place], [place] [address] ???[email or web address]  ?? [phone]   ?[email or web address] ?  ?

## 2021-10-09 ENCOUNTER — Encounter: Payer: Self-pay | Admitting: *Deleted

## 2021-10-09 IMAGING — CT CT CHEST LUNG CANCER SCREENING LOW DOSE W/O CM
2 of 4 series · 15 of 36 positions shown, 18 images · non-contrast
Comparison: Low-dose lung cancer screening chest CT 12/14/2017.

CLINICAL DATA: 68-year-old male current smoker with 50 pack-year
history of smoking. Lung cancer screening examination.

EXAM:
CT CHEST WITHOUT CONTRAST LOW-DOSE FOR LUNG CANCER SCREENING
TECHNIQUE: Multidetector CT imaging of the chest was performed following the
standard protocol without IV contrast.

[Series 4: lungs · axial · 0.83mm/px · z∈[+48,+360]mm · 12 of 344 slices shown, 15 images]
[im 16/344  mediastinal]
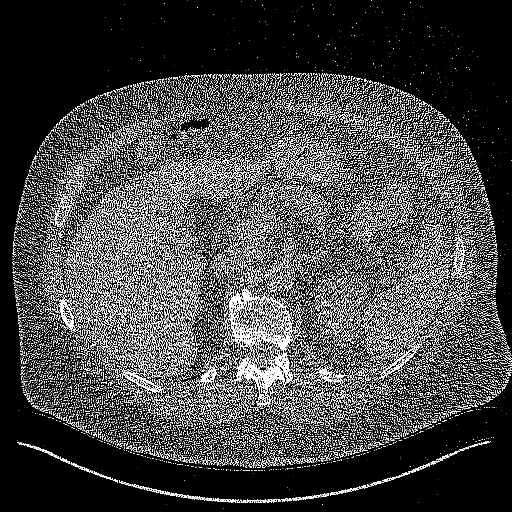
[im 16/344  lung]
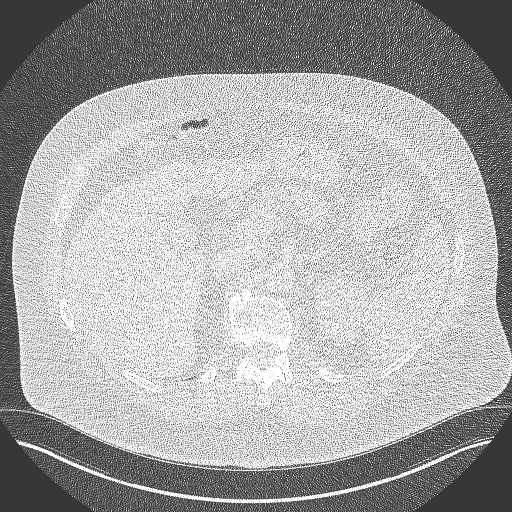
[im 47/344  lung]
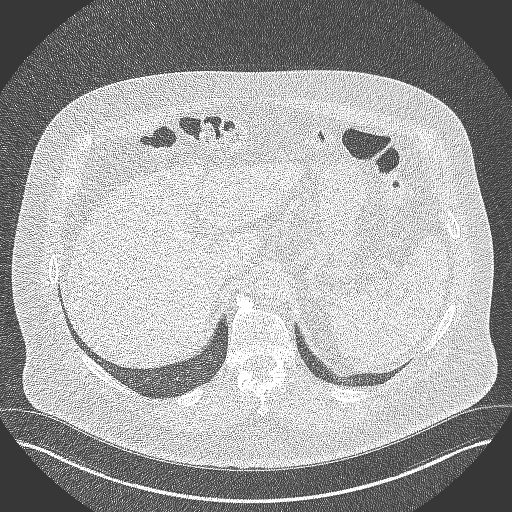
[im 78/344  lung]
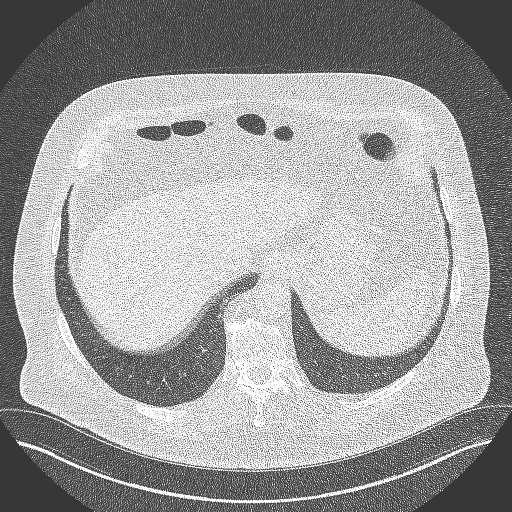
[im 110/344  lung]
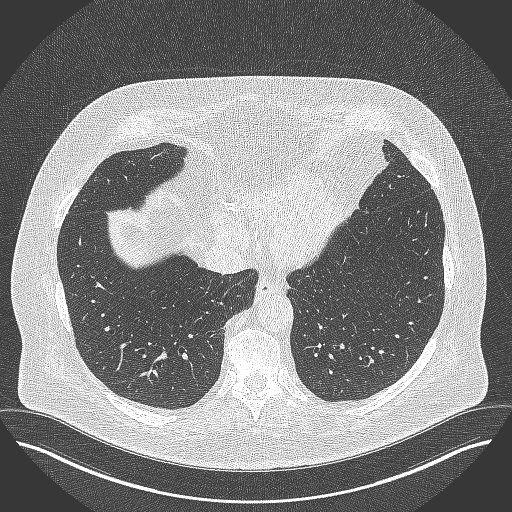
[im 125/344  mediastinal]
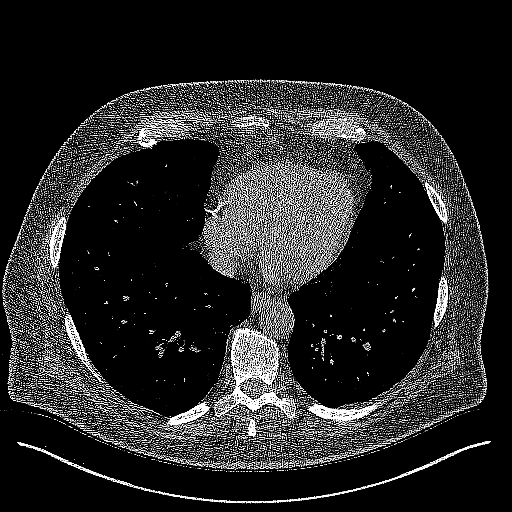
[im 125/344  lung]
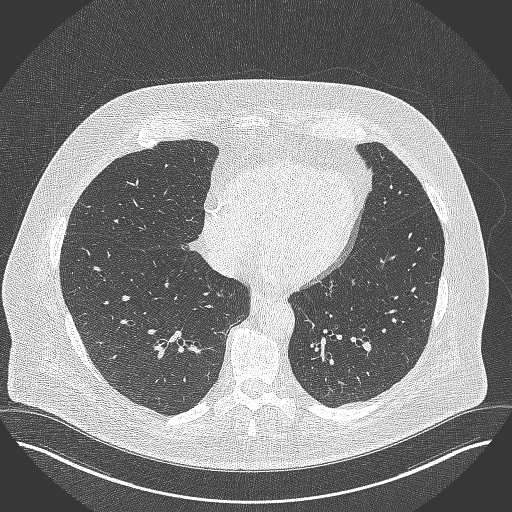
[im 156/344  lung]
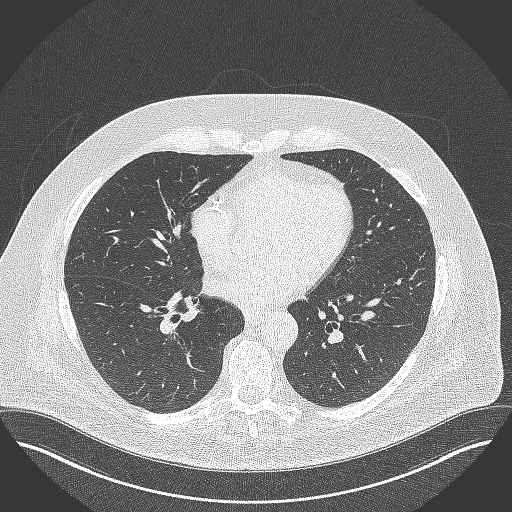
[im 188/344  lung]
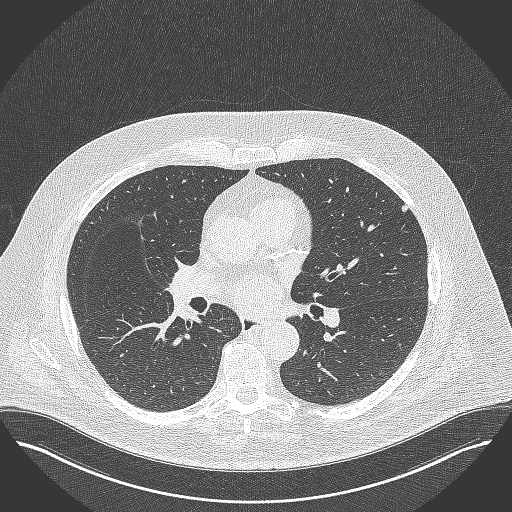
[im 219/344  lung]
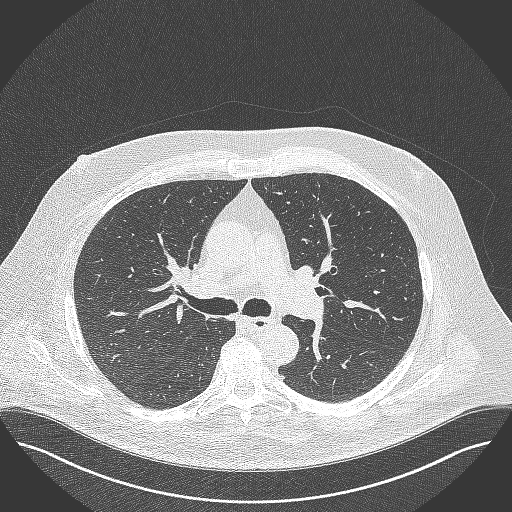
[im 234/344  mediastinal]
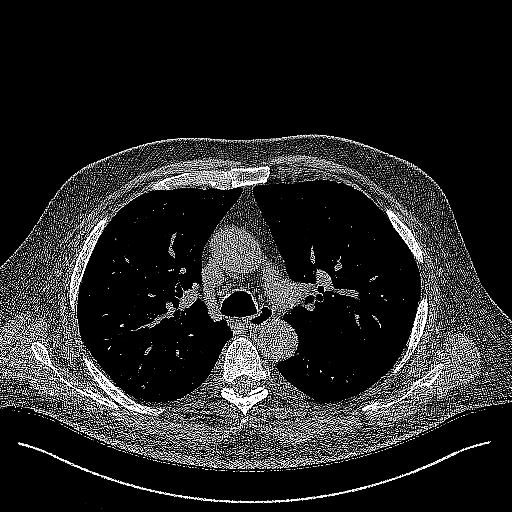
[im 234/344  lung]
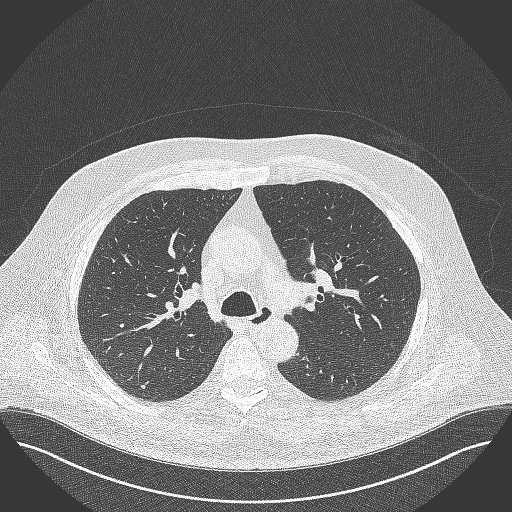
[im 266/344  lung]
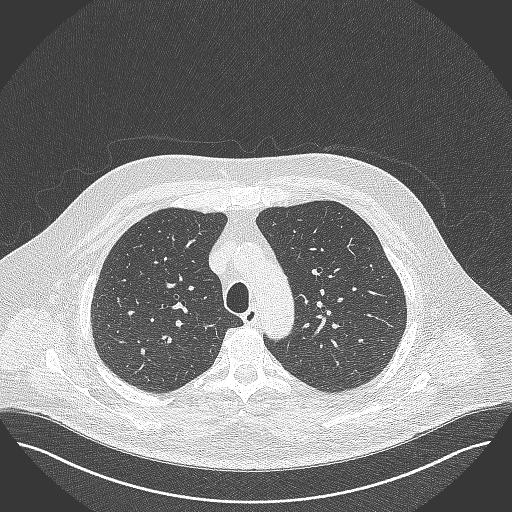
[im 297/344  lung]
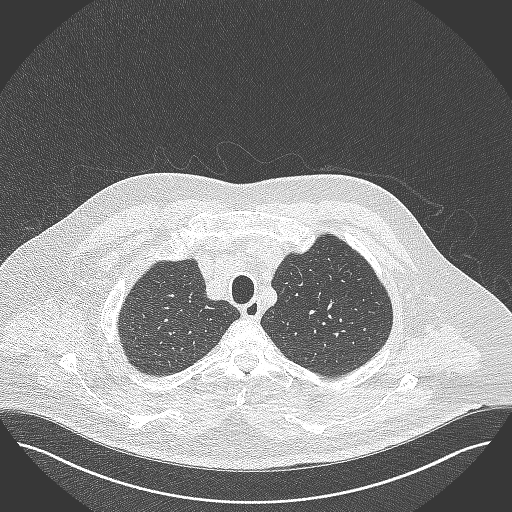
[im 328/344  lung]
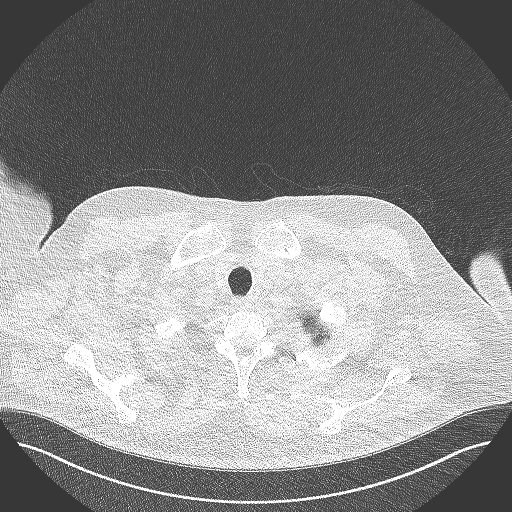

[Series 5: coronal · coronal · 0.69mm/px · 3 of 324 slices shown]
[im 65/324  lung]
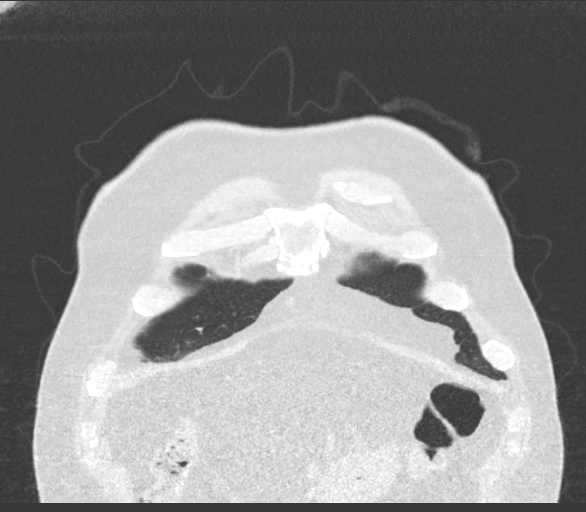
[im 130/324  lung]
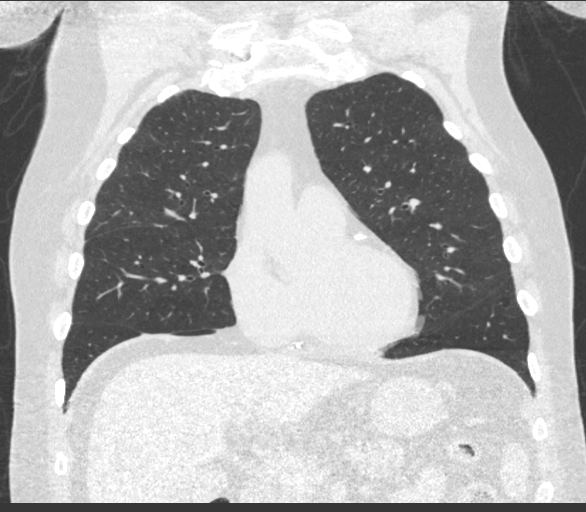
[im 194/324  lung]
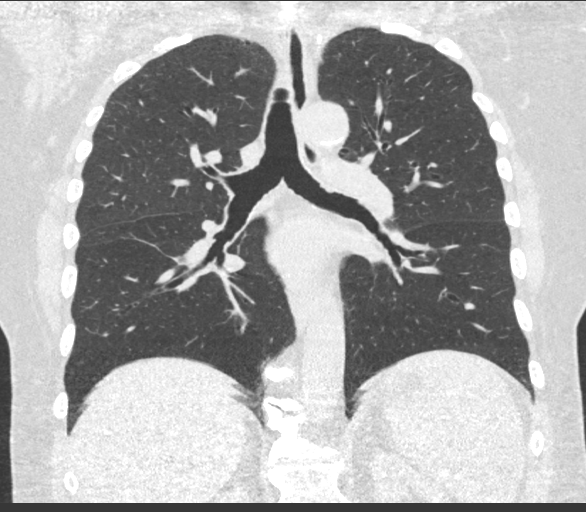

[15 of 36 positions shown; findings below may reference images not displayed]

FINDINGS: Cardiovascular: Heart size is normal. There is no significant
pericardial fluid, thickening or pericardial calcification. There is
aortic atherosclerosis, as well as atherosclerosis of the great
vessels of the mediastinum and the coronary arteries, including
calcified atherosclerotic plaque in the left main, left anterior
descending, left circumflex and right coronary arteries.

Mediastinum/Nodes: No pathologically enlarged mediastinal or hilar
lymph nodes. Please note that accurate exclusion of hilar adenopathy
is limited on noncontrast CT scans. Esophagus is unremarkable in
appearance. No axillary lymphadenopathy.

Lungs/Pleura: Multiple small pulmonary nodules are again noted
throughout the lungs bilaterally, largest of which is in the
periphery of the left upper lobe (axial image 158 of series 3),
where there is a small peripheral nodule with a volume derived mean
diameter 4.8 mm. No other larger more suspicious appearing pulmonary
nodules or masses are noted. No acute consolidative airspace
disease. No pleural effusions. Mild diffuse bronchial wall
thickening with very mild centrilobular and paraseptal emphysema.

Upper Abdomen: Aortic atherosclerosis.

Musculoskeletal: There are no aggressive appearing lytic or blastic
lesions noted in the visualized portions of the skeleton.
IMPRESSION: 1. Lung-RADS 2S, benign appearance or behavior. Continue annual
screening with low-dose chest CT without contrast in 12 months.
2. The "S" modifier above refers to potentially clinically
significant non lung cancer related findings. Specifically, there is
aortic atherosclerosis, in addition to left main and 3 vessel
coronary artery disease. Please note that although the presence of
coronary artery calcium documents the presence of coronary artery
disease, the severity of this disease and any potential stenosis
cannot be assessed on this non-gated CT examination. Assessment for
potential risk factor modification, dietary therapy or pharmacologic
therapy may be warranted, if clinically indicated.
3. Mild diffuse bronchial wall thickening with very mild
centrilobular and paraseptal emphysema; imaging findings suggestive
of underlying COPD.

Aortic Atherosclerosis (KDYFY-SS2.2) and Emphysema (KDYFY-TXL.V).

## 2021-10-09 NOTE — Patient Instructions (Signed)
Visit Information  ? ?Thank you for taking time to visit with me today. Please don't hesitate to contact me if I can be of assistance to you before our next scheduled telephone appointment. ? ?Following are the goals we discussed today:  ?Patient Goals/Self-Care Activities:  ?Begin working with [place], on a bi-weekly basis, in an effort to obtain financial, food, housing, and transportation services and resources. ?Review the following list of community agencies and resources, mailed to your home by LCSW on 10/08/2021, and begin contacting agencies of interest, in an attempt to obtain services: ?~ Section 8 Housing Application ?~ [employer] of [employer] ?~ PG[employer]E [employer] in [place] ?~ B[employer]on[employer] and Company ?~ Pharmacist, hospital Properties ?~ Information You Need to Apply for [place] ?~ Lawyer ?~ [place] for Physicians ?~ [employer] ?~ Development worker, community of [employer] ?~ Land O'Lakes ?~ Resources for Seniors ?~ [place] ?~ Air traffic controller ?~ [place] ?~ The [place], Unisys Corporation, and OfficeMax Incorporated in [place] ?~ The [place] in [place] ?~ Transportation Options ?~ [employer]ands ?~ E. [employer]. [employer] [phone]) ?~ Medicaid Application ?Contact LCSW directly (# [phone]), if you have questions, need assistance, or if additional social work needs are identified between now and our next scheduled telephone outreach call. ?Follow-Up Date:  10/22/2021 at 11:00 am ? ?Please call the care guide team at [phone] if you need to cancel or reschedule your appointment.  ? ?If you are experiencing a Mental Health or  [place] or need someone to talk to, please call the Suicide and Crisis Lifeline: 988 ?call the [place] National Suicide Prevention Lifeline: (201)198-0687 or TTY: 662-853-1193 TTY 916-613-4972) to talk to a trained counselor ?call 1-800-273-TALK (toll free, 24 hour hotline) ?go to [facility] [address], [place] [phone]) ?call the [facility]: [phone] ?call 911  ? ?Following is a copy of your full care plan:  ?Care Plan : [place]  ?Updates made by Francis Gaines, LCSW since 10/09/2021 12:00 AM  ?  ? ?Problem: Probation officer in My Community.   ?Priority: High  ?  ? ?Goal: Probation officer in My Community.   ?Start Date: 10/08/2021  ?Expected End Date: 01/08/2022  ?This Visit's Progress: On track  ?Priority: High  ?Note:   ?Current Barriers:   ?Financial constraints due to fixed income and increase in outstanding medical expenses.   ?Housing barriers, due to inability to afford increase in property taxes for [employer].  ?Lacks knowledge of community resources. ?Clinical Goals:  ?Patient will work with LCSW to address needs related to financial insecurities. ?Interventions:  ?Collaboration with Primary Care Physician, Dr. [name] regarding development and update of comprehensive plan of care, as evidenced by provider attestation and co-signature. ?Inter-disciplinary care team collaboration (see longitudinal plan of care). ?Assessment of needs, barriers, agencies contacted, as well as how impacting.  ?Patient interviewed and appropriate assessments performed. ?Clinical Interventions: ?Reviewed various financial resources, discussed options, and provided patient with Information on How to Apply for Adult Medicaid, through the [place], as well as a Building surveyor of 2023 Medicaid Tips. ?Reviewed various financial resources, discussed options, and provided patient with  Information on  How to Apply for Social Security Disability Benefits, through [employer], as well as a Lawyer, and a Social Security Disability Form for [facility]. ?Reviewed various emergency assistance resources, discussed options, and provided patient with information on Senior Resources of [place], [place] Programs, The M[employer] Company for [employer]iant Energy, [place], and an Land for Nordstrom. ?Reviewed various housing resources, discussed options, and provided patient with information on How to Apply for Section 8 Housing, Senior Housing of [place], [place] in [place], Becton[employer] and Company, and Dealer. ?Reviewed various food assistance resources, discussed options, and provided patient with a Supplemental Nutrition Garment/textile technologist, List of [place] in [place], and The [place] in [place]. ?Reviewed various transportation resources, discussed options, and provided patient with Information on Transportation Options, and How to [employer]. ?Referral to [place] to provide various housing, food, transportation, and financial community agencies and resources, as well as offer support through referrals.    ?Discussed plans with patient for ongoing care management follow-up, and provided patient with direct contact information for care management team. ?Assisted patient with obtaining information about health plan benefits through [employer] and [employer]. ?Additional Clinical Interventions: ?PHQ 2 and PHQ 9 Depression Screen completed, and results reviewed with  patient. ?Solution-Focused Strategies implemented. ?Deep Breathing Exercises, Relaxation Techniques, and Mindfulness Meditation Strategies taught and encouraged, daily. ?Active Listening/Reflection utilized. ?Emotional Support provided. ?Behavioral Activation implemented. ?Problem Solving/Task-Centered Solutions established. ?Brief Cognitive Behavioral Therapy performed. ?Quality of Sleep Assessed, and Sleep Hygiene Techniques promoted. ?Participation in counseling encouraged. ?Participation in Grief and Loss Support Group emphasized. ?Increase in actives/exercise discussed. ?Verbalization of Feelings encouraged. ?Suicidal Ideation/Homicidal Ideation assessed - none present. ?Patient Goals/Self-Care Activities:  ?Begin working with [place], on a bi-weekly basis, in an effort to obtain financial, food, housing, and transportation services and resources. ?Review the following list of community agencies and resources, mailed to your home by LCSW on 10/08/2021, and begin contacting agencies of interest, in an attempt to obtain services: ?~ Section 8 Housing Application ?~ [employer] of [employer] ?~ [employer] Corporation in [place] ?~ B[employer] Company ?~ Pharmacist, hospital Properties ?~ Information You Need to Apply for [place] ?~ Lawyer ?~ [place] for Physicians ?~ EchoStar ?~ Development worker, community of [employer] ?~ Land O'Lakes ?~ Resources for Seniors ?~ [place] ?~ Air traffic controller ?~ [place] ?~ The [place], Unisys Corporation, and OfficeMax Incorporated in [place] ?~ The [place] in [place] ?~ Transportation Options ?~ [employer] Corporation ?~ E. [employer]. du Pont [phone]) ?~ Medicaid Application ?Contact LCSW directly (# [phone]), if you have questions, need assistance, or if additional social work needs are identified between now and our nex

## 2021-10-09 NOTE — Chronic Care Management (AMB) (Signed)
?Chronic Care Management  ? ? Clinical Social Work Note ? ?10/09/2021 ?Name: SLAYDE BRAULT MRN: [id number] DOB: 01-21-1951 ? ?EISEN ROBENSON is a 71 y.o. year old male who is a primary care patient of Paz, Alda Berthold, MD. The CCM team was consulted to assist the patient with chronic disease management and/or care coordination needs related to: Transportation Needs, [employer], [employer]els, and Financial Difficulties.  ? ?Engaged with patient by telephone for initial visit in response to provider referral for social work chronic care management and care coordination services.  ? ?Consent to Services:  ?The patient was given information about Chronic Care Management services, agreed to services, and gave verbal consent prior to initiation of services.  Please see initial visit note for detailed documentation.  ? ?Patient agreed to services and consent obtained.  ? ?Assessment: Review of patient past medical history, allergies, medications, and health status, including review of relevant consultants reports was performed today as part of a comprehensive evaluation and provision of chronic care management and care coordination services.    ? ?SDOH (Social Determinants of Health) assessments and interventions performed:  ?SDOH Interventions   ? ?Flowsheet Row Most Recent Value  ?SDOH Interventions   ?Food Insecurity Interventions Assist with SNAP Application, Other (Comment)  [Provide Resources]  ?Financial Strain Interventions Development worker, community, Other (Comment)  [Financial Resources Provided,  Referral to [place]  ?Housing Interventions Intervention Not Indicated  ?Intimate Partner Violence Interventions Intervention Not Indicated  ?Physical Activity Interventions Patient Refused  ?Stress Interventions Offered [employer], Provide Counseling  ?Social Connections Interventions Intervention Not Indicated  ?Transportation Interventions Intervention Not Indicated  ? ?  ?   ? ?Advanced Directives Status: See Care Plan for related entries. ? ?CCM Care Plan ? ?Allergies  ?Allergen Reactions  ? Tussionex Pennkinetic Er [Hydrocod Poli-Chlorphe Poli Er] Hives  ?  No current issues, only had issues when he was 72 years old  ? Albuterol Palpitations  ? Qvar [Beclomethasone] Palpitations  ? ? ?Outpatient Encounter Medications as of 10/08/2021  ?Medication Sig  ? acetaminophen (TYLENOL) 500 MG tablet Take 500 mg by mouth every 6 (six) hours as needed.  ? ALPRAZolam (XANAX) 0.5 MG tablet TAKE 1 TABLET BY MOUTH  TWICE DAILY AS NEEDED FOR  ANXIETY  ? atorvastatin (LIPITOR) 80 MG tablet TAKE 1 TABLET BY MOUTH  DAILY  ? BioGaia Probiotic (BIOGAIA/GERBER SOOTHE) LIQD Take 5 drops by mouth daily at 8 pm. (Patient not taking: Reported on 10/02/2021)  ? chlorpheniramine (CHLOR-TRIMETON) 4 MG tablet Take 8 mg by mouth daily as needed for allergies or rhinitis.   ? Cholecalciferol (VITAMIN D3) 50 MCG (2000 UT) capsule Take 1 capsule by mouth daily.  ? Cobalamin Combinations (B-12) 909 108 7890 MCG SUBL Take 0.5 tablets by mouth daily.  ? empagliflozin (JARDIANCE) 10 MG TABS tablet TAKE 1 TABLET BY MOUTH  DAILY BEFORE BREAKFAST  ? ezetimibe (ZETIA) 10 MG tablet TAKE 1 TABLET BY MOUTH  DAILY  ? FLUoxetine (PROZAC) 20 MG capsule TAKE 1 CAPSULE BY MOUTH  DAILY  ? HYDROcodone-acetaminophen (NORCO/VICODIN) 5-325 MG tablet Take 1 tablet by mouth at bedtime.  ? Magnesium Oxide 400 MG CAPS Take 400 mg by mouth daily.  ? melatonin 5 MG TABS Take 5 mg by mouth at bedtime as needed.  ? methocarbamol (ROBAXIN) 500 MG tablet TAKE 1 TABLET BY MOUTH AT  BEDTIME AS NEEDED FOR  MUSCLE SPASM(S)  ? metoprolol succinate (TOPROL-XL) 50 MG 24 hr tablet TAKE 1 AND 1/2 TABLETS  BY  MOUTH DAILY TAKE WITH OR  IMMEDIATELY FOLLOWING A  MEAL  ? mirabegron ER (MYRBETRIQ) 50 MG TB24 tablet Take 50 mg by mouth daily.  ? pantoprazole (PROTONIX) 40 MG tablet Take 40 mg by mouth 2 (two) times daily.  ? PFIZER COVID-19 VAC BIVALENT injection   ?  rivaroxaban (XARELTO) 20 MG TABS tablet TAKE 1 TABLET BY MOUTH  DAILY WITH SUPPER  ? sildenafil (VIAGRA) 100 MG tablet Take 100 mg by mouth daily as needed for erectile dysfunction.  ? vitamin B-12 (CYANOCOBALAMIN) 1000 MCG tablet Take 1,000 mcg by mouth daily.  ? ?No facility-administered encounter medications on file as of 10/08/2021.  ? ? ?Patient Active Problem List  ? Diagnosis Date Noted  ? Insomnia 08/08/2020  ? Nonintractable episodic headache 12/14/2019  ? Controlled diabetes mellitus type 2 with complications ([place]) 02/58/5277  ? COPD mixed type ([place]) 04/18/2018  ? OSA (obstructive sleep apnea) 10/16/2015  ? Bruit 04/24/2015  ? PCP NOTES >>>>>> 02/25/2015  ? Hx of colonic polyps 06/25/2014  ? Sinus bradycardia 06/19/2014  ? Annual physical exam >>>>>>>>>>>>>>>>>> 04/28/2014  ? Ejection fraction   ? GERD (gastroesophageal reflux disease) 02/11/2011  ? Paroxysmal atrial flutter ([place])   ? HTN (hypertension)   ? CAD (coronary artery disease)   ? Dyslipidemia   ? Obesity   ? B12 deficiency   ? Vitamin D deficiency 05/20/2009  ? Polyneuropathy 09/10/2008  ? BACK PAIN, LUMBAR--UDS 08/03/2008  ? ERECTILE DYSFUNCTION 02/09/2008  ? BPH (benign prostatic hyperplasia) 02/09/2008  ? COLONIC POLYPS 07/23/2007  ? CIGARETTE SMOKER 07/23/2007  ? Solitary pulmonary nodule-chest x-ray stable 08-2014, no further schedule imaging 07/23/2007  ? DIVERTICULOSIS OF COLON 07/23/2007  ? ANXIETY DEPRESSION 07/08/2007  ? ? ?Conditions to be addressed/monitored:  Hypertension, Coronary Artery Disease, Anxiety, and Depression.  Film/video editor, Limited Social Support, Transport planner, Nurse, adult to [employer], Housing Barriers, and Teacher, English as a foreign language of [employer]. ? ?Care Plan : LCSW Plan of Care  ?Updates made by Francis Gaines, LCSW since 10/09/2021 12:00 AM  ?  ? ?Problem: Probation officer in My Community.   ?Priority: High  ?  ? ?Goal: Probation officer in My Community.   ?Start Date: 10/08/2021  ?Expected  End Date: 01/08/2022  ?This Visit's Progress: On track  ?Priority: High  ?Note:   ?Current Barriers:   ?Financial constraints due to fixed income and increase in outstanding medical expenses.   ?Housing barriers, due to inability to afford increase in property taxes for [employer].  ?Lacks knowledge of community resources. ?Clinical Goals:  ?Patient will work with LCSW to address needs related to financial insecurities. ?Interventions:  ?Collaboration with Primary Care Physician, Dr. [name] regarding development and update of comprehensive plan of care, as evidenced by provider attestation and co-signature. ?Inter-disciplinary care team collaboration (see longitudinal plan of care). ?Assessment of needs, barriers, agencies contacted, as well as how impacting.  ?Patient interviewed and appropriate assessments performed. ?Clinical Interventions: ?Reviewed various financial resources, discussed options, and provided patient with Information on How to Apply for Adult Medicaid, through the [place], as well as a Building surveyor of 2023 Medicaid Tips. ?Reviewed various financial resources, discussed options, and provided patient with Information on How to Apply for [place], through [employer], as well as a Lawyer, and a Social Security Disability Form for [facility]. ?Reviewed various emergency assistance resources, discussed options, and provided patient with information on Senior Resources of [place], [place]  Estée [employer]uder, Resources for Reliant Energy, [place], and an Land for Nord[employer]. ?Reviewed various housing resources, discussed options, and provided patient with information on How to Apply for Section 8 Housing, Senior Housing of [place], [place] in [place], Becton[employer] and Company, and  Dealer. ?Reviewed various food assistance resources, discussed options, and provided patient with a Supplemental Nutrition Assistance Program Application, List of [place]

## 2021-10-13 DIAGNOSIS — K2970 Gastritis, unspecified, without bleeding: Secondary | ICD-10-CM | POA: Diagnosis not present

## 2021-10-13 DIAGNOSIS — K209 Esophagitis, unspecified without bleeding: Secondary | ICD-10-CM | POA: Diagnosis not present

## 2021-10-13 DIAGNOSIS — F17210 Nicotine dependence, cigarettes, uncomplicated: Secondary | ICD-10-CM | POA: Diagnosis not present

## 2021-10-13 DIAGNOSIS — E785 Hyperlipidemia, unspecified: Secondary | ICD-10-CM | POA: Diagnosis not present

## 2021-10-13 DIAGNOSIS — Z9889 Other specified postprocedural states: Secondary | ICD-10-CM | POA: Diagnosis not present

## 2021-10-13 DIAGNOSIS — N3281 Overactive bladder: Secondary | ICD-10-CM | POA: Diagnosis not present

## 2021-10-13 DIAGNOSIS — F32A Depression, unspecified: Secondary | ICD-10-CM | POA: Diagnosis not present

## 2021-10-13 DIAGNOSIS — Z8719 Personal history of other diseases of the digestive system: Secondary | ICD-10-CM | POA: Diagnosis not present

## 2021-10-15 ENCOUNTER — Encounter: Payer: Self-pay | Admitting: Internal Medicine

## 2021-10-22 ENCOUNTER — Ambulatory Visit: Payer: Medicare Other | Admitting: *Deleted

## 2021-10-22 DIAGNOSIS — J449 Chronic obstructive pulmonary disease, unspecified: Secondary | ICD-10-CM

## 2021-10-22 DIAGNOSIS — I10 Essential (primary) hypertension: Secondary | ICD-10-CM

## 2021-10-22 DIAGNOSIS — F341 Dysthymic disorder: Secondary | ICD-10-CM

## 2021-10-22 DIAGNOSIS — E785 Hyperlipidemia, unspecified: Secondary | ICD-10-CM

## 2021-10-22 DIAGNOSIS — E118 Type 2 diabetes mellitus with unspecified complications: Secondary | ICD-10-CM

## 2021-10-22 DIAGNOSIS — I25118 Atherosclerotic heart disease of native coronary artery with other forms of angina pectoris: Secondary | ICD-10-CM

## 2021-10-22 DIAGNOSIS — Z596 Low income: Secondary | ICD-10-CM

## 2021-10-22 NOTE — Patient Instructions (Signed)
Visit Information ? ?Thank you for taking time to visit with me today. Please don't hesitate to contact me if I can be of assistance to you before our next scheduled telephone appointment. ? ?Following are the goals we discussed today:  ?Patient Goals/Self-Care Activities:  ?Continue to work with LCSW, on a bi-weekly basis, in an effort to obtain financial, food, housing, and transportation services and resources. ?Thorough review of the following list of community agencies and resources, and encouragement of contact with agencies of interest, in an attempt to obtain financial assistance.   ?~ Section 8 Housing Application ?~ [place] ?~ PG&E Corporation in [place] ?~ Becton[employer] and Company ?~ Hydrologist and Engineer, building services ?~ Lawyer - Already receiving [place] ?~ Education administrator ?~ Development worker, community of [employer] ?~ Land O'Lakes ?~ Resources for Seniors ?~ [place] ?~ Air traffic controller - Eligible for $70.00 worth of vouchers each month ?~ Teachers Insurance and Annuity Association ?~ The [place], and OfficeMax Incorporated in [place] ?~ The [place] in [place] ?~ Transportation Options ?~ Chief Financial Officer ?~ [employer] - Not eligible due to income guidelines ?Consideration of move to [place] to live in cousin's rental property, free of rent payments each month, to provide caregiver services to brother, currently suffering from Alzheimer's/Dementia. ?Collaboration with [employer] [phone]), in an effort to obtain free or reduced cost prescription glasses. ?Collaboration with representative from the [place], Physicist, medical Division,  to confirm that you have been approved for $70.00 worth of [place] each month. ?Contact LCSW directly (# [id number]), if you have questions, need assistance, or if additional social work needs are identified between now and our next scheduled telephone outreach call. ?Follow-Up Date:  11/06/2021 at 10:30 am ? ?Please call the care guide team at [phone] if you need to cancel or reschedule your appointment.  ? ?If you are experiencing a Mental Health or [place] or need someone to talk to, please call the Suicide and Crisis Lifeline: 988 ?call the [place] National Suicide Prevention Lifeline: 4052969187 or TTY: 346-048-6701 TTY (534)825-6574) to talk to a trained counselor ?call 1-800-273-TALK (toll free, 24 hour hotline) ?go to [facility] [address] [place] [phone]) ?call the [facility]: [phone] ?call 911  ? ?Patient verbalizes understanding of instructions and care plan provided today and agrees to view in [place]. Active MyChart status and patient understanding of how to access instructions and care plan via MyChart confirmed with patient.    ? ?Nat Christen LCSW ?Licensed Clinical Social Worker ?[place] ?[phone]  ?

## 2021-10-22 NOTE — Chronic Care Management (AMB) (Signed)
?Chronic Care Management  ? ? Clinical Social Work Note ? ?10/22/2021 ?Name: Brendan [name] MRN: [id number] DOB: October 05, 1950 ? ?Brendan [name] is a 71 y.o. year old male who is a primary care patient of Paz, Alda Berthold, MD. The CCM team was consulted to assist the patient with chronic disease management and/or care coordination needs related to: Transportation Needs, [employer], Landscape architect, and Medical laboratory scientific officer, and Housing Barriers.  ? ?Engaged with patient by telephone for follow up visit in response to provider referral for social work chronic care management and care coordination services.  ? ?Consent to Services:  ?The patient was given information about Chronic Care Management services, agreed to services, and gave verbal consent prior to initiation of services.  Please see initial visit note for detailed documentation.  ? ?Patient agreed to services and consent obtained.  ? ?Assessment: Review of patient past medical history, allergies, medications, and health status, including review of relevant consultants reports was performed today as part of a comprehensive evaluation and provision of chronic care management and care coordination services.    ? ?SDOH (Social Determinants of Health) assessments and interventions performed:   ? ?Advanced Directives Status: Not addressed in this encounter. ? ?CCM Care Plan ? ?Allergies  ?Allergen Reactions  ? Tussionex Pennkinetic Er [Hydrocod Poli-Chlorphe Poli Er] Hives  ?  No current issues, only had issues when he was 71 years old  ? Albuterol Palpitations  ? Qvar [Beclomethasone] Palpitations  ? ? ?Outpatient Encounter Medications as of 10/22/2021  ?Medication Sig  ? acetaminophen (TYLENOL) 500 MG tablet Take 500 mg by mouth every 6 (six) hours as needed.  ? ALPRAZolam (XANAX) 0.5 MG tablet TAKE 1 TABLET BY MOUTH  TWICE DAILY AS NEEDED FOR  ANXIETY  ? atorvastatin (LIPITOR) 80 MG tablet TAKE 1 TABLET BY MOUTH  DAILY  ? BioGaia Probiotic (BIOGAIA/GERBER  SOOTHE) LIQD Take 5 drops by mouth daily at 8 pm. (Patient not taking: Reported on 10/02/2021)  ? chlorpheniramine (CHLOR-TRIMETON) 4 MG tablet Take 8 mg by mouth daily as needed for allergies or rhinitis.   ? Cholecalciferol (VITAMIN D3) 50 MCG (2000 UT) capsule Take 1 capsule by mouth daily.  ? Cobalamin Combinations (B-12) [phone] MCG SUBL Take 0.5 tablets by mouth daily.  ? empagliflozin (JARDIANCE) 10 MG TABS tablet TAKE 1 TABLET BY MOUTH  DAILY BEFORE BREAKFAST  ? ezetimibe (ZETIA) 10 MG tablet TAKE 1 TABLET BY MOUTH  DAILY  ? FLUoxetine (PROZAC) 20 MG capsule TAKE 1 CAPSULE BY MOUTH  DAILY  ? HYDROcodone-acetaminophen (NORCO/VICODIN) 5-325 MG tablet Take 1 tablet by mouth at bedtime.  ? Magnesium Oxide 400 MG CAPS Take 400 mg by mouth daily.  ? melatonin 5 MG TABS Take 5 mg by mouth at bedtime as needed.  ? methocarbamol (ROBAXIN) 500 MG tablet TAKE 1 TABLET BY MOUTH AT  BEDTIME AS NEEDED FOR  MUSCLE SPASM(S)  ? metoprolol succinate (TOPROL-XL) 50 MG 24 hr tablet TAKE 1 AND 1/2 TABLETS BY  MOUTH DAILY TAKE WITH OR  IMMEDIATELY FOLLOWING A  MEAL  ? mirabegron ER (MYRBETRIQ) 50 MG TB24 tablet Take 50 mg by mouth daily.  ? pantoprazole (PROTONIX) 40 MG tablet Take 40 mg by mouth 2 (two) times daily.  ? PFIZER COVID-19 VAC BIVALENT injection   ? rivaroxaban (XARELTO) 20 MG TABS tablet TAKE 1 TABLET BY MOUTH  DAILY WITH SUPPER  ? sildenafil (VIAGRA) 100 MG tablet Take 100 mg by mouth daily as needed for erectile dysfunction.  ?  vitamin B-12 (CYANOCOBALAMIN) 1000 MCG tablet Take 1,000 mcg by mouth daily.  ? ?No facility-administered encounter medications on file as of 10/22/2021.  ? ? ?Patient Active Problem List  ? Diagnosis Date Noted  ? Insomnia 08/08/2020  ? Nonintractable episodic headache 12/14/2019  ? Controlled diabetes mellitus type 2 with complications ([place]) 77/82/4235  ? COPD mixed type ([place]) 04/18/2018  ? OSA (obstructive sleep apnea) 10/16/2015  ? Bruit 04/24/2015  ? PCP NOTES >>>>>> 02/25/2015  ? Hx of  colonic polyps 06/25/2014  ? Sinus bradycardia 06/19/2014  ? Annual physical exam >>>>>>>>>>>>>>>>>> 04/28/2014  ? Ejection fraction   ? GERD (gastroesophageal reflux disease) 02/11/2011  ? Paroxysmal atrial flutter ([place])   ? HTN (hypertension)   ? CAD (coronary artery disease)   ? Dyslipidemia   ? Obesity   ? B12 deficiency   ? Vitamin D deficiency 05/20/2009  ? Polyneuropathy 09/10/2008  ? BACK PAIN, LUMBAR--UDS 08/03/2008  ? ERECTILE DYSFUNCTION 02/09/2008  ? BPH (benign prostatic hyperplasia) 02/09/2008  ? COLONIC POLYPS 07/23/2007  ? CIGARETTE SMOKER 07/23/2007  ? Solitary pulmonary nodule-chest x-ray stable 08-2014, no further schedule imaging 07/23/2007  ? DIVERTICULOSIS OF COLON 07/23/2007  ? ANXIETY DEPRESSION 07/08/2007  ? ? ?Conditions to be addressed/monitored:  Anxiety and Depression.  Film/video editor, Limited Social Support, Transport planner, Nurse, adult to [employer], Housing Barriers, and Teacher, English as a foreign language of [employer]. ? ?Care Plan : LCSW Plan of Care  ?Updates made by Francis Gaines, LCSW since 10/22/2021 12:00 AM  ?  ? ?Problem: Probation officer in My Community.   ?Priority: High  ?  ? ?Goal: Probation officer in My Community.   ?Start Date: 10/08/2021  ?Expected End Date: 01/08/2022  ?This Visit's Progress: On track  ?Recent Progress: On track  ?Priority: High  ?Note:   ?Current Barriers:   ?Financial constraints due to fixed income and increase in outstanding medical expenses.   ?Housing barriers, due to inability to afford increase in property taxes for [employer].  ?Lacks knowledge of community resources. ?Clinical Goals:  ?Patient will work with LCSW to address needs related to financial insecurities. ?Interventions:  ?Collaboration with Primary Care Physician, Dr. [name] regarding development and update of comprehensive plan of care, as evidenced by provider attestation and co-signature. ?Inter-disciplinary care team collaboration (see longitudinal plan of  care). ?Clinical Interventions: ?Continued review of various financial resources, discussed options, and provided patient with Information on How to Apply for Adult Medicaid, through the [place], as well as a Building surveyor of 2023 Medicaid Tips. ?Continued review of various financial resources, discussed options, and provided patient with Information on How to Apply for [place], through [employer], as well as a Lawyer, and a Social Security Disability Form for [employer]. ?Continued review of various emergency assistance resources, discussed options, and provided patient with information on Senior Resources of [place], [place] Programs, [employer] for [employer], [place], and an Land for [employer]. ?Continued review of various housing resources, discussed options, and provided patient with information on How to Apply for Section 8 Housing, [place] of [place], [place] in [place], [employer], and Dealer. ?Continued review of various food assistance resources, discussed options, and provided patient with a Supplemental Nutrition Garment/textile technologist, List of [place] in [place], and The [place] in [place]. ?Continued review of various transportation resources, discussed options, and provided patient with Information on Transportation Options, and How to [employer]. ?Additional Clinical Interventions: ?Solution-Focused Strategies implemented. ?Deep Breathing Exercises, Relaxation Techniques, and Mindfulness Meditation Strategies  reviewed and encouraged, daily. ?Active Listening/Reflection utilized. ?Emotional Support provided. ?Behavioral Activation implemented. ?Problem Solving/Task-Centered Solutions established. ?Cognitive [employer]

## 2021-10-23 ENCOUNTER — Other Ambulatory Visit: Payer: Self-pay | Admitting: Family Medicine

## 2021-10-23 ENCOUNTER — Other Ambulatory Visit: Payer: Self-pay | Admitting: Internal Medicine

## 2021-10-23 DIAGNOSIS — F341 Dysthymic disorder: Secondary | ICD-10-CM

## 2021-10-24 NOTE — Telephone Encounter (Signed)
Last OV--07/18/2021---PCP is out of the office today. Last RF--#180 on 08/08/21 UDS done on 07/18/2021

## 2021-10-29 ENCOUNTER — Encounter: Payer: Self-pay | Admitting: Internal Medicine

## 2021-10-31 ENCOUNTER — Ambulatory Visit: Payer: Self-pay | Admitting: Pharmacist

## 2021-10-31 NOTE — Chronic Care Management (AMB) (Signed)
Patient is moving to [place] and will have new PCP - Un enrolled from Chronic Care Management program with Clinical Pharmacist Practitioner.

## 2021-11-04 ENCOUNTER — Telehealth: Payer: Medicare Other

## 2021-11-05 DIAGNOSIS — F32A Depression, unspecified: Secondary | ICD-10-CM

## 2021-11-05 DIAGNOSIS — I10 Essential (primary) hypertension: Secondary | ICD-10-CM

## 2021-11-06 ENCOUNTER — Telehealth: Payer: Medicare Other

## 2021-11-06 ENCOUNTER — Encounter: Payer: Self-pay | Admitting: Cardiology

## 2021-11-06 ENCOUNTER — Ambulatory Visit (INDEPENDENT_AMBULATORY_CARE_PROVIDER_SITE_OTHER): Payer: Medicare Other | Admitting: *Deleted

## 2021-11-06 DIAGNOSIS — F341 Dysthymic disorder: Secondary | ICD-10-CM

## 2021-11-06 DIAGNOSIS — Z596 Low income: Secondary | ICD-10-CM

## 2021-11-06 DIAGNOSIS — I25118 Atherosclerotic heart disease of native coronary artery with other forms of angina pectoris: Secondary | ICD-10-CM

## 2021-11-06 DIAGNOSIS — J449 Chronic obstructive pulmonary disease, unspecified: Secondary | ICD-10-CM

## 2021-11-06 DIAGNOSIS — E785 Hyperlipidemia, unspecified: Secondary | ICD-10-CM

## 2021-11-06 DIAGNOSIS — E118 Type 2 diabetes mellitus with unspecified complications: Secondary | ICD-10-CM

## 2021-11-06 DIAGNOSIS — I10 Essential (primary) hypertension: Secondary | ICD-10-CM

## 2021-11-06 NOTE — Chronic Care Management (AMB) (Signed)
Chronic Care Management    Clinical Social Work Note  11/06/2021 Name: Brendan [name] MRN: [id number] DOB: 1951-01-02  Brendan [name] is a 71 y.o. year old male who is a primary care patient of Colon Branch, MD. The CCM team was consulted to assist the patient with chronic disease management and/or care coordination needs related to: [employer], Landscape architect, Mental Health Counseling and Resources, and Financial Difficulties.   Engaged with patient by telephone for follow up visit in response to provider referral for social work chronic care management and care coordination services.   Consent to Services:  The patient was given information about Chronic Care Management services, agreed to services, and gave verbal consent prior to initiation of services.  Please see initial visit note for detailed documentation.   Patient agreed to services and consent obtained.   Assessment: Review of patient past medical history, allergies, medications, and health status, including review of relevant consultants reports was performed today as part of a comprehensive evaluation and provision of chronic care management and care coordination services.     SDOH (Social Determinants of Health) assessments and interventions performed:    Advanced Directives Status: Not addressed in this encounter.  CCM Care Plan  Allergies  Allergen Reactions   Tussionex Pennkinetic Er [Hydrocod Poli-Chlorphe Poli Er] Hives    No current issues, only had issues when he was 71 years old   Albuterol Palpitations   Qvar [Beclomethasone] Palpitations    Outpatient Encounter Medications as of 11/06/2021  Medication Sig   acetaminophen (TYLENOL) 500 MG tablet Take 500 mg by mouth every 6 (six) hours as needed.   ALPRAZolam (XANAX) 0.5 MG tablet TAKE 1 TABLET BY MOUTH TWICE  DAILY AS NEEDED FOR ANXIETY   atorvastatin (LIPITOR) 80 MG tablet TAKE 1 TABLET BY MOUTH  DAILY   BioGaia Probiotic (BIOGAIA/GERBER SOOTHE)  LIQD Take 5 drops by mouth daily at 8 pm. (Patient not taking: Reported on 10/02/2021)   chlorpheniramine (CHLOR-TRIMETON) 4 MG tablet Take 8 mg by mouth daily as needed for allergies or rhinitis.    Cholecalciferol (VITAMIN D3) 50 MCG (2000 UT) capsule Take 1 capsule by mouth daily.   Cobalamin Combinations (B-12) [phone] MCG SUBL Take 0.5 tablets by mouth daily.   empagliflozin (JARDIANCE) 10 MG TABS tablet TAKE 1 TABLET BY MOUTH  DAILY BEFORE BREAKFAST   ezetimibe (ZETIA) 10 MG tablet TAKE 1 TABLET BY MOUTH  DAILY   FLUoxetine (PROZAC) 20 MG capsule TAKE 1 CAPSULE BY MOUTH  DAILY   HYDROcodone-acetaminophen (NORCO/VICODIN) 5-325 MG tablet Take 1 tablet by mouth at bedtime.   Magnesium Oxide 400 MG CAPS Take 400 mg by mouth daily.   melatonin 5 MG TABS Take 5 mg by mouth at bedtime as needed.   methocarbamol (ROBAXIN) 500 MG tablet TAKE 1 TABLET BY MOUTH AT  BEDTIME AS NEEDED FOR  MUSCLE SPASM(S)   metoprolol succinate (TOPROL-XL) 50 MG 24 hr tablet TAKE 1 AND 1/2 TABLETS BY  MOUTH DAILY TAKE WITH OR  IMMEDIATELY FOLLOWING A  MEAL   mirabegron ER (MYRBETRIQ) 50 MG TB24 tablet Take 50 mg by mouth daily.   pantoprazole (PROTONIX) 40 MG tablet Take 40 mg by mouth 2 (two) times daily.   PFIZER COVID-19 VAC BIVALENT injection    rivaroxaban (XARELTO) 20 MG TABS tablet TAKE 1 TABLET BY MOUTH  DAILY WITH SUPPER   sildenafil (VIAGRA) 100 MG tablet Take 100 mg by mouth daily as needed for erectile dysfunction.   vitamin  B-12 (CYANOCOBALAMIN) 1000 MCG tablet Take 1,000 mcg by mouth daily.   No facility-administered encounter medications on file as of 11/06/2021.    Patient Active Problem List   Diagnosis Date Noted   Insomnia 08/08/2020   Nonintractable episodic headache 12/14/2019   Controlled diabetes mellitus type 2 with complications ([place]) 84/69/6295   COPD mixed type ([place]) 04/18/2018   OSA (obstructive sleep apnea) 10/16/2015   Bruit 04/24/2015   PCP NOTES >>>>>> 02/25/2015   Hx of colonic  polyps 06/25/2014   Sinus bradycardia 06/19/2014   Annual physical exam >>>>>>>>>>>>>>>>>> 04/28/2014   Ejection fraction    GERD (gastroesophageal reflux disease) 02/11/2011   Paroxysmal atrial flutter (HCC)    HTN (hypertension)    CAD (coronary artery disease)    Dyslipidemia    Obesity    B12 deficiency    Vitamin D deficiency 05/20/2009   Polyneuropathy 09/10/2008   BACK PAIN, LUMBAR--UDS 08/03/2008   ERECTILE DYSFUNCTION 02/09/2008   BPH (benign prostatic hyperplasia) 02/09/2008   COLONIC POLYPS 07/23/2007   CIGARETTE SMOKER 07/23/2007   Solitary pulmonary nodule-chest x-ray stable 08-2014, no further schedule imaging 07/23/2007   DIVERTICULOSIS OF COLON 07/23/2007   ANXIETY DEPRESSION 07/08/2007    Conditions to be addressed/monitored: Anxiety and Depression.  Film/video editor, Housing Barriers, Mental Health Concerns, Social Isolation, and Lacks Knowledge of [employer].  Care Plan : LCSW Plan of Care  Updates made by Francis Gaines, LCSW since 11/06/2021 12:00 AM     Problem: [place] in My Community. Resolved 11/06/2021  Priority: High     Goal: Obtain Financial Assistance in My Community. Completed 11/06/2021  Start Date: 10/08/2021  Expected End Date: 11/06/2021  This Visit's Progress: On track  Recent Progress: On track  Priority: High  Note:   Current Barriers:   Financial constraints due to fixed income and increase in outstanding medical expenses.   Housing barriers, due to inability to afford increase in property taxes for [employer].  Lacks knowledge of community resources. Clinical Goals:  Patient will work with LCSW to address needs related to financial insecurities. Interventions:  Collaboration with Primary Care Physician, Dr. [name] regarding development and update of comprehensive plan of care, as evidenced by provider attestation and co-signature. Inter-disciplinary care team collaboration (see longitudinal plan of  care). Additional Clinical Interventions: Solution-Focused Strategies implemented. Behavioral Activation implemented. Problem Solving/Task-Centered Solutions established. Cognitive Behavioral Therapy performed. Patient Goals/Self-Care Activities:  Congratulations on your decision to move to [place] to live in cousin's rental property, free of rent payments each month, to provide caregiver services to your brother, currently suffering from End Stage Alzheimer's/Dementia. Contact LCSW directly (# [phone]), if you have questions, need assistance, or if additional social work needs are identified in the near future.   No Follow-Up Required.   [place] Licensed Clinical Social Worker [facility] [phone]

## 2021-11-06 NOTE — Patient Instructions (Signed)
Visit Information  Thank you for taking time to visit with me today. Please don't hesitate to contact me if I can be of assistance to you before our next scheduled telephone appointment.  Following are the goals we discussed today:  Patient Goals/Self-Care Activities:  Congratulations on your decision to move to [place] to live in [employer] property, free of rent payments each month, to provide caregiver services to your brother, currently suffering from End Stage Alzheimer's/Dementia. Contact LCSW directly (# [phone]), if you have questions, need assistance, or if additional social work needs are identified in the near future.   No Follow-Up Required.  Please call the care guide team at [phone] if you need to cancel or reschedule your appointment.   If you are experiencing a Mental Health or [place] or need someone to talk to, please call the Suicide and Crisis Lifeline: 988 call the [place] National Suicide Prevention Lifeline: 6010955837 or TTY: 205 679 0608 TTY [phone]) to talk to a trained counselor call 1-800-273-TALK (toll free, 24 hour hotline) go to [facility] [address], [place] [phone]) call the [place]: [phone] call 911  Patient verbalizes understanding of instructions and care plan provided today and agrees to view in [place]. Active MyChart status and patient understanding of how to access instructions and care plan via MyChart confirmed with patient.     [employer] Licensed Clinical Social Worker [facility] [employer] [facility] [phone]

## 2021-11-07 DIAGNOSIS — Z96612 Presence of left artificial shoulder joint: Secondary | ICD-10-CM | POA: Diagnosis not present

## 2021-11-17 ENCOUNTER — Ambulatory Visit: Payer: Medicare Other | Admitting: Internal Medicine

## 2021-11-25 ENCOUNTER — Ambulatory Visit (INDEPENDENT_AMBULATORY_CARE_PROVIDER_SITE_OTHER): Payer: Medicare Other | Admitting: Internal Medicine

## 2021-11-25 ENCOUNTER — Encounter: Payer: Self-pay | Admitting: Internal Medicine

## 2021-11-25 VITALS — BP 126/62 | HR 62 | Temp 97.7°F | Resp 18 | Ht 78.0 in | Wt 245.5 lb

## 2021-11-25 DIAGNOSIS — E118 Type 2 diabetes mellitus with unspecified complications: Secondary | ICD-10-CM

## 2021-11-25 DIAGNOSIS — I10 Essential (primary) hypertension: Secondary | ICD-10-CM

## 2021-11-25 NOTE — Patient Instructions (Signed)
I am glad you are feeling well.  I wish you luck in [facility].  I am sure you are going to be fine.   Check the  blood pressure regularly BP GOAL is between 110/65 and  135/85. If it is consistently higher or lower, let me know     GO TO THE LAB : Get the blood work

## 2021-11-25 NOTE — Progress Notes (Unsigned)
Subjective:    Patient ID: Brendan [name], male    DOB: [birth year], 71 y.o.   MRN: [id number]  DOS:  11/25/2021 Type of visit - description: Follow-up  Since the last visit reports he is feeling well. I noted weight loss, he reports that he has been much more active lately due to moving to [facility]. He is also eating much healthier. He feels that his energy level has improved.  Denies chest pain or difficulty breathing. No lower extremity edema CMT symptoms are @ baseline   Wt Readings from Last 3 Encounters:  11/25/21 245 lb 8 oz (111.4 kg)  09/26/21 247 lb (112 kg)  07/18/21 262 lb 9.6 oz (119.1 kg)    Review of Systems See above   Past Medical History:  Diagnosis Date   Allergy    Anxiety and depression    Arthritis    Atrial flutter ([place])    New diagnosis November 10, 2010, rate controlled   B12 deficiency    Blurred vision    episode with confusion, evaluated Dr. Erling Cruz   BPH (benign prostatic hypertrophy)    CAD (coronary artery disease)    stent, 2001 / nuclear, June, 2010, no ischemia   Diverticulosis of colon    Dyslipidemia    ED (erectile dysfunction)    Ejection fraction    GERD (gastroesophageal reflux disease)    pt denies, take omeprazole d/t taking several medications not GERD per pt   History of colonic polyps    HTN (hypertension)    Lumbar back pain    Obesity    OSA (obstructive sleep apnea)    PAF (paroxysmal atrial fibrillation) (HCC)    Peripheral neuropathy    lower extremities   Prediabetes    Pulmonary nodule    Scalp lesion    November, 2011   Sinus bradycardia    Sleep apnea    no c-pap   Stroke ([place])    tia   Tobacco use    hx of   Vitamin D deficiency     Past Surgical History:  Procedure Laterality Date   arm fracture surgery     remotely   cardica stent x2     CLAVICLE SURGERY     remotely   [place] Right 07/11/2020   TONSILLECTOMY      Current Outpatient Medications   Medication Instructions   acetaminophen (TYLENOL) 500 mg, Oral, Every 6 hours PRN   ALPRAZolam (XANAX) 0.5 MG tablet TAKE 1 TABLET BY MOUTH TWICE  DAILY AS NEEDED FOR ANXIETY   atorvastatin (LIPITOR) 80 MG tablet TAKE 1 TABLET BY MOUTH  DAILY   BioGaia Probiotic (BIOGAIA/GERBER SOOTHE) LIQD 5 drops, Daily   chlorpheniramine (CHLOR-TRIMETON) 8 mg, Oral, Daily PRN   Cholecalciferol (VITAMIN D3) 50 MCG (2000 UT) capsule 1 capsule, Oral, Daily   Cobalamin Combinations (B-12) [phone] MCG SUBL 0.5 tablets, Oral, Daily   empagliflozin (JARDIANCE) 10 MG TABS tablet TAKE 1 TABLET BY MOUTH  DAILY BEFORE BREAKFAST   ezetimibe (ZETIA) 10 MG tablet TAKE 1 TABLET BY MOUTH  DAILY   FLUoxetine (PROZAC) 20 MG capsule TAKE 1 CAPSULE BY MOUTH  DAILY   HYDROcodone-acetaminophen (NORCO/VICODIN) 5-325 MG tablet 1 tablet, Oral, Daily at bedtime   Magnesium Oxide 400 mg, Oral, Daily   melatonin 5 mg, Oral, At bedtime PRN   methocarbamol (ROBAXIN) 500 MG tablet TAKE 1 TABLET BY MOUTH AT  BEDTIME AS NEEDED FOR  MUSCLE SPASM(S)   metoprolol  succinate (TOPROL-XL) 50 MG 24 hr tablet TAKE 1 AND 1/2 TABLETS BY  MOUTH DAILY TAKE WITH OR  IMMEDIATELY FOLLOWING A  MEAL   mirabegron ER (MYRBETRIQ) 50 mg, Oral, Daily   pantoprazole (PROTONIX) 40 mg, Oral, 2 times daily   rivaroxaban (XARELTO) 20 MG TABS tablet TAKE 1 TABLET BY MOUTH  DAILY WITH SUPPER   sildenafil (VIAGRA) 100 mg, Oral, Daily PRN   vitamin B-12 (CYANOCOBALAMIN) 1,000 mcg, Oral, Daily       Objective:   Physical Exam BP 126/62   Pulse 62   Temp 97.7 F (36.5 C) (Oral)   Resp 18   Ht '6\' 6"'$  (1.981 m)   Wt 245 lb 8 oz (111.4 kg)   SpO2 98%   BMI 28.37 kg/m  General:   Well developed, NAD, BMI noted. HEENT:  Normocephalic . Face symmetric, atraumatic Lungs:  CTA B Normal respiratory effort, no intercostal retractions, no accessory muscle use. Heart: RRR,  no murmur.  Lower extremities: no pretibial edema bilaterally  Skin: Not pale. Not  jaundice Neurologic:  alert & oriented X3.  Speech normal, gait appropriate for age and unassisted Psych--  Cognition and judgment appear intact.  Cooperative with normal attention span and concentration.  Behavior appropriate. No anxious or depressed appearing.  Seems to be in good spirits     Assessment     Assessment DM (A1C increased to 6.8 on 12/2017) Peripheral neuropathy (no pain, insensitive type) HTN Hyperlipidemia Anxiety depression -- on xanax  Chronic rhinitis:  Sees ENT CV: Dr Stanford Breed --CAD. Stress test okay 05-2015 -- Paroxysmal Atrial fibrillation --TIA --Abdominal bruit: No AAA. Ultrasound 05/2015 PULM:  OSA - + sleep study 03-2015 ,Cpap  intolerant   COPD: smoker, CT chest 12/2017 :changes noted   GU: BPH, Hypogonadism, ED. Sees urology B12 and vitamin D deficiency Pain mngmt: has back-shoulder  pain and neuropathy  [place]oma switch to methocarbamol 08-2020, on  Vicodin.  Etodolac DC 2016  per Dr Larose Kells d/t concerns regards anticoagulation. Hydrocodone dose needed to be increased Has seen Dr. Erling Cruz several times since 2010:w/u  included SPE, ACE, TSH, B12 (slightly low with a normal methylmalonic acid) 08-2008 had a lumbar spine MRI with multilevel spondylosis. Dr. 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  Currently on Jardiance.  Doing even better with lifestyle.  Check A1c HTN: BP is 126/62 today, continue metoprolol.  Check BMP. Hyperlipidemia: Last LDL satisfactory.  On Lipitor and Zetia. R breast pain: See LOV. Had a  MMG & [place]: bilateral mild gynecomastia with no evidence of malignancy.  Symptoms are much less noticeable.  We agreed that he will do self breast exam.   A-fib, CAD, history of TIA: Asx, anticoagulated, plan is to control CV RFs Social: Moving to [facility], he already has an  appointment to see a new PCP August 2023.  He also found a new cardiologist. RTC as needed

## 2021-11-26 LAB — BASIC METABOLIC PANEL
BUN: 14 mg/dL (ref 6–23)
CO2: 27 mEq/L (ref 19–32)
Calcium: 9.4 mg/dL (ref 8.4–10.5)
Chloride: 105 mEq/L (ref 96–112)
Creatinine, Ser: 0.84 mg/dL (ref 0.40–1.50)
GFR: 86.97 mL/min (ref >60.00)
Glucose, Bld: 97 mg/dL (ref 70–99)
Potassium: 4.9 mEq/L (ref 3.5–5.1)
Sodium: 139 mEq/L (ref 135–145)

## 2021-11-26 LAB — HEMOGLOBIN A1C: Hgb A1c MFr Bld: 6.4 % (ref 4.6–6.5)

## 2021-11-26 NOTE — Assessment & Plan Note (Signed)
DM, last A1c decreased from 7.0 to  6.6.  Currently on Jardiance.  Doing even better with lifestyle.  Check A1c HTN: BP is 126/62 today, continue metoprolol.  Check BMP. Hyperlipidemia: Last LDL satisfactory.  On Lipitor and Zetia. R breast pain: See LOV. Had a  MMG & [place]: bilateral mild gynecomastia with no evidence of malignancy.  Symptoms are much less noticeable.  We agreed that he will do self breast exam.   A-fib, CAD, history of TIA: Asx, anticoagulated, plan is to control CV RFs Social: Moving to [facility], he already has an appointment to see a new PCP August 2023.  He also found a new cardiologist. RTC as needed

## 2021-12-05 DIAGNOSIS — E785 Hyperlipidemia, unspecified: Secondary | ICD-10-CM

## 2021-12-05 DIAGNOSIS — F341 Dysthymic disorder: Secondary | ICD-10-CM

## 2021-12-05 DIAGNOSIS — I10 Essential (primary) hypertension: Secondary | ICD-10-CM

## 2021-12-05 DIAGNOSIS — I25118 Atherosclerotic heart disease of native coronary artery with other forms of angina pectoris: Secondary | ICD-10-CM

## 2021-12-05 DIAGNOSIS — J449 Chronic obstructive pulmonary disease, unspecified: Secondary | ICD-10-CM

## 2021-12-05 DIAGNOSIS — E118 Type 2 diabetes mellitus with unspecified complications: Secondary | ICD-10-CM

## 2021-12-11 ENCOUNTER — Telehealth: Payer: Self-pay | Admitting: Internal Medicine

## 2021-12-11 NOTE — Telephone Encounter (Signed)
PDMP okay, Rx sent 

## 2021-12-11 NOTE — Telephone Encounter (Signed)
Requesting: norco/ vicodin 5-325 mg Contract:08/07/20 UDS:07/18/21 Last Visit:11/25/21 Next Visit:unknown Last Refill:08/06/21  Please Advise

## 2021-12-12 ENCOUNTER — Ambulatory Visit (INDEPENDENT_AMBULATORY_CARE_PROVIDER_SITE_OTHER): Payer: Medicare Other

## 2021-12-12 DIAGNOSIS — I10 Essential (primary) hypertension: Secondary | ICD-10-CM

## 2021-12-12 NOTE — Chronic Care Management (AMB) (Signed)
Care Management    RN Visit Note  12/12/2021 Name: Brendan [name] MRN: [id number] DOB: Apr 03, 1951  Subjective: Brendan [name] is a 71 y.o. year old male who is a primary care patient of Colon Branch, MD. The care management team was consulted for assistance with disease management and care coordination needs.    Engaged with patient by telephone for follow up visit in response to provider referral for case management and/or care coordination services.   Consent to Services:   [name] was given information about Care Management services today including:  Care Management services includes personalized support from designated clinical staff supervised by his physician, including individualized plan of care and coordination with other care providers 24/7 contact phone numbers for assistance for urgent and routine care needs. The patient may stop case management services at any time by phone call to the office staff.  Patient agreed to services and consent obtained.   Assessment: Review of patient past medical history, allergies, medications, health status, including review of consultants reports, laboratory and other test data, was performed as part of comprehensive evaluation and provision of chronic care management services.   SDOH (Social Determinants of Health) assessments and interventions performed:    Care Plan  Allergies  Allergen Reactions   Tussionex Pennkinetic Er [Hydrocod Poli-Chlorphe Poli Er] Hives    No current issues, only had issues when he was 71 years old   Albuterol Palpitations   Qvar [Beclomethasone] Palpitations    Outpatient Encounter Medications as of 12/12/2021  Medication Sig   acetaminophen (TYLENOL) 500 MG tablet Take 500 mg by mouth every 6 (six) hours as needed.   ALPRAZolam (XANAX) 0.5 MG tablet TAKE 1 TABLET BY MOUTH TWICE  DAILY AS NEEDED FOR ANXIETY   atorvastatin (LIPITOR) 80 MG tablet TAKE 1 TABLET BY MOUTH  DAILY   chlorpheniramine  (CHLOR-TRIMETON) 4 MG tablet Take 8 mg by mouth daily as needed for allergies or rhinitis.    Cholecalciferol (VITAMIN D3) 50 MCG (2000 UT) capsule Take 1 capsule by mouth daily.   Cobalamin Combinations (B-12) [phone] MCG SUBL Take 0.5 tablets by mouth daily.   empagliflozin (JARDIANCE) 10 MG TABS tablet TAKE 1 TABLET BY MOUTH  DAILY BEFORE BREAKFAST   ezetimibe (ZETIA) 10 MG tablet TAKE 1 TABLET BY MOUTH  DAILY   FLUoxetine (PROZAC) 20 MG capsule TAKE 1 CAPSULE BY MOUTH  DAILY   HYDROcodone-acetaminophen (NORCO/VICODIN) 5-325 MG tablet Take 1 tablet by mouth at bedtime.   Magnesium Oxide 400 MG CAPS Take 400 mg by mouth daily.   melatonin 5 MG TABS Take 5 mg by mouth at bedtime as needed.   methocarbamol (ROBAXIN) 500 MG tablet TAKE 1 TABLET BY MOUTH AT  BEDTIME AS NEEDED FOR  MUSCLE SPASM(S)   metoprolol succinate (TOPROL-XL) 50 MG 24 hr tablet TAKE 1 AND 1/2 TABLETS BY  MOUTH DAILY TAKE WITH OR  IMMEDIATELY FOLLOWING A  MEAL   mirabegron ER (MYRBETRIQ) 50 MG TB24 tablet Take 50 mg by mouth daily.   pantoprazole (PROTONIX) 40 MG tablet Take 40 mg by mouth 2 (two) times daily.   rivaroxaban (XARELTO) 20 MG TABS tablet TAKE 1 TABLET BY MOUTH  DAILY WITH SUPPER   sildenafil (VIAGRA) 100 MG tablet Take 100 mg by mouth daily as needed for erectile dysfunction.   vitamin B-12 (CYANOCOBALAMIN) 1000 MCG tablet Take 1,000 mcg by mouth daily.   No facility-administered encounter medications on file as of 12/12/2021.    Patient Active  Problem List   Diagnosis Date Noted   Insomnia 08/08/2020   Nonintractable episodic headache 12/14/2019   Controlled diabetes mellitus type 2 with complications ([place]) 85/27/7824   COPD mixed type ([place]) 04/18/2018   OSA (obstructive sleep apnea) 10/16/2015   Bruit 04/24/2015   PCP NOTES >>>>>> 02/25/2015   Hx of colonic polyps 06/25/2014   Sinus bradycardia 06/19/2014   Annual physical exam >>>>>>>>>>>>>>>>>> 04/28/2014   Ejection fraction    GERD  (gastroesophageal reflux disease) 02/11/2011   Paroxysmal atrial flutter (HCC)    HTN (hypertension)    CAD (coronary artery disease)    Dyslipidemia    Obesity    B12 deficiency    Vitamin D deficiency 05/20/2009   Polyneuropathy 09/10/2008   BACK PAIN, LUMBAR--UDS 08/03/2008   ERECTILE DYSFUNCTION 02/09/2008   BPH (benign prostatic hyperplasia) 02/09/2008   COLONIC POLYPS 07/23/2007   CIGARETTE SMOKER 07/23/2007   Solitary pulmonary nodule-chest x-ray stable 08-2014, no further schedule imaging 07/23/2007   DIVERTICULOSIS OF COLON 07/23/2007   ANXIETY DEPRESSION 07/08/2007    Conditions to be addressed/monitored: HTN and HLD  Care Plan : RN Care Manager Plan of Care  Updates made by Luretha Rued, RN since 12/12/2021 12:00 AM  Completed 12/12/2021   Problem: No Plan of Care Established for Managment of Chronic Disease Resolved 12/12/2021  Priority: High     Long-Range Goal: Development of Plan of Care for Chronic Disease Managment and/or Care coordination needs Completed 12/12/2021  Start Date: 10/02/2021  Expected End Date: 04/03/2022  Priority: High  Note:   Case Closed: goals met Current Barriers: RNCM spoke with patient. He denies any questions or concerns. He reports transitioning to [place] on 12/29/21. He reports his financial situation has improved and states, "it has worked out real well".  Chronic Disease Management support and education needs related to HTN  RNCM Clinical Goal(s):  Patient will verbalize understanding of plan for management of HTN as evidenced by blood pressure within  demonstrate ongoing adherence to prescribed treatment plan for HTN as evidenced by BP readings, attending scheduled appointments, taking medication as prescribed  through collaboration with RN Care manager, provider, and care team.   Interventions: 1:1 collaboration with primary care provider regarding development and update of comprehensive plan of care as evidenced by provider  attestation and co-signature Inter-disciplinary care team collaboration (see longitudinal plan of care) Evaluation of current treatment plan related to  self management and patient's adherence to plan as established by provider  Community Resource Interventions: (Status:  Goal Met.)  Long Term Goal Care Guide referral for food insecurity, community resources Collaboration with Care Guide and LCSW   Hypertension Interventions:  (Status:  Goal Met.) Long Term Goal Last practice recorded BP readings:  BP Readings from Last 3 Encounters:  11/25/21 126/62  07/18/21 118/90  06/23/21 110/62  Most recent eGFR/CrCl: No results found for: EGFR  No components found for: CRCL  Evaluation of current treatment plan related to hypertension self management and patient's adherence to plan as established by provider Provided education to patient re: stroke prevention, s/s of heart attack and stroke Reviewed medications with patient and discussed importance of compliance   Hyperlipidemia Interventions:  (Status:  Goal Met.) Long Term Goal Continues to eat healthy; avoid trasnsfats, saturated fats and processed foods. Medication review performed; medication list updated in electronic medical record.  Counseled on importance of regular laboratory monitoring as prescribed Provided HLD educational materials   Patient Goals/Self-Care Activities: Take medications as prescribed   Attend  all scheduled provider appointments Call pharmacy for medication refills 3-7 days in advance of running out of medications report new symptoms to your doctor eat more whole grains, fruits and vegetables, lean meats and healthy fats   Plan: No further follow up required.   Thea Silversmith, RN, MSN, BSN, CCM Care Management Coordinator [facility] [phone]

## 2021-12-12 NOTE — Patient Instructions (Addendum)
Visit Information  Thank you for allowing me to share the care management and care coordination services that are available to you as part of your health plan and services through your primary care provider and medical home. Please reach out to your primary care provider or me at [phone] if the your care management/care coordination team may be of assistance to you in the future.   Thea Silversmith, RN, MSN, BSN, CCM Care Management Coordinator [facility] [phone]

## 2021-12-18 ENCOUNTER — Other Ambulatory Visit: Payer: Self-pay | Admitting: Internal Medicine

## 2021-12-20 ENCOUNTER — Other Ambulatory Visit: Payer: Self-pay | Admitting: Internal Medicine

## 2022-01-02 ENCOUNTER — Other Ambulatory Visit: Payer: Self-pay | Admitting: Internal Medicine

## 2022-01-02 DIAGNOSIS — E785 Hyperlipidemia, unspecified: Secondary | ICD-10-CM

## 2022-01-05 DIAGNOSIS — I10 Essential (primary) hypertension: Secondary | ICD-10-CM

## 2022-01-09 ENCOUNTER — Telehealth: Payer: Self-pay | Admitting: Acute Care

## 2022-01-09 NOTE — Telephone Encounter (Signed)
FYI to the provider.  

## 2022-01-12 NOTE — Telephone Encounter (Signed)
Pt has no scheduled appointments with [place] at this time. Nothing further needed

## 2022-01-13 ENCOUNTER — Telehealth: Payer: Self-pay | Admitting: Family Medicine

## 2022-01-13 DIAGNOSIS — F341 Dysthymic disorder: Secondary | ICD-10-CM

## 2022-01-13 NOTE — Telephone Encounter (Signed)
Last OV---11/25/2021 Last RF--10/24/2021  #180 UDS--07/18/2021 CSC--08/27/20

## 2022-01-13 NOTE — Telephone Encounter (Signed)
PDMP okay, Rx sent 

## 2022-01-13 NOTE — Telephone Encounter (Signed)
Pt has NP appt w/ new internist on 02/02/22- will you refill until then?

## 2022-02-26 ENCOUNTER — Other Ambulatory Visit: Payer: Self-pay | Admitting: Internal Medicine

## 2022-02-26 DIAGNOSIS — R739 Hyperglycemia, unspecified: Secondary | ICD-10-CM

## 2022-03-08 ENCOUNTER — Other Ambulatory Visit: Payer: Self-pay | Admitting: Internal Medicine

## 2022-03-08 DIAGNOSIS — E785 Hyperlipidemia, unspecified: Secondary | ICD-10-CM

## 2022-03-11 ENCOUNTER — Ambulatory Visit (HOSPITAL_BASED_OUTPATIENT_CLINIC_OR_DEPARTMENT_OTHER): Payer: Medicare Other

## 2022-03-20 ENCOUNTER — Other Ambulatory Visit: Payer: Self-pay | Admitting: Internal Medicine

## 2022-04-07 ENCOUNTER — Other Ambulatory Visit: Payer: Self-pay | Admitting: Internal Medicine

## 2022-04-07 DIAGNOSIS — F341 Dysthymic disorder: Secondary | ICD-10-CM

## 2022-04-15 ENCOUNTER — Other Ambulatory Visit: Payer: Self-pay | Admitting: Gastroenterology

## 2022-04-15 ENCOUNTER — Other Ambulatory Visit: Payer: Self-pay | Admitting: Internal Medicine

## 2022-04-29 ENCOUNTER — Other Ambulatory Visit: Payer: Self-pay | Admitting: Internal Medicine

## 2022-06-12 ENCOUNTER — Other Ambulatory Visit: Payer: Self-pay | Admitting: Internal Medicine

## 2022-06-12 DIAGNOSIS — E785 Hyperlipidemia, unspecified: Secondary | ICD-10-CM

## 2022-06-12 DIAGNOSIS — R739 Hyperglycemia, unspecified: Secondary | ICD-10-CM

## 2022-06-17 ENCOUNTER — Other Ambulatory Visit: Payer: Self-pay | Admitting: Gastroenterology

## 2022-07-20 ENCOUNTER — Telehealth: Payer: Self-pay | Admitting: Gastroenterology

## 2022-07-20 NOTE — Telephone Encounter (Signed)
Patient is calling states he is not sure if he still needs to be taking his Pantoprazole Rx. Please advise

## 2022-07-20 NOTE — Telephone Encounter (Signed)
The pt states that he has moved to [place] and wants to see if he should continue his PPI. He was told that he should continue but is due for a follow up.  He states he will find a GI in [place] and have them request records.

## 2022-09-06 ENCOUNTER — Other Ambulatory Visit: Payer: Self-pay | Admitting: Gastroenterology

## 2022-09-07 ENCOUNTER — Other Ambulatory Visit: Payer: Self-pay | Admitting: Internal Medicine

## 2022-09-17 ENCOUNTER — Telehealth: Payer: Self-pay | Admitting: Internal Medicine

## 2022-09-17 NOTE — Telephone Encounter (Signed)
Contacted Brendan [name] to schedule their annual wellness visit. Patient declined to schedule AWV at this time.patient moved to [facility]   Verlee Rossetti; Care Guide Ambulatory Clinical Support  l [facility] Direct Dial: [phone]

## 2023-05-31 ENCOUNTER — Other Ambulatory Visit: Payer: Self-pay | Admitting: Urology

## 2023-06-07 ENCOUNTER — Other Ambulatory Visit: Payer: Self-pay | Admitting: Urology
